# Patient Record
Sex: Female | Born: 1970 | Race: Black or African American | Hispanic: No | Marital: Married | Smoking: Never smoker
Health system: Southern US, Community
[De-identification: ages and names within clinical notes are randomized; demographics above are authoritative.]

## PROBLEM LIST (undated history)

## (undated) DIAGNOSIS — Z86718 Personal history of other venous thrombosis and embolism: Secondary | ICD-10-CM

## (undated) DIAGNOSIS — F32A Depression, unspecified: Secondary | ICD-10-CM

## (undated) DIAGNOSIS — D573 Sickle-cell trait: Secondary | ICD-10-CM

## (undated) DIAGNOSIS — G629 Polyneuropathy, unspecified: Secondary | ICD-10-CM

## (undated) DIAGNOSIS — C50411 Malignant neoplasm of upper-outer quadrant of right female breast: Secondary | ICD-10-CM

## (undated) DIAGNOSIS — D649 Anemia, unspecified: Secondary | ICD-10-CM

## (undated) DIAGNOSIS — Z85528 Personal history of other malignant neoplasm of kidney: Secondary | ICD-10-CM

## (undated) DIAGNOSIS — F329 Major depressive disorder, single episode, unspecified: Secondary | ICD-10-CM

## (undated) DIAGNOSIS — I890 Lymphedema, not elsewhere classified: Secondary | ICD-10-CM

## (undated) HISTORY — PX: MASTECTOMY: SHX3

## (undated) HISTORY — PX: TONSILLECTOMY: SUR1361

## (undated) HISTORY — PX: OTHER SURGICAL HISTORY: SHX169

## (undated) HISTORY — DX: Malignant neoplasm of upper-outer quadrant of right female breast: C50.411

## (undated) HISTORY — PX: TUBAL LIGATION: SHX77

---

## 2000-05-19 ENCOUNTER — Other Ambulatory Visit: Admission: RE | Admit: 2000-05-19 | Discharge: 2000-05-19 | Payer: Self-pay | Admitting: *Deleted

## 2001-01-01 ENCOUNTER — Inpatient Hospital Stay (HOSPITAL_COMMUNITY): Admission: AD | Admit: 2001-01-01 | Discharge: 2001-01-03 | Payer: Self-pay | Admitting: Gynecology

## 2001-02-20 ENCOUNTER — Other Ambulatory Visit: Admission: RE | Admit: 2001-02-20 | Discharge: 2001-02-20 | Payer: Self-pay | Admitting: *Deleted

## 2005-04-13 ENCOUNTER — Emergency Department (HOSPITAL_COMMUNITY): Admission: EM | Admit: 2005-04-13 | Discharge: 2005-04-13 | Payer: Self-pay | Admitting: Emergency Medicine

## 2005-04-15 ENCOUNTER — Ambulatory Visit (HOSPITAL_COMMUNITY): Admission: RE | Admit: 2005-04-15 | Discharge: 2005-04-15 | Payer: Self-pay | Admitting: Obstetrics

## 2005-08-21 ENCOUNTER — Ambulatory Visit (HOSPITAL_COMMUNITY): Admission: RE | Admit: 2005-08-21 | Discharge: 2005-08-21 | Payer: Self-pay | Admitting: Obstetrics & Gynecology

## 2005-09-17 ENCOUNTER — Inpatient Hospital Stay (HOSPITAL_COMMUNITY): Admission: AD | Admit: 2005-09-17 | Discharge: 2005-09-20 | Payer: Self-pay | Admitting: Obstetrics & Gynecology

## 2007-10-06 ENCOUNTER — Emergency Department (HOSPITAL_COMMUNITY): Admission: EM | Admit: 2007-10-06 | Discharge: 2007-10-06 | Payer: Self-pay | Admitting: Family Medicine

## 2010-12-14 ENCOUNTER — Encounter: Payer: Self-pay | Admitting: Obstetrics & Gynecology

## 2010-12-26 ENCOUNTER — Other Ambulatory Visit: Payer: Self-pay | Admitting: Obstetrics & Gynecology

## 2010-12-26 ENCOUNTER — Encounter (INDEPENDENT_AMBULATORY_CARE_PROVIDER_SITE_OTHER): Payer: Medicaid Other | Admitting: Obstetrics and Gynecology

## 2010-12-26 DIAGNOSIS — Z124 Encounter for screening for malignant neoplasm of cervix: Secondary | ICD-10-CM

## 2010-12-26 DIAGNOSIS — Z3009 Encounter for other general counseling and advice on contraception: Secondary | ICD-10-CM

## 2011-01-04 NOTE — H&P (Signed)
Phyllis Pruitt, DUGGAR NO.:  [id number]  MEDICAL RECORD NO.:  [id number]           PATIENT TYPE:  A  LOCATION:  [facility]                   FACILITY:  [facility]  PHYSICIAN:  Scheryl Darter, MD       DATE OF BIRTH:  12/19/1970  DATE OF SERVICE:                          PRE-OP HISTORY & PHYSICAL  The patient wishes to schedule permanent sterilization.  The patient is a 40 year old black female, gravida 4, para 4-0-0-4, last menstrual period December 22, 2010, with regular menses, uses condoms for birth control.  She wants to schedule a permanent sterilization via laparoscopy.  She has used Mirena in the past and does not wish to have that again.  She does not plan future childbearing.  Her children are between ages of 78 and 30.  PAST MEDICAL HISTORY:  None.  PAST SURGICAL HISTORY:  None.  FAMILY HISTORY:  Unremarkable.  SOCIAL HISTORY:  The patient exercises regularly.  No smoking, drinking, or alcohol use.  She works as a Technical brewer.  ALLERGIES:  No known drug allergies.  No latex allergy.  MEDICATIONS: 1. Iron 18 mg a day. 2. Multivitamins daily, 3. Flaxseed oil one capsule daily.  REVIEW OF SYSTEMS:  No bleeding, abnormal discharge, or pain.  She had some voluntary weight loss over the last year of about 15 pounds.  PHYSICAL EXAMINATION:  GENERAL:  In no acute distress.  Normal affect. VITAL SIGNS:  Weight 170 pounds, height 65 inches, blood pressure 140/86, pulse 99, temperature 98.2. CHEST:  Clear. HEART:  Regular rhythm. BREASTS:  Symmetric.  No masses, tenderness, or axillary lymphadenopathy. ABDOMEN:  Flat, soft, nontender.  No mass. GENITOURINARY:  External genitalia, vagina, and cervix appeared normal. Pap was done.  Uterus normal size, nontender.  No mass. EXTREMITIES:  No swelling or deformity.  IMPRESSION:  Undesired fertility.  PLAN:  We discussed laparoscopic tubal sterilization.  The risk of failure 1:200 including risk of ectopic  pregnancy was discussed.  The risks of anesthesia, bleeding, infection, pain, or bowel or urinary tract damage were discussed.  Her questions were answered.  The procedure will be scheduled as an outpatient.  She will sign consent for Medicaid today.     Scheryl Darter, MD    JA/MEDQ  D:  12/26/2010  T:  12/26/2010  Job:  [id number]

## 2011-02-13 ENCOUNTER — Ambulatory Visit (HOSPITAL_COMMUNITY)
Admission: RE | Admit: 2011-02-13 | Discharge: 2011-02-13 | Disposition: A | Discharge status: Home / Self Care | Payer: Medicaid Other | Source: Ambulatory Visit | Attending: Obstetrics & Gynecology | Admitting: Obstetrics & Gynecology

## 2011-02-13 DIAGNOSIS — Z302 Encounter for sterilization: Secondary | ICD-10-CM

## 2011-02-13 LAB — CBC
HCT: 36.4 % (ref 36.0–46.0)
Hemoglobin: 12.2 g/dL (ref 12.0–15.0)
MCH: 27.5 pg (ref 26.0–34.0)
MCV: 82 fL (ref 78.0–100.0)
Platelets: 252 10*3/uL (ref 150–400)
RBC: 4.44 MIL/uL (ref 3.87–5.11)
WBC: 6.4 10*3/uL (ref 4.0–10.5)

## 2011-02-13 LAB — SURGICAL PCR SCREEN
MRSA, PCR: NEGATIVE
Staphylococcus aureus: NEGATIVE

## 2011-02-22 NOTE — Op Note (Signed)
  Phyllis Pruitt, Phyllis Pruitt NO.:  [id number]  MEDICAL RECORD NO.:  [id number]           PATIENT TYPE:  O  LOCATION:  [facility]                          [facility]  PHYSICIAN:  Scheryl Darter, MD       DATE OF BIRTH:  24-Jun-1971  DATE OF PROCEDURE:  02/13/2011 DATE OF DISCHARGE:                              OPERATIVE REPORT   PROCEDURE:  Laparoscopic bilateral tubal ligation.  PREOPERATIVE DIAGNOSIS:  Undesired fertility.  POSTOPERATIVE DIAGNOSIS:  Tubal sterilization.  SURGEON:  Scheryl Darter, MD  ANESTHESIA:  General.  ESTIMATED BLOOD LOSS:  Minimal.  SPECIMENS:  None.  COMPLICATIONS:  None.  DRAINS:  None.  COUNTS:  Correct.  OPERATIVE COURSE:  The patient gave written consent for laparoscopic bilateral tubal ligation.  The patient identification was confirmed and she was brought to the OR, and general anesthesia was induced.  She was placed in dorsal lithotomy position.  Abdomen and perineum and vaginawere sterilely prepped and draped, and the bladder was drained with a red rubber catheter.  Hulka tenaculum was placed on the cervix.  A #11 blade was used to make a transverse infraumbilical skin incision about 1.5 cm across.  The abdominal wall was elevated and Veress needle was placed, and CO2 was insufflated.  Needle was then removed, and the 11-mm trocar was placed through the incision.  Laparoscope was inserted with video camera in use.  Panoramic view of the pelvis was obtained.  The uterus was manipulated with a Hulka tenaculum.  Tubes and ovaries appeared normal.  Filshie clip was placed on the right tube with good positioning seen after identifying the distal fimbriated end.  Same was done on the left tube. The instruments were removed and CO2 was released.  A 0 Vicryl suture was placed in the fascia at the umbilicus, and skin was closed with interrupted subcuticular sutures of 4-0 Vicryl.  Sterile dressing was applied.  The patient tolerated the  procedure well without complications.  She was brought in stable condition to the recovery room.     Scheryl Darter, MD     JA/MEDQ  D:  02/13/2011  T:  02/13/2011  Job:  [id number]  Electronically Signed by Scheryl Darter MD on 02/22/2011 10:29:35 AM

## 2011-03-01 NOTE — H&P (Signed)
[facility]  Patient:    Phyllis Pruitt, HERST                       MRN: [id number] Adm. Date:  12/29/00 Attending:  Katy Fitch, M.D.                         History and Physical  HISTORY:                      The patient is a 40 year old, G3, P2, at 41-5/7 weeks, who is admitted for induction.  The patient was checked in the office on December 29, 2000, and noted to be 2 cm, 50% effaced, and -2.  The patient has had no complaints of decreased fetal movement, vaginal bleeding, rupture of membranes, headache, nausea, vomiting, or change in bowel or bladder habits.  PAST MEDICAL HISTORY:         Sickle cell trait.  PAST SURGICAL HISTORY:        None.  PAST OBSTETRICAL HISTORY:     Two term deliveries at 42 weeks, one 6 pounds 13 ounces, one 8 pounds 1 ounce, by vaginal.  PAST GYNECOLOGICAL HISTORY:   No history of abnormal Pap smear or STDs.  MEDICATIONS:                  Prenatal vitamins.  ALLERGIES:                    None.  SOCIAL HISTORY:               Without any tobacco, alcohol, or drugs.  FAMILY HISTORY:               Without any epithelial cancers.  PRENATAL LABORATORY:          O positive, rubella immune, GBS negative.  PHYSICAL EXAMINATION:  VITAL SIGNS:                  Blood pressure 110/70.  HEENT:                        Throat clear.  LUNGS:                        Clear to auscultation bilaterally.  HEART:                        Regular rate and rhythm without any murmurs, rubs, or gallops.  ABDOMEN:                      Gravid, nontender.  Estimated fetal weight 7 pounds 12 ounces.  PELVIC:                       Cervix 2, 50, -2.  ASSESSMENT/PLAN:              A 40 year old G3, P2 at 42-5/7 weeks, induction for postdates.  Will have Pitocin and artificial rupture of membranes. DD:  12/29/00 TD:  12/29/00 Job: [id number]

## 2011-03-01 NOTE — H&P (Signed)
Phyllis Pruitt, Phyllis Pruitt             ACCOUNT NO.:  [id number]   MEDICAL RECORD NO.:  [id number]          PATIENT TYPE:  INP   LOCATION:  9167                          [facility]   PHYSICIAN:  Roseanna Rainbow, M.D.DATE OF BIRTH:  [birth year]   DATE OF ADMISSION:  09/17/2005  DATE OF DISCHARGE:                                HISTORY & PHYSICAL   CHIEF COMPLAINT:  The patient is a 40 year old para 3 with an estimated date  of confinement of September 21, 2005, with an intrauterine pregnancy at 39+  weeks, for induction of labor.   HISTORY OF PRESENT ILLNESS:  The patient has gestational diabetes, well  controlled on a diet. On the day of admission she presented to the office  and was found to have elevated blood pressures of 140/90. She did not have  any other symptoms consistent with severe PIH.   RISK FACTORS:  Please see the above as well as advanced maternal age,  anemia, sickle cell trait. There is a questionable bladder mass with  secondary hematuria and she is followed by a urologist for this problem. GBS  positive.   LABORATORY DATA:  Blood type is A positive, antibody screen negative.  Hemoglobin 8.9, platelet count 283,000. Quad screen within normal limits.  RPR nonreactive. Rubella immune. Sickle cell positive. Urine culture and  sensitivity - no growth.   ALLERGIES:  No known drug allergies.   MEDICATIONS:  Prenatal vitamins.   PAST GYN HISTORY:  No history of obstetrical or gynecological surgery.   PAST MEDICAL HISTORY:  No significant history of medical diseases.   PAST SURGICAL HISTORY:  No previous surgery.   SOCIAL HISTORY:  Does not give any significant history of alcohol usage. Has  no significant smoking history.   FAMILY HISTORY:  No illnesses known.   PAST OBSTETRIC HISTORY:  She is status post 3 previous normal spontaneous  vaginal deliveries.   PHYSICAL EXAMINATION:  VITAL SIGNS:  Stable. Afebrile. Blood pressure  140/90.  GENERAL:   Well-developed, well-nourished, in no apparent distress.  ABDOMEN:  Gravid.  STERILE VAGINAL EXAM:  In the office the cervix is 1-2 cm long, the  presenting part is high, the membranes were stretched. Doppler fetal heart  tones in the office: fetal heart rate 130's.   ASSESSMENT:  Multipara at term with gestational diabetes, diet controlled,  with rule out pregnancy induced hypertension, borderline Bishop's score, GBS  positive.   PLAN:  Admission. Induction of labor.  Will start with cervical ripening.      Roseanna Rainbow, M.D.  Electronically Signed     LAJ/MEDQ  D:  09/17/2005  T:  09/17/2005  Job:  [id number]

## 2011-03-01 NOTE — Discharge Summary (Signed)
[facility]  Patient:    Phyllis Pruitt, Phyllis Pruitt                     MRN: [id number] Adm. Date:  [id number] Disc. Date: [id number] Attending:  Wetzel Bjornstad Dictator:   Antony Contras, [facility]                           Discharge Summary  DISCHARGE DIAGNOSES:          1. Intrauterine pregnancy at 41-5/7 weeks.                               2. Positive sickle cell trait.  PROCEDURES:                   1. Induction of labor.                               2. Normal spontaneous vaginal delivery with                                  repair of second-degree midline episiotomy.  HISTORY OF PRESENT ILLNESS:   The patient is a 40 year old gravida 3, para 2-0-0-2, with LMP March 18, 2000, [facility] December 23, 2000.  Prenatal risk factors include a history of sickle cell trait.  PRENATAL LABORATORY DATA:     Blood type A positive, antibody screen negative. Sickle cell positive.  RPR, HBsAg, HIV nonreactive.  Rubella immune.  MSAFP normal.  GBS negative.  HOSPITAL COURSE:              The patient was admitted for induction of labor at 41-5/7 weeks.  On admission she was 2 cm, 50%, and -2.  Induction was initiated with artificial rupture of membranes and Pitocin.  She progressed to complete dilatation and delivered an Apgars 3, 4, and 8 female infant over a midline episiotomy.  Weight was 8 pounds 8 ounces.  Infant did have poor respiratory efforts, and the pediatric team was available for resuscitation. Also, there was a nuchal cord x 1 at delivery which was reduced.  Postpartum the patient did have a temperature elevation to 101, but then had normal temperature readings after that time.  CBC:  Hematocrit 27.6, hemoglobin 9.9, WBC 18.6, platelets 182.  She was able to be discharged with the baby in satisfactory condition on the second postpartum day.  FOLLOW-UP:                    In six weeks.  MEDICATIONS:                  Continue with prenatal vitamins and iron. Motrin  for pain. DD:  01/19/01 TD:  01/20/01 Job: [id number]

## 2011-03-13 ENCOUNTER — Ambulatory Visit: Payer: Medicaid Other | Admitting: Obstetrics & Gynecology

## 2011-03-13 DIAGNOSIS — Z09 Encounter for follow-up examination after completed treatment for conditions other than malignant neoplasm: Secondary | ICD-10-CM

## 2011-03-14 NOTE — Group Therapy Note (Signed)
NAMECARLITA, WHITCOMB NO.:  [id number]  MEDICAL RECORD NO.:  [id number]           PATIENT TYPE:  A  LOCATION:  [facility]                   FACILITY:  [facility]  PHYSICIAN:  Allie Bossier, MD        DATE OF BIRTH:  25-Sep-1971  DATE OF SERVICE:                                 CLINIC NOTE  [name] is a 40 year old lady who is now 4 weeks postop status post tubal ligation via laparoscopy.  She has had intercourse since her surgery and has had no pain.  Her bowel and bladder function is normal. Please note that she had her tubal done at that time she was on her menses.  Her only complaint today is that of a left "ear infection" x4 days.  On exam, her laparoscopic umbilical incision is well healed.  Her both canals of her ear are slightly erythematous but the TM on her left ear is very cloudy consistent with otitis media.  I am treating her with Augmentin 875 mg b.i.d. for 7 days.  She will come back for her Pap smear which will be due in a year or sooner as necessary.     Allie Bossier, MD    MCD/MEDQ  D:  03/13/2011  T:  03/14/2011  Job:  [id number]

## 2011-07-26 ENCOUNTER — Inpatient Hospital Stay (INDEPENDENT_AMBULATORY_CARE_PROVIDER_SITE_OTHER)
Admission: RE | Admit: 2011-07-26 | Discharge: 2011-07-26 | Disposition: A | Discharge status: Home / Self Care | Payer: Self-pay | Source: Ambulatory Visit | Attending: Emergency Medicine | Admitting: Emergency Medicine

## 2011-07-26 DIAGNOSIS — M799 Soft tissue disorder, unspecified: Secondary | ICD-10-CM

## 2011-07-26 DIAGNOSIS — S060X0A Concussion without loss of consciousness, initial encounter: Secondary | ICD-10-CM

## 2015-01-09 ENCOUNTER — Other Ambulatory Visit: Payer: Self-pay

## 2015-01-09 DIAGNOSIS — R5381 Other malaise: Secondary | ICD-10-CM

## 2015-01-24 ENCOUNTER — Other Ambulatory Visit: Payer: Self-pay | Admitting: Radiology

## 2015-01-24 DIAGNOSIS — C50911 Malignant neoplasm of unspecified site of right female breast: Secondary | ICD-10-CM

## 2015-01-25 ENCOUNTER — Telehealth: Payer: Self-pay | Admitting: *Deleted

## 2015-01-25 NOTE — Telephone Encounter (Signed)
Left vm for pt to return call to schedule [facility] on 4/20

## 2015-01-27 ENCOUNTER — Telehealth: Payer: Self-pay | Admitting: *Deleted

## 2015-01-27 ENCOUNTER — Encounter: Payer: Self-pay | Admitting: *Deleted

## 2015-01-27 DIAGNOSIS — C50411 Malignant neoplasm of upper-outer quadrant of right female breast: Secondary | ICD-10-CM

## 2015-01-27 HISTORY — DX: Malignant neoplasm of upper-outer quadrant of right female breast: C50.411

## 2015-01-27 NOTE — Telephone Encounter (Signed)
Confirmed [facility] for 02/01/15 at 0800 .  Instructions and contact information given.

## 2015-01-30 ENCOUNTER — Ambulatory Visit
Admission: RE | Admit: 2015-01-30 | Discharge: 2015-01-30 | Disposition: A | Discharge status: Home / Self Care | Payer: 59 | Source: Ambulatory Visit | Attending: Radiology | Admitting: Radiology

## 2015-01-30 DIAGNOSIS — C50911 Malignant neoplasm of unspecified site of right female breast: Secondary | ICD-10-CM

## 2015-01-30 MED ORDER — GADOBENATE DIMEGLUMINE 529 MG/ML IV SOLN
16.0000 mL | Freq: Once | INTRAVENOUS | Status: AC | PRN
  Administered 2015-01-30: 16 mL via INTRAVENOUS

## 2015-01-31 ENCOUNTER — Telehealth: Payer: Self-pay | Admitting: *Deleted

## 2015-01-31 NOTE — Telephone Encounter (Signed)
Received notification that pt will not be able to come to [facility] d/t insurance. Pt must meet with PCP who then sends a referral over prior to her being seen. R/s pt for [facility] on 02/08/15 at 8:00AM.

## 2015-02-01 ENCOUNTER — Ambulatory Visit: Payer: Medicaid Other

## 2015-02-01 ENCOUNTER — Ambulatory Visit: Payer: Medicaid Other | Admitting: Oncology

## 2015-02-01 ENCOUNTER — Ambulatory Visit: Payer: 59 | Admitting: Radiation Oncology

## 2015-02-01 ENCOUNTER — Ambulatory Visit: Payer: Medicaid Other | Admitting: Physical Therapy

## 2015-02-01 ENCOUNTER — Other Ambulatory Visit: Payer: Medicaid Other

## 2015-02-08 ENCOUNTER — Ambulatory Visit: Payer: 59

## 2015-02-08 ENCOUNTER — Telehealth: Payer: Self-pay | Admitting: Hematology and Oncology

## 2015-02-08 ENCOUNTER — Ambulatory Visit: Payer: Self-pay | Admitting: Surgery

## 2015-02-08 ENCOUNTER — Ambulatory Visit (HOSPITAL_BASED_OUTPATIENT_CLINIC_OR_DEPARTMENT_OTHER): Payer: 59 | Admitting: Hematology and Oncology

## 2015-02-08 ENCOUNTER — Other Ambulatory Visit: Payer: Self-pay | Admitting: Surgery

## 2015-02-08 ENCOUNTER — Encounter (INDEPENDENT_AMBULATORY_CARE_PROVIDER_SITE_OTHER): Payer: Self-pay

## 2015-02-08 ENCOUNTER — Encounter: Payer: Self-pay | Admitting: Medical Oncology

## 2015-02-08 ENCOUNTER — Encounter: Payer: Self-pay | Admitting: Skilled Nursing Facility1

## 2015-02-08 ENCOUNTER — Ambulatory Visit: Payer: 59 | Attending: Surgery | Admitting: Physical Therapy

## 2015-02-08 ENCOUNTER — Encounter: Payer: Self-pay | Admitting: Physical Therapy

## 2015-02-08 ENCOUNTER — Other Ambulatory Visit (HOSPITAL_BASED_OUTPATIENT_CLINIC_OR_DEPARTMENT_OTHER): Payer: 59

## 2015-02-08 ENCOUNTER — Encounter: Payer: Self-pay | Admitting: Hematology and Oncology

## 2015-02-08 ENCOUNTER — Ambulatory Visit
Admission: RE | Admit: 2015-02-08 | Discharge: 2015-02-08 | Disposition: A | Discharge status: Home / Self Care | Payer: 59 | Source: Ambulatory Visit | Attending: Radiation Oncology | Admitting: Radiation Oncology

## 2015-02-08 ENCOUNTER — Encounter: Payer: Self-pay | Admitting: *Deleted

## 2015-02-08 VITALS — BP 151/80 | HR 82 | Temp 98.1°F | Resp 18 | Ht 65.5 in | Wt 178.9 lb

## 2015-02-08 DIAGNOSIS — C50811 Malignant neoplasm of overlapping sites of right female breast: Secondary | ICD-10-CM

## 2015-02-08 DIAGNOSIS — C50411 Malignant neoplasm of upper-outer quadrant of right female breast: Secondary | ICD-10-CM | POA: Insufficient documentation

## 2015-02-08 DIAGNOSIS — R293 Abnormal posture: Secondary | ICD-10-CM | POA: Insufficient documentation

## 2015-02-08 DIAGNOSIS — C773 Secondary and unspecified malignant neoplasm of axilla and upper limb lymph nodes: Secondary | ICD-10-CM

## 2015-02-08 DIAGNOSIS — Z9189 Other specified personal risk factors, not elsewhere classified: Secondary | ICD-10-CM | POA: Diagnosis not present

## 2015-02-08 DIAGNOSIS — Z171 Estrogen receptor negative status [ER-]: Secondary | ICD-10-CM

## 2015-02-08 LAB — COMPREHENSIVE METABOLIC PANEL (CC13)
ALT: 11 U/L (ref 0–55)
AST: 14 U/L (ref 5–34)
Albumin: 3.6 g/dL (ref 3.5–5.0)
Alkaline Phosphatase: 61 U/L (ref 40–150)
Anion Gap: 10 mEq/L (ref 3–11)
BUN: 4.6 mg/dL — ABNORMAL LOW (ref 7.0–26.0)
CALCIUM: 8.9 mg/dL (ref 8.4–10.4)
CHLORIDE: 105 meq/L (ref 98–109)
CO2: 21 mEq/L — ABNORMAL LOW (ref 22–29)
Creatinine: 0.8 mg/dL (ref 0.6–1.1)
GLUCOSE: 100 mg/dL (ref 70–140)
Potassium: 3.9 mEq/L (ref 3.5–5.1)
Sodium: 136 mEq/L (ref 136–145)
Total Bilirubin: 0.31 mg/dL (ref 0.20–1.20)
Total Protein: 7.3 g/dL (ref 6.4–8.3)

## 2015-02-08 LAB — CBC WITH DIFFERENTIAL/PLATELET
BASO%: 0.5 % (ref 0.0–2.0)
Basophils Absolute: 0 10*3/uL (ref 0.0–0.1)
EOS%: 0.8 % (ref 0.0–7.0)
Eosinophils Absolute: 0.1 10*3/uL (ref 0.0–0.5)
HCT: 36.1 % (ref 34.8–46.6)
HEMOGLOBIN: 11.7 g/dL (ref 11.6–15.9)
LYMPH%: 20.3 % (ref 14.0–49.7)
MCH: 26.2 pg (ref 25.1–34.0)
MCHC: 32.5 g/dL (ref 31.5–36.0)
MCV: 80.7 fL (ref 79.5–101.0)
MONO#: 0.5 10*3/uL (ref 0.1–0.9)
MONO%: 7.8 % (ref 0.0–14.0)
NEUT#: 4.8 10*3/uL (ref 1.5–6.5)
NEUT%: 70.6 % (ref 38.4–76.8)
Platelets: 271 10*3/uL (ref 145–400)
RBC: 4.48 10*6/uL (ref 3.70–5.45)
RDW: 14.9 % — ABNORMAL HIGH (ref 11.2–14.5)
WBC: 6.8 10*3/uL (ref 3.9–10.3)
lymph#: 1.4 10*3/uL (ref 0.9–3.3)

## 2015-02-08 MED ORDER — PROCHLORPERAZINE MALEATE 10 MG PO TABS: 10.0000 mg | ORAL_TABLET | Freq: Four times a day (QID) | ORAL | Status: DC | PRN

## 2015-02-08 MED ORDER — LIDOCAINE-PRILOCAINE 2.5-2.5 % EX CREA: TOPICAL_CREAM | CUTANEOUS | Status: DC

## 2015-02-08 MED ORDER — ONDANSETRON HCL 8 MG PO TABS: 8.0000 mg | ORAL_TABLET | Freq: Two times a day (BID) | ORAL | Status: DC | PRN

## 2015-02-08 MED ORDER — DEXAMETHASONE 4 MG PO TABS: ORAL_TABLET | ORAL | Status: DC

## 2015-02-08 MED ORDER — LORAZEPAM 0.5 MG PO TABS: 0.5000 mg | ORAL_TABLET | Freq: Every day | ORAL | Status: DC

## 2015-02-08 NOTE — Therapy (Signed)
[place], [place], [address] Phone: [phone]   Fax:  [phone]  Physical Therapy Evaluation  Patient Details  Name: Phyllis Pruitt MRN: [id number] Date of Birth: 12-16-1970 Referring Provider:  Alphonsa Overall, MD  Encounter Date: 02/08/2015      PT End of Session - 02/08/15 1314    Visit Number 1   Number of Visits 1   PT Start Time 1030   PT Stop Time 1052   PT Time Calculation (min) 22 min   Activity Tolerance Patient tolerated treatment well   Behavior During Therapy [facility] for tasks assessed/performed      Past Medical History  Diagnosis Date  . Breast cancer of upper-outer quadrant of right female breast 01/27/2015    Past Surgical History  Procedure Laterality Date  . Tubal ligation    . Tonsillectomy    . [place] teeth extraction      There were no vitals filed for this visit.  Visit Diagnosis:  Carcinoma of upper-outer quadrant of right female breast - Plan: PT plan of care cert/re-cert  At risk for lymphedema - Plan: PT plan of care cert/re-cert  Abnormal posture - Plan: PT plan of care cert/re-cert      Subjective Assessment - 02/08/15 1245    Subjective Patient was seen today for a baseline assessment of her newly diagnosed right breast cancer.   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  Mass measures 1.8 cm in the right upper outer quadrant.  She also has a positive right axillary lymph node.     Patient Stated Goals Reduce lymphedema risk and learn post op shoulder ROM HEP   Currently in Pain? No/denies            [facility] PT Assessment - 02/08/15 0001    Assessment   Medical Diagnosis Right breast cancer   Onset Date 01/19/15   Precautions   Precautions Other (comment)  Active breast cancer   Restrictions   Weight Bearing Restrictions No    Balance Screen   Has the patient fallen in the past 6 months No   Has the patient had a decrease in activity level because of a fear of falling?  No   Is the patient reluctant to leave their home because of a fear of falling?  No   Home Environment   Living Enviornment Private residence   Living Arrangements Spouse/significant other;Children  51 and 33 y.o sons   Available Help at Discharge Family   Prior Function   Level of Independence Independent with basic ADLs   Vocation Full time employment   Administrator   Leisure She walks or does zumba 20-30 min 5x/wk   Cognition   Overall Cognitive Status Within Functional Limits for tasks assessed   Posture/Postural Control   Posture/Postural Control Postural limitations   Postural Limitations Rounded Shoulders;Forward head   ROM / Strength   AROM / PROM / Strength AROM;Strength   AROM   AROM Assessment Site Shoulder   Right/Left Shoulder Right;Left   Right Shoulder Extension 61 Degrees   Right Shoulder Flexion 157 Degrees   Right Shoulder ABduction 173 Degrees   Right Shoulder Internal Rotation 67 Degrees   Right Shoulder External Rotation 83 Degrees   Left Shoulder Extension 51 Degrees   Left Shoulder Flexion 152 Degrees   Left Shoulder ABduction  167 Degrees   Left Shoulder Internal Rotation 68 Degrees   Left Shoulder External Rotation 83 Degrees   Strength   Overall Strength Within functional limits for tasks performed           LYMPHEDEMA/ONCOLOGY QUESTIONNAIRE - 02/08/15 1256    Type   Cancer Type Right breast   Lymphedema Assessments   Lymphedema Assessments Upper extremities   Right Upper Extremity Lymphedema   10 cm Proximal to Olecranon Process 28.8 cm   Olecranon Process 25.3 cm   10 cm Proximal to Ulnar Styloid Process 21.2 cm   Just Proximal to Ulnar Styloid Process 15.6 cm   Across Hand at [employer] 19.4 cm   At [place] of 2nd Digit 6 cm   Left Upper Extremity Lymphedema   10 cm  Proximal to Olecranon Process 29.2 cm   Olecranon Process 25.4 cm   10 cm Proximal to Ulnar Styloid Process 21.5 cm   Just Proximal to Ulnar Styloid Process 16.3 cm   Across Hand at [employer] 19.3 cm   At [place] of 2nd Digit 6.1 cm      Patient was instructed today in a home exercise program today for post op shoulder range of motion. These included active assist shoulder flexion in sitting, scapular retraction, wall walking with shoulder abduction, and hands behind head external rotation.  She was encouraged to do these twice a day, holding 3 seconds and repeating 5 times when permitted by her physician.         PT Education - 02/08/15 1301    Education provided Yes   Education Details Lymphedema risk reduction and post op shoulder ROM HEP   Person(s) Educated Patient;Spouse   Methods Explanation;Demonstration;Handout   Comprehension Verbalized understanding;Returned demonstration              Breast Clinic Goals - 02/08/15 1321    Patient will be able to verbalize understanding of pertinent lymphedema risk reduction practices relevant to her diagnosis specifically related to skin care.   Time 1   Period Days   Status Achieved   Patient will be able to return demonstrate and/or verbalize understanding of the post-op home exercise program related to regaining shoulder range of motion.   Time 1   Period Days   Status Achieved   Patient will be able to verbalize understanding of the importance of attending the postoperative After Breast Cancer Class for further lymphedema risk reduction education and therapeutic exercise.   Time 1   Period Days   Status Achieved              Plan - 02/08/15 1314    Clinical Impression Statement Patient was diagnosed 01/19/15 with Triple negative right breast cancer.  Her mass measures 1.8 cm in the upper outer quadrant with a Ki67 of 70%.  She also had a biopsied positive right axillary lymph node.  She is planning to have  neoadjuvant chemotherapy followed by a right mastectomy and reconstruction with a sentinel node biopsy or axillary lymph node dissection.  She may undergo radiation treatment and anti-estrogen therapy.  She will benefit from post op PT to regain shoulder ROM and strength and reduce lymphedema risk.   Pt will benefit from skilled therapeutic intervention in order to improve on the following deficits Decreased range of motion;Increased edema;Decreased knowledge of precautions;Pain;Impaired UE functional use;Decreased strength   Rehab Potential Good   Clinical Impairments Affecting Rehab Potential none   PT Frequency One time visit  PT Treatment/Interventions Patient/family education;Therapeutic exercise   Consulted and Agree with Plan of Care Patient;Family member/caregiver   Family Member Consulted fiancee     Patient will follow up at outpatient cancer rehab if needed following surgery.  If the patient requires physical therapy at that time, a specific plan will be dictated and sent to the referring physician for approval. The patient was educated today on appropriate basic range of motion exercises to begin post operatively and the importance of attending the After Breast Cancer class following surgery.  Patient was educated today on lymphedema risk reduction practices as it pertains to recommendations that will benefit the patient immediately following surgery.  She verbalized good understanding.  No additional physical therapy is indicated at this time.       Problem List Patient Active Problem List   Diagnosis Date Noted  . Breast cancer of upper-outer quadrant of right female breast 01/27/2015    Annia Friendly, PT 02/08/2015, 1:23 PM  [place], [place], [address] Phone: [phone]   Fax:  [phone]

## 2015-02-08 NOTE — Progress Notes (Signed)
Radiation Oncology         [phone]) [phone] ________________________________  Initial outpatient Consultation - Date: 02/08/2015   Name: Phyllis Pruitt MRN: [id number]   DOB: 09-04-1971  REFERRING PHYSICIAN: Excell Seltzer, MD  DIAGNOSIS AND STAGE: Stage III right breast cancer.  HISTORY OF PRESENT ILLNESS::Phyllis Pruitt is a 44 y.o. female  She presented with a pallable right breast mass with multiple confluent masses. She has had two biopsies at the 9 and 10 o'clock postions which showed a triple negative invasive ductal carcinoma. On clip films, these biopsies were found to be 7.73 cm apart. On the MRI, she was found to have multiple additional masses. Measuring a total of 13.7 by 4.6 by 4.6 cm. In addition, an abnormal axillary lymph node was found and biopsied-this was positive and was ER positive at 22%. Her left breast was negative and the left axilla was negative.    She has healed well since her biopsy, she is accompanied by her fiance they getting married on Saturday.  There is no family history of malignancy.   The patient's menarche occurred at age 81. She is still experiencing regular periods which last occurred on 01/18/15.  She has not used hormone replacement. The patient is GXP4, she had her first live birth at age 24. She is currently not pregnant and is not trying to get pregnant. She has not used birth control pills or hormone shots for contraception.   PREVIOUS RADIATION THERAPY: No  Past medical, social and family history were reviewed in the electronic chart. Review of symptoms was reviewed in the electronic chart. Medications were reviewed in the electronic chart.   PHYSICAL EXAM:   02/08/15  BP 151/80 mmHg  Pulse Rate 82  Resp 18  Temp 98.1 F (36.7 C)  Temp Source Oral  SpO2 100 %  Weight 178 lb 14.4 oz (81.149 kg)  Height 5' 5.5" (1.664 m)      She is pleasant and in no distress. She really has no palpable masses in the right breast but it is  abnormally bumpy as compared to the left. She has no palpable adenopathy bilaterally. She is alert and oriented x 3.    IMPRESSION: T3N1 Right breast cancer   PLAN:I spoke to the patient today regarding her diagnosis and options for treatment. We discussed the equivalence in terms of survival and local failure between mastectomy and breast conservation. We discussed the role of radiation in decreasing local failures in patients who undergo mastectomy and have risk factors for recurrence including positive lymph nodes and/or tumors over 5 cm and/or positive margins. We discussed the process of simulation and the placement tattoos. We discussed 6 weeks of treatment as an outpatient. We discussed the possibility of asymptomatic lung damage. We discussed the low likelihood of secondary malignancies. We discussed the possible side effects including but not limited to skin redness, fatigue, permanent skin darkening, and chest wall swelling. We discussed increased complications that can occur with reconstruction after radiation.   She will undergo staging studies and neoadjuvant chemotherapy. We will discuss enrollment in the Alliance trial following her chemotherapy.  She will see genetics and plastic surgery.  She met with physical therapy, dietician, survivorship navigator, and patient and family support team.   I spent 60 minutes  face to face with the patient and more than 50% of that time was spent in counseling and/or coordination of care.   ------------------------------------------------  Thea Silversmith, MD  This document serves as a record  of services personally performed by Lance Morin, MD. It was created on her behalf by Derek Mound, a trained medical scribe. The creation of this record is based on the scribe's personal observations and the provider's statements to them. This document has been checked and approved by the attending provider.

## 2015-02-08 NOTE — Progress Notes (Signed)
[facility] NOTE  Patient Care Team: [name], MD as PCP - General (Family Medicine) Excell Seltzer, MD as Consulting Physician (General Surgery) Chauncey Cruel, MD as Consulting Physician (Oncology) Thea Silversmith, MD as Consulting Physician (Radiation Oncology) Mauro Kaufmann, RN as Registered Nurse Rockwell Germany, RN as Registered Nurse  CHIEF COMPLAINTS/PURPOSE OF CONSULTATION:  Newly diagnosed breast cancer  HISTORY OF PRESENTING ILLNESS:  Phyllis Pruitt 44 y.o. female is here because of recent diagnosis of right-sided breast cancer. She palpated a lump in the right breast and went to see her family physician who obtained a mammogram that revealed multiple masses in the right breast. She then underwent a ultrasound which revealed right axillary lymph node. 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 The Ki-67 was 77% and 73% for the breast biopsies. It was grade 3. She was presented this morning at the multidisciplinary tumor board and she is here today to discuss the results and treatment plan. Her major complaint is pain in the right breast. She is also planned to get married next Saturday.  I reviewed her records extensively and collaborated the history with the patient.  SUMMARY OF ONCOLOGIC HISTORY:   Breast cancer of upper-outer quadrant of right female breast   01/27/2015 Initial Diagnosis Right breast 9:00 and 10:00 biopsy: Grade 3 IDC ER/PR negative, HER-2 negative, Ki-67 77% and 73%; the right axillary lymph node biopsy: EF 22% positive   01/30/2015 Breast MRI Right breast masses 9 and 10:00 position, additional irregular masses with non-mass enhancement upper outer quadrant with one mass extending into upper inner  quadrant, multifocal, multicentric 13.7 x 4.6 x 4.6 cm, malignant right axillary lymph node    In terms of breast cancer risk profile:  She menarched at early age of 50  She had 4 pregnancy, her first child was born at age 44 years  She has not received birth control pills.  She was never exposed to fertility medications or hormone replacement therapy.  She has no family history of Breast/GYN/GI cancer  MEDICAL HISTORY:  Past Medical History  Diagnosis Date  . Breast cancer of upper-outer quadrant of right female breast 01/27/2015    SURGICAL HISTORY: Past Surgical History  Procedure Laterality Date  . Tubal ligation    . Tonsillectomy    . Wisdome teeth extraction      SOCIAL HISTORY: History   Social History  . Marital Status: Divorced    Spouse Name: N/A  . Number of Children: N/A  . Years of Education: N/A   Occupational History  . Not on file.   Social History Main Topics  . Smoking status: Never Smoker   . Smokeless tobacco: Not on file  . Alcohol Use: No  . Drug Use: No  . Sexual Activity: Not on file   Other Topics Concern  . Not on file   Social History Narrative    FAMILY HISTORY: History reviewed. No pertinent family history.  ALLERGIES:  has No Known Allergies.  MEDICATIONS:  Current Outpatient Prescriptions  Medication Sig Dispense Refill  . beta carotene 30 MG capsule Take 30 mg by mouth daily.    Marland Kitchen ECHINACEA PO Take by mouth.    . Ginger, Zingiber officinalis, (GINGER ROOT PO)  Take by mouth.    . TURMERIC CURCUMIN PO Take by mouth.    Marland Kitchen UNABLE TO FIND jarro-dophilus(probiotics)    . UNABLE TO FIND Flintstone vitamins 4-5 times a week    . dexamethasone (DECADRON) 4 MG tablet Take 1 tablets by mouth once a day on the day after chemotherapy and then take 1 tablets two times a day for 2 days. Take with food. 30 tablet 1  . lidocaine-prilocaine (EMLA) cream Apply to affected area once 30 g 3  . LORazepam (ATIVAN) 0.5 MG tablet Take 1 tablet  (0.5 mg total) by mouth at bedtime. 30 tablet 0  . ondansetron (ZOFRAN) 8 MG tablet Take 1 tablet (8 mg total) by mouth 2 (two) times daily as needed. Start on the third day after chemotherapy. 30 tablet 1  . prochlorperazine (COMPAZINE) 10 MG tablet Take 1 tablet (10 mg total) by mouth every 6 (six) hours as needed (Nausea or vomiting). 30 tablet 1   No current facility-administered medications for this visit.    REVIEW OF SYSTEMS:   Constitutional: Denies fevers, chills or abnormal night sweats Eyes: Denies blurriness of vision, double vision or watery eyes Ears, nose, mouth, throat, and face: Denies mucositis or sore throat Respiratory: Denies cough, dyspnea or wheezes Cardiovascular: Denies palpitation, chest discomfort or lower extremity swelling Gastrointestinal:  Denies nausea, heartburn or change in bowel habits Skin: Denies abnormal skin rashes Lymphatics: Denies new lymphadenopathy or easy bruising Neurological:Denies numbness, tingling or new weaknesses Behavioral/Psych: Mood is stable, no new changes  Breast: Pain in the right breast with a palpable lump All other systems were reviewed with the patient and are negative.  PHYSICAL EXAMINATION: ECOG PERFORMANCE STATUS: 1 - Symptomatic but completely ambulatory  Filed Vitals:   02/08/15 0842  BP: 151/80  Pulse: 82  Temp: 98.1 F (36.7 C)  Resp: 18   Filed Weights   02/08/15 0842  Weight: 178 lb 14.4 oz (81.149 kg)    GENERAL:alert, no distress and comfortable SKIN: skin color, texture, turgor are normal, no rashes or significant lesions EYES: normal, conjunctiva are pink and non-injected, sclera clear OROPHARYNX:no exudate, no erythema and lips, buccal mucosa, and tongue normal  NECK: supple, thyroid normal size, non-tender, without nodularity LYMPH:  no palpable lymphadenopathy in the cervical, axillary or inguinal LUNGS: clear to auscultation and percussion with normal breathing effort HEART: regular rate &  rhythm and no murmurs and no lower extremity edema ABDOMEN:abdomen soft, non-tender and normal bowel sounds Musculoskeletal:no cyanosis of digits and no clubbing  PSYCH: alert & oriented x 3 with fluent speech NEURO: no focal motor/sensory deficits BREAST: Large mass in the right breast that is palpable in the upper outer quadrant and is tender. No palpable axillary or supraclavicular lymphadenopathy (exam performed in the presence of a chaperone)   LABORATORY DATA:  I have reviewed the data as listed Lab Results  Component Value Date   WBC 6.8 02/08/2015   HGB 11.7 02/08/2015   HCT 36.1 02/08/2015   MCV 80.7 02/08/2015   PLT 271 02/08/2015   Lab Results  Component Value Date   NA 136 02/08/2015   K 3.9 02/08/2015   CO2 21* 02/08/2015    RADIOGRAPHIC STUDIES: I have personally reviewed the radiological reports and agreed with the findings in the report.  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 After reviewing the pathology in detail, we proceeded to discuss the different treatment options between surgery, radiation, chemotherapy.  Recommendation: 1. Neoadjuvant chemotherapy with dose dense Adriamycin and Cytoxan every 2 weeks 4 followed by weekly Abraxane 12 2. Followed by mastectomy with or without lymph node dissection, ALLIANCE trail if  eligible 3. Followed by radiation 4. Followed by tamoxifen once daily 10 years because the lymph node was ER positive  Chemotherapy counseling:I have discussed the risks and benefits of chemotherapy including the risks of nausea/ vomiting, risk of infection from low WBC count, fatigue due to chemo or anemia, bruising or bleeding due to low platelets, mouth sores, loss/ change in taste and decreased appetite. Liver and kidney function will be monitored through out chemotherapy as abnormalities in liver and kidney function may be a side effect of treatment.  Cardiac dysfunction due to Adriamycin was discussed in detail. Risk of permanent bone marrow dysfunction and leukemia due to chemo were also discussed.  PREVENT trial: [id number]  Newly diagnosed breast cancer Stages 1-3 scheduled to receive anthracycline randomized to atorvastatin 40 mg or placebo once daily for 24 months along with 3 cardiac MRIs or 2 years paid for by the trial. I discussed the risks and benefits of atorvastatin including the risk of myopathy and elevation of liver function tests. I explained the randomization process and patient is interested in the trial. I provided her with literature to read and provided her with the contact with the clinical trials nurse to ask any questions regarding the trial.  Plan: 1. Genetic counseling 2. CT chest abdomen pelvis and bone scan for staging 3. Port placement 4. Echocardiogram 5. Chemotherapy class 6. Clinical trial discussion regarding PREVENT.  Because the lymph node was ER positive, she was ineligible for NRG BR 003 clinical trial for adding carboplatin. Patient is going to get married on 02/18/2015. We will hopefully start her treatment 02/20/2015.    All questions were answered. The patient knows to call the clinic with any problems, questions or concerns.    Rulon Eisenmenger, MD 11:56 AM

## 2015-02-08 NOTE — H&P (Signed)
Re:   Phyllis Pruitt DOB:   [birth year] MRN:   [id number]   [facility]  ASSESSMENT AND PLAN: 1.  Right breast cancer, advanced, UOQ  Plan neoadjuvant chemotherapy  2.  Power port placement  Will schedule this when available.  3.  Getting married 02/18/2015  No chief complaint on file.  REFERRING PHYSICIAN: [name], MD  HISTORY OF PRESENT ILLNESS: Phyllis Pruitt is a 44 y.o. (DOB: 1970-12-19)  AA  female whose primary care physician is [name], MD and comes to [facility] today for right breast cancer. Her fiancee, Phyllis Pruitt, is with her.  They are getting married 02/18/2015. Oncology - Pamelia Hoit and Michell Heinrich.  She got sick in March.  She noticed a lump in her right breast around 12/22/2014.  She had never had mammograms before.  She had no breast problems before she got sick.  She attributed the changes in her breast to her illness first.  I think that she had some dental issues.  She had mammograms through [facility] on 01/19/2015.  She had at least 6 lesions identified.  And her right axillary node was abnormal.  Her breast MRI - 01/30/2015 - Masses compatible with biopsy-proven invasive ductal carcinoma with clip artifact at the 9 and 10 o'clock positions of the right breast. Approximately 6 additional enhancing irregular masses with associated non mass enhancement in the upper-outer quadrant of the right breast with 1 mass extending into the upper inner quadrant.  The most anterior mass lies in the upper-outer right retroareolar region. The non mass enhancement extends into the outer lower quadrant. Findings likely represent multifocal, multicentric disease as these abnormalities measure approximately 13.7 x 4.6 x 4.6 cm together. Abnormal biopsy-proven malignant right axillary lymph node.  Pathology - IDC, Grade III, ER/PR - neg, Her2Neu neg, Ki67 - 77%.  Right axillary node positive.  This was her first mammogram.  She sees Dr. Jason Fila for GYN.  He  has also put her on Phentermine for weight loss.  This worked well, but she is off the Phentermine now.  Her LMP was 01/19/2015.  She is not on hormone meds.   I discussed the options for breast cancer treatment with the patient.   I discussed the surgical options of lumpectomy vs. mastectomy.  If mastectomy, there is the possibility of reconstruction.   I discussed the options of lymph node biopsy.  The treatment plan depends on the pathologic staging of the tumor and the patient's personal wishes.  The risks of surgery include, but are not limited to, bleeding, infection, the need for further surgery, and nerve injury.  The patient has been given literature on the treatment of breast cancer.   Because of the size of the cancer and mets to the axilla, I agree that she is best served with neoadjuvant chemo tx.  Power port placement - I discussed the indications and potential complications of the power port placement.  The primary complications of the power port, include, but are not limited to, bleeding, infection, nerve injury, thrombosis, and pneumothorax.   Past Medical History  Diagnosis Date  . Breast cancer of upper-outer quadrant of right female breast 01/27/2015      Past Surgical History  Procedure Laterality Date  . Tubal ligation    . Tonsillectomy    . Wisdome teeth extraction        Current Outpatient Prescriptions  Medication Sig Dispense Refill  . beta carotene 30 MG capsule Take 30 mg by mouth  daily.    . dexamethasone (DECADRON) 4 MG tablet Take 1 tablets by mouth once a day on the day after chemotherapy and then take 1 tablets two times a day for 2 days. Take with food. 30 tablet 1  . ECHINACEA PO Take by mouth.    . Ginger, Zingiber officinalis, (GINGER ROOT PO) Take by mouth.    . lidocaine-prilocaine (EMLA) cream Apply to affected area once 30 g 3  . LORazepam (ATIVAN) 0.5 MG tablet Take 1 tablet (0.5 mg total) by mouth at bedtime. 30 tablet 0  . ondansetron (ZOFRAN)  8 MG tablet Take 1 tablet (8 mg total) by mouth 2 (two) times daily as needed. Start on the third day after chemotherapy. 30 tablet 1  . prochlorperazine (COMPAZINE) 10 MG tablet Take 1 tablet (10 mg total) by mouth every 6 (six) hours as needed (Nausea or vomiting). 30 tablet 1  . TURMERIC CURCUMIN PO Take by mouth.    Marland Kitchen UNABLE TO FIND jarro-dophilus(probiotics)    . UNABLE TO FIND Flintstone vitamins 4-5 times a week     No current facility-administered medications for this visit.     No Known Allergies  REVIEW OF SYSTEMS: Skin:  No history of rash.  No history of abnormal moles. Infection:  No history of hepatitis or HIV.  No history of MRSA. Neurologic:  No history of stroke.  No history of seizure.  No history of headaches. Cardiac:  No history of hypertension. No history of heart disease.  No history of prior cardiac catheterization.  No history of seeing a cardiologist. Pulmonary:  Does not smoke cigarettes.  No asthma or bronchitis.  No OSA/CPAP.  Endocrine:  No diabetes. No thyroid disease. Gastrointestinal:  No history of stomach disease.  No history of liver disease.  No history of gall bladder disease.  No history of pancreas disease.  No history of colon disease. Urologic:  No history of kidney stones.  No history of bladder infections. Musculoskeletal:  No history of joint or back disease. Hematologic:  No bleeding disorder.  No history of anemia.  Not anticoagulated. Psycho-social:  The patient is oriented.   The patient has no obvious psychologic or social impairment to understanding our conversation and plan.  SOCIAL and FAMILY HISTORY: Chaun[place], [place], in room with patient.  He works for [employer] for [employer] She has 4 children:  45 yo daughter, 81 yo daughter (at [facility]), 35 yo son, and 29 yo son. 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  PHYSICAL EXAM: LMP 01/18/2015  General: [employer] AA F who is  alert and generally healthy appearing.  HEENT: Normal. Pupils equal. Neck: Supple. No mass.  No thyroid mass. Lymph Nodes:  No supraclavicular or cervical nodes.  Her right axilla she has a 2x3 cm mass, consistent with her met disease to axilla Lungs: Clear to auscultation and symmetric breath sounds. Breasts:  Right - Skin color changes, bruise from biopsy.  She has a 3 x 4 cm mass in the UOQ of the breast.  She has a palpable right axillary lymph node.  Left - No mass  Heart:  RRR. No murmur or rub. Abdomen: Soft. No mass. No tenderness. No hernia. Normal bowel sounds.  No abdominal scars. Rectal: Not done. Extremities:  Good strength and ROM  in upper and lower extremities. Neurologic:  Grossly intact to motor and sensory function. Psychiatric: Has normal mood and affect. Behavior is normal.  DATA REVIEWED: Epic notes.  Alphonsa Overall, MD,  [facility], [address]  [place], [place]    [place] Phone:  [phone] FAX:  [phone]

## 2015-02-08 NOTE — Progress Notes (Signed)
Subjective:     Patient ID: Phyllis Pruitt, female   DOB: [birth year], 44 y.o.   MRN: [id number]  HPI   Review of Systems     Objective:   Physical Exam For the patient to understand and be given the tools to implement a healthy plant based diet during their cancer diagnosis.     Assessment:     Patient was seen today and found to be in good spirits and accompanied by her soon to be husband (this Saturday is the wedding). Pts labs: CO2 21, BUN 4.6, GFR 65. Pts supplements: beta carotene, probiotic, echinecea, ginger root, tumeric, curcumin, flinstone vitamin and has taken phentermine on and off. Pt seemed more and more anxious as the appointment continued with her hopping up and down from the bed and bouncing a lot. Pt believed she needed an alkaline diet, needed several different supplements, and needed over a gallon of water a day. Pt also believed she needed to not consume carbohydrates due to health.      Plan:     Dietitian educated the patient on implementing a plant based diet by incorporating more plant proteins, fruits, and vegetables. As a part of a healthy routine physical activity was discussed. Dietitian explained supplements, that water can be drunken in excess and cause an electrolyte imbalance. Dietitian also advised she consume probiotics via fermented products rather than a capsule. A folder of evidence based information with a focus on a plant based diet and general nutrition during cancer was given to the patient.  The importance of legitimate, evidence based information was discussed and examples were given. As a part of the continuum of care the cancer dietitian's contact information was given to the patient in the event they would like to have a follow up appointment.

## 2015-02-08 NOTE — Progress Notes (Signed)
[facility] Clinical Social Work  Clinical Social Work met with patient/family at multidisciplinary appointment to offer support and assess for psychosocial needs.  Patient was accompanied by Phyllis Pruitt.  They are getting married next weekend.  She shared they met at church nine years ago and have recently decided to move up wedding due to cancer diagnosis.  Phyllis Pruitt shared she has no distress at this time and is in agreement with the physician's plans.  She shared her faith in God is her main source of support/comfort.  She has a support system that consists of her spouse, 4 children, and 1 grandchild.  The patient was interested in support programs, specifically Tai Chi/Yoga classes.  She provided verbal consent for referral to [facility], CSW will send referral.  ONCBCN DISTRESS SCREENING 02/08/2015  Screening Type Initial Screening  Distress experienced in past week (1-10) 0    Clinical Social Work briefly discussed [place] Work role and [employer] support programs/services.  Clinical Social Work encouraged patient to call with any additional questions or concerns.   Polo Riley, MSW, LCSW, OSW-C Clinical Social Worker [facility] [phone]

## 2015-02-08 NOTE — Assessment & Plan Note (Signed)
Right breast invasive ductal carcinoma multifocal, multicentric disease with at least 8 tumors, 2.3 cm, 1.6 cm of the biggest tumors in addition 6 more tumors 1 cm or less spanning 13.7 cm, right axillary lymph node biopsy positive (ER 22%); primary tumor is ER 0%, PR 0%, HER-2 negative, Ki-67 77% and 73% respectively, grade 3 T3 N1 M0 equals stage IIIa, Grade 3  Pathology and radiology counseling:Discussed with the patient, the details of pathology including the type of breast cancer,the clinical staging, the significance of ER, PR and HER-2/neu receptors and the implications for treatment. After reviewing the pathology in detail, we proceeded to discuss the different treatment options between surgery, radiation, chemotherapy.  Recommendation: 1. Neoadjuvant chemotherapy with dose dense Adriamycin and Cytoxan every 2 weeks 4 followed by weekly Abraxane 12 2. Followed by mastectomy with or without lymph node dissection, ALLIANCE trail if eligible 3. Followed by radiation 4. Followed by tamoxifen once daily 10 years because the lymph node was ER positive  Chemotherapy counseling:I have discussed the risks and benefits of chemotherapy including the risks of nausea/ vomiting, risk of infection from low WBC count, fatigue due to chemo or anemia, bruising or bleeding due to low platelets, mouth sores, loss/ change in taste and decreased appetite. Liver and kidney function will be monitored through out chemotherapy as abnormalities in liver and kidney function may be a side effect of treatment.  Cardiac dysfunction due to Adriamycin was discussed in detail. Risk of permanent bone marrow dysfunction and leukemia due to chemo were also discussed.  PREVENT trial: [id number]  Newly diagnosed breast cancer Stages 1-3 scheduled to receive anthracycline randomized to atorvastatin 40 mg or placebo once daily for 24 months along with 3 cardiac MRIs or 2 years paid for by the trial. I discussed the risks and  benefits of atorvastatin including the risk of myopathy and elevation of liver function tests. I explained the randomization process and patient is interested in the trial. I provided her with literature to read and provided her with the contact with the clinical trials nurse to ask any questions regarding the trial.  Plan: 1. Genetic counseling 2. CT chest abdomen pelvis and bone scan for staging 3. Port placement 4. Echocardiogram 5. Chemotherapy class 6. Clinical trial discussion regarding PREVENT.  Because the lymph node was ER positive, she was ineligible for NRG BR 003 clinical trial for adding carboplatin. Patient is going to get married on 02/18/2015. We will hopefully start her treatment 02/20/2015.

## 2015-02-08 NOTE — Progress Notes (Signed)
Note created by Dr. Gudena during office visit. Copy to patient, original to scan. 

## 2015-02-08 NOTE — Patient Instructions (Signed)

## 2015-02-08 NOTE — Progress Notes (Signed)
Checked in new pt with no financial concerns prior to seeing the dr.  Informed pt if chemo is part of her treatment we will obtain auth from her ins company as well as contact foundations that offer copay assistance for chemo if needed.  She has my card for any billing questions or concerns.

## 2015-02-08 NOTE — Telephone Encounter (Signed)
Appointments for chemo made and avs printed for patient

## 2015-02-08 NOTE — Telephone Encounter (Signed)
Chemo ed appointment made,echo to linda to precert,patinet will get a call with scans and an avs will be printed upon patient ck out     anne

## 2015-02-09 ENCOUNTER — Other Ambulatory Visit (HOSPITAL_BASED_OUTPATIENT_CLINIC_OR_DEPARTMENT_OTHER): Payer: 59

## 2015-02-09 ENCOUNTER — Ambulatory Visit: Payer: Self-pay | Admitting: Surgery

## 2015-02-09 ENCOUNTER — Encounter (HOSPITAL_COMMUNITY): Payer: Self-pay | Admitting: *Deleted

## 2015-02-09 ENCOUNTER — Ambulatory Visit (HOSPITAL_BASED_OUTPATIENT_CLINIC_OR_DEPARTMENT_OTHER): Payer: 59 | Admitting: Genetic Counselor

## 2015-02-09 DIAGNOSIS — C50411 Malignant neoplasm of upper-outer quadrant of right female breast: Secondary | ICD-10-CM | POA: Diagnosis not present

## 2015-02-09 DIAGNOSIS — Z315 Encounter for genetic counseling: Secondary | ICD-10-CM | POA: Diagnosis not present

## 2015-02-09 LAB — CBC WITH DIFFERENTIAL/PLATELET
BASO%: 0.4 % (ref 0.0–2.0)
Basophils Absolute: 0 10*3/uL (ref 0.0–0.1)
EOS ABS: 0.1 10*3/uL (ref 0.0–0.5)
EOS%: 0.8 % (ref 0.0–7.0)
HCT: 35 % (ref 34.8–46.6)
HEMOGLOBIN: 11.7 g/dL (ref 11.6–15.9)
LYMPH#: 1.8 10*3/uL (ref 0.9–3.3)
LYMPH%: 24.8 % (ref 14.0–49.7)
MCH: 26.8 pg (ref 25.1–34.0)
MCHC: 33.4 g/dL (ref 31.5–36.0)
MCV: 80.3 fL (ref 79.5–101.0)
MONO#: 0.6 10*3/uL (ref 0.1–0.9)
MONO%: 8 % (ref 0.0–14.0)
NEUT%: 66 % (ref 38.4–76.8)
NEUTROS ABS: 4.9 10*3/uL (ref 1.5–6.5)
Platelets: 284 10*3/uL (ref 145–400)
RBC: 4.36 10*6/uL (ref 3.70–5.45)
RDW: 14.4 % (ref 11.2–14.5)
WBC: 7.4 10*3/uL (ref 3.9–10.3)

## 2015-02-09 LAB — COMPREHENSIVE METABOLIC PANEL (CC13)
ALK PHOS: 65 U/L (ref 40–150)
ALT: 12 U/L (ref 0–55)
AST: 11 U/L (ref 5–34)
Albumin: 3.8 g/dL (ref 3.5–5.0)
Anion Gap: 11 mEq/L (ref 3–11)
BILIRUBIN TOTAL: 0.34 mg/dL (ref 0.20–1.20)
BUN: 4.3 mg/dL — AB (ref 7.0–26.0)
CO2: 22 mEq/L (ref 22–29)
Calcium: 9.6 mg/dL (ref 8.4–10.4)
Chloride: 106 mEq/L (ref 98–109)
Creatinine: 0.8 mg/dL (ref 0.6–1.1)
Glucose: 118 mg/dl (ref 70–140)
Potassium: 4.2 mEq/L (ref 3.5–5.1)
Sodium: 139 mEq/L (ref 136–145)
Total Protein: 7.5 g/dL (ref 6.4–8.3)

## 2015-02-09 NOTE — Progress Notes (Signed)
Patient Name: Phyllis Pruitt Patient Age: 44 y.o. Encounter Date: 02/09/2015  Referring Physician: Nicholas Lose, MD  Primary Care Provider: Osborne Casco, MD   [name], a 44 y.o. female, is being seen at the [facility] due to a personal history of breast cancer. She presents to clinic today to discuss the possibility of a hereditary predisposition to cancer and discuss whether genetic testing is warranted.  HISTORY OF PRESENT ILLNESS: [name] was recently diagnosed with right breast cancer at the age of 17. Her treatment plan will start with neoadjuvant chemotherapy.  The breast tumor was ER negative, PR negative, and HER2 negative.  She has no other history of cancer.    Breast cancer of upper-outer quadrant of right female breast   01/27/2015 Initial Diagnosis Right breast 9:00 and 10:00 biopsy: Grade 3 IDC ER/PR negative, HER-2 negative, Ki-67 77% and 73%; the right axillary lymph node biopsy: EF 22% positive   01/30/2015 Breast MRI Right breast masses 9 and 10:00 position, additional irregular masses with non-mass enhancement upper outer quadrant with one mass extending into upper inner quadrant, multifocal, multicentric 13.7 x 4.6 x 4.6 cm, malignant right axillary lymph node    Past Medical History  Diagnosis Date  . Breast cancer of upper-outer quadrant of right female breast 01/27/2015    Past Surgical History  Procedure Laterality Date  . Tubal ligation    . Tonsillectomy    . Wisdome teeth extraction      History   Social History  . Marital Status: Divorced    Spouse Name: N/A  . Number of Children: N/A  . Years of Education: N/A   Social History Main Topics  . Smoking status: Never Smoker   . Smokeless tobacco: Not on file  . Alcohol Use: No  . Drug Use: No  . Sexual Activity: Not on file   Other Topics Concern  . Not on file   Social History Narrative     FAMILY HISTORY:   During the visit, a 4-generation  pedigree was obtained. Family tree will be sent for scanning and will be in EPIC under the Media tab.  [name] has no family history of any cancer. She has two daughters and two sons. She has two full brothers (age 23 and 93). She has a maternal half-sister and 2 paternal half-sisters. Her mother is 71 and had a TAH/BSO at 17. Her only maternal aunt is 37 and had a TAH/BSO (age?). Her father is 4 and cancer-free as are his 4 sisters and 3 brothers (deceased).   [name] ancestry is African American. There is no known Jewish ancestry and no consanguinity.  ASSESSMENT AND PLAN: [name] is a 44 y.o. female with a personal history of triple negative breast cancer. This history is not highly suggestive of a hereditary predisposition to cancer, but both her mother and her only maternal aunt had TAH/BSO. Given her age and triple negative breast cancer, genetic testing is warranted. We reviewed the characteristics, features and inheritance patterns of hereditary cancer syndromes. We also discussed genetic testing, including the appropriate family members to test, the process of testing, insurance coverage and implications of results. A negative test will be reassuring.  [name] wished to pursue genetic testing and a blood sample will be sent to [facility] for analysis of the 17 genes on the BreastNext gene panel (ATM, BARD1, BRCA1, BRCA2, BRIP1, CDH1, CHEK2, MRE11A, MUTYH, NBN, NF1, PALB2, PTEN, RAD50, RAD51C, RAD51D, and TP53).  We discussed the implications of a positive, negative and/ or Variant of Uncertain Significance (VUS) result. Results should be available in approximately 3-4 weeks, at which point we will contact her and address implications for her as well as address genetic testing for at-risk family members, if needed.    We encouraged [name] to remain in contact with Cancer Genetics annually so that we can update the family history and inform her of any changes in  cancer genetics and testing that may be of benefit for this family. Ms.  Phyllis Pruitt questions were answered to her satisfaction today.   Thank you for the referral and allowing [place] to share in the care of your patient.   The patient was seen for a total of 30 minutes, greater than 50% of which was spent face-to-face counseling. This patient was discussed with the overseeing provider who agrees with the above.   Phyllis Berg, MS, [place] Certified Genetic Counselor phone: [phone] [email or web address]

## 2015-02-10 ENCOUNTER — Ambulatory Visit (HOSPITAL_COMMUNITY): Payer: 59

## 2015-02-10 ENCOUNTER — Encounter (HOSPITAL_COMMUNITY)
Admission: RE | Disposition: A | Discharge status: Home / Self Care | Payer: Self-pay | Source: Ambulatory Visit | Attending: Surgery

## 2015-02-10 ENCOUNTER — Encounter (HOSPITAL_COMMUNITY): Payer: Self-pay | Admitting: Anesthesiology

## 2015-02-10 ENCOUNTER — Ambulatory Visit (HOSPITAL_COMMUNITY): Payer: 59 | Admitting: Anesthesiology

## 2015-02-10 ENCOUNTER — Encounter: Payer: Self-pay | Admitting: Certified Registered Nurse Anesthetist

## 2015-02-10 ENCOUNTER — Ambulatory Visit (HOSPITAL_COMMUNITY)
Admission: RE | Admit: 2015-02-10 | Discharge: 2015-02-10 | Disposition: A | Discharge status: Home / Self Care | Payer: 59 | Source: Ambulatory Visit | Attending: Surgery | Admitting: Surgery

## 2015-02-10 DIAGNOSIS — C50911 Malignant neoplasm of unspecified site of right female breast: Secondary | ICD-10-CM | POA: Diagnosis present

## 2015-02-10 DIAGNOSIS — C50411 Malignant neoplasm of upper-outer quadrant of right female breast: Secondary | ICD-10-CM

## 2015-02-10 DIAGNOSIS — Z9851 Tubal ligation status: Secondary | ICD-10-CM | POA: Insufficient documentation

## 2015-02-10 HISTORY — PX: PORTACATH PLACEMENT: SHX2246

## 2015-02-10 LAB — HCG, SERUM, QUALITATIVE: PREG SERUM: NEGATIVE

## 2015-02-10 SURGERY — INSERTION, TUNNELED CENTRAL VENOUS DEVICE, WITH PORT
Anesthesia: General | Site: Chest

## 2015-02-10 MED ORDER — FENTANYL CITRATE (PF) 100 MCG/2ML IJ SOLN
INTRAMUSCULAR | Status: AC
  Filled 2015-02-10: qty 2

## 2015-02-10 MED ORDER — LIDOCAINE HCL 1 % IJ SOLN
INTRAMUSCULAR | Status: AC
Start: 2015-02-10 — End: 2015-02-10
  Filled 2015-02-10: qty 20

## 2015-02-10 MED ORDER — MIDAZOLAM HCL 5 MG/5ML IJ SOLN
INTRAMUSCULAR | Status: DC | PRN
  Administered 2015-02-10: 2 mg via INTRAVENOUS

## 2015-02-10 MED ORDER — CHLORHEXIDINE GLUCONATE 4 % EX LIQD: 1.0000 "application " | Freq: Once | CUTANEOUS | Status: DC

## 2015-02-10 MED ORDER — FENTANYL CITRATE (PF) 100 MCG/2ML IJ SOLN: 25.0000 ug | INTRAMUSCULAR | Status: DC | PRN

## 2015-02-10 MED ORDER — CEFAZOLIN SODIUM-DEXTROSE 2-3 GM-% IV SOLR
INTRAVENOUS | Status: AC
  Filled 2015-02-10: qty 50

## 2015-02-10 MED ORDER — HEPARIN SOD (PORK) LOCK FLUSH 100 UNIT/ML IV SOLN
INTRAVENOUS | Status: AC
  Filled 2015-02-10: qty 5

## 2015-02-10 MED ORDER — CEFAZOLIN SODIUM-DEXTROSE 2-3 GM-% IV SOLR
2.0000 g | INTRAVENOUS | Status: AC
  Administered 2015-02-10: 2 g via INTRAVENOUS

## 2015-02-10 MED ORDER — PHENYLEPHRINE HCL 10 MG/ML IJ SOLN
INTRAMUSCULAR | Status: DC | PRN
  Administered 2015-02-10: 40 ug via INTRAVENOUS
  Administered 2015-02-10: 80 ug via INTRAVENOUS

## 2015-02-10 MED ORDER — FENTANYL CITRATE (PF) 100 MCG/2ML IJ SOLN
INTRAMUSCULAR | Status: DC | PRN
  Administered 2015-02-10: 50 ug via INTRAVENOUS
  Administered 2015-02-10 (×2): 25 ug via INTRAVENOUS

## 2015-02-10 MED ORDER — ONDANSETRON HCL 4 MG/2ML IJ SOLN
INTRAMUSCULAR | Status: DC | PRN
  Administered 2015-02-10: 4 mg via INTRAVENOUS

## 2015-02-10 MED ORDER — LIDOCAINE HCL 1 % IJ SOLN
INTRAMUSCULAR | Status: DC | PRN
  Administered 2015-02-10: 60 mg via INTRADERMAL

## 2015-02-10 MED ORDER — LIDOCAINE HCL (CARDIAC) 20 MG/ML IV SOLN
INTRAVENOUS | Status: AC
  Filled 2015-02-10: qty 5

## 2015-02-10 MED ORDER — PROPOFOL 10 MG/ML IV BOLUS
INTRAVENOUS | Status: DC | PRN
  Administered 2015-02-10: 170 mg via INTRAVENOUS

## 2015-02-10 MED ORDER — LACTATED RINGERS IV SOLN
INTRAVENOUS | Status: DC | PRN
  Administered 2015-02-10: 11:00:00 via INTRAVENOUS

## 2015-02-10 MED ORDER — MEPERIDINE HCL 50 MG/ML IJ SOLN: 6.2500 mg | INTRAMUSCULAR | Status: DC | PRN

## 2015-02-10 MED ORDER — HEPARIN SOD (PORK) LOCK FLUSH 100 UNIT/ML IV SOLN
INTRAVENOUS | Status: DC | PRN
  Administered 2015-02-10: 400 [IU] via INTRAVENOUS

## 2015-02-10 MED ORDER — MIDAZOLAM HCL 2 MG/2ML IJ SOLN
INTRAMUSCULAR | Status: AC
  Filled 2015-02-10: qty 2

## 2015-02-10 MED ORDER — LACTATED RINGERS IV SOLN
INTRAVENOUS | Status: DC
  Administered 2015-02-10: 1000 mL via INTRAVENOUS

## 2015-02-10 MED ORDER — SODIUM CHLORIDE 0.9 % IR SOLN
Freq: Once | Status: AC
  Administered 2015-02-10: 4 mL
  Filled 2015-02-10: qty 1.2

## 2015-02-10 MED ORDER — LIDOCAINE HCL (PF) 1 % IJ SOLN
INTRAMUSCULAR | Status: DC | PRN
  Administered 2015-02-10: 5 mL

## 2015-02-10 MED ORDER — PROPOFOL 10 MG/ML IV BOLUS
INTRAVENOUS | Status: AC
  Filled 2015-02-10: qty 20

## 2015-02-10 MED ORDER — LACTATED RINGERS IV SOLN: INTRAVENOUS | Status: DC

## 2015-02-10 MED ORDER — HYDROCODONE-ACETAMINOPHEN 5-325 MG PO TABS: 1.0000 | ORAL_TABLET | Freq: Four times a day (QID) | ORAL | Status: DC | PRN

## 2015-02-10 MED ORDER — DEXAMETHASONE SODIUM PHOSPHATE 10 MG/ML IJ SOLN
INTRAMUSCULAR | Status: AC
  Filled 2015-02-10: qty 1

## 2015-02-10 MED ORDER — PROMETHAZINE HCL 25 MG/ML IJ SOLN: 6.2500 mg | INTRAMUSCULAR | Status: DC | PRN

## 2015-02-10 SURGICAL SUPPLY — 32 items
APL SKNCLS STERI-STRIP NONHPOA (GAUZE/BANDAGES/DRESSINGS) ×1
BAG DECANTER FOR FLEXI CONT (MISCELLANEOUS) ×3 IMPLANT
BENZOIN TINCTURE PRP APPL 2/3 (GAUZE/BANDAGES/DRESSINGS) ×3 IMPLANT
BLADE HEX COATED 2.75 (ELECTRODE) ×3 IMPLANT
BLADE SURG 15 STRL LF DISP TIS (BLADE) ×1 IMPLANT
BLADE SURG 15 STRL SS (BLADE) ×3
CHLORAPREP W/TINT 26ML (MISCELLANEOUS) ×3 IMPLANT
CLOSURE WOUND 1/4X4 (GAUZE/BANDAGES/DRESSINGS) ×1
DECANTER SPIKE VIAL GLASS SM (MISCELLANEOUS) ×3 IMPLANT
DRAPE C-ARM 42X120 X-RAY (DRAPES) ×3 IMPLANT
DRAPE LAPAROTOMY TRNSV 102X78 (DRAPE) ×3 IMPLANT
ELECT REM PT RETURN 9FT ADLT (ELECTROSURGICAL) ×3
ELECTRODE REM PT RTRN 9FT ADLT (ELECTROSURGICAL) ×1 IMPLANT
GAUZE SPONGE 2X2 8PLY STRL LF (GAUZE/BANDAGES/DRESSINGS) ×1 IMPLANT
GAUZE SPONGE 4X4 12PLY STRL (GAUZE/BANDAGES/DRESSINGS) ×6 IMPLANT
GAUZE SPONGE 4X4 16PLY XRAY LF (GAUZE/BANDAGES/DRESSINGS) ×3 IMPLANT
GLOVE SURG SIGNA 7.5 PF LTX (GLOVE) ×3 IMPLANT
GOWN STRL REUS W/TWL XL LVL3 (GOWN DISPOSABLE) ×6 IMPLANT
KIT BASIN OR (CUSTOM PROCEDURE TRAY) ×3 IMPLANT
KIT PORT POWER 8FR ISP CVUE (Catheter) ×2 IMPLANT
NDL HYPO 25X1 1.5 SAFETY (NEEDLE) ×1 IMPLANT
NEEDLE HYPO 25X1 1.5 SAFETY (NEEDLE) ×3 IMPLANT
PACK BASIC VI WITH GOWN DISP (CUSTOM PROCEDURE TRAY) ×3 IMPLANT
PENCIL BUTTON HOLSTER BLD 10FT (ELECTRODE) ×3 IMPLANT
SPONGE GAUZE 2X2 STER 10/PKG (GAUZE/BANDAGES/DRESSINGS) ×2
STRIP CLOSURE SKIN 1/4X4 (GAUZE/BANDAGES/DRESSINGS) ×2 IMPLANT
SUT MNCRL AB 4-0 PS2 18 (SUTURE) ×3 IMPLANT
SUT MON AB 5-0 PS2 18 (SUTURE) ×2 IMPLANT
SUT VIC AB 3-0 SH 18 (SUTURE) ×3 IMPLANT
SYR 20CC LL (SYRINGE) ×3 IMPLANT
SYRINGE 10CC LL (SYRINGE) ×3 IMPLANT
TOWEL OR 17X26 10 PK STRL BLUE (TOWEL DISPOSABLE) ×3 IMPLANT

## 2015-02-10 NOTE — Addendum Note (Signed)
Addendum  created 02/10/15 1331 by Montel Clock, CRNA   Modules edited: Anesthesia Attestations

## 2015-02-10 NOTE — Op Note (Signed)
02/10/2015  12:48 PM  PATIENT:  Phyllis Pruitt, 44 y.o., female MRN: [id number] DOB: 03/31/1971  PREOP DIAGNOSIS:  Advanced right breast cancer, anticipate chemotherapy  POSTOP DIAGNOSIS:   Advanced right breast cancer, anticipate chemotherapy  PROCEDURE:   Procedure(s): INSERTION PORT-A-CATH, left subclavian  SURGEON:   Alphonsa Overall, M.D.  ANESTHESIA:   general  Anesthesiologist: Montez Hageman, MD CRNA: Montel Clock, CRNA; Garrel Ridgel, CRNA  General  EBL:  minimal  ml  COUNTS CORRECT:  YES  INDICATIONS FOR PROCEDURE:  Phyllis Pruitt is a 44 y.o. (DOB: Nov 27, 1970) AA female whose primary care physician is Osborne Casco, MD and comes for power port placement for the treatment of advanced right breast cancer with neoadjuvant chemotherapy.  Dr. [name] is her treating oncologist.   The indications and risks of the surgery were explained to the patient.  The risks include, but are not limited to, infection, bleeding, pneumothorax, nerve injury, and thrombosis of the vein.  OPERATIVE NOTE:  The patient was taken to Room #3 at [facility].  Anesthesia was provided by Anesthesiologist: Montez Hageman, MD CRNA: Montel Clock, CRNA; Garrel Ridgel, CRNA.  At the beginning of the operation, the patient was given 2 gm Ancef, had a roll placed under her back, and had the upper chest/neck prepped with Chloroprep and draped.   A time out was held and the surgery checklist reviewed.   The patient was placed in Trendelenburg position.  The left subclavian vein was accessed with a 16 gauge needle and a guide wire threaded through the needle into the vein.  The position of the wire was checked with fluoroscopy.   I then developed a pocket in the upper inner aspect of the left chest for the port reservoir.  I used the [employer] for venous access.  The reservoir was sewn in place with a 3-0 Vicryl suture.  The reservoir had been flushed with dilute (10  units/cc) heparin.   I then passed the silastic tubing from the reservoir incision to the subclavian stick site and used the 8 French introducer to pass it into the vein.  The tip of the silastic catheter was position at the junction of the SVC and the right atrium under fluoroscopy.  The silastic catheter was then attached to the port with the bayonet device.     The entire port and tubing were checked with fluoroscopy and then the port was flushed with 4 cc of concentrated heparin (100 units/cc).   The wounds were then closed with 3-0 vicryl subcutaneous sutures and the skin closed with a 5-0 Monocryl suture.  The skin was painted with tincture of benzoin and steri-stripped.   The patient was transferred to the recovery room in good condition.  The sponge and needle count were correct at the end of the case.  A CXR is ordered for port placement and pending at the time of this note.  Alphonsa Overall, MD, [facility] Surgery Pager: [phone] Office phone:  [phone]

## 2015-02-10 NOTE — Transfer of Care (Signed)
Immediate Anesthesia Transfer of Care Note  Patient: Phyllis Pruitt  Procedure(s) Performed: Procedure(s): INSERTION PORT-A-CATH WITH ULTRA SOUND, left subclavian, (N/A)  Patient Location: PACU  Anesthesia Type:General  Level of Consciousness:  sedated, patient cooperative and responds to stimulation  Airway & Oxygen Therapy:Patient Spontanous Breathing and Patient connected to face mask oxgen  Post-op Assessment:  Report given to PACU RN and Post -op Vital signs reviewed and stable  Post vital signs:  Reviewed and stable  Last Vitals:  Filed Vitals:   02/10/15 0904  BP: 116/79  Pulse: 78  Temp: 36.9 C  Resp: 16    Complications: No apparent anesthesia complications

## 2015-02-10 NOTE — Anesthesia Procedure Notes (Signed)
Procedure Name: LMA Insertion Date/Time: 02/10/2015 11:45 AM Performed by: Montel Clock Pre-anesthesia Checklist: Patient identified, Emergency Drugs available, Suction available, Patient being monitored and Timeout performed Patient Re-evaluated:Patient Re-evaluated prior to inductionOxygen Delivery Method: Circle system utilized Preoxygenation: Pre-oxygenation with 100% oxygen Intubation Type: IV induction Ventilation: Mask ventilation without difficulty LMA: LMA inserted LMA Size: 4.0 Number of attempts: 1 Dental Injury: Teeth and Oropharynx as per pre-operative assessment

## 2015-02-10 NOTE — Discharge Instructions (Signed)
[facility] - DISCHARGE INSTRUCTIONS TO PATIENT  Activity:  Driving - May drive in 1 to 3 days, if doing well.   Lifting - Take it easy for one week.  No lifting more than 15 pounds, then no limit  Wound Care:   Leave bandage for 2 days, then remove and shower.  Diet:  As tolerated  Follow up appointment:  Call Dr. Pollie Friar office [facility]) at [phone] for an appointment in 2 months.  Medications and dosages:  Resume your home medications.  You have a prescription for:  Vicodin  Call Dr. Lucia Gaskins or his office  [phone]) if you have:  Temperature greater than 100.4,  Persistent nausea and vomiting,  Severe uncontrolled pain,  Redness, tenderness, or signs of infection (pain, swelling, redness, odor or green/yellow discharge around the site),  Difficulty breathing, headache or visual disturbances,  Any other questions or concerns you may have after discharge.  In an emergency, call 911 or go to an Emergency Department at a nearby hospital.

## 2015-02-10 NOTE — Interval H&P Note (Signed)
History and Physical Interval Note:  02/10/2015 11:32 AM  Phyllis Pruitt  has presented today for surgery, with the diagnosis of right breast cancer  The various methods of treatment have been discussed with the patient and family.  Her fiancee, Richardson Landry, is here today.  After consideration of risks, benefits and other options for treatment, the patient has consented to  Procedure(s): INSERTION PORT-A-CATH WITH ULTRA SOUND (N/A) as a surgical intervention .  The patient's history has been reviewed, patient examined, no change in status, stable for surgery.  I have reviewed the patient's chart and labs.  Questions were answered to the patient's satisfaction.     Carson Bogden H

## 2015-02-10 NOTE — H&P (View-Only) (Signed)
Re:   Phyllis Pruitt DOB:   02-14-1971 MRN:   [id number]   [facility]  ASSESSMENT AND PLAN: 1.  Right breast cancer, advanced, UOQ  Plan neoadjuvant chemotherapy  2.  Power port placement  Will schedule this when available.  3.  Getting married 02/18/2015  No chief complaint on file.  REFERRING PHYSICIAN: [name], MD  HISTORY OF PRESENT ILLNESS: Phyllis Pruitt is a 44 y.o. (DOB: [birth year])  AA  female whose primary care physician is Phyllis Divine, MD and comes to [facility] today for right breast cancer. Her fiancee, Phyllis Pruitt, is with her.  They are getting married 02/18/2015. Oncology - Phyllis Pruitt and Phyllis Pruitt.  She got sick in March.  She noticed a lump in her right breast around 12/22/2014.  She had never had mammograms before.  She had no breast problems before she got sick.  She attributed the changes in her breast to her illness first.  I think that she had some dental issues.  She had mammograms through [facility] on 01/19/2015.  She had at least 6 lesions identified.  And her right axillary node was abnormal.  Her breast MRI - 01/30/2015 - Masses compatible with biopsy-proven invasive ductal carcinoma with clip artifact at the 9 and 10 o'clock positions of the right breast. Approximately 6 additional enhancing irregular masses with associated non mass enhancement in the upper-outer quadrant of the right breast with 1 mass extending into the upper inner quadrant.  The most anterior mass lies in the upper-outer right retroareolar region. The non mass enhancement extends into the outer lower quadrant. Findings likely represent multifocal, multicentric disease as these abnormalities measure approximately 13.7 x 4.6 x 4.6 cm together. Abnormal biopsy-proven malignant right axillary lymph node.  Pathology - IDC, Grade III, ER/PR - neg, Her2Neu neg, Ki67 - 77%.  Right axillary node positive.  This was her first mammogram.  She sees Dr. Jason Pruitt for GYN.  He  has also put her on Phentermine for weight loss.  This worked well, but she is off the Phentermine now.  Her LMP was 01/19/2015.  She is not on hormone meds.   I discussed the options for breast cancer treatment with the patient.   I discussed the surgical options of lumpectomy vs. mastectomy.  If mastectomy, there is the possibility of reconstruction.   I discussed the options of lymph node biopsy.  The treatment plan depends on the pathologic staging of the tumor and the patient's personal wishes.  The risks of surgery include, but are not limited to, bleeding, infection, the need for further surgery, and nerve injury.  The patient has been given literature on the treatment of breast cancer.   Because of the size of the cancer and mets to the axilla, I agree that she is best served with neoadjuvant chemo tx.  Power port placement - I discussed the indications and potential complications of the power port placement.  The primary complications of the power port, include, but are not limited to, bleeding, infection, nerve injury, thrombosis, and pneumothorax.   Past Medical History  Diagnosis Date  . Breast cancer of upper-outer quadrant of right female breast 01/27/2015      Past Surgical History  Procedure Laterality Date  . Tubal ligation    . Tonsillectomy    . Wisdome teeth extraction        Current Outpatient Prescriptions  Medication Sig Dispense Refill  . beta carotene 30 MG capsule Take 30 mg by mouth  daily.    . dexamethasone (DECADRON) 4 MG tablet Take 1 tablets by mouth once a day on the day after chemotherapy and then take 1 tablets two times a day for 2 days. Take with food. 30 tablet 1  . ECHINACEA PO Take by mouth.    . Ginger, Zingiber officinalis, (GINGER ROOT PO) Take by mouth.    . lidocaine-prilocaine (EMLA) cream Apply to affected area once 30 g 3  . LORazepam (ATIVAN) 0.5 MG tablet Take 1 tablet (0.5 mg total) by mouth at bedtime. 30 tablet 0  . ondansetron (ZOFRAN)  8 MG tablet Take 1 tablet (8 mg total) by mouth 2 (two) times daily as needed. Start on the third day after chemotherapy. 30 tablet 1  . prochlorperazine (COMPAZINE) 10 MG tablet Take 1 tablet (10 mg total) by mouth every 6 (six) hours as needed (Nausea or vomiting). 30 tablet 1  . TURMERIC CURCUMIN PO Take by mouth.    Marland Kitchen UNABLE TO FIND jarro-dophilus(probiotics)    . UNABLE TO FIND [employer] vitamins 4-5 times a week     No current facility-administered medications for this visit.     No Known Allergies  REVIEW OF SYSTEMS: Skin:  No history of rash.  No history of abnormal moles. Infection:  No history of hepatitis or HIV.  No history of MRSA. Neurologic:  No history of stroke.  No history of seizure.  No history of headaches. Cardiac:  No history of hypertension. No history of heart disease.  No history of prior cardiac catheterization.  No history of seeing a cardiologist. Pulmonary:  Does not smoke cigarettes.  No asthma or bronchitis.  No OSA/CPAP.  Endocrine:  No diabetes. No thyroid disease. Gastrointestinal:  No history of stomach disease.  No history of liver disease.  No history of gall bladder disease.  No history of pancreas disease.  No history of colon disease. Urologic:  No history of kidney stones.  No history of bladder infections. Musculoskeletal:  No history of joint or back disease. Hematologic:  No bleeding disorder.  No history of anemia.  Not anticoagulated. Psycho-social:  The patient is oriented.   The patient has no obvious psychologic or social impairment to understanding our conversation and plan.  SOCIAL and FAMILY HISTORY: Phyllis, Pruitt, in room with patient.  He works for [employer] for [employer] She has 4 children:  23 yo daughter, 62 yo daughter (at [employer]), 44 yo son, and 34 yo son. 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  PHYSICAL EXAM: LMP 01/18/2015  General: Phyllis Pruitt who is  alert and generally healthy appearing.  HEENT: Normal. Pupils equal. Neck: Supple. No mass.  No thyroid mass. Lymph Nodes:  No supraclavicular or cervical nodes.  Her right axilla she has a 2x3 cm mass, consistent with her met disease to axilla Lungs: Clear to auscultation and symmetric breath sounds. Breasts:  Right - Skin color changes, bruise from biopsy.  She has a 3 x 4 cm mass in the UOQ of the breast.  She has a palpable right axillary lymph node.  Left - No mass  Heart:  RRR. No murmur or rub. Abdomen: Soft. No mass. No tenderness. No hernia. Normal bowel sounds.  No abdominal scars. Rectal: Not done. Extremities:  Good strength and ROM  in upper and lower extremities. Neurologic:  Grossly intact to motor and sensory function. Psychiatric: Has normal mood and affect. Behavior is normal.  DATA REVIEWED: Epic notes.  Alphonsa Overall, MD,  [facility], [address]  [place], [place]    [place] Phone:  [phone] FAX:  [phone]

## 2015-02-10 NOTE — Anesthesia Preprocedure Evaluation (Addendum)
Anesthesia Evaluation  Patient identified by MRN, date of birth, ID band Patient awake    Reviewed: Allergy & Precautions, NPO status , Patient's Chart, lab work & pertinent test results  Airway Mallampati: II  TM Distance: >3 FB Neck ROM: Full    Dental no notable dental hx.    Pulmonary neg pulmonary ROS,  breath sounds clear to auscultation  Pulmonary exam normal       Cardiovascular negative cardio ROS  Rhythm:Regular Rate:Normal     Neuro/Psych negative neurological ROS  negative psych ROS   GI/Hepatic negative GI ROS, Neg liver ROS,   Endo/Other  negative endocrine ROS  Renal/GU negative Renal ROS  negative genitourinary   Musculoskeletal negative musculoskeletal ROS (+)   Abdominal   Peds negative pediatric ROS (+)  Hematology negative hematology ROS (+)   Anesthesia Other Findings   Reproductive/Obstetrics negative OB ROS                             Anesthesia Physical Anesthesia Plan  ASA: I  Anesthesia Plan: General   Post-op Pain Management:    Induction: Intravenous  Airway Management Planned: LMA  Additional Equipment:   Intra-op Plan:   Post-operative Plan: Extubation in OR  Informed Consent: I have reviewed the patients History and Physical, chart, labs and discussed the procedure including the risks, benefits and alternatives for the proposed anesthesia with the patient or authorized representative who has indicated his/her understanding and acceptance.   Dental advisory given  Plan Discussed with: CRNA  Anesthesia Plan Comments:         Anesthesia Quick Evaluation  

## 2015-02-10 NOTE — Anesthesia Postprocedure Evaluation (Signed)
  Anesthesia Post-op Note  Patient: Phyllis Pruitt  Procedure(s) Performed: Procedure(s) (LRB): INSERTION PORT-A-CATH WITH ULTRA SOUND, left subclavian, (N/A)  Patient Location: PACU  Anesthesia Type: General  Level of Consciousness: awake and alert   Airway and Oxygen Therapy: Patient Spontanous Breathing  Post-op Pain: mild  Post-op Assessment: Post-op Vital signs reviewed, Patient's Cardiovascular Status Stable, Respiratory Function Stable, Patent Airway and No signs of Nausea or vomiting  Last Vitals:  Filed Vitals:   02/10/15 1315  BP: 131/84  Pulse: 73  Temp:   Resp: 13    Post-op Vital Signs: stable   Complications: No apparent anesthesia complications

## 2015-02-13 ENCOUNTER — Other Ambulatory Visit: Payer: Self-pay | Admitting: Medical Oncology

## 2015-02-13 ENCOUNTER — Encounter (HOSPITAL_COMMUNITY): Payer: Self-pay | Admitting: Surgery

## 2015-02-13 ENCOUNTER — Other Ambulatory Visit: Payer: Self-pay | Admitting: Hematology and Oncology

## 2015-02-13 ENCOUNTER — Telehealth: Payer: Self-pay | Admitting: Hematology and Oncology

## 2015-02-13 DIAGNOSIS — C50911 Malignant neoplasm of unspecified site of right female breast: Secondary | ICD-10-CM

## 2015-02-13 NOTE — Telephone Encounter (Signed)
Lft msg for pt confirming chemo edu class r/s from 05/04 to 05/03 at 5 pm per 05/02 POF..... Advised pt to c/b to confirm D/T change.... KJ

## 2015-02-14 ENCOUNTER — Encounter (HOSPITAL_COMMUNITY): Payer: Self-pay | Admitting: Emergency Medicine

## 2015-02-14 ENCOUNTER — Emergency Department (HOSPITAL_COMMUNITY): Payer: 59

## 2015-02-14 ENCOUNTER — Encounter (HOSPITAL_COMMUNITY): Payer: 59

## 2015-02-14 ENCOUNTER — Other Ambulatory Visit: Payer: 59

## 2015-02-14 ENCOUNTER — Emergency Department (HOSPITAL_COMMUNITY)
Admission: EM | Admit: 2015-02-14 | Discharge: 2015-02-14 | Disposition: A | Discharge status: Home / Self Care | Payer: 59 | Attending: Emergency Medicine | Admitting: Emergency Medicine

## 2015-02-14 ENCOUNTER — Ambulatory Visit (HOSPITAL_COMMUNITY): Payer: 59

## 2015-02-14 DIAGNOSIS — K029 Dental caries, unspecified: Secondary | ICD-10-CM | POA: Diagnosis not present

## 2015-02-14 DIAGNOSIS — R0602 Shortness of breath: Secondary | ICD-10-CM | POA: Diagnosis not present

## 2015-02-14 DIAGNOSIS — R079 Chest pain, unspecified: Secondary | ICD-10-CM | POA: Insufficient documentation

## 2015-02-14 DIAGNOSIS — C50411 Malignant neoplasm of upper-outer quadrant of right female breast: Secondary | ICD-10-CM | POA: Diagnosis not present

## 2015-02-14 DIAGNOSIS — Z79899 Other long term (current) drug therapy: Secondary | ICD-10-CM | POA: Insufficient documentation

## 2015-02-14 LAB — CBC
HEMATOCRIT: 33.7 % — AB (ref 36.0–46.0)
Hemoglobin: 11.1 g/dL — ABNORMAL LOW (ref 12.0–15.0)
MCH: 26.7 pg (ref 26.0–34.0)
MCHC: 32.9 g/dL (ref 30.0–36.0)
MCV: 81 fL (ref 78.0–100.0)
Platelets: 275 10*3/uL (ref 150–400)
RBC: 4.16 MIL/uL (ref 3.87–5.11)
RDW: 14.4 % (ref 11.5–15.5)
WBC: 8.7 10*3/uL (ref 4.0–10.5)

## 2015-02-14 LAB — COMPREHENSIVE METABOLIC PANEL
ALT: 12 U/L — AB (ref 14–54)
AST: 15 U/L (ref 15–41)
Albumin: 4 g/dL (ref 3.5–5.0)
Alkaline Phosphatase: 61 U/L (ref 38–126)
Anion gap: 11 (ref 5–15)
BUN: 5 mg/dL — ABNORMAL LOW (ref 6–20)
CO2: 24 mmol/L (ref 22–32)
Calcium: 9.3 mg/dL (ref 8.9–10.3)
Chloride: 104 mmol/L (ref 101–111)
Creatinine, Ser: 0.65 mg/dL (ref 0.44–1.00)
GFR calc Af Amer: >60 mL/min (ref >60)
GFR calc non Af Amer: >60 mL/min (ref >60)
Glucose, Bld: 93 mg/dL (ref 70–99)
POTASSIUM: 3.5 mmol/L (ref 3.5–5.1)
SODIUM: 139 mmol/L (ref 135–145)
TOTAL PROTEIN: 7.5 g/dL (ref 6.5–8.1)
Total Bilirubin: 0.7 mg/dL (ref 0.3–1.2)

## 2015-02-14 LAB — APTT: aPTT: 33 seconds (ref 24–37)

## 2015-02-14 LAB — I-STAT TROPONIN, ED: Troponin i, poc: 0 ng/mL (ref 0.00–0.08)

## 2015-02-14 LAB — PROTIME-INR
INR: 1.06 (ref 0.00–1.49)
Prothrombin Time: 13.9 seconds (ref 11.6–15.2)

## 2015-02-14 LAB — D-DIMER, QUANTITATIVE (NOT AT ARMC): D DIMER QUANT: 1.78 ug{FEU}/mL — AB (ref 0.00–0.48)

## 2015-02-14 MED ORDER — IOHEXOL 350 MG/ML SOLN
100.0000 mL | Freq: Once | INTRAVENOUS | Status: AC | PRN
  Administered 2015-02-14: 100 mL via INTRAVENOUS

## 2015-02-14 MED ORDER — PENICILLIN V POTASSIUM 500 MG PO TABS: 500.0000 mg | ORAL_TABLET | Freq: Four times a day (QID) | ORAL | Status: AC

## 2015-02-14 MED ORDER — SODIUM CHLORIDE 0.9 % IV SOLN
1000.0000 mL | INTRAVENOUS | Status: DC
  Administered 2015-02-14: 1000 mL via INTRAVENOUS

## 2015-02-14 NOTE — Discharge Instructions (Signed)
Chest Pain (Nonspecific) °It is often hard to give a specific diagnosis for the cause of chest pain. There is always a chance that your pain could be related to something serious, such as a heart attack or a blood clot in the lungs. You need to follow up with your health care provider for further evaluation. °CAUSES  °· Heartburn. °· Pneumonia or bronchitis. °· Anxiety or stress. °· Inflammation around your heart (pericarditis) or lung (pleuritis or pleurisy). °· A blood clot in the lung. °· A collapsed lung (pneumothorax). It can develop suddenly on its own (spontaneous pneumothorax) or from trauma to the chest. °· Shingles infection (herpes zoster virus). °The chest wall is composed of bones, muscles, and cartilage. Any of these can be the source of the pain. °· The bones can be bruised by injury. °· The muscles or cartilage can be strained by coughing or overwork. °· The cartilage can be affected by inflammation and become sore (costochondritis). °DIAGNOSIS  °Lab tests or other studies may be needed to find the cause of your pain. Your health care provider may have you take a test called an ambulatory electrocardiogram (ECG). An ECG records your heartbeat patterns over a 24-hour period. You may also have other tests, such as: °· Transthoracic echocardiogram (TTE). During echocardiography, sound waves are used to evaluate how blood flows through your heart. °· Transesophageal echocardiogram (TEE). °· Cardiac monitoring. This allows your health care provider to monitor your heart rate and rhythm in real time. °· Holter monitor. This is a portable device that records your heartbeat and can help diagnose heart arrhythmias. It allows your health care provider to track your heart activity for several days, if needed. °· Stress tests by exercise or by giving medicine that makes the heart beat faster. °TREATMENT  °· Treatment depends on what may be causing your chest pain. Treatment may include: °· Acid blockers for  heartburn. °· Anti-inflammatory medicine. °· Pain medicine for inflammatory conditions. °· Antibiotics if an infection is present. °· You may be advised to change lifestyle habits. This includes stopping smoking and avoiding alcohol, caffeine, and chocolate. °· You may be advised to keep your head raised (elevated) when sleeping. This reduces the chance of acid going backward from your stomach into your esophagus. °Most of the time, nonspecific chest pain will improve within 2-3 days with rest and mild pain medicine.  °HOME CARE INSTRUCTIONS  °· If antibiotics were prescribed, take them as directed. Finish them even if you start to feel better. °· For the next few days, avoid physical activities that bring on chest pain. Continue physical activities as directed. °· Do not use any tobacco products, including cigarettes, chewing tobacco, or electronic cigarettes. °· Avoid drinking alcohol. °· Only take medicine as directed by your health care provider. °· Follow your health care provider's suggestions for further testing if your chest pain does not go away. °· Keep any follow-up appointments you made. If you do not go to an appointment, you could develop lasting (chronic) problems with pain. If there is any problem keeping an appointment, call to reschedule. °SEEK MEDICAL CARE IF:  °· Your chest pain does not go away, even after treatment. °· You have a rash with blisters on your chest. °· You have a fever. °SEEK IMMEDIATE MEDICAL CARE IF:  °· You have increased chest pain or pain that spreads to your arm, neck, jaw, back, or abdomen. °· You have shortness of breath. °· You have an increasing cough, or you cough   up blood.  You have severe back or abdominal pain.  You feel nauseous or vomit.  You have severe weakness.  You faint.  You have chills. This is an emergency. Do not wait to see if the pain will go away. Get medical help at once. Call your local emergency services (911 in [place] Do not drive  yourself to the hospital. MAKE SURE YOU:   Understand these instructions.  Will watch your condition.  Will get help right away if you are not doing well or get worse. Document Released: 07/10/2005 Document Revised: 10/05/2013 Document Reviewed: 05/05/2008 [facility] Patient Information 2015 [place], [place]. This information is not intended to replace advice given to you by your health care provider. Make sure you discuss any questions you have with your health care provider. Dental Caries Dental caries (also called tooth decay) is the most common oral disease. It can occur at any age but is more common in children and young adults.  HOW DENTAL CARIES DEVELOPS  The process of decay begins when bacteria and foods (particularly sugars and starches) combine in your mouth to produce plaque. Plaque is a substance that sticks to the hard, outer surface of a tooth (enamel). The bacteria in plaque produce acids that attack enamel. These acids may also attack the root surface of a tooth (cementum) if it is exposed. Repeated attacks dissolve these surfaces and create holes in the tooth (cavities). If left untreated, the acids destroy the other layers of the tooth.  RISK FACTORS  Frequent sipping of sugary beverages.   Frequent snacking on sugary and starchy foods, especially those that easily get stuck in the teeth.   Poor oral hygiene.   Dry mouth.   Substance abuse such as methamphetamine abuse.   Broken or poor-fitting dental restorations.   Eating disorders.   Gastroesophageal reflux disease (GERD).   Certain radiation treatments to the head and neck. SYMPTOMS In the early stages of dental caries, symptoms are seldom present. Sometimes white, chalky areas may be seen on the enamel or other tooth layers. In later stages, symptoms may include:  Pits and holes on the enamel.  Toothache after sweet, hot, or cold foods or drinks are consumed.  Pain around the tooth.  Swelling  around the tooth. DIAGNOSIS  Most of the time, dental caries is detected during a regular dental checkup. A diagnosis is made after a thorough medical and dental history is taken and the surfaces of your teeth are checked for signs of dental caries. Sometimes special instruments, such as lasers, are used to check for dental caries. Dental X-ray exams may be taken so that areas not visible to the eye (such as between the contact areas of the teeth) can be checked for cavities.  TREATMENT  If dental caries is in its early stages, it may be reversed with a fluoride treatment or an application of a remineralizing agent at the dental office. Thorough brushing and flossing at home is needed to aid these treatments. If it is in its later stages, treatment depends on the location and extent of tooth destruction:   If a small area of the tooth has been destroyed, the destroyed area will be removed and cavities will be filled with a material such as gold, silver amalgam, or composite resin.   If a large area of the tooth has been destroyed, the destroyed area will be removed and a cap (crown) will be fitted over the remaining tooth structure.   If the center part of  the tooth (pulp) is affected, a procedure called a root canal will be needed before a filling or crown can be placed.   If most of the tooth has been destroyed, the tooth may need to be pulled (extracted). HOME CARE INSTRUCTIONS You can prevent, stop, or reverse dental caries at home by practicing good oral hygiene. Good oral hygiene includes:  Thoroughly cleaning your teeth at least twice a day with a toothbrush and dental floss.   Using a fluoride toothpaste. A fluoride mouth rinse may also be used if recommended by your dentist or health care provider.   Restricting the amount of sugary and starchy foods and sugary liquids you consume.   Avoiding frequent snacking on these foods and sipping of these liquids.   Keeping regular  visits with a dentist for checkups and cleanings. PREVENTION   Practice good oral hygiene.  Consider a dental sealant. A dental sealant is a coating material that is applied by your dentist to the pits and grooves of teeth. The sealant prevents food from being trapped in them. It may protect the teeth for several years.  Ask about fluoride supplements if you live in a community without fluorinated water or with water that has a low fluoride content. Use fluoride supplements as directed by your dentist or health care provider.  Allow fluoride varnish applications to teeth if directed by your dentist or health care provider. Document Released: 06/22/2002 Document Revised: 02/14/2014 Document Reviewed: 10/02/2012 [facility] Patient Information 2015 [place], [place]. This information is not intended to replace advice given to you by your health care provider. Make sure you discuss any questions you have with your health care provider.

## 2015-02-14 NOTE — ED Provider Notes (Signed)
CSN: [id number]     Arrival date & time 02/14/15  1450 History   First MD Initiated Contact with Patient 02/14/15 1517     Chief Complaint  Patient presents with  . Port-a-Cath Complication    Patient is a 44 y.o. female presenting with chest pain.  Chest Pain Pain location:  Substernal area Pain quality: pressure   Pain radiates to:  R shoulder, L shoulder and L jaw Pain severity:  Moderate Onset quality:  Gradual (The symptoms all started after having a portacath placed on April 29th.) Duration:  5 days Timing:  Constant Chronicity:  New Worsened by:  Deep breathing Ineffective treatments:  None tried Associated symptoms: shortness of breath   Associated symptoms: no abdominal pain, no cough, no fever, no nausea and not vomiting   Risk factors comment:  Breast Cancer, has not started treatment yet, had the portacath placed in preparation   Past Medical History  Diagnosis Date  . Breast cancer of upper-outer quadrant of right female breast 01/27/2015   Past Surgical History  Procedure Laterality Date  . Tubal ligation    . Tonsillectomy    . [place] teeth extraction    . Portacath placement N/A 02/10/2015    Procedure: INSERTION PORT-A-CATH WITH ULTRA SOUND, left subclavian,;  Surgeon: Alphonsa Overall, MD;  Location: [facility];  Service: General;  Laterality: N/A;   History reviewed. No pertinent family history. History  Substance Use Topics  . Smoking status: Never Smoker   . Smokeless tobacco: Not on file  . Alcohol Use: No   OB History    No data available     Review of Systems  Constitutional: Negative for fever.  Respiratory: Positive for shortness of breath. Negative for cough.   Cardiovascular: Positive for chest pain.  Gastrointestinal: Negative for nausea, vomiting and abdominal pain.  All other systems reviewed and are negative.     Allergies  Review of patient's allergies indicates no known allergies.  Home Medications   Prior to Admission medications     Medication Sig Start Date End Date Taking? Authorizing Provider  diphenhydramine-acetaminophen (TYLENOL PM) 25-500 MG TABS Take 1 tablet by mouth at bedtime as needed (sleep).    Yes Historical Provider, MD  ECHINACEA PO Take 1 tablet by mouth daily.    Yes Historical Provider, MD  Ginger, Zingiber officinalis, (GINGER ROOT PO) Take 1 tablet by mouth daily.    Yes Historical Provider, MD  Multiple Vitamin (MULTIVITAMIN WITH MINERALS) TABS tablet Take 1 tablet by mouth daily.   Yes Historical Provider, MD  TURMERIC CURCUMIN PO Take 1 capsule by mouth daily.    Yes Historical Provider, MD  dexamethasone (DECADRON) 4 MG tablet Take 1 tablets by mouth once a day on the day after chemotherapy and then take 1 tablets two times a day for 2 days. Take with food. Patient not taking: Reported on 02/14/2015 02/08/15   Nicholas Lose, MD  HYDROcodone-acetaminophen (NORCO/VICODIN) 5-325 MG per tablet Take 1-2 tablets by mouth every 6 (six) hours as needed for moderate pain. Patient not taking: Reported on 02/14/2015 02/10/15   Alphonsa Overall, MD  lidocaine-prilocaine (EMLA) cream Apply to affected area once Patient not taking: Reported on 02/14/2015 02/08/15   Nicholas Lose, MD  LORazepam (ATIVAN) 0.5 MG tablet Take 1 tablet (0.5 mg total) by mouth at bedtime. Patient not taking: Reported on 02/14/2015 02/08/15   Nicholas Lose, MD  ondansetron (ZOFRAN) 8 MG tablet Take 1 tablet (8 mg total) by mouth 2 (two) times  daily as needed. Start on the third day after chemotherapy. Patient not taking: Reported on 02/14/2015 02/08/15   Nicholas Lose, MD  penicillin v potassium (VEETID) 500 MG tablet Take 1 tablet (500 mg total) by mouth 4 (four) times daily. 02/14/15 02/21/15  Dorie Rank, MD  prochlorperazine (COMPAZINE) 10 MG tablet Take 1 tablet (10 mg total) by mouth every 6 (six) hours as needed (Nausea or vomiting). Patient not taking: Reported on 02/14/2015 02/08/15   Nicholas Lose, MD   BP 135/84 mmHg  Pulse 87  Temp(Src) 99 F (37.2 C)  (Oral)  Resp 16  SpO2 100%  LMP 02/13/2015 Physical Exam  Constitutional: She appears well-developed and well-nourished. No distress.  HENT:  Head: Normocephalic and atraumatic.  Right Ear: External ear normal.  Left Ear: External ear normal.  Mouth/Throat:    Eyes: Conjunctivae are normal. Right eye exhibits no discharge. Left eye exhibits no discharge. No scleral icterus.  Neck: Neck supple. No tracheal deviation present.  Cardiovascular: Normal rate, regular rhythm and intact distal pulses.   Pulmonary/Chest: Effort normal and breath sounds normal. No stridor. No respiratory distress. She has no wheezes. She has no rales. She exhibits tenderness.  Mild tenderness at the site of her left Port-A-Cath, few small areas of blisters on the skin that appear to be some type of reaction to either the surgical tape or wound adhesive, no increased warmth, no purulent drainage, no surrounding erythema, please see the attached image  Abdominal: Soft. Bowel sounds are normal. She exhibits no distension. There is no tenderness. There is no rebound and no guarding.  Musculoskeletal: She exhibits no edema or tenderness.  Neurological: She is alert. She has normal strength. No cranial nerve deficit (no facial droop, extraocular movements intact, no slurred speech) or sensory deficit. She exhibits normal muscle tone. She displays no seizure activity. Coordination normal.  Skin: Skin is warm and dry. No rash noted.  Psychiatric: She has a normal mood and affect.  Nursing note and vitals reviewed.     ED Course  Procedures (including critical care time) Labs Review Labs Reviewed  CBC - Abnormal; Notable for the following:    Hemoglobin 11.1 (*)    HCT 33.7 (*)    All other components within normal limits  COMPREHENSIVE METABOLIC PANEL - Abnormal; Notable for the following:    BUN 5 (*)    ALT 12 (*)    All other components within normal limits  D-DIMER, QUANTITATIVE - Abnormal; Notable for the  following:    D-Dimer, Quant 1.78 (*)    All other components within normal limits  APTT  PROTIME-INR  Randolm Idol, ED    Imaging Review Dg Chest 2 View  02/14/2015   CLINICAL DATA:  Chest pain, history of breast cancer  EXAM: CHEST  2 VIEW  COMPARISON:  02/10/2015  FINDINGS: Cardiomediastinal silhouette is stable. Left subclavian Port-A-Cath is unchanged in position. No acute infiltrate or pleural effusion. No pulmonary edema. Bony thorax is unremarkable.  IMPRESSION: No active cardiopulmonary disease.   Electronically Signed   By: Lahoma Crocker M.D.   On: 02/14/2015 16:34   Ct Angio Chest Pe W/cm &/or Wo Cm  02/14/2015   CLINICAL DATA:  Chest pain, recent diagnosis of breast cancer, elevated D-dimer  EXAM: CT ANGIOGRAPHY CHEST WITH CONTRAST  TECHNIQUE: Multidetector CT imaging of the chest was performed using the standard protocol during bolus administration of intravenous contrast. Multiplanar CT image reconstructions and MIPs were obtained to evaluate the vascular anatomy.  CONTRAST:  190mL OMNIPAQUE IOHEXOL 350 MG/ML SOLN  COMPARISON:  None.  FINDINGS: There is adequate opacification of the pulmonary arteries. There is no pulmonary embolus. The main pulmonary artery, right main pulmonary artery and left main pulmonary arteries are normal in size. The heart size is normal. There is no pericardial effusion. Left-sided Port-A-Cath with small foci of air adjacent to the port which may be secondary to recent access.  There is mild left basilar atelectasis. There is no focal consolidation, pleural effusion or pneumothorax.  There is no hilar or mediastinal adenopathy. There is a prominent 10 mm right axillary lymph node. There are prominent non pathologically enlarged left axillary lymph nodes.  There is a right upper outer breast mass.  There is no lytic or blastic osseous lesion.  The visualized portions of the upper abdomen are unremarkable.  Review of the MIP images confirms the above findings.   IMPRESSION: 1. No evidence of pulmonary embolus. 2. Right upper outer breast mass consistent with history of known cancer. Prominent 10 mm right axillary lymph node.   Electronically Signed   By: Kathreen Devoid   On: 02/14/2015 19:35     EKG Interpretation   Date/Time:  Tuesday Feb 14 2015 15:16:04 EDT Ventricular Rate:  84 PR Interval:  178 QRS Duration: 78 QT Interval:  347 QTC Calculation: 410 R Axis:   107 Text Interpretation:  Right and left arm electrode reversal,  interpretation assumes no reversal Sinus rhythm Right axis deviation Low  voltage, precordial leads Abnormal T, consider ischemia, lateral leads No  old tracing to compare Confirmed by Merril Isakson  MD-J, Hagan Maltz ([id number]) on 02/14/2015  3:24:48 PM      MDM   Final diagnoses:  Chest pain, unspecified chest pain type  Dental caries    The patient's chest x-ray and chest CT do not show any evidence of pneumothorax or pulmonary embolism. Chest discomfort may be related to her recent procedure but fortunately she does not appear to have any significant acute complications.  The patient incidentally was mentioning pain in her mouth. I do not believe this is referred pain from her chest. On exam she does have dental caries with decay down to the gumline. I recommend she follow up with a dentist as soon as possible. I will give her prescription for penicillin    Dorie Rank, MD 02/14/15 2009

## 2015-02-14 NOTE — ED Notes (Signed)
Pt c/o complications with port-a-cath placed April 29, states that blister formed over site and burst, c/o chest pressure worsening with movement. Visible open skin over port-a-cath.

## 2015-02-15 ENCOUNTER — Ambulatory Visit (HOSPITAL_COMMUNITY)
Admission: RE | Admit: 2015-02-15 | Discharge: 2015-02-15 | Disposition: A | Discharge status: Home / Self Care | Payer: 59 | Source: Ambulatory Visit | Attending: Hematology and Oncology | Admitting: Hematology and Oncology

## 2015-02-15 ENCOUNTER — Other Ambulatory Visit: Payer: Self-pay | Admitting: Medical Oncology

## 2015-02-15 ENCOUNTER — Encounter: Payer: 59 | Admitting: Medical Oncology

## 2015-02-15 ENCOUNTER — Other Ambulatory Visit: Payer: 59

## 2015-02-15 ENCOUNTER — Telehealth: Payer: Self-pay | Admitting: *Deleted

## 2015-02-15 ENCOUNTER — Encounter (HOSPITAL_COMMUNITY): Payer: Self-pay

## 2015-02-15 DIAGNOSIS — C50911 Malignant neoplasm of unspecified site of right female breast: Secondary | ICD-10-CM | POA: Diagnosis not present

## 2015-02-15 DIAGNOSIS — Z01818 Encounter for other preprocedural examination: Secondary | ICD-10-CM | POA: Diagnosis not present

## 2015-02-15 DIAGNOSIS — C50411 Malignant neoplasm of upper-outer quadrant of right female breast: Secondary | ICD-10-CM

## 2015-02-15 DIAGNOSIS — K8020 Calculus of gallbladder without cholecystitis without obstruction: Secondary | ICD-10-CM | POA: Insufficient documentation

## 2015-02-15 MED ORDER — IOHEXOL 300 MG/ML  SOLN
100.0000 mL | Freq: Once | INTRAMUSCULAR | Status: AC | PRN
  Administered 2015-02-15: 100 mL via INTRAVENOUS

## 2015-02-15 MED ORDER — IOHEXOL 300 MG/ML  SOLN
50.0000 mL | Freq: Once | INTRAMUSCULAR | Status: AC | PRN
  Administered 2015-02-15: 50 mL via ORAL

## 2015-02-15 NOTE — Telephone Encounter (Signed)
Left message for a return phone call to follow up with patient.  Awaiting patient response.

## 2015-02-16 ENCOUNTER — Other Ambulatory Visit: Payer: 59

## 2015-02-16 ENCOUNTER — Encounter (HOSPITAL_COMMUNITY): Payer: 59

## 2015-02-16 ENCOUNTER — Telehealth: Payer: Self-pay | Admitting: *Deleted

## 2015-02-16 ENCOUNTER — Ambulatory Visit (HOSPITAL_COMMUNITY): Payer: 59

## 2015-02-16 ENCOUNTER — Telehealth: Payer: Self-pay

## 2015-02-16 ENCOUNTER — Ambulatory Visit (HOSPITAL_COMMUNITY)
Admission: RE | Admit: 2015-02-16 | Discharge: 2015-02-16 | Disposition: A | Discharge status: Home / Self Care | Payer: 59 | Source: Ambulatory Visit | Attending: Hematology and Oncology | Admitting: Hematology and Oncology

## 2015-02-16 DIAGNOSIS — C50919 Malignant neoplasm of unspecified site of unspecified female breast: Secondary | ICD-10-CM | POA: Diagnosis present

## 2015-02-16 DIAGNOSIS — C50411 Malignant neoplasm of upper-outer quadrant of right female breast: Secondary | ICD-10-CM

## 2015-02-16 MED ORDER — TECHNETIUM TC 99M MEDRONATE IV KIT
26.6000 | PACK | Freq: Once | INTRAVENOUS | Status: AC | PRN
  Administered 2015-02-16: 26.6 via INTRAVENOUS

## 2015-02-16 NOTE — Telephone Encounter (Signed)
Spoke with patient today concerning her schedule.  Patient needs to have chemo education class and see Dr. [name].  I understand she has a lot going on with her upcoming wedding this weekend. Offered her to push back her chemo to mid week, but she did not want to this.  Scheduled her to see Dr. [name] at 845am tomorrow as well as chemo education class at 930am.  Informed her she may have to meet with the research as well tomorrow to finalize information for the study. Patient verbalized understanding.

## 2015-02-16 NOTE — Telephone Encounter (Signed)
Received after hours call report from [place] 02/14/15.  Sent to scan.

## 2015-02-16 NOTE — Assessment & Plan Note (Signed)
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  I reviewed the CT scan with the patient and provided with a copy of this report. We will refer her to urology for further evaluation. I do not believe it has anything to do with the breast cancer and that we should continue with her neoadjuvant chemotherapy plan as we had previously scheduled.   PREVENT: She has been enrolled in the PREVENT study. This study will assess whether Lipitor will help protect cardiac function in patients receiving Adriamycin chemotherapy. Study randomizes between placebo versus Lipitor.  Chemotherapy monitoring: 1. Chemotherapy education to be done today. 2. Echocardiogram 02/15/2015 EF 60-65% normal 3. Antiemetic regimen was discussed in detail  Patient will be getting married on Saturday. She will plan to start chemotherapy on Monday.

## 2015-02-17 ENCOUNTER — Telehealth: Payer: Self-pay | Admitting: Hematology and Oncology

## 2015-02-17 ENCOUNTER — Other Ambulatory Visit: Payer: Self-pay | Admitting: Hematology and Oncology

## 2015-02-17 ENCOUNTER — Other Ambulatory Visit: Payer: 59

## 2015-02-17 ENCOUNTER — Ambulatory Visit (HOSPITAL_BASED_OUTPATIENT_CLINIC_OR_DEPARTMENT_OTHER): Payer: 59 | Admitting: Hematology and Oncology

## 2015-02-17 ENCOUNTER — Encounter: Payer: 59 | Admitting: Medical Oncology

## 2015-02-17 ENCOUNTER — Other Ambulatory Visit: Payer: Self-pay | Admitting: Medical Oncology

## 2015-02-17 VITALS — BP 141/86 | HR 84 | Temp 98.3°F | Resp 18 | Ht 65.5 in | Wt 172.5 lb

## 2015-02-17 DIAGNOSIS — Z171 Estrogen receptor negative status [ER-]: Secondary | ICD-10-CM | POA: Diagnosis not present

## 2015-02-17 DIAGNOSIS — N2889 Other specified disorders of kidney and ureter: Secondary | ICD-10-CM

## 2015-02-17 DIAGNOSIS — C50411 Malignant neoplasm of upper-outer quadrant of right female breast: Secondary | ICD-10-CM | POA: Diagnosis not present

## 2015-02-17 DIAGNOSIS — Z006 Encounter for examination for normal comparison and control in clinical research program: Secondary | ICD-10-CM | POA: Diagnosis not present

## 2015-02-17 NOTE — Telephone Encounter (Signed)
Patient will get a new schedule/avs on 5/9 at chemo

## 2015-02-17 NOTE — Progress Notes (Signed)
Patient Care Team: [name], MD as PCP - General (Family Medicine) Chauncey Cruel, MD as Consulting Physician (Oncology) Thea Silversmith, MD as Consulting Physician (Radiation Oncology) Mauro Kaufmann, RN as Registered Nurse Rockwell Germany, RN as Registered Nurse Alphonsa Overall, MD as Consulting Physician (General Surgery)  DIAGNOSIS: Breast cancer of upper-outer quadrant of right female breast   Staging form: Breast, AJCC 7th Edition     Clinical stage from 02/08/2015: Stage IIIA (T3, N1, M0) - Unsigned   SUMMARY OF ONCOLOGIC HISTORY:   Breast cancer of upper-outer quadrant of right female breast   01/27/2015 Initial Diagnosis Right breast 9:00 and 10:00 biopsy: Grade 3 IDC ER/PR negative, HER-2 negative, Ki-67 77% and 73%; the right axillary lymph node biopsy: EF 22% positive   01/30/2015 Breast MRI Right breast masses 9 and 10:00 position, additional irregular masses with non-mass enhancement upper outer quadrant with one mass extending into upper inner quadrant, multifocal, multicentric 13.7 x 4.6 x 4.6 cm, malignant right axillary lymph node   02/15/2015 Imaging No evidence of metastatic disease, 2.2 cm enhancing lesion anterior left lower kidney suspicious for solid renal neoplasm    CHIEF COMPLIANT: Here to discuss CT scan results and chemotherapy plan  INTERVAL HISTORY: Phyllis Pruitt is a 44 year old American lady with above-mentioned history of right-sided breast cancer being treated with neoadjuvant chemotherapy starting Monday. She had a CT scan of the abdomen and pelvis and it showed a renal mass in the left kidney and is here today to discuss the result. So severe to get chemotherapy education. She is very busy with her wedding preparation. Denies any nausea or vomiting. She appears to be anxious. Her major concern is whether she can participate in sauna after doing chemotherapy.  REVIEW OF SYSTEMS:   Constitutional: Denies fevers, chills or abnormal weight loss Eyes:  Denies blurriness of vision Ears, nose, mouth, throat, and face: Denies mucositis or sore throat Respiratory: Denies cough, dyspnea or wheezes Cardiovascular: Denies palpitation, chest discomfort or lower extremity swelling Gastrointestinal:  Denies nausea, heartburn or change in bowel habits Skin: Denies abnormal skin rashes Lymphatics: Denies new lymphadenopathy or easy bruising Neurological:Denies numbness, tingling or new weaknesses Behavioral/Psych: Mood is stable, no new changes  Breast:  denies any pain or lumps or nodules in either breasts All other systems were reviewed with the patient and are negative.  I have reviewed the past medical history, past surgical history, social history and family history with the patient and they are unchanged from previous note.  ALLERGIES:  has No Known Allergies.  MEDICATIONS:  Current Outpatient Prescriptions  Medication Sig Dispense Refill  . dexamethasone (DECADRON) 4 MG tablet Take 1 tablets by mouth once a day on the day after chemotherapy and then take 1 tablets two times a day for 2 days. Take with food. 30 tablet 1  . diphenhydramine-acetaminophen (TYLENOL PM) 25-500 MG TABS Take 1 tablet by mouth at bedtime as needed (sleep).     Marland Kitchen ECHINACEA PO Take 1 tablet by mouth daily.     . Ginger, Zingiber officinalis, (GINGER ROOT PO) Take 1 tablet by mouth daily.     Marland Kitchen HYDROcodone-acetaminophen (NORCO/VICODIN) 5-325 MG per tablet Take 1-2 tablets by mouth every 6 (six) hours as needed for moderate pain. 30 tablet 0  . HYDROcodone-acetaminophen (NORCO/VICODIN) 5-325 MG per tablet     . lidocaine-prilocaine (EMLA) cream Apply to affected area once 30 g 3  . LORazepam (ATIVAN) 0.5 MG tablet Take 1 tablet (0.5 mg total)  by mouth at bedtime. 30 tablet 0  . Multiple Vitamin (MULTIVITAMIN WITH MINERALS) TABS tablet Take 1 tablet by mouth daily.    . ondansetron (ZOFRAN) 8 MG tablet Take 1 tablet (8 mg total) by mouth 2 (two) times daily as needed.  Start on the third day after chemotherapy. 30 tablet 1  . penicillin v potassium (VEETID) 500 MG tablet     . prochlorperazine (COMPAZINE) 10 MG tablet Take 1 tablet (10 mg total) by mouth every 6 (six) hours as needed (Nausea or vomiting). 30 tablet 1  . TURMERIC CURCUMIN PO Take 1 capsule by mouth daily.     . penicillin v potassium (VEETID) 500 MG tablet Take 1 tablet (500 mg total) by mouth 4 (four) times daily. 28 tablet 0   No current facility-administered medications for this visit.    PHYSICAL EXAMINATION: ECOG PERFORMANCE STATUS: 1 - Symptomatic but completely ambulatory  Filed Vitals:   02/17/15 0857  BP: 141/86  Pulse: 84  Temp: 98.3 F (36.8 C)  Resp: 18   Filed Weights   02/17/15 0857  Weight: 172 lb 8 oz (78.245 kg)    GENERAL:alert, no distress and comfortable SKIN: skin color, texture, turgor are normal, no rashes or significant lesions EYES: normal, Conjunctiva are pink and non-injected, sclera clear OROPHARYNX:no exudate, no erythema and lips, buccal mucosa, and tongue normal  NECK: supple, thyroid normal size, non-tender, without nodularity LYMPH:  no palpable lymphadenopathy in the cervical, axillary or inguinal LUNGS: clear to auscultation and percussion with normal breathing effort HEART: regular rate & rhythm and no murmurs and no lower extremity edema ABDOMEN:abdomen soft, non-tender and normal bowel sounds Musculoskeletal:no cyanosis of digits and no clubbing  NEURO: alert & oriented x 3 with fluent speech, no focal motor/sensory deficits  LABORATORY DATA:  I have reviewed the data as listed   Chemistry      Component Value Date/Time   NA 139 02/14/2015 1547   NA 139 02/09/2015 1259   K 3.5 02/14/2015 1547   K 4.2 02/09/2015 1259   CL 104 02/14/2015 1547   CO2 24 02/14/2015 1547   CO2 22 02/09/2015 1259   BUN 5* 02/14/2015 1547   BUN 4.3* 02/09/2015 1259   CREATININE 0.65 02/14/2015 1547   CREATININE 0.8 02/09/2015 1259      Component  Value Date/Time   CALCIUM 9.3 02/14/2015 1547   CALCIUM 9.6 02/09/2015 1259   ALKPHOS 61 02/14/2015 1547   ALKPHOS 65 02/09/2015 1259   AST 15 02/14/2015 1547   AST 11 02/09/2015 1259   ALT 12* 02/14/2015 1547   ALT 12 02/09/2015 1259   BILITOT 0.7 02/14/2015 1547   BILITOT 0.34 02/09/2015 1259       Lab Results  Component Value Date   WBC 8.7 02/14/2015   HGB 11.1* 02/14/2015   HCT 33.7* 02/14/2015   MCV 81.0 02/14/2015   PLT 275 02/14/2015   NEUTROABS 4.9 02/09/2015     RADIOGRAPHIC STUDIES: I have personally reviewed the radiology reports and agreed with their findings. Nm Bone Scan Whole Body  02/16/2015   CLINICAL DATA:  New diagnosis breast cancer.  EXAM: NUCLEAR MEDICINE WHOLE BODY BONE SCAN  TECHNIQUE: Whole body anterior and posterior images were obtained approximately 3 hours after intravenous injection of radiopharmaceutical.  RADIOPHARMACEUTICALS:  26.6 Technetium-55m MDP IV  COMPARISON:  None  PCWG2 Measurements for Bone Metastasis:  Baseline scan  No evidence of skeletal metastasis.  FINDINGS: There is a mottled appearance to the bones  which is felt to relate to attenuation artifact rather than diffuse skeletal metastasis. This uptake at the sternoclavicular joints and sternum in a radial joint which are felt to be degenerative. Uptake in the mandible is felt to be odontogenic.  IMPRESSION: No convincing evidence skeletal metastasis.   Electronically Signed   By: Suzy Bouchard M.D.   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  Left renal mass: I reviewed the CT scan with the patient and provided with a copy of this report. We will refer her to urology for further evaluation. I do not believe it has anything to do with the breast cancer and that we should continue with her neoadjuvant chemotherapy plan as we had previously scheduled.   PREVENT: She has been enrolled in the PREVENT study. This study will assess whether Lipitor will help protect cardiac function in patients receiving Adriamycin chemotherapy. Study randomizes between placebo versus Lipitor.  Chemotherapy monitoring: 1. Chemotherapy education to be done today. 2. Echocardiogram 02/15/2015 EF 60-65% normal 3. Antiemetic regimen was discussed in detail  Patient will be getting married on Saturday. She will plan to start chemotherapy on Monday.     return to clinic 1 week after starting chemotherapy for toxicity check  No orders of the defined types were placed in this encounter.   The patient has a good understanding of the overall plan. she agrees with it. she will call with any problems that may develop before the next visit here.   Rulon Eisenmenger, MD

## 2015-02-20 ENCOUNTER — Encounter: Payer: Self-pay | Admitting: Medical Oncology

## 2015-02-20 ENCOUNTER — Encounter: Payer: Self-pay | Admitting: Hematology and Oncology

## 2015-02-20 ENCOUNTER — Encounter: Payer: Self-pay | Admitting: *Deleted

## 2015-02-20 ENCOUNTER — Ambulatory Visit (HOSPITAL_BASED_OUTPATIENT_CLINIC_OR_DEPARTMENT_OTHER): Payer: 59

## 2015-02-20 VITALS — BP 139/93 | HR 87 | Temp 98.3°F | Resp 19

## 2015-02-20 DIAGNOSIS — C50411 Malignant neoplasm of upper-outer quadrant of right female breast: Secondary | ICD-10-CM

## 2015-02-20 DIAGNOSIS — Z5189 Encounter for other specified aftercare: Secondary | ICD-10-CM | POA: Diagnosis not present

## 2015-02-20 DIAGNOSIS — Z5111 Encounter for antineoplastic chemotherapy: Secondary | ICD-10-CM | POA: Diagnosis not present

## 2015-02-20 MED ORDER — INV-ATORVASTATIN/PLACEBO 40 MG TABS WAKE FOREST WF 98213: 1.0000 | ORAL_TABLET | Freq: Every day | ORAL | Status: DC

## 2015-02-20 MED ORDER — HEPARIN SOD (PORK) LOCK FLUSH 100 UNIT/ML IV SOLN
500.0000 [IU] | Freq: Once | INTRAVENOUS | Status: AC | PRN
  Administered 2015-02-20: 500 [IU]
  Filled 2015-02-20: qty 5

## 2015-02-20 MED ORDER — PALONOSETRON HCL INJECTION 0.25 MG/5ML
INTRAVENOUS | Status: AC
  Filled 2015-02-20: qty 5

## 2015-02-20 MED ORDER — PALONOSETRON HCL INJECTION 0.25 MG/5ML
0.2500 mg | Freq: Once | INTRAVENOUS | Status: AC
  Administered 2015-02-20: 0.25 mg via INTRAVENOUS

## 2015-02-20 MED ORDER — DOXORUBICIN HCL CHEMO IV INJECTION 2 MG/ML
60.0000 mg/m2 | Freq: Once | INTRAVENOUS | Status: AC
  Administered 2015-02-20: 116 mg via INTRAVENOUS
  Filled 2015-02-20: qty 58

## 2015-02-20 MED ORDER — PEGFILGRASTIM 6 MG/0.6ML ~~LOC~~ PSKT
6.0000 mg | PREFILLED_SYRINGE | Freq: Once | SUBCUTANEOUS | Status: AC
  Administered 2015-02-20: 6 mg via SUBCUTANEOUS
  Filled 2015-02-20: qty 0.6

## 2015-02-20 MED ORDER — SODIUM CHLORIDE 0.9 % IV SOLN
Freq: Once | INTRAVENOUS | Status: AC
  Administered 2015-02-20: 09:00:00 via INTRAVENOUS
  Filled 2015-02-20: qty 5

## 2015-02-20 MED ORDER — SODIUM CHLORIDE 0.9 % IJ SOLN
10.0000 mL | INTRAMUSCULAR | Status: DC | PRN
  Administered 2015-02-20: 10 mL
  Filled 2015-02-20: qty 10

## 2015-02-20 MED ORDER — SODIUM CHLORIDE 0.9 % IV SOLN
Freq: Once | INTRAVENOUS | Status: AC
Start: 2015-02-20 — End: 2015-02-20
  Administered 2015-02-20: 08:00:00 via INTRAVENOUS

## 2015-02-20 MED ORDER — SODIUM CHLORIDE 0.9 % IV SOLN
600.0000 mg/m2 | Freq: Once | INTRAVENOUS | Status: AC
  Administered 2015-02-20: 1160 mg via INTRAVENOUS
  Filled 2015-02-20: qty 58

## 2015-02-20 NOTE — Patient Instructions (Signed)
[facility] Discharge Instructions for Patients Receiving Chemotherapy  Today you received the following chemotherapy agents: Adriamycin, Cytoxan  To help prevent nausea and vomiting after your treatment, we encourage you to take your nausea medication as prescribed by your physician: Compazine 10 mg every 6 hrs as needed.   If you develop nausea and vomiting that is not controlled by your nausea medication, call the clinic.   BELOW ARE SYMPTOMS THAT SHOULD BE REPORTED IMMEDIATELY:  *FEVER GREATER THAN 100.5 F  *CHILLS WITH OR WITHOUT FEVER  NAUSEA AND VOMITING THAT IS NOT CONTROLLED WITH YOUR NAUSEA MEDICATION  *UNUSUAL SHORTNESS OF BREATH  *UNUSUAL BRUISING OR BLEEDING  TENDERNESS IN MOUTH AND THROAT WITH OR WITHOUT PRESENCE OF ULCERS  *URINARY PROBLEMS  *BOWEL PROBLEMS  UNUSUAL RASH Items with * indicate a potential emergency and should be followed up as soon as possible.  Feel free to call the clinic you have any questions or concerns. The clinic phone number is [phone].  Please show the [facility] at check-in to the Emergency Department and triage nurse.

## 2015-02-20 NOTE — Progress Notes (Signed)
[facility] PREVENT Patient here this morning for first chemotherapy treatment. Met with patient and spouse in treatment room. Confirmed with patient that she has taken one study drug/placebo tablet on Saturday (9:30 PM) and one on Sunday (8:30 PM), giving her two doses prior to the start of chemotherapy. Patient declines having any problems with taking drug and states she wrote on her calendar that she did take it. Patient confirms that she will write on her study provided calendars any s/e she notes. I reviewed with patient that she can take study drug/placebo with/without food and that should she vomit after taking a pill she is not to take another and to report any muscle aches/pain to MD. Patient gave verbal confirmation of understanding. Patient denies questions at this time and knows that I will be collecting her calendars in two months. I thanked patient for her time and willingness to participate in study and encouraged her to call me or Dr. [name] with any questions or concerns that she may have.  Return appointments 05/16 lab/Dr. [name]. Adele Dan, RN, Clinical Research 02/20/2015 11:11 AM

## 2015-02-20 NOTE — Progress Notes (Signed)
Enrolled pt in the [employer] First [employer] program.  Faxed signed form and activated card today.  °

## 2015-02-20 NOTE — Progress Notes (Signed)
[facility] blood return noted before, during and after Adriamycin. 

## 2015-02-20 NOTE — Progress Notes (Signed)
Spoke with patient during her 1st treatment today.  She is doing well. She got married this weekend.  Encouraged patient to call with any needs or concerns.

## 2015-02-21 ENCOUNTER — Telehealth: Payer: Self-pay | Admitting: *Deleted

## 2015-02-21 NOTE — Telephone Encounter (Signed)
Patient received 1st AC yesterday. Patient denies any nausea, vomiting or diarrhea. States that she is able to eat and drink without any problems. Patient advised to call with any questions or concerns. Patient verbalized understanding.

## 2015-02-24 ENCOUNTER — Telehealth: Payer: Self-pay | Admitting: *Deleted

## 2015-02-24 NOTE — Telephone Encounter (Signed)
Advised patient that she can take any OTC stool softener or Miralax. Also advised her that taking the decadron can cause the heartburn and she can take pepcid twice a day for a week. If her symptoms do not improve, to call us back. Patient verbalized understanding.

## 2015-02-24 NOTE — Telephone Encounter (Signed)
PT. RECEIVED ADRIAMYCIN ON 02/20/15. HER LAST GOOD BOWEL MOVEMENT WAS 02/20/15. SHE HAS NOT TAKEN ANYTHING FOR THE CONSTIPATION. PT. HAS DISIMPACTED HARD BALLS WHICH SHE STATES "HELPS THE HEART BURN". ANY SUGGESTIONS?

## 2015-02-26 NOTE — Assessment & Plan Note (Addendum)
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 Patient enrolled in PREVENT clinical trial: Lipitor vs Placebo  Chemo toxicities:  RTC in 1 week for cycle 2.

## 2015-02-27 ENCOUNTER — Other Ambulatory Visit (HOSPITAL_BASED_OUTPATIENT_CLINIC_OR_DEPARTMENT_OTHER): Payer: 59

## 2015-02-27 ENCOUNTER — Telehealth: Payer: Self-pay | Admitting: Hematology and Oncology

## 2015-02-27 ENCOUNTER — Ambulatory Visit (HOSPITAL_BASED_OUTPATIENT_CLINIC_OR_DEPARTMENT_OTHER): Payer: 59 | Admitting: Hematology and Oncology

## 2015-02-27 VITALS — BP 136/78 | HR 75 | Temp 97.8°F | Resp 18 | Ht 65.5 in | Wt 175.5 lb

## 2015-02-27 DIAGNOSIS — N289 Disorder of kidney and ureter, unspecified: Secondary | ICD-10-CM

## 2015-02-27 DIAGNOSIS — C50411 Malignant neoplasm of upper-outer quadrant of right female breast: Secondary | ICD-10-CM

## 2015-02-27 DIAGNOSIS — Z171 Estrogen receptor negative status [ER-]: Secondary | ICD-10-CM | POA: Diagnosis not present

## 2015-02-27 LAB — CBC WITH DIFFERENTIAL/PLATELET
BASO%: 0.7 % (ref 0.0–2.0)
Basophils Absolute: 0 10*3/uL (ref 0.0–0.1)
EOS ABS: 0.1 10*3/uL (ref 0.0–0.5)
EOS%: 1.5 % (ref 0.0–7.0)
HCT: 31.1 % — ABNORMAL LOW (ref 34.8–46.6)
HGB: 10.2 g/dL — ABNORMAL LOW (ref 11.6–15.9)
LYMPH%: 23.1 % (ref 14.0–49.7)
MCH: 26.7 pg (ref 25.1–34.0)
MCHC: 32.7 g/dL (ref 31.5–36.0)
MCV: 81.7 fL (ref 79.5–101.0)
MONO#: 0.4 10*3/uL (ref 0.1–0.9)
MONO%: 8.2 % (ref 0.0–14.0)
NEUT%: 66.5 % (ref 38.4–76.8)
NEUTROS ABS: 3 10*3/uL (ref 1.5–6.5)
Platelets: 253 10*3/uL (ref 145–400)
RBC: 3.81 10*6/uL (ref 3.70–5.45)
RDW: 15 % — ABNORMAL HIGH (ref 11.2–14.5)
WBC: 4.6 10*3/uL (ref 3.9–10.3)
lymph#: 1.1 10*3/uL (ref 0.9–3.3)

## 2015-02-27 LAB — COMPREHENSIVE METABOLIC PANEL (CC13)
ALBUMIN: 3.3 g/dL — AB (ref 3.5–5.0)
ALK PHOS: 87 U/L (ref 40–150)
ALT: 11 U/L (ref 0–55)
AST: 11 U/L (ref 5–34)
Anion Gap: 9 mEq/L (ref 3–11)
BUN: 7.2 mg/dL (ref 7.0–26.0)
CALCIUM: 9.5 mg/dL (ref 8.4–10.4)
CO2: 28 mEq/L (ref 22–29)
Chloride: 104 mEq/L (ref 98–109)
Creatinine: 0.7 mg/dL (ref 0.6–1.1)
EGFR: >90 mL/min/{1.73_m2} (ref >90)
GLUCOSE: 99 mg/dL (ref 70–140)
Potassium: 3.6 mEq/L (ref 3.5–5.1)
SODIUM: 141 meq/L (ref 136–145)
TOTAL PROTEIN: 6.8 g/dL (ref 6.4–8.3)

## 2015-02-27 NOTE — Progress Notes (Signed)
Patient Care Team: [name], MD as PCP - General (Family Medicine) Chauncey Cruel, MD as Consulting Physician (Oncology) Thea Silversmith, MD as Consulting Physician (Radiation Oncology) Mauro Kaufmann, RN as Registered Nurse Rockwell Germany, RN as Registered Nurse Alphonsa Overall, MD as Consulting Physician (General Surgery)  DIAGNOSIS: Breast cancer of upper-outer quadrant of right female breast   Staging form: Breast, AJCC 7th Edition     Clinical stage from 02/08/2015: Stage IIIA (T3, N1, M0) - Unsigned   SUMMARY OF ONCOLOGIC HISTORY:   Breast cancer of upper-outer quadrant of right female breast   01/27/2015 Initial Diagnosis Right breast 9:00 and 10:00 biopsy: Grade 3 IDC ER/PR negative, HER-2 negative, Ki-67 77% and 73%; the right axillary lymph node biopsy: EF 22% positive   01/30/2015 Breast MRI Right breast masses 9 and 10:00 position, additional irregular masses with non-mass enhancement upper outer quadrant with one mass extending into upper inner quadrant, multifocal, multicentric 13.7 x 4.6 x 4.6 cm, malignant right axillary lymph node   02/15/2015 Imaging No evidence of metastatic disease, 2.2 cm enhancing lesion anterior left lower kidney suspicious for solid renal neoplasm   02/20/2015 -  Neo-Adjuvant Chemotherapy Adriamycin and cytoxan dose dense Q 2 weeks X 4 foll by Abraxane weekly x 12    CHIEF COMPLIANT: cycle 1 day 8 Adriamycin and Cytoxan toxicity check  INTERVAL HISTORY: Phyllis Pruitt is a40 year old lady with above-mentioned history of right-sided breast cancer currently on neoadjuvant chemotherapy with Adriamycin and Cytoxan. Today cycle 1 day 8. She has tolerated cycle 1 fairly well except for constipation for which she had used magnesium citrate. Once she use that she had good bowel movement. Because of the constipation she was having mild nausea especially to water. She was also having heartburn related to steroid treatments. She recently got  married.  REVIEW OF SYSTEMS:   Constitutional: Denies fevers, chills or abnormal weight loss Eyes: Denies blurriness of vision Ears, nose, mouth, throat, and face: Denies mucositis or sore throat Respiratory: Denies cough, dyspnea or wheezes Cardiovascular: Denies palpitation, chest discomfort or lower extremity swelling Gastrointestinal:  Denies nausea, heartburn or change in bowel habits Skin: Denies abnormal skin rashes Lymphatics: Denies new lymphadenopathy or easy bruising Neurological:Denies numbness, tingling or new weaknesses Behavioral/Psych: Mood is stable, no new changes  Breast:  denies any pain or lumps or nodules in either breasts All other systems were reviewed with the patient and are negative.  I have reviewed the past medical history, past surgical history, social history and family history with the patient and they are unchanged from previous note.  ALLERGIES:  has No Known Allergies.  MEDICATIONS:  Current Outpatient Prescriptions  Medication Sig Dispense Refill  . Atorvastatin Calcium (INVESTIGATIONAL ATORVASTATIN/PLACEBO) 40 MG tablet [facility] [id number] Take 1 tablet by mouth daily. Take 2 doses (these doses must be 12 hours apart) prior to first chemotherapy treatment. Then take 1 tablet daily by mouth with or without food. 180 tablet 0  . dexamethasone (DECADRON) 4 MG tablet Take 1 tablets by mouth once a day on the day after chemotherapy and then take 1 tablets two times a day for 2 days. Take with food. 30 tablet 1  . diphenhydramine-acetaminophen (TYLENOL PM) 25-500 MG TABS Take 1 tablet by mouth at bedtime as needed (sleep).     Marland Kitchen ECHINACEA PO Take 1 tablet by mouth daily.     . Ginger, Zingiber officinalis, (GINGER ROOT PO) Take 1 tablet by mouth daily.     Marland Kitchen  HYDROcodone-acetaminophen (NORCO/VICODIN) 5-325 MG per tablet Take 1-2 tablets by mouth every 6 (six) hours as needed for moderate pain. 30 tablet 0  . HYDROcodone-acetaminophen (NORCO/VICODIN) 5-325  MG per tablet     . lidocaine-prilocaine (EMLA) cream Apply to affected area once 30 g 3  . LORazepam (ATIVAN) 0.5 MG tablet Take 1 tablet (0.5 mg total) by mouth at bedtime. 30 tablet 0  . Multiple Vitamin (MULTIVITAMIN WITH MINERALS) TABS tablet Take 1 tablet by mouth daily.    . ondansetron (ZOFRAN) 8 MG tablet Take 1 tablet (8 mg total) by mouth 2 (two) times daily as needed. Start on the third day after chemotherapy. 30 tablet 1  . penicillin v potassium (VEETID) 500 MG tablet     . prochlorperazine (COMPAZINE) 10 MG tablet Take 1 tablet (10 mg total) by mouth every 6 (six) hours as needed (Nausea or vomiting). 30 tablet 1  . TURMERIC CURCUMIN PO Take 1 capsule by mouth daily.      No current facility-administered medications for this visit.    PHYSICAL EXAMINATION: ECOG PERFORMANCE STATUS: 1 - Symptomatic but completely ambulatory  Filed Vitals:   02/27/15 1500  BP: 136/78  Pulse: 75  Temp: 97.8 F (36.6 C)  Resp: 18   Filed Weights   02/27/15 1500  Weight: 175 lb 8 oz (79.606 kg)    GENERAL:alert, no distress and comfortable SKIN: skin color, texture, turgor are normal, no rashes or significant lesions EYES: normal, Conjunctiva are pink and non-injected, sclera clear OROPHARYNX:no exudate, no erythema and lips, buccal mucosa, and tongue normal  NECK: supple, thyroid normal size, non-tender, without nodularity LYMPH:  no palpable lymphadenopathy in the cervical, axillary or inguinal LUNGS: clear to auscultation and percussion with normal breathing effort HEART: regular rate & rhythm and no murmurs and no lower extremity edema ABDOMEN:abdomen soft, non-tender and normal bowel sounds Musculoskeletal:no cyanosis of digits and no clubbing  NEURO: alert & oriented x 3 with fluent speech, no focal motor/sensory deficits  LABORATORY DATA:  I have reviewed the data as listed   Chemistry      Component Value Date/Time   NA 139 02/14/2015 1547   NA 139 02/09/2015 1259   K  3.5 02/14/2015 1547   K 4.2 02/09/2015 1259   CL 104 02/14/2015 1547   CO2 24 02/14/2015 1547   CO2 22 02/09/2015 1259   BUN 5* 02/14/2015 1547   BUN 4.3* 02/09/2015 1259   CREATININE 0.65 02/14/2015 1547   CREATININE 0.8 02/09/2015 1259      Component Value Date/Time   CALCIUM 9.3 02/14/2015 1547   CALCIUM 9.6 02/09/2015 1259   ALKPHOS 61 02/14/2015 1547   ALKPHOS 65 02/09/2015 1259   AST 15 02/14/2015 1547   AST 11 02/09/2015 1259   ALT 12* 02/14/2015 1547   ALT 12 02/09/2015 1259   BILITOT 0.7 02/14/2015 1547   BILITOT 0.34 02/09/2015 1259       Lab Results  Component Value Date   WBC 4.6 02/27/2015   HGB 10.2* 02/27/2015   HCT 31.1* 02/27/2015   MCV 81.7 02/27/2015   PLT 253 02/27/2015   NEUTROABS 3.0 02/27/2015    ASSESSMENT & PLAN:  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 Patient enrolled in PREVENT clinical trial: Lipitor vs Placebo  Chemo toxicities: 1. Constipation 2. Heartburn 3. Grade 1 nausea related to constipation 4. Grade 1 anemia: With a history of sickle cell trait. We will monitor this  Blood counts reviewed and there is no evidence of neutropenia.  RTC in 1 week for cycle 2.    No orders of the defined types were placed in this encounter.   The patient has a good understanding of the overall plan. she agrees with it. she will call with any problems that may develop before the next visit here.   Rulon Eisenmenger, MD

## 2015-02-27 NOTE — Telephone Encounter (Signed)
Gave avs & calendar for May. Sent message to adjust treatment. °

## 2015-02-28 ENCOUNTER — Telehealth: Payer: Self-pay | Admitting: *Deleted

## 2015-02-28 NOTE — Telephone Encounter (Signed)
I have adjusted 5/23 appt

## 2015-03-01 ENCOUNTER — Other Ambulatory Visit: Payer: Self-pay | Admitting: Hematology and Oncology

## 2015-03-05 NOTE — Assessment & Plan Note (Signed)
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 Patient enrolled in PREVENT clinical trial: Lipitor vs Placebo  Chemo toxicities: 1. Constipation 2. Heartburn 3. Grade 1 nausea related to constipation 4. Grade 1 anemia: With a history of sickle cell trait. We will monitor this  Blood counts reviewed and there is no evidence of neutropenia.  RTC in 1 week for cycle 3

## 2015-03-06 ENCOUNTER — Telehealth: Payer: Self-pay | Admitting: Hematology and Oncology

## 2015-03-06 ENCOUNTER — Ambulatory Visit (HOSPITAL_BASED_OUTPATIENT_CLINIC_OR_DEPARTMENT_OTHER): Payer: 59 | Admitting: Hematology and Oncology

## 2015-03-06 ENCOUNTER — Other Ambulatory Visit (HOSPITAL_BASED_OUTPATIENT_CLINIC_OR_DEPARTMENT_OTHER): Payer: 59

## 2015-03-06 ENCOUNTER — Ambulatory Visit (HOSPITAL_BASED_OUTPATIENT_CLINIC_OR_DEPARTMENT_OTHER): Payer: 59

## 2015-03-06 ENCOUNTER — Encounter: Payer: Self-pay | Admitting: Medical Oncology

## 2015-03-06 VITALS — BP 135/82 | HR 84 | Temp 98.4°F | Resp 18 | Ht 65.5 in | Wt 177.4 lb

## 2015-03-06 DIAGNOSIS — C50411 Malignant neoplasm of upper-outer quadrant of right female breast: Secondary | ICD-10-CM

## 2015-03-06 DIAGNOSIS — Z5111 Encounter for antineoplastic chemotherapy: Secondary | ICD-10-CM

## 2015-03-06 DIAGNOSIS — Z006 Encounter for examination for normal comparison and control in clinical research program: Secondary | ICD-10-CM

## 2015-03-06 DIAGNOSIS — N289 Disorder of kidney and ureter, unspecified: Secondary | ICD-10-CM

## 2015-03-06 DIAGNOSIS — Z171 Estrogen receptor negative status [ER-]: Secondary | ICD-10-CM

## 2015-03-06 DIAGNOSIS — Z5189 Encounter for other specified aftercare: Secondary | ICD-10-CM | POA: Diagnosis not present

## 2015-03-06 LAB — COMPREHENSIVE METABOLIC PANEL (CC13)
ALBUMIN: 3.4 g/dL — AB (ref 3.5–5.0)
ALK PHOS: 77 U/L (ref 40–150)
ALT: 22 U/L (ref 0–55)
ANION GAP: 12 meq/L — AB (ref 3–11)
AST: 15 U/L (ref 5–34)
BUN: 7.6 mg/dL (ref 7.0–26.0)
CO2: 22 meq/L (ref 22–29)
Calcium: 8.5 mg/dL (ref 8.4–10.4)
Chloride: 107 mEq/L (ref 98–109)
Creatinine: 0.8 mg/dL (ref 0.6–1.1)
EGFR: >90 mL/min/{1.73_m2} (ref >90)
GLUCOSE: 92 mg/dL (ref 70–140)
Potassium: 3.7 mEq/L (ref 3.5–5.1)
Sodium: 140 mEq/L (ref 136–145)
Total Bilirubin: 0.2 mg/dL (ref 0.20–1.20)
Total Protein: 6.9 g/dL (ref 6.4–8.3)

## 2015-03-06 LAB — CBC WITH DIFFERENTIAL/PLATELET
BASO%: 0.6 % (ref 0.0–2.0)
BASOS ABS: 0.1 10*3/uL (ref 0.0–0.1)
EOS ABS: 0 10*3/uL (ref 0.0–0.5)
EOS%: 0 % (ref 0.0–7.0)
HCT: 31.7 % — ABNORMAL LOW (ref 34.8–46.6)
HEMOGLOBIN: 10.7 g/dL — AB (ref 11.6–15.9)
LYMPH%: 15.6 % (ref 14.0–49.7)
MCH: 27.4 pg (ref 25.1–34.0)
MCHC: 33.8 g/dL (ref 31.5–36.0)
MCV: 81.1 fL (ref 79.5–101.0)
MONO#: 0.7 10*3/uL (ref 0.1–0.9)
MONO%: 8.3 % (ref 0.0–14.0)
NEUT%: 75.5 % (ref 38.4–76.8)
NEUTROS ABS: 6.3 10*3/uL (ref 1.5–6.5)
PLATELETS: 163 10*3/uL (ref 145–400)
RBC: 3.91 10*6/uL (ref 3.70–5.45)
RDW: 15.4 % — AB (ref 11.2–14.5)
WBC: 8.4 10*3/uL (ref 3.9–10.3)
lymph#: 1.3 10*3/uL (ref 0.9–3.3)

## 2015-03-06 MED ORDER — LORAZEPAM 0.5 MG PO TABS: 0.5000 mg | ORAL_TABLET | Freq: Every day | ORAL | Status: DC

## 2015-03-06 MED ORDER — SODIUM CHLORIDE 0.9 % IV SOLN
600.0000 mg/m2 | Freq: Once | INTRAVENOUS | Status: AC
  Administered 2015-03-06: 1160 mg via INTRAVENOUS
  Filled 2015-03-06: qty 58

## 2015-03-06 MED ORDER — HEPARIN SOD (PORK) LOCK FLUSH 100 UNIT/ML IV SOLN
500.0000 [IU] | Freq: Once | INTRAVENOUS | Status: AC | PRN
  Administered 2015-03-06: 500 [IU]
  Filled 2015-03-06: qty 5

## 2015-03-06 MED ORDER — DOXORUBICIN HCL CHEMO IV INJECTION 2 MG/ML
60.0000 mg/m2 | Freq: Once | INTRAVENOUS | Status: AC
  Administered 2015-03-06: 116 mg via INTRAVENOUS
  Filled 2015-03-06: qty 58

## 2015-03-06 MED ORDER — PEGFILGRASTIM 6 MG/0.6ML ~~LOC~~ PSKT
6.0000 mg | PREFILLED_SYRINGE | Freq: Once | SUBCUTANEOUS | Status: AC
  Administered 2015-03-06: 6 mg via SUBCUTANEOUS
  Filled 2015-03-06: qty 0.6

## 2015-03-06 MED ORDER — PALONOSETRON HCL INJECTION 0.25 MG/5ML
0.2500 mg | Freq: Once | INTRAVENOUS | Status: AC
  Administered 2015-03-06: 0.25 mg via INTRAVENOUS

## 2015-03-06 MED ORDER — SODIUM CHLORIDE 0.9 % IJ SOLN
10.0000 mL | INTRAMUSCULAR | Status: DC | PRN
  Administered 2015-03-06: 10 mL
  Filled 2015-03-06: qty 10

## 2015-03-06 MED ORDER — SODIUM CHLORIDE 0.9 % IV SOLN
Freq: Once | INTRAVENOUS | Status: AC
  Administered 2015-03-06: 09:00:00 via INTRAVENOUS

## 2015-03-06 MED ORDER — PALONOSETRON HCL INJECTION 0.25 MG/5ML
INTRAVENOUS | Status: AC
  Filled 2015-03-06: qty 5

## 2015-03-06 MED ORDER — SODIUM CHLORIDE 0.9 % IV SOLN
Freq: Once | INTRAVENOUS | Status: AC
  Administered 2015-03-06: 09:00:00 via INTRAVENOUS
  Filled 2015-03-06: qty 5

## 2015-03-06 NOTE — Patient Instructions (Signed)
[facility] Discharge Instructions for Patients Receiving Chemotherapy  Today you received the following chemotherapy agents:  Adriamycin and Cytoxan  To help prevent nausea and vomiting after your treatment, we encourage you to take your nausea medication as ordered per MD.   If you develop nausea and vomiting that is not controlled by your nausea medication, call the clinic.   BELOW ARE SYMPTOMS THAT SHOULD BE REPORTED IMMEDIATELY:  *FEVER GREATER THAN 100.5 F  *CHILLS WITH OR WITHOUT FEVER  NAUSEA AND VOMITING THAT IS NOT CONTROLLED WITH YOUR NAUSEA MEDICATION  *UNUSUAL SHORTNESS OF BREATH  *UNUSUAL BRUISING OR BLEEDING  TENDERNESS IN MOUTH AND THROAT WITH OR WITHOUT PRESENCE OF ULCERS  *URINARY PROBLEMS  *BOWEL PROBLEMS  UNUSUAL RASH Items with * indicate a potential emergency and should be followed up as soon as possible.  Feel free to call the clinic you have any questions or concerns. The clinic phone number is [phone].  Please show the [facility] at check-in to the Emergency Department and triage nurse.

## 2015-03-06 NOTE — Telephone Encounter (Signed)
lvm for pt regarding to added appt...advised pt the she will get print out in tx.

## 2015-03-06 NOTE — Addendum Note (Signed)
Addended by: Prentiss Bells on: 03/06/2015 09:40 AM   Modules accepted: Orders

## 2015-03-06 NOTE — Progress Notes (Signed)
[facility] PREVENT Patient here for treatment this morning. Met with patient and spouse in treatment room to inquire as to how she is doing and if she is having any difficulty with study drug/placebo. Patient with no complaints and denies any s/e. States she is taking study drug/placebo daily as instructed. Denies questions at this time. I encouraged patient to call me or Dr. [name] with any questions or concerns and thanked her for her time. Adele Dan, RN, Clinical Research 03/06/2015 9:50 AM

## 2015-03-06 NOTE — Progress Notes (Signed)
Patient Care Team: [name], MD as PCP - General (Family Medicine) Chauncey Cruel, MD as Consulting Physician (Oncology) Thea Silversmith, MD as Consulting Physician (Radiation Oncology) Mauro Kaufmann, RN as Registered Nurse Rockwell Germany, RN as Registered Nurse Alphonsa Overall, MD as Consulting Physician (General Surgery)  DIAGNOSIS: Breast cancer of upper-outer quadrant of right female breast   Staging form: Breast, AJCC 7th Edition     Clinical stage from 02/08/2015: Stage IIIA (T3, N1, M0) - Unsigned   SUMMARY OF ONCOLOGIC HISTORY:   Breast cancer of upper-outer quadrant of right female breast   01/27/2015 Initial Diagnosis Right breast 9:00 and 10:00 biopsy: Grade 3 IDC ER/PR negative, HER-2 negative, Ki-67 77% and 73%; the right axillary lymph node biopsy: EF 22% positive   01/30/2015 Breast MRI Right breast masses 9 and 10:00 position, additional irregular masses with non-mass enhancement upper outer quadrant with one mass extending into upper inner quadrant, multifocal, multicentric 13.7 x 4.6 x 4.6 cm, malignant right axillary lymph node   02/15/2015 Imaging No evidence of metastatic disease, 2.2 cm enhancing lesion anterior left lower kidney suspicious for solid renal neoplasm   02/20/2015 -  Neo-Adjuvant Chemotherapy Adriamycin and cytoxan dose dense Q 2 weeks X 4 foll by Abraxane weekly x 12    CHIEF COMPLIANT: Cycle 2 dose dense Adriamycin and Cytoxan  INTERVAL HISTORY: Phyllis Pruitt is a 44 year old with above-mentioned history of right breast cancer currently on neoadjuvant chemotherapy. Today is cycle 2 of treatment. Last week she had done extremely well without any further problems. She is losing her hair. Denies any further problems with constipation or nausea.  REVIEW OF SYSTEMS:   Constitutional: Denies fevers, chills or abnormal weight loss Eyes: Denies blurriness of vision Ears, nose, mouth, throat, and face: Denies mucositis or sore throat Respiratory: Denies  cough, dyspnea or wheezes Cardiovascular: Denies palpitation, chest discomfort or lower extremity swelling Gastrointestinal:  Denies nausea, heartburn or change in bowel habits Skin: Denies abnormal skin rashes Lymphatics: Denies new lymphadenopathy or easy bruising Neurological:Denies numbness, tingling or new weaknesses Behavioral/Psych: Mood is stable, no new changes  All other systems were reviewed with the patient and are negative.  I have reviewed the past medical history, past surgical history, social history and family history with the patient and they are unchanged from previous note.  ALLERGIES:  has No Known Allergies.  MEDICATIONS:  Current Outpatient Prescriptions  Medication Sig Dispense Refill  . Atorvastatin Calcium (INVESTIGATIONAL ATORVASTATIN/PLACEBO) 40 MG tablet [facility] [address] Take 1 tablet by mouth daily. Take 2 doses (these doses must be 12 hours apart) prior to first chemotherapy treatment. Then take 1 tablet daily by mouth with or without food. 180 tablet 0  . dexamethasone (DECADRON) 4 MG tablet Take 1 tablets by mouth once a day on the day after chemotherapy and then take 1 tablets two times a day for 2 days. Take with food. 30 tablet 1  . diphenhydramine-acetaminophen (TYLENOL PM) 25-500 MG TABS Take 1 tablet by mouth at bedtime as needed (sleep).     Marland Kitchen ECHINACEA PO Take 1 tablet by mouth daily.     . Ginger, Zingiber officinalis, (GINGER ROOT PO) Take 1 tablet by mouth daily.     Marland Kitchen HYDROcodone-acetaminophen (NORCO/VICODIN) 5-325 MG per tablet Take 1-2 tablets by mouth every 6 (six) hours as needed for moderate pain. 30 tablet 0  . HYDROcodone-acetaminophen (NORCO/VICODIN) 5-325 MG per tablet     . lidocaine-prilocaine (EMLA) cream Apply to affected area  once 30 g 3  . LORazepam (ATIVAN) 0.5 MG tablet Take 1 tablet (0.5 mg total) by mouth at bedtime. 30 tablet 0  . Multiple Vitamin (MULTIVITAMIN WITH MINERALS) TABS tablet Take 1 tablet by mouth daily.    .  ondansetron (ZOFRAN) 8 MG tablet Take 1 tablet (8 mg total) by mouth 2 (two) times daily as needed. Start on the third day after chemotherapy. 30 tablet 1  . prochlorperazine (COMPAZINE) 10 MG tablet Take 1 tablet (10 mg total) by mouth every 6 (six) hours as needed (Nausea or vomiting). 30 tablet 1  . TURMERIC CURCUMIN PO Take 1 capsule by mouth daily.     . penicillin v potassium (VEETID) 500 MG tablet      No current facility-administered medications for this visit.    PHYSICAL EXAMINATION: ECOG PERFORMANCE STATUS: 1 - Symptomatic but completely ambulatory  Filed Vitals:   03/06/15 0815  BP: 135/82  Pulse: 84  Temp: 98.4 F (36.9 C)  Resp: 18   Filed Weights   03/06/15 0815  Weight: 177 lb 6 oz (80.457 kg)    GENERAL:alert, no distress and comfortable SKIN: skin color, texture, turgor are normal, no rashes or significant lesions EYES: normal, Conjunctiva are pink and non-injected, sclera clear OROPHARYNX:no exudate, no erythema and lips, buccal mucosa, and tongue normal  NECK: supple, thyroid normal size, non-tender, without nodularity LYMPH:  no palpable lymphadenopathy in the cervical, axillary or inguinal LUNGS: clear to auscultation and percussion with normal breathing effort HEART: regular rate & rhythm and no murmurs and no lower extremity edema ABDOMEN:abdomen soft, non-tender and normal bowel sounds Musculoskeletal:no cyanosis of digits and no clubbing  NEURO: alert & oriented x 3 with fluent speech, no focal motor/sensory deficits  LABORATORY DATA:  I have reviewed the data as listed   Chemistry      Component Value Date/Time   NA 141 02/27/2015 1458   NA 139 02/14/2015 1547   K 3.6 02/27/2015 1458   K 3.5 02/14/2015 1547   CL 104 02/14/2015 1547   CO2 28 02/27/2015 1458   CO2 24 02/14/2015 1547   BUN 7.2 02/27/2015 1458   BUN 5* 02/14/2015 1547   CREATININE 0.7 02/27/2015 1458   CREATININE 0.65 02/14/2015 1547      Component Value Date/Time   CALCIUM  9.5 02/27/2015 1458   CALCIUM 9.3 02/14/2015 1547   ALKPHOS 87 02/27/2015 1458   ALKPHOS 61 02/14/2015 1547   AST 11 02/27/2015 1458   AST 15 02/14/2015 1547   ALT 11 02/27/2015 1458   ALT 12* 02/14/2015 1547   BILITOT <0.20 02/27/2015 1458   BILITOT 0.7 02/14/2015 1547       Lab Results  Component Value Date   WBC 8.4 03/06/2015   HGB 10.7* 03/06/2015   HCT 31.7* 03/06/2015   MCV 81.1 03/06/2015   PLT 163 03/06/2015   NEUTROABS 6.3 03/06/2015    ASSESSMENT & PLAN:  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 Patient enrolled in PREVENT clinical trial: Lipitor vs Placebo  Current treatment: Dose dense Adriamycin and Cytoxan cycle 2 arrival Chemo toxicities: 1. Constipation 2. Heartburn 3. Grade 1 nausea related to constipation 4. Grade 1 anemia: With a history of sickle cell trait. We will monitor this  Blood counts reviewed and there is no evidence of neutropenia.  RTC in 2 week for cycle 3  No orders of the defined types were placed in this encounter.   The patient has a good understanding of the overall plan. she agrees with it. she will call with any problems that may develop before the next visit here.   Rulon Eisenmenger, MD $RemoveBeforeD'@DATE'[id number]$ @

## 2015-03-07 ENCOUNTER — Encounter: Payer: Self-pay | Admitting: Genetic Counselor

## 2015-03-07 DIAGNOSIS — Z1379 Encounter for other screening for genetic and chromosomal anomalies: Secondary | ICD-10-CM

## 2015-03-07 NOTE — Progress Notes (Signed)
GENETIC TEST RESULTS  Patient Name: Phyllis Pruitt Patient Age: 44 y.o. Encounter Date: 03/07/2015  Referring Physician: Nicholas Lose, MD   Phyllis Pruitt was called today to discuss genetic test results. Please see the Genetics note from her visit on 02/09/15 for a detailed discussion of her personal and family history.  GENETIC TESTING: At the time of Phyllis Pruitt visit, we recommended she pursue genetic testing of multiple genes on the BreastNext gene panel. This test, which included sequencing and deletion/duplication analysis of 17 genes, was performed at [facility]. Testing did not reveal any clearly pathogenic mutation in these genes. The genes tested were ATM, BARD1, BRCA1, BRCA2, BRIP1, CDH1, CHEK2, MRE11A, MUTYH, NBN, NF1, PALB2, PTEN, RAD50, RAD51C, RAD51D, and TP53.  We discussed with Phyllis Pruitt that since the current test is not perfect, it is possible there may be a gene mutation that current testing cannot detect, but that chance is small. We also discussed that it is possible that a different genetic factor, which was not part of this testing or has not yet been discovered, is responsible for the cancer diagnoses in the family. Should Phyllis Pruitt wish to discuss or pursue this additional testing, we are happy to coordinate this at any time, but do not feel that she is at significant risk of harboring a mutation in a different gene.     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 At this time, it is unknown if this variant is associated with increased cancer risk or if this is a normal finding, but most variants such as this get reclassified to being inconsequential. It should not be used to make medical management decisions. With time, we suspect the lab will determine the significance of this variant, if any. If we do learn more about it, we will try to contact Phyllis Pruitt to discuss it further. However,  it is important to stay in touch with [place] periodically and keep the address and phone number up to date.  CANCER SCREENING: This result suggests that Phyllis Pruitt cancer was most likely not due to an inherited predisposition. Most cancers happen by chance and this test, along with details of her family history, suggests that her cancer falls into this category. We, therefore, recommended she continue to follow the cancer screening guidelines provided by her physician.   FAMILY MEMBERS: Women in the family are at some increased risk of developing breast cancer, over the general population risk, simply due to the family history. We recommended they have a yearly mammogram beginning at age 71, which is 49 years earlier than the youngest breast cancer, a yearly clinical breast exam, and perform monthly breast self-exams. A gynecologic exam is recommended yearly. Colon cancer screening is recommended for men and women to begin by age 25.  Family members should not pursue testing for the above VUS outside of a research setting as it has no implications for their medical management.  Lastly, we discussed with Phyllis Pruitt that cancer genetics is a rapidly advancing field and it is possible that new genetic tests will be appropriate for her in the future. We encouraged her to remain in contact with [place] on an annual basis so we can update her personal and family histories, and let her know of advances in cancer genetics that may benefit the family. Our contact number was provided. Phyllis Pruitt questions were answered to her satisfaction today, and she knows she is welcome to call  anytime with additional questions.    Steele Berg, MS, [employer] Certified Genetic Counselor phone: [phone] [email or web address]

## 2015-03-14 ENCOUNTER — Other Ambulatory Visit (HOSPITAL_COMMUNITY): Payer: Self-pay | Admitting: Urology

## 2015-03-14 DIAGNOSIS — N2889 Other specified disorders of kidney and ureter: Secondary | ICD-10-CM

## 2015-03-20 ENCOUNTER — Telehealth: Payer: Self-pay | Admitting: Hematology and Oncology

## 2015-03-20 ENCOUNTER — Other Ambulatory Visit (HOSPITAL_BASED_OUTPATIENT_CLINIC_OR_DEPARTMENT_OTHER): Payer: 59

## 2015-03-20 ENCOUNTER — Ambulatory Visit (HOSPITAL_BASED_OUTPATIENT_CLINIC_OR_DEPARTMENT_OTHER): Payer: 59

## 2015-03-20 ENCOUNTER — Telehealth: Payer: Self-pay | Admitting: Oncology

## 2015-03-20 ENCOUNTER — Ambulatory Visit (HOSPITAL_BASED_OUTPATIENT_CLINIC_OR_DEPARTMENT_OTHER): Payer: 59 | Admitting: Hematology and Oncology

## 2015-03-20 VITALS — BP 129/79 | HR 72 | Temp 98.2°F | Resp 18 | Ht 65.5 in | Wt 177.4 lb

## 2015-03-20 DIAGNOSIS — Z5111 Encounter for antineoplastic chemotherapy: Secondary | ICD-10-CM

## 2015-03-20 DIAGNOSIS — Z5189 Encounter for other specified aftercare: Secondary | ICD-10-CM

## 2015-03-20 DIAGNOSIS — C50411 Malignant neoplasm of upper-outer quadrant of right female breast: Secondary | ICD-10-CM

## 2015-03-20 DIAGNOSIS — L089 Local infection of the skin and subcutaneous tissue, unspecified: Secondary | ICD-10-CM

## 2015-03-20 DIAGNOSIS — Z006 Encounter for examination for normal comparison and control in clinical research program: Secondary | ICD-10-CM

## 2015-03-20 DIAGNOSIS — Z171 Estrogen receptor negative status [ER-]: Secondary | ICD-10-CM | POA: Diagnosis not present

## 2015-03-20 LAB — COMPREHENSIVE METABOLIC PANEL (CC13)
ALBUMIN: 3.3 g/dL — AB (ref 3.5–5.0)
ALT: 13 U/L (ref 0–55)
ANION GAP: 10 meq/L (ref 3–11)
AST: 14 U/L (ref 5–34)
Alkaline Phosphatase: 79 U/L (ref 40–150)
BILIRUBIN TOTAL: 0.23 mg/dL (ref 0.20–1.20)
BUN: 8.9 mg/dL (ref 7.0–26.0)
CO2: 24 mEq/L (ref 22–29)
CREATININE: 0.7 mg/dL (ref 0.6–1.1)
Calcium: 8.8 mg/dL (ref 8.4–10.4)
Chloride: 107 mEq/L (ref 98–109)
EGFR: >90 mL/min/{1.73_m2} (ref >90)
GLUCOSE: 98 mg/dL (ref 70–140)
POTASSIUM: 3.7 meq/L (ref 3.5–5.1)
SODIUM: 140 meq/L (ref 136–145)
TOTAL PROTEIN: 6.7 g/dL (ref 6.4–8.3)

## 2015-03-20 LAB — CBC WITH DIFFERENTIAL/PLATELET
BASO%: 0.7 % (ref 0.0–2.0)
BASOS ABS: 0.1 10*3/uL (ref 0.0–0.1)
EOS ABS: 0 10*3/uL (ref 0.0–0.5)
EOS%: 0 % (ref 0.0–7.0)
HEMATOCRIT: 31.1 % — AB (ref 34.8–46.6)
HEMOGLOBIN: 10.4 g/dL — AB (ref 11.6–15.9)
LYMPH%: 13.4 % — AB (ref 14.0–49.7)
MCH: 27.7 pg (ref 25.1–34.0)
MCHC: 33.4 g/dL (ref 31.5–36.0)
MCV: 82.7 fL (ref 79.5–101.0)
MONO#: 0.8 10*3/uL (ref 0.1–0.9)
MONO%: 11.1 % (ref 0.0–14.0)
NEUT#: 5.6 10*3/uL (ref 1.5–6.5)
NEUT%: 74.8 % (ref 38.4–76.8)
PLATELETS: 201 10*3/uL (ref 145–400)
RBC: 3.76 10*6/uL (ref 3.70–5.45)
RDW: 17.5 % — ABNORMAL HIGH (ref 11.2–14.5)
WBC: 7.5 10*3/uL (ref 3.9–10.3)
lymph#: 1 10*3/uL (ref 0.9–3.3)

## 2015-03-20 MED ORDER — DOXORUBICIN HCL CHEMO IV INJECTION 2 MG/ML
60.0000 mg/m2 | Freq: Once | INTRAVENOUS | Status: AC
  Administered 2015-03-20: 116 mg via INTRAVENOUS
  Filled 2015-03-20: qty 58

## 2015-03-20 MED ORDER — CEPHALEXIN 500 MG PO CAPS: 500.0000 mg | ORAL_CAPSULE | Freq: Two times a day (BID) | ORAL | Status: DC

## 2015-03-20 MED ORDER — HEPARIN SOD (PORK) LOCK FLUSH 100 UNIT/ML IV SOLN
500.0000 [IU] | Freq: Once | INTRAVENOUS | Status: AC | PRN
  Administered 2015-03-20: 500 [IU]
  Filled 2015-03-20: qty 5

## 2015-03-20 MED ORDER — SODIUM CHLORIDE 0.9 % IV SOLN
Freq: Once | INTRAVENOUS | Status: AC
  Administered 2015-03-20: 10:00:00 via INTRAVENOUS

## 2015-03-20 MED ORDER — SODIUM CHLORIDE 0.9 % IV SOLN
Freq: Once | INTRAVENOUS | Status: AC
  Administered 2015-03-20: 10:00:00 via INTRAVENOUS
  Filled 2015-03-20: qty 5

## 2015-03-20 MED ORDER — PEGFILGRASTIM 6 MG/0.6ML ~~LOC~~ PSKT
6.0000 mg | PREFILLED_SYRINGE | Freq: Once | SUBCUTANEOUS | Status: AC
  Administered 2015-03-20: 6 mg via SUBCUTANEOUS
  Filled 2015-03-20: qty 0.6

## 2015-03-20 MED ORDER — SODIUM CHLORIDE 0.9 % IJ SOLN
10.0000 mL | INTRAMUSCULAR | Status: DC | PRN
  Administered 2015-03-20: 10 mL
  Filled 2015-03-20: qty 10

## 2015-03-20 MED ORDER — FLUCONAZOLE 100 MG PO TABS: 100.0000 mg | ORAL_TABLET | Freq: Every day | ORAL | Status: DC

## 2015-03-20 MED ORDER — PALONOSETRON HCL INJECTION 0.25 MG/5ML
INTRAVENOUS | Status: AC
  Filled 2015-03-20: qty 5

## 2015-03-20 MED ORDER — PALONOSETRON HCL INJECTION 0.25 MG/5ML
0.2500 mg | Freq: Once | INTRAVENOUS | Status: AC
  Administered 2015-03-20: 0.25 mg via INTRAVENOUS

## 2015-03-20 MED ORDER — SODIUM CHLORIDE 0.9 % IV SOLN
600.0000 mg/m2 | Freq: Once | INTRAVENOUS | Status: AC
  Administered 2015-03-20: 1160 mg via INTRAVENOUS
  Filled 2015-03-20: qty 58

## 2015-03-20 NOTE — Assessment & Plan Note (Signed)
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 Patient enrolled in PREVENT clinical trial: Lipitor vs Placebo  Current treatment: Dose dense Adriamycin and Cytoxan cycle 3 Chemo toxicities: 1. Constipation 2. Heartburn 3. Grade 1 nausea related to constipation 4. Grade 1 anemia: With a history of sickle cell trait. We will monitor this  Blood counts reviewed and there is no evidence of neutropenia.  RTC in 2 week for cycle 4 

## 2015-03-20 NOTE — Progress Notes (Signed)
Patient Care Team: [name], MD as PCP - General (Family Medicine) Chauncey Cruel, MD as Consulting Physician (Oncology) Thea Silversmith, MD as Consulting Physician (Radiation Oncology) Mauro Kaufmann, RN as Registered Nurse Rockwell Germany, RN as Registered Nurse Alphonsa Overall, MD as Consulting Physician (General Surgery)  DIAGNOSIS: Breast cancer of upper-outer quadrant of right female breast   Staging form: Breast, AJCC 7th Edition     Clinical stage from 02/08/2015: Stage IIIA (T3, N1, M0) - Unsigned   SUMMARY OF ONCOLOGIC HISTORY:   Breast cancer of upper-outer quadrant of right female breast   01/27/2015 Initial Diagnosis Right breast 9:00 and 10:00 biopsy: Grade 3 IDC ER/PR negative, HER-2 negative, Ki-67 77% and 73%; the right axillary lymph node biopsy: EF 22% positive   01/30/2015 Breast MRI Right breast masses 9 and 10:00 position, additional irregular masses with non-mass enhancement upper outer quadrant with one mass extending into upper inner quadrant, multifocal, multicentric 13.7 x 4.6 x 4.6 cm, malignant right axillary lymph node   02/15/2015 Imaging No evidence of metastatic disease, 2.2 cm enhancing lesion anterior left lower kidney suspicious for solid renal neoplasm   02/20/2015 -  Neo-Adjuvant Chemotherapy Adriamycin and cytoxan dose dense Q 2 weeks X 4 foll by Abraxane weekly x 12    CHIEF COMPLIANT: Right breast lump is getting smaller, today is cycle 3 day 1 of Adriamycin and Cytoxan  INTERVAL HISTORY: Phyllis Pruitt is a 44 year old with above-mentioned history of right-sided breast cancer currently neo-adjuvant chemotherapy and today is cycle 3 day 1 of Adriamycin and Cytoxan. After last cycle she felt the stomach was strange sensation. She also had a groin infection which led to a pustule that erupted in the pubic area. She took over-the-counter anti-fungal medicine and it seems to help somewhat. She continues to have mild discomfort there.  REVIEW OF  SYSTEMS:   Constitutional: Denies fevers, chills or abnormal weight loss Eyes: Denies blurriness of vision Ears, nose, mouth, throat, and face: Denies mucositis or sore throat Respiratory: Denies cough, dyspnea or wheezes Cardiovascular: Denies palpitation, chest discomfort or lower extremity swelling Gastrointestinal:  Denies nausea, heartburn or change in bowel habits Skin: Denies abnormal skin rashes Lymphatics: Denies new lymphadenopathy or easy bruising Neurological:Denies numbness, tingling or new weaknesses Behavioral/Psych: Mood is stable, no new changes  Breast: Feels that the breast lump is smaller All other systems were reviewed with the patient and are negative.  I have reviewed the past medical history, past surgical history, social history and family history with the patient and they are unchanged from previous note.  ALLERGIES:  has No Known Allergies.  MEDICATIONS:  Current Outpatient Prescriptions  Medication Sig Dispense Refill  . Atorvastatin Calcium (INVESTIGATIONAL ATORVASTATIN/PLACEBO) 40 MG tablet [facility] [address] Take 1 tablet by mouth daily. Take 2 doses (these doses must be 12 hours apart) prior to first chemotherapy treatment. Then take 1 tablet daily by mouth with or without food. 180 tablet 0  . dexamethasone (DECADRON) 4 MG tablet Take 1 tablets by mouth once a day on the day after chemotherapy and then take 1 tablets two times a day for 2 days. Take with food. 30 tablet 1  . diphenhydramine-acetaminophen (TYLENOL PM) 25-500 MG TABS Take 1 tablet by mouth at bedtime as needed (sleep).     Marland Kitchen ECHINACEA PO Take 1 tablet by mouth daily.     . Ginger, Zingiber officinalis, (GINGER ROOT PO) Take 1 tablet by mouth daily.     Marland Kitchen HYDROcodone-acetaminophen (  NORCO/VICODIN) 5-325 MG per tablet     . lidocaine-prilocaine (EMLA) cream Apply to affected area once 30 g 3  . LORazepam (ATIVAN) 0.5 MG tablet Take 1 tablet (0.5 mg total) by mouth at bedtime. 30 tablet 0  .  Multiple Vitamin (MULTIVITAMIN WITH MINERALS) TABS tablet Take 1 tablet by mouth daily.    . ondansetron (ZOFRAN) 8 MG tablet Take 1 tablet (8 mg total) by mouth 2 (two) times daily as needed. Start on the third day after chemotherapy. 30 tablet 1  . penicillin v potassium (VEETID) 500 MG tablet     . prochlorperazine (COMPAZINE) 10 MG tablet Take 1 tablet (10 mg total) by mouth every 6 (six) hours as needed (Nausea or vomiting). 30 tablet 1  . TURMERIC CURCUMIN PO Take 1 capsule by mouth daily.      No current facility-administered medications for this visit.    PHYSICAL EXAMINATION: ECOG PERFORMANCE STATUS: 1 - Symptomatic but completely ambulatory  Filed Vitals:   03/20/15 0859  BP: 129/79  Pulse: 72  Temp: 98.2 F (36.8 C)  Resp: 18   Filed Weights   03/20/15 0859  Weight: 177 lb 6.4 oz (80.468 kg)    GENERAL:alert, no distress and comfortable SKIN: skin color, texture, turgor are normal, no rashes or significant lesions EYES: normal, Conjunctiva are pink and non-injected, sclera clear OROPHARYNX:no exudate, no erythema and lips, buccal mucosa, and tongue normal  NECK: supple, thyroid normal size, non-tender, without nodularity LYMPH:  no palpable lymphadenopathy in the cervical, axillary or inguinal LUNGS: clear to auscultation and percussion with normal breathing effort HEART: regular rate & rhythm and no murmurs and no lower extremity edema ABDOMEN:abdomen soft, non-tender and normal bowel sounds Musculoskeletal:no cyanosis of digits and no clubbing  NEURO: alert & oriented x 3 with fluent speech, no focal motor/sensory deficits  LABORATORY DATA:  I have reviewed the data as listed   Chemistry      Component Value Date/Time   NA 140 03/20/2015 0845   NA 139 02/14/2015 1547   K 3.7 03/20/2015 0845   K 3.5 02/14/2015 1547   CL 104 02/14/2015 1547   CO2 24 03/20/2015 0845   CO2 24 02/14/2015 1547   BUN 8.9 03/20/2015 0845   BUN 5* 02/14/2015 1547   CREATININE  0.7 03/20/2015 0845   CREATININE 0.65 02/14/2015 1547      Component Value Date/Time   CALCIUM 8.8 03/20/2015 0845   CALCIUM 9.3 02/14/2015 1547   ALKPHOS 79 03/20/2015 0845   ALKPHOS 61 02/14/2015 1547   AST 14 03/20/2015 0845   AST 15 02/14/2015 1547   ALT 13 03/20/2015 0845   ALT 12* 02/14/2015 1547   BILITOT 0.23 03/20/2015 0845   BILITOT 0.7 02/14/2015 1547       Lab Results  Component Value Date   WBC 7.5 03/20/2015   HGB 10.4* 03/20/2015   HCT 31.1* 03/20/2015   MCV 82.7 03/20/2015   PLT 201 03/20/2015   NEUTROABS 5.6 03/20/2015     RADIOGRAPHIC STUDIES: I have personally reviewed the radiology reports and agreed with their findings. No results found.   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 Patient enrolled in PREVENT clinical trial: Lipitor vs Placebo  Current treatment: Neo-adjuvant chemotherapy Dose dense Adriamycin and Cytoxan cycle 3 Chemo toxicities: 1. Constipation 2. Heartburn 3. Grade 1 nausea related to constipation 4. Grade 1 anemia: With a history of sickle cell trait. We will monitor this 5. Left groin infection with pus to that burst open after cycle 2 of AC: I will prescribe her Diflucan for 7 days with Keflex for 7 days. 6. Alopecia  Blood counts reviewed and they are adequate for treatment  RTC in 2 week for cycle 4 with NP    No orders of the defined types were placed in this encounter.   The patient  has a good understanding of the overall plan. she agrees with it. she will call with any problems that may develop before the next visit here.   Rulon Eisenmenger, MD

## 2015-03-20 NOTE — Telephone Encounter (Signed)
Patient called in to reschedule her appointment as her stomach is not well  anne

## 2015-03-20 NOTE — Patient Instructions (Signed)
[facility] Discharge Instructions for Patients Receiving Chemotherapy  Today you received the following chemotherapy agents:Adriamycin and Cytoxan   To help prevent nausea and vomiting after your treatment, we encourage you to take your nausea medication as directed.    If you develop nausea and vomiting that is not controlled by your nausea medication, call the clinic.   BELOW ARE SYMPTOMS THAT SHOULD BE REPORTED IMMEDIATELY:  *FEVER GREATER THAN 100.5 F  *CHILLS WITH OR WITHOUT FEVER  NAUSEA AND VOMITING THAT IS NOT CONTROLLED WITH YOUR NAUSEA MEDICATION  *UNUSUAL SHORTNESS OF BREATH  *UNUSUAL BRUISING OR BLEEDING  TENDERNESS IN MOUTH AND THROAT WITH OR WITHOUT PRESENCE OF ULCERS  *URINARY PROBLEMS  *BOWEL PROBLEMS  UNUSUAL RASH Items with * indicate a potential emergency and should be followed up as soon as possible.  Feel free to call the clinic you have any questions or concerns. The clinic phone number is [phone].  Please show the CHEMO ALERT CARD at check-in to the Emergency Department and triage nurse.   

## 2015-03-20 NOTE — Telephone Encounter (Signed)
6/20 appointments adjusted and patient will get an avs in chemo

## 2015-03-21 ENCOUNTER — Ambulatory Visit (HOSPITAL_COMMUNITY): Payer: 59

## 2015-03-23 ENCOUNTER — Ambulatory Visit (HOSPITAL_COMMUNITY): Payer: 59

## 2015-03-28 ENCOUNTER — Ambulatory Visit: Payer: Self-pay

## 2015-04-03 ENCOUNTER — Ambulatory Visit: Payer: 59

## 2015-04-03 ENCOUNTER — Encounter: Payer: Self-pay | Admitting: Oncology

## 2015-04-03 ENCOUNTER — Ambulatory Visit (HOSPITAL_BASED_OUTPATIENT_CLINIC_OR_DEPARTMENT_OTHER): Payer: 59

## 2015-04-03 ENCOUNTER — Other Ambulatory Visit: Payer: 59

## 2015-04-03 ENCOUNTER — Other Ambulatory Visit (HOSPITAL_BASED_OUTPATIENT_CLINIC_OR_DEPARTMENT_OTHER): Payer: 59

## 2015-04-03 ENCOUNTER — Encounter: Payer: Self-pay | Admitting: *Deleted

## 2015-04-03 ENCOUNTER — Ambulatory Visit (HOSPITAL_BASED_OUTPATIENT_CLINIC_OR_DEPARTMENT_OTHER): Payer: 59 | Admitting: Oncology

## 2015-04-03 ENCOUNTER — Encounter: Payer: Self-pay | Admitting: Medical Oncology

## 2015-04-03 VITALS — BP 131/88 | HR 74 | Temp 98.8°F | Resp 18 | Ht 65.5 in | Wt 182.0 lb

## 2015-04-03 DIAGNOSIS — C50811 Malignant neoplasm of overlapping sites of right female breast: Secondary | ICD-10-CM

## 2015-04-03 DIAGNOSIS — C773 Secondary and unspecified malignant neoplasm of axilla and upper limb lymph nodes: Secondary | ICD-10-CM

## 2015-04-03 DIAGNOSIS — N289 Disorder of kidney and ureter, unspecified: Secondary | ICD-10-CM

## 2015-04-03 DIAGNOSIS — Z5189 Encounter for other specified aftercare: Secondary | ICD-10-CM

## 2015-04-03 DIAGNOSIS — Z006 Encounter for examination for normal comparison and control in clinical research program: Secondary | ICD-10-CM | POA: Diagnosis not present

## 2015-04-03 DIAGNOSIS — Z5111 Encounter for antineoplastic chemotherapy: Secondary | ICD-10-CM | POA: Diagnosis not present

## 2015-04-03 DIAGNOSIS — C50411 Malignant neoplasm of upper-outer quadrant of right female breast: Secondary | ICD-10-CM

## 2015-04-03 DIAGNOSIS — Z171 Estrogen receptor negative status [ER-]: Secondary | ICD-10-CM | POA: Diagnosis not present

## 2015-04-03 LAB — CBC WITH DIFFERENTIAL/PLATELET
BASO%: 0.5 % (ref 0.0–2.0)
Basophils Absolute: 0.1 10*3/uL (ref 0.0–0.1)
EOS%: 0.1 % (ref 0.0–7.0)
Eosinophils Absolute: 0 10*3/uL (ref 0.0–0.5)
HCT: 32 % — ABNORMAL LOW (ref 34.8–46.6)
HGB: 10.6 g/dL — ABNORMAL LOW (ref 11.6–15.9)
LYMPH%: 8.7 % — AB (ref 14.0–49.7)
MCH: 27.9 pg (ref 25.1–34.0)
MCHC: 33.2 g/dL (ref 31.5–36.0)
MCV: 84.1 fL (ref 79.5–101.0)
MONO#: 1.1 10*3/uL — ABNORMAL HIGH (ref 0.1–0.9)
MONO%: 10.2 % (ref 0.0–14.0)
NEUT%: 80.5 % — ABNORMAL HIGH (ref 38.4–76.8)
NEUTROS ABS: 8.9 10*3/uL — AB (ref 1.5–6.5)
Platelets: 197 10*3/uL (ref 145–400)
RBC: 3.8 10*6/uL (ref 3.70–5.45)
RDW: 20 % — ABNORMAL HIGH (ref 11.2–14.5)
WBC: 11.1 10*3/uL — AB (ref 3.9–10.3)
lymph#: 1 10*3/uL (ref 0.9–3.3)

## 2015-04-03 LAB — COMPREHENSIVE METABOLIC PANEL (CC13)
ALK PHOS: 86 U/L (ref 40–150)
ALT: 17 U/L (ref 0–55)
ANION GAP: 6 meq/L (ref 3–11)
AST: 14 U/L (ref 5–34)
Albumin: 3.4 g/dL — ABNORMAL LOW (ref 3.5–5.0)
BILIRUBIN TOTAL: 0.21 mg/dL (ref 0.20–1.20)
BUN: 7.7 mg/dL (ref 7.0–26.0)
CO2: 29 meq/L (ref 22–29)
CREATININE: 0.7 mg/dL (ref 0.6–1.1)
Calcium: 8.7 mg/dL (ref 8.4–10.4)
Chloride: 106 mEq/L (ref 98–109)
EGFR: >90 mL/min/{1.73_m2} (ref >90)
Glucose: 106 mg/dl (ref 70–140)
Potassium: 3.6 mEq/L (ref 3.5–5.1)
SODIUM: 142 meq/L (ref 136–145)
Total Protein: 6.7 g/dL (ref 6.4–8.3)

## 2015-04-03 MED ORDER — PEGFILGRASTIM 6 MG/0.6ML ~~LOC~~ PSKT
6.0000 mg | PREFILLED_SYRINGE | Freq: Once | SUBCUTANEOUS | Status: AC
  Administered 2015-04-03: 6 mg via SUBCUTANEOUS
  Filled 2015-04-03: qty 0.6

## 2015-04-03 MED ORDER — PALONOSETRON HCL INJECTION 0.25 MG/5ML
0.2500 mg | Freq: Once | INTRAVENOUS | Status: AC
  Administered 2015-04-03: 0.25 mg via INTRAVENOUS

## 2015-04-03 MED ORDER — SODIUM CHLORIDE 0.9 % IV SOLN
Freq: Once | INTRAVENOUS | Status: AC
  Administered 2015-04-03: 10:00:00 via INTRAVENOUS

## 2015-04-03 MED ORDER — PALONOSETRON HCL INJECTION 0.25 MG/5ML
INTRAVENOUS | Status: AC
  Filled 2015-04-03: qty 5

## 2015-04-03 MED ORDER — SODIUM CHLORIDE 0.9 % IJ SOLN
10.0000 mL | INTRAMUSCULAR | Status: DC | PRN
  Administered 2015-04-03: 10 mL
  Filled 2015-04-03: qty 10

## 2015-04-03 MED ORDER — SODIUM CHLORIDE 0.9 % IV SOLN
600.0000 mg/m2 | Freq: Once | INTRAVENOUS | Status: AC
  Administered 2015-04-03: 1160 mg via INTRAVENOUS
  Filled 2015-04-03: qty 58

## 2015-04-03 MED ORDER — HEPARIN SOD (PORK) LOCK FLUSH 100 UNIT/ML IV SOLN
500.0000 [IU] | Freq: Once | INTRAVENOUS | Status: AC | PRN
  Administered 2015-04-03: 500 [IU]
  Filled 2015-04-03: qty 5

## 2015-04-03 MED ORDER — DOXORUBICIN HCL CHEMO IV INJECTION 2 MG/ML
60.0000 mg/m2 | Freq: Once | INTRAVENOUS | Status: AC
  Administered 2015-04-03: 116 mg via INTRAVENOUS
  Filled 2015-04-03: qty 58

## 2015-04-03 MED ORDER — SODIUM CHLORIDE 0.9 % IV SOLN
Freq: Once | INTRAVENOUS | Status: AC
  Administered 2015-04-03: 10:00:00 via INTRAVENOUS
  Filled 2015-04-03: qty 5

## 2015-04-03 NOTE — Assessment & Plan Note (Signed)
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 Patient enrolled in PREVENT clinical trial: Lipitor vs Placebo  Current treatment: Dose dense Adriamycin and Cytoxan cycle 4  Chemo toxicities: 1. Constipation 2. Heartburn 3. Grade 1 nausea related to constipation 4. Grade 1 anemia: With a history of sickle cell trait. We will monitor this  Blood counts reviewed and there is no evidence of neutropenia. Patient is refusing Dexamethasone today. Aware that she might have more nausea and is willing to take this risk. Will still get Aloxi and Emend with treatment today.  RTC in 2 weeks to begin weekly Abraxane X 12 weeks.

## 2015-04-03 NOTE — Progress Notes (Signed)
PREVENT Patient here today for labs, office visit and cycle 4 of anthracycline chemo treatment. Met patient in treatment room to discuss how she is feeling and any issues or concerns she may have regarding study drug. Patient states she is doing well and without complaints. Confirms that she is not having any difficulty taking her study drug/placebo nightly. Denies any questions or concerns. Patient's two months of being on study approaching July 9th. I requested for patient, at her next scheduled treatment appointment, to bring her 2 months on study calendars for me. Patient confirmed that she would. I encouraged patient to call me or Dr. [name] with any questions/concerns. Thanked her for her time and participation in study. Adele Dan, RN, Clinical Research 04/03/2015 10:16 AM

## 2015-04-03 NOTE — Progress Notes (Signed)
Patient Care Team: [name], MD as PCP - General (Family Medicine) Chauncey Cruel, MD as Consulting Physician (Oncology) Thea Silversmith, MD as Consulting Physician (Radiation Oncology) Mauro Kaufmann, RN as Registered Nurse Rockwell Germany, RN as Registered Nurse Alphonsa Overall, MD as Consulting Physician (General Surgery)  DIAGNOSIS: Breast cancer of upper-outer quadrant of right female breast   Staging form: Breast, AJCC 7th Edition     Clinical stage from 02/08/2015: Stage IIIA (T3, N1, M0) - Unsigned   SUMMARY OF ONCOLOGIC HISTORY:   Breast cancer of upper-outer quadrant of right female breast   01/27/2015 Initial Diagnosis Right breast 9:00 and 10:00 biopsy: Grade 3 IDC ER/PR negative, HER-2 negative, Ki-67 77% and 73%; the right axillary lymph node biopsy: EF 22% positive   01/30/2015 Breast MRI Right breast masses 9 and 10:00 position, additional irregular masses with non-mass enhancement upper outer quadrant with one mass extending into upper inner quadrant, multifocal, multicentric 13.7 x 4.6 x 4.6 cm, malignant right axillary lymph node   02/15/2015 Imaging No evidence of metastatic disease, 2.2 cm enhancing lesion anterior left lower kidney suspicious for solid renal neoplasm   02/20/2015 -  Neo-Adjuvant Chemotherapy Adriamycin and cytoxan dose dense Q 2 weeks X 4 foll by Abraxane weekly x 12    CHIEF COMPLIANT: Right breast lump is getting smaller, today is cycle 4 day 1 of Adriamycin and Cytoxan  INTERVAL HISTORY: Phyllis Pruitt is a 44 year old with above-mentioned history of right-sided breast cancer currently neo-adjuvant chemotherapy and today is cycle 4 day 1 of Adriamycin and Cytoxan. After last cycle she felt the stomach was strange sensation. Thinks this is due to the Dexamethasone. Does not want to take this today with chemo. States that she would rather vomit that have this sensation in her stomach.  REVIEW OF SYSTEMS:   Constitutional: Denies fevers, chills or  abnormal weight loss Eyes: Denies blurriness of vision Ears, nose, mouth, throat, and face: Denies mucositis or sore throat Respiratory: Denies cough, dyspnea or wheezes Cardiovascular: Denies palpitation, chest discomfort or lower extremity swelling Gastrointestinal:  Denies nausea, heartburn or change in bowel habits Skin: Denies abnormal skin rashes Lymphatics: Denies new lymphadenopathy or easy bruising Neurological:Denies numbness, tingling or new weaknesses Behavioral/Psych: Mood is stable, no new changes  Breast: Feels that the breast lump is smaller All other systems were reviewed with the patient and are negative.  I have reviewed the past medical history, past surgical history, social history and family history with the patient and they are unchanged from previous note.  ALLERGIES:  has No Known Allergies.  MEDICATIONS:  Current Outpatient Prescriptions  Medication Sig Dispense Refill  . Atorvastatin Calcium (INVESTIGATIONAL ATORVASTATIN/PLACEBO) 40 MG tablet [facility] [id number] Take 1 tablet by mouth daily. Take 2 doses (these doses must be 12 hours apart) prior to first chemotherapy treatment. Then take 1 tablet daily by mouth with or without food. 180 tablet 0  . cephALEXin (KEFLEX) 500 MG capsule Take 1 capsule (500 mg total) by mouth 2 (two) times daily. 14 capsule 0  . dexamethasone (DECADRON) 4 MG tablet Take 1 tablets by mouth once a day on the day after chemotherapy and then take 1 tablets two times a day for 2 days. Take with food. 30 tablet 1  . diphenhydramine-acetaminophen (TYLENOL PM) 25-500 MG TABS Take 1 tablet by mouth at bedtime as needed (sleep).     Marland Kitchen ECHINACEA PO Take 1 tablet by mouth daily.     . fluconazole (DIFLUCAN)  100 MG tablet Take 1 tablet (100 mg total) by mouth daily. 7 tablet 0  . Ginger, Zingiber officinalis, (GINGER ROOT PO) Take 1 tablet by mouth daily.     Marland Kitchen HYDROcodone-acetaminophen (NORCO/VICODIN) 5-325 MG per tablet     .  lidocaine-prilocaine (EMLA) cream Apply to affected area once 30 g 3  . LORazepam (ATIVAN) 0.5 MG tablet Take 1 tablet (0.5 mg total) by mouth at bedtime. 30 tablet 0  . Multiple Vitamin (MULTIVITAMIN WITH MINERALS) TABS tablet Take 1 tablet by mouth daily.    . ondansetron (ZOFRAN) 8 MG tablet Take 1 tablet (8 mg total) by mouth 2 (two) times daily as needed. Start on the third day after chemotherapy. 30 tablet 1  . penicillin v potassium (VEETID) 500 MG tablet     . prochlorperazine (COMPAZINE) 10 MG tablet Take 1 tablet (10 mg total) by mouth every 6 (six) hours as needed (Nausea or vomiting). 30 tablet 1  . TURMERIC CURCUMIN PO Take 1 capsule by mouth daily.      No current facility-administered medications for this visit.    PHYSICAL EXAMINATION: ECOG PERFORMANCE STATUS: 1 - Symptomatic but completely ambulatory  Filed Vitals:   04/03/15 0831  BP: 131/88  Pulse: 74  Temp: 98.8 F (37.1 C)  Resp: 18   Filed Weights   04/03/15 0831  Weight: 182 lb (82.555 kg)    GENERAL:alert, no distress and comfortable SKIN: skin color, texture, turgor are normal, no rashes or significant lesions EYES: normal, Conjunctiva are pink and non-injected, sclera clear OROPHARYNX:no exudate, no erythema and lips, buccal mucosa, and tongue normal  NECK: supple, thyroid normal size, non-tender, without nodularity LYMPH:  no palpable lymphadenopathy in the cervical, axillary or inguinal LUNGS: clear to auscultation and percussion with normal breathing effort HEART: regular rate & rhythm and no murmurs and no lower extremity edema ABDOMEN:abdomen soft, non-tender and normal bowel sounds Musculoskeletal:no cyanosis of digits and no clubbing  NEURO: alert & oriented x 3 with fluent speech, no focal motor/sensory deficits  LABORATORY DATA:  I have reviewed the data as listed   Chemistry      Component Value Date/Time   NA 142 04/03/2015 0817   NA 139 02/14/2015 1547   K 3.6 04/03/2015 0817   K  3.5 02/14/2015 1547   CL 104 02/14/2015 1547   CO2 29 04/03/2015 0817   CO2 24 02/14/2015 1547   BUN 7.7 04/03/2015 0817   BUN 5* 02/14/2015 1547   CREATININE 0.7 04/03/2015 0817   CREATININE 0.65 02/14/2015 1547      Component Value Date/Time   CALCIUM 8.7 04/03/2015 0817   CALCIUM 9.3 02/14/2015 1547   ALKPHOS 86 04/03/2015 0817   ALKPHOS 61 02/14/2015 1547   AST 14 04/03/2015 0817   AST 15 02/14/2015 1547   ALT 17 04/03/2015 0817   ALT 12* 02/14/2015 1547   BILITOT 0.21 04/03/2015 0817   BILITOT 0.7 02/14/2015 1547       Lab Results  Component Value Date   WBC 11.1* 04/03/2015   HGB 10.6* 04/03/2015   HCT 32.0* 04/03/2015   MCV 84.1 04/03/2015   PLT 197 04/03/2015   NEUTROABS 8.9* 04/03/2015     RADIOGRAPHIC STUDIES: I have personally reviewed the radiology reports and agreed with their findings. No results found.   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 Patient enrolled in PREVENT clinical trial: Lipitor vs Placebo  Current treatment: Neo-adjuvant chemotherapy Dose dense Adriamycin and Cytoxan cycle 4 Chemo toxicities: 1. Constipation 2. Heartburn 3. Grade 1 nausea related to constipation 4. Grade 1 anemia: With a history of sickle cell trait. We will monitor this 5. Left groin infection with pus to that burst open after cycle 2 of AC: Received Diflucan for 7 days with Keflex for 7 days. Resolved. 6. Alopecia  Blood counts  reviewed and there is no evidence of neutropenia. Patient is refusing Dexamethasone today. Aware that she might have more nausea and is willing to take this risk. Will still get Aloxi and Emend with treatment today.  RTC in 2 weeks to begin weekly Abraxane X 12 weeks.   No orders of the defined types were placed in this encounter.   The patient has a good understanding of the overall plan. she agrees with it. she will call with any problems that may develop before the next visit here.   Mikey Bussing, NP

## 2015-04-03 NOTE — Patient Instructions (Signed)
[facility] Discharge Instructions for Patients Receiving Chemotherapy  Today you received the following chemotherapy agents:Adriamycin and Cytoxan   To help prevent nausea and vomiting after your treatment, we encourage you to take your nausea medication as directed.    If you develop nausea and vomiting that is not controlled by your nausea medication, call the clinic.   BELOW ARE SYMPTOMS THAT SHOULD BE REPORTED IMMEDIATELY:  *FEVER GREATER THAN 100.5 F  *CHILLS WITH OR WITHOUT FEVER  NAUSEA AND VOMITING THAT IS NOT CONTROLLED WITH YOUR NAUSEA MEDICATION  *UNUSUAL SHORTNESS OF BREATH  *UNUSUAL BRUISING OR BLEEDING  TENDERNESS IN MOUTH AND THROAT WITH OR WITHOUT PRESENCE OF ULCERS  *URINARY PROBLEMS  *BOWEL PROBLEMS  UNUSUAL RASH Items with * indicate a potential emergency and should be followed up as soon as possible.  Feel free to call the clinic you have any questions or concerns. The clinic phone number is [phone].  Please show the CHEMO ALERT CARD at check-in to the Emergency Department and triage nurse.   

## 2015-04-04 ENCOUNTER — Telehealth: Payer: Self-pay | Admitting: Hematology and Oncology

## 2015-04-04 NOTE — Telephone Encounter (Signed)
s.w. pt and advised on July appt....pt ok and aware she will pick up new sched at visit

## 2015-04-14 ENCOUNTER — Telehealth: Payer: Self-pay | Admitting: Medical Oncology

## 2015-04-14 NOTE — Telephone Encounter (Signed)
PREVENT Study. LVMOM with patient reminding her to bring her 2 month study calendars during her appointment at clinic next week and that research assistant, Deloria Lair will be by to collect them. I also informed patient that I will be out of the office next week, but should she have any questions, she can call Remer Macho, Naval architect, and provided her with Anna's office number.  Adele Dan, RN, Clinical Research 04/14/2015 12:39 PM

## 2015-04-18 ENCOUNTER — Encounter: Payer: Self-pay | Admitting: *Deleted

## 2015-04-18 ENCOUNTER — Ambulatory Visit (HOSPITAL_BASED_OUTPATIENT_CLINIC_OR_DEPARTMENT_OTHER): Payer: 59 | Admitting: Hematology and Oncology

## 2015-04-18 ENCOUNTER — Telehealth: Payer: Self-pay | Admitting: Hematology and Oncology

## 2015-04-18 ENCOUNTER — Encounter: Payer: Self-pay | Admitting: Hematology and Oncology

## 2015-04-18 ENCOUNTER — Ambulatory Visit (HOSPITAL_BASED_OUTPATIENT_CLINIC_OR_DEPARTMENT_OTHER): Payer: 59

## 2015-04-18 ENCOUNTER — Other Ambulatory Visit (HOSPITAL_BASED_OUTPATIENT_CLINIC_OR_DEPARTMENT_OTHER): Payer: 59

## 2015-04-18 VITALS — BP 137/78 | HR 82 | Temp 98.5°F | Resp 18 | Ht 65.5 in | Wt 182.0 lb

## 2015-04-18 DIAGNOSIS — C50411 Malignant neoplasm of upper-outer quadrant of right female breast: Secondary | ICD-10-CM

## 2015-04-18 DIAGNOSIS — Z5111 Encounter for antineoplastic chemotherapy: Secondary | ICD-10-CM | POA: Diagnosis not present

## 2015-04-18 LAB — CBC WITH DIFFERENTIAL/PLATELET
BASO%: 1.7 % (ref 0.0–2.0)
Basophils Absolute: 0 10*3/uL (ref 0.0–0.1)
EOS%: 0.2 % (ref 0.0–7.0)
Eosinophils Absolute: 0 10*3/uL (ref 0.0–0.5)
HCT: 30.2 % — ABNORMAL LOW (ref 34.8–46.6)
HEMOGLOBIN: 10.1 g/dL — AB (ref 11.6–15.9)
LYMPH%: 19.6 % (ref 14.0–49.7)
MCH: 28.8 pg (ref 25.1–34.0)
MCHC: 33.5 g/dL (ref 31.5–36.0)
MCV: 86 fL (ref 79.5–101.0)
MONO#: 0.5 10*3/uL (ref 0.1–0.9)
MONO%: 19.7 % — ABNORMAL HIGH (ref 0.0–14.0)
NEUT#: 1.5 10*3/uL (ref 1.5–6.5)
NEUT%: 58.8 % (ref 38.4–76.8)
PLATELETS: 221 10*3/uL (ref 145–400)
RBC: 3.51 10*6/uL — ABNORMAL LOW (ref 3.70–5.45)
RDW: 20.8 % — ABNORMAL HIGH (ref 11.2–14.5)
WBC: 2.5 10*3/uL — ABNORMAL LOW (ref 3.9–10.3)
lymph#: 0.5 10*3/uL — ABNORMAL LOW (ref 0.9–3.3)

## 2015-04-18 LAB — COMPREHENSIVE METABOLIC PANEL (CC13)
ALK PHOS: 60 U/L (ref 40–150)
ALT: 13 U/L (ref 0–55)
AST: 12 U/L (ref 5–34)
Albumin: 3.4 g/dL — ABNORMAL LOW (ref 3.5–5.0)
Anion Gap: 8 mEq/L (ref 3–11)
BUN: 9.3 mg/dL (ref 7.0–26.0)
CO2: 26 mEq/L (ref 22–29)
Calcium: 8.7 mg/dL (ref 8.4–10.4)
Chloride: 106 mEq/L (ref 98–109)
Creatinine: 0.7 mg/dL (ref 0.6–1.1)
EGFR: >90 mL/min/{1.73_m2} (ref >90)
GLUCOSE: 154 mg/dL — AB (ref 70–140)
Potassium: 3.5 mEq/L (ref 3.5–5.1)
Sodium: 141 mEq/L (ref 136–145)
TOTAL PROTEIN: 6.6 g/dL (ref 6.4–8.3)
Total Bilirubin: 0.3 mg/dL (ref 0.20–1.20)

## 2015-04-18 MED ORDER — HEPARIN SOD (PORK) LOCK FLUSH 100 UNIT/ML IV SOLN
500.0000 [IU] | Freq: Once | INTRAVENOUS | Status: AC | PRN
  Administered 2015-04-18: 500 [IU]
  Filled 2015-04-18: qty 5

## 2015-04-18 MED ORDER — PACLITAXEL PROTEIN-BOUND CHEMO INJECTION 100 MG
80.0000 mg/m2 | Freq: Once | INTRAVENOUS | Status: AC
  Administered 2015-04-18: 150 mg via INTRAVENOUS
  Filled 2015-04-18: qty 30

## 2015-04-18 MED ORDER — SODIUM CHLORIDE 0.9 % IV SOLN
Freq: Once | INTRAVENOUS | Status: AC
  Administered 2015-04-18: 10:00:00 via INTRAVENOUS
  Filled 2015-04-18: qty 4

## 2015-04-18 MED ORDER — SODIUM CHLORIDE 0.9 % IV SOLN
Freq: Once | INTRAVENOUS | Status: AC
  Administered 2015-04-18: 10:00:00 via INTRAVENOUS

## 2015-04-18 MED ORDER — SODIUM CHLORIDE 0.9 % IJ SOLN
10.0000 mL | INTRAMUSCULAR | Status: DC | PRN
  Administered 2015-04-18: 10 mL
  Filled 2015-04-18: qty 10

## 2015-04-18 NOTE — Telephone Encounter (Signed)
Appointments made and avs printed for patient,per dr [name] she can skip a visit on 7/11 and be seen on 7/18

## 2015-04-18 NOTE — Progress Notes (Signed)
Patient Care Team: [name], MD as PCP - General (Family Medicine) Chauncey Cruel, MD as Consulting Physician (Oncology) Thea Silversmith, MD as Consulting Physician (Radiation Oncology) Mauro Kaufmann, RN as Registered Nurse Rockwell Germany, RN as Registered Nurse Alphonsa Overall, MD as Consulting Physician (General Surgery)  DIAGNOSIS: Breast cancer of upper-outer quadrant of right female breast   Staging form: Breast, AJCC 7th Edition     Clinical stage from 02/08/2015: Stage IIIA (T3, N1, M0) - Unsigned   SUMMARY OF ONCOLOGIC HISTORY:   Breast cancer of upper-outer quadrant of right female breast   01/27/2015 Initial Diagnosis Right breast 9:00 and 10:00 biopsy: Grade 3 IDC ER/PR negative, HER-2 negative, Ki-67 77% and 73%; the right axillary lymph node biopsy: EF 22% positive   01/30/2015 Breast MRI Right breast masses 9 and 10:00 position, additional irregular masses with non-mass enhancement upper outer quadrant with one mass extending into upper inner quadrant, multifocal, multicentric 13.7 x 4.6 x 4.6 cm, malignant right axillary lymph node   02/15/2015 Imaging No evidence of metastatic disease, 2.2 cm enhancing lesion anterior left lower kidney suspicious for solid renal neoplasm   02/20/2015 -  Neo-Adjuvant Chemotherapy Adriamycin and cytoxan dose dense Q 2 weeks X 4 foll by Abraxane weekly x 12    CHIEF COMPLIANT: Abraxane week 1  INTERVAL HISTORY: Phyllis Pruitt is a 44 year old with above-mentioned history of right-sided breast cancer currently on neo-adjuvant chemotherapy. She completed 4 cycles of Adriamycin and Cytoxan and today she stops week 1 of Abraxane. She reports that she did much better with the last cycle as she did not receive dexamethasone. Her stomach feels a lot better. She has not had a menstrual period since starting of chemotherapy and has profound sweats especially on her scalp. She tells me today that she will not receive 12 weeks of Abraxane. She plans to  stop this treatment prior to finishing her entire treatment. It appears that she is very worried about all the bills that are coming up and the cost of care. Today her blood counts show any ANC of 1500. It appears that she removed the Neulasta injection on dose prior to completion of the injection.   REVIEW OF SYSTEMS:   Constitutional: Denies fevers, chills or abnormal weight loss Eyes: Denies blurriness of vision Ears, nose, mouth, throat, and face: Denies mucositis or sore throat Respiratory: Denies cough, dyspnea or wheezes Cardiovascular: Denies palpitation, chest discomfort or lower extremity swelling Gastrointestinal:  Denies nausea, heartburn or change in bowel habits Skin: Denies abnormal skin rashes Lymphatics: Denies new lymphadenopathy or easy bruising Neurological:Denies numbness, tingling or new weaknesses Behavioral/Psych: Mood is stable, no new changes  Breast: The breast mass feels smaller All other systems were reviewed with the patient and are negative.  I have reviewed the past medical history, past surgical history, social history and family history with the patient and they are unchanged from previous note.  ALLERGIES:  has No Known Allergies.  MEDICATIONS:  Current Outpatient Prescriptions  Medication Sig Dispense Refill  . Atorvastatin Calcium (INVESTIGATIONAL ATORVASTATIN/PLACEBO) 40 MG tablet [facility] [id number] Take 1 tablet by mouth daily. Take 2 doses (these doses must be 12 hours apart) prior to first chemotherapy treatment. Then take 1 tablet daily by mouth with or without food. 180 tablet 0  . diphenhydramine-acetaminophen (TYLENOL PM) 25-500 MG TABS Take 1 tablet by mouth at bedtime as needed (sleep).     Marland Kitchen ECHINACEA PO Take 1 tablet by mouth daily.     Marland Kitchen  Ginger, Zingiber officinalis, (GINGER ROOT PO) Take 1 tablet by mouth daily.     Marland Kitchen HYDROcodone-acetaminophen (NORCO/VICODIN) 5-325 MG per tablet     . lidocaine-prilocaine (EMLA) cream Apply to  affected area once 30 g 3  . LORazepam (ATIVAN) 0.5 MG tablet Take 1 tablet (0.5 mg total) by mouth at bedtime. 30 tablet 0  . Multiple Vitamin (MULTIVITAMIN WITH MINERALS) TABS tablet Take 1 tablet by mouth daily.    . ondansetron (ZOFRAN) 8 MG tablet Take 1 tablet (8 mg total) by mouth 2 (two) times daily as needed. Start on the third day after chemotherapy. 30 tablet 1  . prochlorperazine (COMPAZINE) 10 MG tablet Take 1 tablet (10 mg total) by mouth every 6 (six) hours as needed (Nausea or vomiting). 30 tablet 1  . TURMERIC CURCUMIN PO Take 1 capsule by mouth daily.      No current facility-administered medications for this visit.    PHYSICAL EXAMINATION: ECOG PERFORMANCE STATUS: 1 - Symptomatic but completely ambulatory  Filed Vitals:   04/18/15 0846  BP: 137/78  Pulse: 82  Temp: 98.5 F (36.9 C)  Resp: 18   Filed Weights   04/18/15 0846  Weight: 182 lb (82.555 kg)    GENERAL:alert, no distress and comfortable SKIN: skin color, texture, turgor are normal, no rashes or significant lesions EYES: normal, Conjunctiva are pink and non-injected, sclera clear OROPHARYNX:no exudate, no erythema and lips, buccal mucosa, and tongue normal  NECK: supple, thyroid normal size, non-tender, without nodularity LYMPH:  no palpable lymphadenopathy in the cervical, axillary or inguinal LUNGS: clear to auscultation and percussion with normal breathing effort HEART: regular rate & rhythm and no murmurs and no lower extremity edema ABDOMEN:abdomen soft, non-tender and normal bowel sounds Musculoskeletal:no cyanosis of digits and no clubbing  NEURO: alert & oriented x 3 with fluent speech, no focal motor/sensory deficits  LABORATORY DATA:  I have reviewed the data as listed   Chemistry      Component Value Date/Time   NA 141 04/18/2015 0836   NA 139 02/14/2015 1547   K 3.5 04/18/2015 0836   K 3.5 02/14/2015 1547   CL 104 02/14/2015 1547   CO2 26 04/18/2015 0836   CO2 24 02/14/2015 1547    BUN 9.3 04/18/2015 0836   BUN 5* 02/14/2015 1547   CREATININE 0.7 04/18/2015 0836   CREATININE 0.65 02/14/2015 1547      Component Value Date/Time   CALCIUM 8.7 04/18/2015 0836   CALCIUM 9.3 02/14/2015 1547   ALKPHOS 60 04/18/2015 0836   ALKPHOS 61 02/14/2015 1547   AST 12 04/18/2015 0836   AST 15 02/14/2015 1547   ALT 13 04/18/2015 0836   ALT 12* 02/14/2015 1547   BILITOT 0.30 04/18/2015 0836   BILITOT 0.7 02/14/2015 1547       Lab Results  Component Value Date   WBC 2.5* 04/18/2015   HGB 10.1* 04/18/2015   HCT 30.2* 04/18/2015   MCV 86.0 04/18/2015   PLT 221 04/18/2015   NEUTROABS 1.5 04/18/2015   ASSESSMENT & PLAN:  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 Patient enrolled in PREVENT clinical trial: Lipitor vs Placebo (no toxicities from this study)  Current treatment: Neo-adjuvant chemotherapy completed Dose dense Adriamycin and Cytoxan 4; today is cycle 1/12 of weekly Abraxane  Chemo toxicities: 1. Constipation 2. Heartburn 3. Grade 1 nausea related to constipation 4. Grade 1 anemia: With a history of sickle cell trait. We will monitor this 5. Left groin infection with pus to that burst open after cycle 2 of AC: Received Diflucan for 7 days with Keflex for 7 days. Resolved. 6. Alopecia 7. Sweats: Patient is very unhappy about the sweats but does not want to take any prescription medication. 8. Financial toxicity: Patient is very stressed out about all  the bills. I instructed her to bring them to our financial counselors to assist her.   Blood counts reviewed . Her ANC is 1500. Apparently she removed the Neulasta before the shot was given. This is the cause of her low blood counts and not as a side effect of chemotherapy.  RTC in 2 weeks for follow-ups and weekly for Abraxane..   No orders of the defined types were placed in this encounter.   The patient has a good understanding of the overall plan. she agrees with it. she will call with any problems that may develop before the next visit here.   Rulon Eisenmenger, MD

## 2015-04-18 NOTE — Patient Instructions (Signed)
[facility] Discharge Instructions for Patients Receiving Chemotherapy  Today you received the following chemotherapy agents: Abraxane  To help prevent nausea and vomiting after your treatment, we encourage you to take your nausea medication as prescribed.  If you develop nausea and vomiting that is not controlled by your nausea medication, call the clinic.   BELOW ARE SYMPTOMS THAT SHOULD BE REPORTED IMMEDIATELY:  *FEVER GREATER THAN 100.5 F  *CHILLS WITH OR WITHOUT FEVER  NAUSEA AND VOMITING THAT IS NOT CONTROLLED WITH YOUR NAUSEA MEDICATION  *UNUSUAL SHORTNESS OF BREATH  *UNUSUAL BRUISING OR BLEEDING  TENDERNESS IN MOUTH AND THROAT WITH OR WITHOUT PRESENCE OF ULCERS  *URINARY PROBLEMS  *BOWEL PROBLEMS  UNUSUAL RASH Items with * indicate a potential emergency and should be followed up as soon as possible.  Feel free to call the clinic you have any questions or concerns. The clinic phone number is [phone].  Please show the [place] [facility] at check-in to the Emergency Department and triage nurse.  Nanoparticle Albumin-Bound Paclitaxel injection What is this medicine? NANOPARTICLE ALBUMIN-BOUND PACLITAXEL (Na no PAHR ti kuhl al BYOO muhn-bound PAK li TAX el) is a chemotherapy drug. It targets fast dividing cells, like cancer cells, and causes these cells to die. This medicine is used to treat advanced breast cancer and advanced lung cancer. This medicine may be used for other purposes; ask your health care provider or pharmacist if you have questions. COMMON BRAND NAME(S): Abraxane What should I tell my health care provider before I take this medicine? They need to know if you have any of these conditions: -kidney disease -liver disease -low blood counts, like low platelets, red blood cells, or white blood cells -recent or ongoing radiation therapy -an unusual or allergic reaction to paclitaxel, albumin, other chemotherapy, other medicines, foods,  dyes, or preservatives -pregnant or trying to get pregnant -breast-feeding How should I use this medicine? This drug is given as an infusion into a vein. It is administered in a hospital or clinic by a specially trained health care professional. Talk to your pediatrician regarding the use of this medicine in children. Special care may be needed. Overdosage: If you think you have taken too much of this medicine contact a poison control center or emergency room at once. NOTE: This medicine is only for you. Do not share this medicine with others. What if I miss a dose? It is important not to miss your dose. Call your doctor or health care professional if you are unable to keep an appointment. What may interact with this medicine? -cyclosporine -diazepam -ketoconazole -medicines to increase blood counts like filgrastim, pegfilgrastim, sargramostim -other chemotherapy drugs like cisplatin, doxorubicin, epirubicin, etoposide, teniposide, vincristine -quinidine -testosterone -vaccines -verapamil Talk to your doctor or health care professional before taking any of these medicines: -acetaminophen -aspirin -ibuprofen -ketoprofen -naproxen This list may not describe all possible interactions. Give your health care provider a list of all the medicines, herbs, non-prescription drugs, or dietary supplements you use. Also tell them if you smoke, drink alcohol, or use illegal drugs. Some items may interact with your medicine. What should I watch for while using this medicine? Your condition will be monitored carefully while you are receiving this medicine. You will need important blood work done while you are taking this medicine. This drug may make you feel generally unwell. This is not uncommon, as chemotherapy can affect healthy cells as well as cancer cells. Report any side effects. Continue your course of treatment even though  you feel ill unless your doctor tells you to stop. In some cases, you  may be given additional medicines to help with side effects. Follow all directions for their use. Call your doctor or health care professional for advice if you get a fever, chills or sore throat, or other symptoms of a cold or flu. Do not treat yourself. This drug decreases your body's ability to fight infections. Try to avoid being around people who are sick. This medicine may increase your risk to bruise or bleed. Call your doctor or health care professional if you notice any unusual bleeding. Be careful brushing and flossing your teeth or using a toothpick because you may get an infection or bleed more easily. If you have any dental work done, tell your dentist you are receiving this medicine. Avoid taking products that contain aspirin, acetaminophen, ibuprofen, naproxen, or ketoprofen unless instructed by your doctor. These medicines may hide a fever. Do not become pregnant while taking this medicine. Women should inform their doctor if they wish to become pregnant or think they might be pregnant. There is a potential for serious side effects to an unborn child. Talk to your health care professional or pharmacist for more information. Do not breast-feed an infant while taking this medicine. Men are advised not to father a child while receiving this medicine. What side effects may I notice from receiving this medicine? Side effects that you should report to your doctor or health care professional as soon as possible: -allergic reactions like skin rash, itching or hives, swelling of the face, lips, or tongue -low blood counts - This drug may decrease the number of white blood cells, red blood cells and platelets. You may be at increased risk for infections and bleeding. -signs of infection - fever or chills, cough, sore throat, pain or difficulty passing urine -signs of decreased platelets or bleeding - bruising, pinpoint red spots on the skin, black, tarry stools, nosebleeds -signs of decreased red  blood cells - unusually weak or tired, fainting spells, lightheadedness -breathing problems -changes in vision -chest pain -high or low blood pressure -mouth sores -nausea and vomiting -pain, swelling, redness or irritation at the injection site -pain, tingling, numbness in the hands or feet -slow or irregular heartbeat -swelling of the ankle, feet, hands Side effects that usually do not require medical attention (report to your doctor or health care professional if they continue or are bothersome): -aches, pains -changes in the color of fingernails -diarrhea -hair loss -loss of appetite This list may not describe all possible side effects. Call your doctor for medical advice about side effects. You may report side effects to FDA at 1-800-FDA-1088. Where should I keep my medicine? This drug is given in a hospital or clinic and will not be stored at home. NOTE: This sheet is a summary. It may not cover all possible information. If you have questions about this medicine, talk to your doctor, pharmacist, or health care provider.  2015, [employer]vier/Gold Standard. (2012-11-23 16:48:50)

## 2015-04-18 NOTE — Assessment & Plan Note (Signed)
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 Patient enrolled in PREVENT clinical trial: Lipitor vs Placebo (no toxicities from this study)  Current treatment: Neo-adjuvant chemotherapy completed Dose dense Adriamycin and Cytoxan 4; today is cycle 1/12 of weekly Abraxane  Chemo toxicities: 1. Constipation 2. Heartburn 3. Grade 1 nausea related to constipation 4. Grade 1 anemia: With a history of sickle cell trait. We will monitor this 5. Left groin infection with pus to that burst open after cycle 2 of AC: Received Diflucan for 7 days with Keflex for 7 days. Resolved. 6. Alopecia  Blood counts reviewed and they're adequate for treatment RTC in 2 weeks for follow-ups and weekly for Abraxane.Marland Kitchen

## 2015-04-19 ENCOUNTER — Telehealth: Payer: Self-pay | Admitting: *Deleted

## 2015-04-19 NOTE — Telephone Encounter (Signed)
Patient received 1st Abraxane yesterday. Patient states it was wonderful. She feels well and is eating and drinking without problems. Patient knows to call with any questions or concerns.

## 2015-04-21 ENCOUNTER — Other Ambulatory Visit: Payer: Self-pay | Admitting: *Deleted

## 2015-04-21 DIAGNOSIS — C50411 Malignant neoplasm of upper-outer quadrant of right female breast: Secondary | ICD-10-CM

## 2015-04-21 NOTE — Assessment & Plan Note (Signed)
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 Patient enrolled in PREVENT clinical trial: Lipitor vs Placebo (no toxicities from this study)  Current treatment: Neo-adjuvant chemotherapy completed Dose dense Adriamycin and Cytoxan 4; today is cycle 3/12 of weekly Abraxane  Chemo toxicities: 1. Constipation 2. Heartburn 3. Grade 1 nausea related to constipation 4. Grade 1 anemia: With a history of sickle cell trait. We will monitor this 5. Left groin infection with pus to that burst open after cycle 2 of AC: Received Diflucan for 7 days with Keflex for 7 days. Resolved. 6. Alopecia  Blood counts reviewed and they're adequate for treatment RTC in 2 weeks for follow-ups and weekly for Abraxane.Marland Kitchen

## 2015-04-24 ENCOUNTER — Other Ambulatory Visit (HOSPITAL_BASED_OUTPATIENT_CLINIC_OR_DEPARTMENT_OTHER): Payer: 59

## 2015-04-24 ENCOUNTER — Encounter: Payer: Self-pay | Admitting: *Deleted

## 2015-04-24 ENCOUNTER — Ambulatory Visit (HOSPITAL_BASED_OUTPATIENT_CLINIC_OR_DEPARTMENT_OTHER): Payer: 59

## 2015-04-24 VITALS — BP 120/81 | HR 78 | Temp 99.1°F | Resp 20

## 2015-04-24 DIAGNOSIS — Z5111 Encounter for antineoplastic chemotherapy: Secondary | ICD-10-CM | POA: Diagnosis not present

## 2015-04-24 DIAGNOSIS — Z006 Encounter for examination for normal comparison and control in clinical research program: Secondary | ICD-10-CM

## 2015-04-24 DIAGNOSIS — C50411 Malignant neoplasm of upper-outer quadrant of right female breast: Secondary | ICD-10-CM | POA: Diagnosis not present

## 2015-04-24 LAB — CBC WITH DIFFERENTIAL/PLATELET
BASO%: 1.8 % (ref 0.0–2.0)
BASOS ABS: 0.1 10*3/uL (ref 0.0–0.1)
EOS%: 0.2 % (ref 0.0–7.0)
Eosinophils Absolute: 0 10*3/uL (ref 0.0–0.5)
HCT: 30.2 % — ABNORMAL LOW (ref 34.8–46.6)
HGB: 10.1 g/dL — ABNORMAL LOW (ref 11.6–15.9)
LYMPH%: 21.2 % (ref 14.0–49.7)
MCH: 29 pg (ref 25.1–34.0)
MCHC: 33.6 g/dL (ref 31.5–36.0)
MCV: 86.6 fL (ref 79.5–101.0)
MONO#: 0.5 10*3/uL (ref 0.1–0.9)
MONO%: 17.3 % — ABNORMAL HIGH (ref 0.0–14.0)
NEUT%: 59.5 % (ref 38.4–76.8)
NEUTROS ABS: 1.8 10*3/uL (ref 1.5–6.5)
Platelets: 314 10*3/uL (ref 145–400)
RBC: 3.49 10*6/uL — ABNORMAL LOW (ref 3.70–5.45)
RDW: 20.2 % — ABNORMAL HIGH (ref 11.2–14.5)
WBC: 3.1 10*3/uL — ABNORMAL LOW (ref 3.9–10.3)
lymph#: 0.7 10*3/uL — ABNORMAL LOW (ref 0.9–3.3)

## 2015-04-24 LAB — COMPREHENSIVE METABOLIC PANEL (CC13)
ALBUMIN: 3.4 g/dL — AB (ref 3.5–5.0)
ALT: 32 U/L (ref 0–55)
ANION GAP: 6 meq/L (ref 3–11)
AST: 23 U/L (ref 5–34)
Alkaline Phosphatase: 59 U/L (ref 40–150)
BUN: 8.3 mg/dL (ref 7.0–26.0)
CO2: 29 meq/L (ref 22–29)
Calcium: 9.1 mg/dL (ref 8.4–10.4)
Chloride: 108 mEq/L (ref 98–109)
Creatinine: 0.7 mg/dL (ref 0.6–1.1)
GLUCOSE: 115 mg/dL (ref 70–140)
Potassium: 3.9 mEq/L (ref 3.5–5.1)
SODIUM: 143 meq/L (ref 136–145)
Total Bilirubin: 0.28 mg/dL (ref 0.20–1.20)
Total Protein: 6.6 g/dL (ref 6.4–8.3)

## 2015-04-24 MED ORDER — SODIUM CHLORIDE 0.9 % IJ SOLN
10.0000 mL | INTRAMUSCULAR | Status: DC | PRN
  Administered 2015-04-24: 10 mL
  Filled 2015-04-24: qty 10

## 2015-04-24 MED ORDER — PACLITAXEL PROTEIN-BOUND CHEMO INJECTION 100 MG
80.0000 mg/m2 | Freq: Once | INTRAVENOUS | Status: AC
  Administered 2015-04-24: 150 mg via INTRAVENOUS
  Filled 2015-04-24: qty 30

## 2015-04-24 MED ORDER — SODIUM CHLORIDE 0.9 % IV SOLN
Freq: Once | INTRAVENOUS | Status: AC
  Administered 2015-04-24: 10:00:00 via INTRAVENOUS

## 2015-04-24 MED ORDER — SODIUM CHLORIDE 0.9 % IV SOLN
Freq: Once | INTRAVENOUS | Status: AC
  Administered 2015-04-24: 10:00:00 via INTRAVENOUS
  Filled 2015-04-24: qty 4

## 2015-04-24 MED ORDER — HEPARIN SOD (PORK) LOCK FLUSH 100 UNIT/ML IV SOLN
500.0000 [IU] | Freq: Once | INTRAVENOUS | Status: AC | PRN
  Administered 2015-04-24: 500 [IU]
  Filled 2015-04-24: qty 5

## 2015-04-24 NOTE — Patient Instructions (Signed)
[facility] Discharge Instructions for Patients Receiving Chemotherapy  Today you received the following chemotherapy agents:  Abraxane  To help prevent nausea and vomiting after your treatment, we encourage you to take your nausea medication as ordered per MD.   If you develop nausea and vomiting that is not controlled by your nausea medication, call the clinic.   BELOW ARE SYMPTOMS THAT SHOULD BE REPORTED IMMEDIATELY:  *FEVER GREATER THAN 100.5 F  *CHILLS WITH OR WITHOUT FEVER  NAUSEA AND VOMITING THAT IS NOT CONTROLLED WITH YOUR NAUSEA MEDICATION  *UNUSUAL SHORTNESS OF BREATH  *UNUSUAL BRUISING OR BLEEDING  TENDERNESS IN MOUTH AND THROAT WITH OR WITHOUT PRESENCE OF ULCERS  *URINARY PROBLEMS  *BOWEL PROBLEMS  UNUSUAL RASH Items with * indicate a potential emergency and should be followed up as soon as possible.  Feel free to call the clinic you have any questions or concerns. The clinic phone number is [phone].  Please show the CHEMO ALERT CARD at check-in to the Emergency Department and triage nurse.   

## 2015-05-01 ENCOUNTER — Other Ambulatory Visit (HOSPITAL_BASED_OUTPATIENT_CLINIC_OR_DEPARTMENT_OTHER): Payer: 59

## 2015-05-01 ENCOUNTER — Other Ambulatory Visit: Payer: 59

## 2015-05-01 ENCOUNTER — Other Ambulatory Visit: Payer: Self-pay

## 2015-05-01 ENCOUNTER — Ambulatory Visit (HOSPITAL_BASED_OUTPATIENT_CLINIC_OR_DEPARTMENT_OTHER): Payer: 59 | Admitting: Hematology and Oncology

## 2015-05-01 ENCOUNTER — Encounter: Payer: Self-pay | Admitting: Hematology and Oncology

## 2015-05-01 ENCOUNTER — Ambulatory Visit (HOSPITAL_BASED_OUTPATIENT_CLINIC_OR_DEPARTMENT_OTHER): Payer: 59

## 2015-05-01 VITALS — BP 143/91 | HR 73 | Temp 98.8°F | Resp 18 | Ht 65.5 in | Wt 184.5 lb

## 2015-05-01 DIAGNOSIS — C50411 Malignant neoplasm of upper-outer quadrant of right female breast: Secondary | ICD-10-CM

## 2015-05-01 DIAGNOSIS — D6481 Anemia due to antineoplastic chemotherapy: Secondary | ICD-10-CM | POA: Diagnosis not present

## 2015-05-01 DIAGNOSIS — Z5111 Encounter for antineoplastic chemotherapy: Secondary | ICD-10-CM | POA: Diagnosis not present

## 2015-05-01 DIAGNOSIS — Z171 Estrogen receptor negative status [ER-]: Secondary | ICD-10-CM

## 2015-05-01 DIAGNOSIS — C773 Secondary and unspecified malignant neoplasm of axilla and upper limb lymph nodes: Secondary | ICD-10-CM

## 2015-05-01 LAB — COMPREHENSIVE METABOLIC PANEL (CC13)
ALT: 43 U/L (ref 0–55)
ANION GAP: 5 meq/L (ref 3–11)
AST: 29 U/L (ref 5–34)
Albumin: 3.6 g/dL (ref 3.5–5.0)
Alkaline Phosphatase: 65 U/L (ref 40–150)
BUN: 8.8 mg/dL (ref 7.0–26.0)
CO2: 29 mEq/L (ref 22–29)
Calcium: 9.1 mg/dL (ref 8.4–10.4)
Chloride: 106 mEq/L (ref 98–109)
Creatinine: 0.7 mg/dL (ref 0.6–1.1)
EGFR: >90 mL/min/{1.73_m2} (ref >90)
Glucose: 97 mg/dl (ref 70–140)
Potassium: 3.7 mEq/L (ref 3.5–5.1)
SODIUM: 140 meq/L (ref 136–145)
Total Bilirubin: 0.34 mg/dL (ref 0.20–1.20)
Total Protein: 7 g/dL (ref 6.4–8.3)

## 2015-05-01 LAB — CBC WITH DIFFERENTIAL/PLATELET
BASO%: 0.9 % (ref 0.0–2.0)
BASOS ABS: 0.1 10*3/uL (ref 0.0–0.1)
EOS ABS: 0 10*3/uL (ref 0.0–0.5)
EOS%: 0.3 % (ref 0.0–7.0)
HEMATOCRIT: 32 % — AB (ref 34.8–46.6)
HEMOGLOBIN: 10.7 g/dL — AB (ref 11.6–15.9)
LYMPH%: 14.3 % (ref 14.0–49.7)
MCH: 28.9 pg (ref 25.1–34.0)
MCHC: 33.4 g/dL (ref 31.5–36.0)
MCV: 86.7 fL (ref 79.5–101.0)
MONO#: 0.7 10*3/uL (ref 0.1–0.9)
MONO%: 12.4 % (ref 0.0–14.0)
NEUT#: 3.9 10*3/uL (ref 1.5–6.5)
NEUT%: 72.1 % (ref 38.4–76.8)
PLATELETS: 322 10*3/uL (ref 145–400)
RBC: 3.69 10*6/uL — ABNORMAL LOW (ref 3.70–5.45)
RDW: 20.3 % — AB (ref 11.2–14.5)
WBC: 5.4 10*3/uL (ref 3.9–10.3)
lymph#: 0.8 10*3/uL — ABNORMAL LOW (ref 0.9–3.3)

## 2015-05-01 MED ORDER — SODIUM CHLORIDE 0.9 % IV SOLN
Freq: Once | INTRAVENOUS | Status: AC
  Administered 2015-05-01: 10:00:00 via INTRAVENOUS

## 2015-05-01 MED ORDER — PACLITAXEL PROTEIN-BOUND CHEMO INJECTION 100 MG
80.0000 mg/m2 | Freq: Once | INTRAVENOUS | Status: AC
  Administered 2015-05-01: 150 mg via INTRAVENOUS
  Filled 2015-05-01: qty 30

## 2015-05-01 MED ORDER — SODIUM CHLORIDE 0.9 % IJ SOLN
10.0000 mL | INTRAMUSCULAR | Status: DC | PRN
  Administered 2015-05-01: 10 mL
  Filled 2015-05-01: qty 10

## 2015-05-01 MED ORDER — ONDANSETRON HCL 40 MG/20ML IJ SOLN
Freq: Once | INTRAMUSCULAR | Status: AC
  Administered 2015-05-01: 10:00:00 via INTRAVENOUS
  Filled 2015-05-01: qty 4

## 2015-05-01 MED ORDER — HEPARIN SOD (PORK) LOCK FLUSH 100 UNIT/ML IV SOLN
500.0000 [IU] | Freq: Once | INTRAVENOUS | Status: AC | PRN
  Administered 2015-05-01: 500 [IU]
  Filled 2015-05-01: qty 5

## 2015-05-01 NOTE — Patient Instructions (Signed)
[facility] Discharge Instructions for Patients Receiving Chemotherapy  Today you received the following chemotherapy agents; Abraxene.   To help prevent nausea and vomiting after your treatment, we encourage you to take your nausea medication as directed.    If you develop nausea and vomiting that is not controlled by your nausea medication, call the clinic.   BELOW ARE SYMPTOMS THAT SHOULD BE REPORTED IMMEDIATELY:  *FEVER GREATER THAN 100.5 F  *CHILLS WITH OR WITHOUT FEVER  NAUSEA AND VOMITING THAT IS NOT CONTROLLED WITH YOUR NAUSEA MEDICATION  *UNUSUAL SHORTNESS OF BREATH  *UNUSUAL BRUISING OR BLEEDING  TENDERNESS IN MOUTH AND THROAT WITH OR WITHOUT PRESENCE OF ULCERS  *URINARY PROBLEMS  *BOWEL PROBLEMS  UNUSUAL RASH Items with * indicate a potential emergency and should be followed up as soon as possible.  Feel free to call the clinic you have any questions or concerns. The clinic phone number is [phone].  Please show the CHEMO ALERT CARD at check-in to the Emergency Department and triage nurse.   

## 2015-05-01 NOTE — Progress Notes (Signed)
Patient Care Team: [name], MD as PCP - General (Family Medicine) Chauncey Cruel, MD as Consulting Physician (Oncology) Thea Silversmith, MD as Consulting Physician (Radiation Oncology) Mauro Kaufmann, RN as Registered Nurse Rockwell Germany, RN as Registered Nurse Alphonsa Overall, MD as Consulting Physician (General Surgery)  DIAGNOSIS: Breast cancer of upper-outer quadrant of right female breast   Staging form: Breast, AJCC 7th Edition     Clinical stage from 02/08/2015: Stage IIIA (T3, N1, M0) - Unsigned   SUMMARY OF ONCOLOGIC HISTORY:   Breast cancer of upper-outer quadrant of right female breast   01/27/2015 Initial Diagnosis Right breast 9:00 and 10:00 biopsy: Grade 3 IDC ER/PR negative, HER-2 negative, Ki-67 77% and 73%; the right axillary lymph node biopsy: EF 22% positive   01/30/2015 Breast MRI Right breast masses 9 and 10:00 position, additional irregular masses with non-mass enhancement upper outer quadrant with one mass extending into upper inner quadrant, multifocal, multicentric 13.7 x 4.6 x 4.6 cm, malignant right axillary lymph node   02/15/2015 Imaging No evidence of metastatic disease, 2.2 cm enhancing lesion anterior left lower kidney suspicious for solid renal neoplasm   02/20/2015 -  Neo-Adjuvant Chemotherapy Adriamycin and cytoxan dose dense Q 2 weeks X 4 foll by Abraxane weekly x 12    CHIEF COMPLIANT: Week 3 Abraxane  INTERVAL HISTORY: Phyllis Pruitt is a 44 year old with above-mentioned history right breast cancer currently on neo-adjuvant chemotherapy with Abraxane. She is tolerating Abraxane extremely well. She has noticed some cracking of the skin on the hands. Denies any tingling or numbness. She also has a rash on the face for which she is using alcohol. She complains of tingling in the lower back area as well as in the upper arm area. Does not have any peripheral neuropathy. Denies any nausea or vomiting. She does not have adequate taste.  REVIEW OF SYSTEMS:    Constitutional: Denies fevers, chills or abnormal weight loss, alopecia, fatigue Eyes: Denies blurriness of vision Ears, nose, mouth, throat, and face: Denies mucositis or sore throat Respiratory: Denies cough, dyspnea or wheezes Cardiovascular: Denies palpitation, chest discomfort or lower extremity swelling Gastrointestinal:  Denies nausea, heartburn or change in bowel habits Skin: Rash on the face, cracks in the skin of the hands Lymphatics: Denies new lymphadenopathy or easy bruising Neurological: Numbness on the back and arm Behavioral/Psych: Mood is stable, no new changes  All other systems were reviewed with the patient and are negative.  I have reviewed the past medical history, past surgical history, social history and family history with the patient and they are unchanged from previous note.  ALLERGIES:  has No Known Allergies.  MEDICATIONS:  Current Outpatient Prescriptions  Medication Sig Dispense Refill  . Atorvastatin Calcium (INVESTIGATIONAL ATORVASTATIN/PLACEBO) 40 MG tablet [facility] [address] Take 1 tablet by mouth daily. Take 2 doses (these doses must be 12 hours apart) prior to first chemotherapy treatment. Then take 1 tablet daily by mouth with or without food. 180 tablet 0  . dexamethasone (DECADRON) 4 MG tablet TAKE 1 TABLET 1X A DAY ON THE DAY AFTER CHEMOTHERAPY& THEN 1 TABLET 2X A DAY X 2 DAYS W/ FOOD  1  . diphenhydramine-acetaminophen (TYLENOL PM) 25-500 MG TABS Take 1 tablet by mouth at bedtime as needed (sleep).     Marland Kitchen ECHINACEA PO Take 1 tablet by mouth daily.     . fluconazole (DIFLUCAN) 100 MG tablet Take 100 mg by mouth daily.  0  . Ginger, Zingiber officinalis, (GINGER ROOT  PO) Take 1 tablet by mouth daily.     Marland Kitchen HYDROcodone-acetaminophen (NORCO/VICODIN) 5-325 MG per tablet     . LORazepam (ATIVAN) 0.5 MG tablet Take 1 tablet (0.5 mg total) by mouth at bedtime. 30 tablet 0  . Multiple Vitamin (MULTIVITAMIN WITH MINERALS) TABS tablet Take 1 tablet by  mouth daily.    . ondansetron (ZOFRAN) 8 MG tablet TAKE 1 TABLET TWICE A DAY AS NEEDED , START ON THE THIRD DAY AFTER CHEMOTHERAPY  1  . prochlorperazine (COMPAZINE) 10 MG tablet TAKE 1 TABLET (10 MG TOTAL) BY MOUTH EVERY 6 (SIX) HOURS AS NEEDED (NAUSEA OR VOMITING).  1  . TURMERIC CURCUMIN PO Take 1 capsule by mouth daily.      No current facility-administered medications for this visit.    PHYSICAL EXAMINATION: ECOG PERFORMANCE STATUS: 1 - Symptomatic but completely ambulatory  Filed Vitals:   05/01/15 0851  BP: 143/91  Pulse: 73  Temp: 98.8 F (37.1 C)  Resp: 18   Filed Weights   05/01/15 0851  Weight: 184 lb 8 oz (83.689 kg)    GENERAL:alert, no distress and comfortable SKIN: skin color, texture, turgor are normal, no rashes or significant lesions EYES: normal, Conjunctiva are pink and non-injected, sclera clear OROPHARYNX:no exudate, no erythema and lips, buccal mucosa, and tongue normal  NECK: supple, thyroid normal size, non-tender, without nodularity LYMPH:  no palpable lymphadenopathy in the cervical, axillary or inguinal LUNGS: clear to auscultation and percussion with normal breathing effort HEART: regular rate & rhythm and no murmurs and no lower extremity edema ABDOMEN:abdomen soft, non-tender and normal bowel sounds Musculoskeletal:no cyanosis of digits and no clubbing  NEURO: alert & oriented x 3 with fluent speech, no focal motor/sensory deficits  LABORATORY DATA:  I have reviewed the data as listed   Chemistry      Component Value Date/Time   NA 143 04/24/2015 0839   NA 139 02/14/2015 1547   K 3.9 04/24/2015 0839   K 3.5 02/14/2015 1547   CL 104 02/14/2015 1547   CO2 29 04/24/2015 0839   CO2 24 02/14/2015 1547   BUN 8.3 04/24/2015 0839   BUN 5* 02/14/2015 1547   CREATININE 0.7 04/24/2015 0839   CREATININE 0.65 02/14/2015 1547      Component Value Date/Time   CALCIUM 9.1 04/24/2015 0839   CALCIUM 9.3 02/14/2015 1547   ALKPHOS 59 04/24/2015 0839    ALKPHOS 61 02/14/2015 1547   AST 23 04/24/2015 0839   AST 15 02/14/2015 1547   ALT 32 04/24/2015 0839   ALT 12* 02/14/2015 1547   BILITOT 0.28 04/24/2015 0839   BILITOT 0.7 02/14/2015 1547       Lab Results  Component Value Date   WBC 5.4 05/01/2015   HGB 10.7* 05/01/2015   HCT 32.0* 05/01/2015   MCV 86.7 05/01/2015   PLT 322 05/01/2015   NEUTROABS 3.9 05/01/2015   ASSESSMENT & PLAN:  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 Patient enrolled  in PREVENT clinical trial: Lipitor vs Placebo (no toxicities from this study)  Current treatment: Neo-adjuvant chemotherapy completed Dose dense Adriamycin and Cytoxan 4; today is cycle 3/12 of weekly Abraxane  Chemo toxicities: 1. Constipation 2. Heartburn 3. Grade 1 nausea related to constipation 4. Grade 1 anemia: With a history of sickle cell trait. We will monitor this 5. Left groin infection with pus to that burst open after cycle 2 of AC: Received Diflucan for 7 days with Keflex for 7 days. Resolved. 6. Alopecia 7. Rash on the face which is itching: Recommended cortisone cream 8. Depression: Because she is still struggling with why she got this cancer.  Blood counts reviewed and they're adequate for treatment Patient will be taking a vacation next week and we will be holding chemotherapy. She will resume the week after. RTC in 3 weeks for follow-ups and in 2 weeks for Abraxane..  No orders of the defined types were  placed in this encounter.   The patient has a good understanding of the overall plan. she agrees with it. she will call with any problems that may develop before the next visit here.   Rulon Eisenmenger, MD

## 2015-05-02 ENCOUNTER — Telehealth: Payer: Self-pay | Admitting: Hematology and Oncology

## 2015-05-02 NOTE — Telephone Encounter (Signed)
Left message to confirm Chemo has been canceled per pof also added in MD on 8/8 patient can pick up new schedule at lab only appointment 07/25

## 2015-05-08 ENCOUNTER — Ambulatory Visit: Payer: 59

## 2015-05-08 ENCOUNTER — Other Ambulatory Visit: Payer: 59

## 2015-05-15 ENCOUNTER — Ambulatory Visit (HOSPITAL_BASED_OUTPATIENT_CLINIC_OR_DEPARTMENT_OTHER): Payer: 59

## 2015-05-15 ENCOUNTER — Encounter: Payer: Self-pay | Admitting: *Deleted

## 2015-05-15 ENCOUNTER — Other Ambulatory Visit (HOSPITAL_BASED_OUTPATIENT_CLINIC_OR_DEPARTMENT_OTHER): Payer: 59

## 2015-05-15 VITALS — BP 128/84 | HR 76 | Temp 98.2°F | Resp 18

## 2015-05-15 DIAGNOSIS — C50411 Malignant neoplasm of upper-outer quadrant of right female breast: Secondary | ICD-10-CM

## 2015-05-15 DIAGNOSIS — Z5111 Encounter for antineoplastic chemotherapy: Secondary | ICD-10-CM | POA: Diagnosis not present

## 2015-05-15 LAB — COMPREHENSIVE METABOLIC PANEL (CC13)
ALBUMIN: 3.4 g/dL — AB (ref 3.5–5.0)
ALT: 28 U/L (ref 0–55)
ANION GAP: 6 meq/L (ref 3–11)
AST: 19 U/L (ref 5–34)
Alkaline Phosphatase: 68 U/L (ref 40–150)
BILIRUBIN TOTAL: 0.25 mg/dL (ref 0.20–1.20)
BUN: 6.5 mg/dL — AB (ref 7.0–26.0)
CALCIUM: 9.2 mg/dL (ref 8.4–10.4)
CO2: 27 meq/L (ref 22–29)
Chloride: 108 mEq/L (ref 98–109)
Creatinine: 0.7 mg/dL (ref 0.6–1.1)
Glucose: 97 mg/dl (ref 70–140)
Potassium: 4 mEq/L (ref 3.5–5.1)
SODIUM: 141 meq/L (ref 136–145)
TOTAL PROTEIN: 6.9 g/dL (ref 6.4–8.3)

## 2015-05-15 LAB — CBC WITH DIFFERENTIAL/PLATELET
BASO%: 1.2 % (ref 0.0–2.0)
Basophils Absolute: 0.1 10*3/uL (ref 0.0–0.1)
EOS%: 1.9 % (ref 0.0–7.0)
Eosinophils Absolute: 0.1 10*3/uL (ref 0.0–0.5)
HEMATOCRIT: 32.4 % — AB (ref 34.8–46.6)
HEMOGLOBIN: 10.9 g/dL — AB (ref 11.6–15.9)
LYMPH%: 12.9 % — AB (ref 14.0–49.7)
MCH: 29.3 pg (ref 25.1–34.0)
MCHC: 33.6 g/dL (ref 31.5–36.0)
MCV: 87.2 fL (ref 79.5–101.0)
MONO#: 0.8 10*3/uL (ref 0.1–0.9)
MONO%: 12.6 % (ref 0.0–14.0)
NEUT#: 4.5 10*3/uL (ref 1.5–6.5)
NEUT%: 71.4 % (ref 38.4–76.8)
Platelets: 260 10*3/uL (ref 145–400)
RBC: 3.72 10*6/uL (ref 3.70–5.45)
RDW: 18.1 % — ABNORMAL HIGH (ref 11.2–14.5)
WBC: 6.3 10*3/uL (ref 3.9–10.3)
lymph#: 0.8 10*3/uL — ABNORMAL LOW (ref 0.9–3.3)

## 2015-05-15 MED ORDER — SODIUM CHLORIDE 0.9 % IJ SOLN
10.0000 mL | INTRAMUSCULAR | Status: DC | PRN
  Administered 2015-05-15: 10 mL
  Filled 2015-05-15: qty 10

## 2015-05-15 MED ORDER — HEPARIN SOD (PORK) LOCK FLUSH 100 UNIT/ML IV SOLN
500.0000 [IU] | Freq: Once | INTRAVENOUS | Status: AC | PRN
  Administered 2015-05-15: 500 [IU]
  Filled 2015-05-15: qty 5

## 2015-05-15 MED ORDER — SODIUM CHLORIDE 0.9 % IV SOLN
Freq: Once | INTRAVENOUS | Status: AC
  Administered 2015-05-15: 10:00:00 via INTRAVENOUS
  Filled 2015-05-15: qty 4

## 2015-05-15 MED ORDER — SODIUM CHLORIDE 0.9 % IV SOLN
Freq: Once | INTRAVENOUS | Status: AC
  Administered 2015-05-15: 09:00:00 via INTRAVENOUS

## 2015-05-15 MED ORDER — PACLITAXEL PROTEIN-BOUND CHEMO INJECTION 100 MG
80.0000 mg/m2 | Freq: Once | INTRAVENOUS | Status: AC
  Administered 2015-05-15: 150 mg via INTRAVENOUS
  Filled 2015-05-15: qty 30

## 2015-05-15 NOTE — Patient Instructions (Signed)
[facility] Discharge Instructions for Patients Receiving Chemotherapy  Today you received the following chemotherapy agents; Abraxene.   To help prevent nausea and vomiting after your treatment, we encourage you to take your nausea medication as directed.    If you develop nausea and vomiting that is not controlled by your nausea medication, call the clinic.   BELOW ARE SYMPTOMS THAT SHOULD BE REPORTED IMMEDIATELY:  *FEVER GREATER THAN 100.5 F  *CHILLS WITH OR WITHOUT FEVER  NAUSEA AND VOMITING THAT IS NOT CONTROLLED WITH YOUR NAUSEA MEDICATION  *UNUSUAL SHORTNESS OF BREATH  *UNUSUAL BRUISING OR BLEEDING  TENDERNESS IN MOUTH AND THROAT WITH OR WITHOUT PRESENCE OF ULCERS  *URINARY PROBLEMS  *BOWEL PROBLEMS  UNUSUAL RASH Items with * indicate a potential emergency and should be followed up as soon as possible.  Feel free to call the clinic you have any questions or concerns. The clinic phone number is [phone].  Please show the CHEMO ALERT CARD at check-in to the Emergency Department and triage nurse.   

## 2015-05-22 ENCOUNTER — Ambulatory Visit (HOSPITAL_BASED_OUTPATIENT_CLINIC_OR_DEPARTMENT_OTHER): Payer: 59

## 2015-05-22 ENCOUNTER — Other Ambulatory Visit: Payer: 59

## 2015-05-22 ENCOUNTER — Other Ambulatory Visit: Payer: Self-pay

## 2015-05-22 ENCOUNTER — Ambulatory Visit: Payer: 59 | Admitting: Hematology and Oncology

## 2015-05-22 ENCOUNTER — Ambulatory Visit (HOSPITAL_BASED_OUTPATIENT_CLINIC_OR_DEPARTMENT_OTHER): Payer: 59 | Admitting: Hematology and Oncology

## 2015-05-22 ENCOUNTER — Telehealth: Payer: Self-pay | Admitting: Hematology and Oncology

## 2015-05-22 ENCOUNTER — Ambulatory Visit: Payer: 59

## 2015-05-22 ENCOUNTER — Encounter: Payer: Self-pay | Admitting: Hematology and Oncology

## 2015-05-22 VITALS — BP 137/73 | HR 78 | Temp 98.5°F | Resp 18 | Wt 186.5 lb

## 2015-05-22 DIAGNOSIS — C50411 Malignant neoplasm of upper-outer quadrant of right female breast: Secondary | ICD-10-CM | POA: Diagnosis not present

## 2015-05-22 DIAGNOSIS — Z171 Estrogen receptor negative status [ER-]: Secondary | ICD-10-CM | POA: Diagnosis not present

## 2015-05-22 DIAGNOSIS — Z5111 Encounter for antineoplastic chemotherapy: Secondary | ICD-10-CM | POA: Diagnosis not present

## 2015-05-22 DIAGNOSIS — C773 Secondary and unspecified malignant neoplasm of axilla and upper limb lymph nodes: Secondary | ICD-10-CM

## 2015-05-22 DIAGNOSIS — R935 Abnormal findings on diagnostic imaging of other abdominal regions, including retroperitoneum: Secondary | ICD-10-CM

## 2015-05-22 LAB — CBC WITH DIFFERENTIAL/PLATELET
BASO%: 0.7 % (ref 0.0–2.0)
Basophils Absolute: 0 10*3/uL (ref 0.0–0.1)
EOS%: 1.5 % (ref 0.0–7.0)
Eosinophils Absolute: 0.1 10*3/uL (ref 0.0–0.5)
HCT: 31.5 % — ABNORMAL LOW (ref 34.8–46.6)
HGB: 10.7 g/dL — ABNORMAL LOW (ref 11.6–15.9)
LYMPH#: 1.2 10*3/uL (ref 0.9–3.3)
LYMPH%: 19.3 % (ref 14.0–49.7)
MCH: 29.1 pg (ref 25.1–34.0)
MCHC: 34 g/dL (ref 31.5–36.0)
MCV: 85.6 fL (ref 79.5–101.0)
MONO#: 0.4 10*3/uL (ref 0.1–0.9)
MONO%: 6.4 % (ref 0.0–14.0)
NEUT%: 72.1 % (ref 38.4–76.8)
NEUTROS ABS: 4.3 10*3/uL (ref 1.5–6.5)
Platelets: 279 10*3/uL (ref 145–400)
RBC: 3.68 10*6/uL — ABNORMAL LOW (ref 3.70–5.45)
RDW: 15.6 % — ABNORMAL HIGH (ref 11.2–14.5)
WBC: 6 10*3/uL (ref 3.9–10.3)

## 2015-05-22 LAB — COMPREHENSIVE METABOLIC PANEL (CC13)
ALK PHOS: 74 U/L (ref 40–150)
ALT: 25 U/L (ref 0–55)
AST: 19 U/L (ref 5–34)
Albumin: 3.5 g/dL (ref 3.5–5.0)
Anion Gap: 8 mEq/L (ref 3–11)
BILIRUBIN TOTAL: 0.3 mg/dL (ref 0.20–1.20)
BUN: 7.2 mg/dL (ref 7.0–26.0)
CO2: 28 meq/L (ref 22–29)
Calcium: 8.9 mg/dL (ref 8.4–10.4)
Chloride: 109 mEq/L (ref 98–109)
Creatinine: 0.7 mg/dL (ref 0.6–1.1)
EGFR: >90 mL/min/{1.73_m2} (ref >90)
Glucose: 101 mg/dl (ref 70–140)
Potassium: 3.5 mEq/L (ref 3.5–5.1)
SODIUM: 145 meq/L (ref 136–145)
Total Protein: 7 g/dL (ref 6.4–8.3)

## 2015-05-22 MED ORDER — SODIUM CHLORIDE 0.9 % IV SOLN
Freq: Once | INTRAVENOUS | Status: AC
  Administered 2015-05-22: 15:00:00 via INTRAVENOUS

## 2015-05-22 MED ORDER — PACLITAXEL PROTEIN-BOUND CHEMO INJECTION 100 MG
80.0000 mg/m2 | Freq: Once | INTRAVENOUS | Status: AC
  Administered 2015-05-22: 150 mg via INTRAVENOUS
  Filled 2015-05-22: qty 30

## 2015-05-22 MED ORDER — SODIUM CHLORIDE 0.9 % IV SOLN
Freq: Once | INTRAVENOUS | Status: AC
  Administered 2015-05-22: 15:00:00 via INTRAVENOUS
  Filled 2015-05-22: qty 4

## 2015-05-22 MED ORDER — HEPARIN SOD (PORK) LOCK FLUSH 100 UNIT/ML IV SOLN
500.0000 [IU] | Freq: Once | INTRAVENOUS | Status: AC | PRN
  Administered 2015-05-22: 500 [IU]
  Filled 2015-05-22: qty 5

## 2015-05-22 MED ORDER — SODIUM CHLORIDE 0.9 % IJ SOLN
10.0000 mL | INTRAMUSCULAR | Status: DC | PRN
  Administered 2015-05-22: 10 mL
  Filled 2015-05-22: qty 10

## 2015-05-22 NOTE — Progress Notes (Signed)
Patient Care Team: [name], MD as PCP - General (Family Medicine) Chauncey Cruel, MD as Consulting Physician (Oncology) Thea Silversmith, MD as Consulting Physician (Radiation Oncology) Mauro Kaufmann, RN as Registered Nurse Rockwell Germany, RN as Registered Nurse Alphonsa Overall, MD as Consulting Physician (General Surgery)  DIAGNOSIS: Breast cancer of upper-outer quadrant of right female breast   Staging form: Breast, AJCC 7th Edition     Clinical stage from 02/08/2015: Stage IIIA (T3, N1, M0) - Unsigned   SUMMARY OF ONCOLOGIC HISTORY:   Breast cancer of upper-outer quadrant of right female breast   01/27/2015 Initial Diagnosis Right breast 9:00 and 10:00 biopsy: Grade 3 IDC ER/PR negative, HER-2 negative, Ki-67 77% and 73%; the right axillary lymph node biopsy: EF 22% positive   01/30/2015 Breast MRI Right breast masses 9 and 10:00 position, additional irregular masses with non-mass enhancement upper outer quadrant with one mass extending into upper inner quadrant, multifocal, multicentric 13.7 x 4.6 x 4.6 cm, malignant right axillary lymph node   02/15/2015 Imaging No evidence of metastatic disease, 2.2 cm enhancing lesion anterior left lower kidney suspicious for solid renal neoplasm   02/20/2015 -  Neo-Adjuvant Chemotherapy Adriamycin and cytoxan dose dense Q 2 weeks X 4 foll by Abraxane weekly x 12    CHIEF COMPLIANT: Cycle 5 of Abraxane  INTERVAL HISTORY: Phyllis Pruitt is a 44 year old with above-mentioned history of right breast cancer currently on new adjuvant chemotherapy. She had a week break from treatment and she is back here for continuation of Abraxane. She feels recharged and is ready to continue with the treatment. Denies any fevers or chills.   REVIEW OF SYSTEMS:   Constitutional: Denies fevers, chills or abnormal weight loss Eyes: Denies blurriness of vision Ears, nose, mouth, throat, and face: Denies mucositis or sore throat Respiratory: Denies cough, dyspnea or  wheezes Cardiovascular: Denies palpitation, chest discomfort or lower extremity swelling Gastrointestinal:  Denies nausea, heartburn or change in bowel habits Skin: Denies abnormal skin rashes Lymphatics: Denies new lymphadenopathy or easy bruising Neurological:Denies numbness, tingling or new weaknesses Behavioral/Psych: Mood is stable, no new changes  Breast:  denies any pain or lumps or nodules in either breasts All other systems were reviewed with the patient and are negative.  I have reviewed the past medical history, past surgical history, social history and family history with the patient and they are unchanged from previous note.  ALLERGIES:  has No Known Allergies.  MEDICATIONS:  Current Outpatient Prescriptions  Medication Sig Dispense Refill  . Atorvastatin Calcium (INVESTIGATIONAL ATORVASTATIN/PLACEBO) 40 MG tablet [facility] [id number] Take 1 tablet by mouth daily. Take 2 doses (these doses must be 12 hours apart) prior to first chemotherapy treatment. Then take 1 tablet daily by mouth with or without food. 180 tablet 0  . dexamethasone (DECADRON) 4 MG tablet TAKE 1 TABLET 1X A DAY ON THE DAY AFTER CHEMOTHERAPY& THEN 1 TABLET 2X A DAY X 2 DAYS W/ FOOD  1  . diphenhydramine-acetaminophen (TYLENOL PM) 25-500 MG TABS Take 1 tablet by mouth at bedtime as needed (sleep).     Marland Kitchen ECHINACEA PO Take 1 tablet by mouth daily.     . fluconazole (DIFLUCAN) 100 MG tablet Take 100 mg by mouth daily.  0  . Ginger, Zingiber officinalis, (GINGER ROOT PO) Take 1 tablet by mouth daily.     Marland Kitchen HYDROcodone-acetaminophen (NORCO/VICODIN) 5-325 MG per tablet     . LORazepam (ATIVAN) 0.5 MG tablet Take 1 tablet (0.5 mg total)  by mouth at bedtime. 30 tablet 0  . Multiple Vitamin (MULTIVITAMIN WITH MINERALS) TABS tablet Take 1 tablet by mouth daily.    . ondansetron (ZOFRAN) 8 MG tablet TAKE 1 TABLET TWICE A DAY AS NEEDED , START ON THE THIRD DAY AFTER CHEMOTHERAPY  1  . prochlorperazine (COMPAZINE) 10 MG  tablet TAKE 1 TABLET (10 MG TOTAL) BY MOUTH EVERY 6 (SIX) HOURS AS NEEDED (NAUSEA OR VOMITING).  1  . TURMERIC CURCUMIN PO Take 1 capsule by mouth daily.      No current facility-administered medications for this visit.    PHYSICAL EXAMINATION: ECOG PERFORMANCE STATUS: 1 - Symptomatic but completely ambulatory  There were no vitals filed for this visit. There were no vitals filed for this visit.  GENERAL:alert, no distress and comfortable SKIN: skin color, texture, turgor are normal, no rashes or significant lesions EYES: normal, Conjunctiva are pink and non-injected, sclera clear OROPHARYNX:no exudate, no erythema and lips, buccal mucosa, and tongue normal  NECK: supple, thyroid normal size, non-tender, without nodularity LYMPH:  no palpable lymphadenopathy in the cervical, axillary or inguinal LUNGS: clear to auscultation and percussion with normal breathing effort HEART: regular rate & rhythm and no murmurs and no lower extremity edema ABDOMEN:abdomen soft, non-tender and normal bowel sounds Musculoskeletal:no cyanosis of digits and no clubbing  NEURO: alert & oriented x 3 with fluent speech, no focal motor/sensory deficits  LABORATORY DATA:  I have reviewed the data as listed   Chemistry      Component Value Date/Time   NA 141 05/15/2015 0827   NA 139 02/14/2015 1547   K 4.0 05/15/2015 0827   K 3.5 02/14/2015 1547   CL 104 02/14/2015 1547   CO2 27 05/15/2015 0827   CO2 24 02/14/2015 1547   BUN 6.5* 05/15/2015 0827   BUN 5* 02/14/2015 1547   CREATININE 0.7 05/15/2015 0827   CREATININE 0.65 02/14/2015 1547      Component Value Date/Time   CALCIUM 9.2 05/15/2015 0827   CALCIUM 9.3 02/14/2015 1547   ALKPHOS 68 05/15/2015 0827   ALKPHOS 61 02/14/2015 1547   AST 19 05/15/2015 0827   AST 15 02/14/2015 1547   ALT 28 05/15/2015 0827   ALT 12* 02/14/2015 1547   BILITOT 0.25 05/15/2015 0827   BILITOT 0.7 02/14/2015 1547       Lab Results  Component Value Date   WBC  6.0 05/22/2015   HGB 10.7* 05/22/2015   HCT 31.5* 05/22/2015   MCV 85.6 05/22/2015   PLT 279 05/22/2015   NEUTROABS 4.3 05/22/2015   ASSESSMENT & PLAN:  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 Patient enrolled in PREVENT clinical trial: Lipitor vs Placebo (no toxicities from this study)  Current treatment: Neo-adjuvant chemotherapy completed Dose dense Adriamycin and Cytoxan 4; today is cycle 5/12 of weekly Abraxane  Chemo toxicities: 1. Constipation 2. Heartburn 3. Grade 1 nausea related to constipation 4. Grade 1 anemia: With a history of sickle cell trait. We  will monitor this 5. Left groin infection with pus to that burst open after cycle 2 of AC: Received Diflucan for 7 days with Keflex for 7 days. Resolved. 6. Alopecia 7. Rash on the face which is itching: Resolved with cortisone cream 8. Depression: Because she is still struggling with why she got this cancer.  Blood counts reviewed and they're adequate for treatment Patient took a vacation next week and we held chemotherapy. She will resume today. RTC in 2 weeks for follow-ups and in weekly for Abraxane..  No orders of the defined types were placed in this encounter.   The patient has a good understanding of the overall plan. she agrees with it. she will call with any problems that may develop before the next visit here.   Rulon Eisenmenger, MD

## 2015-05-22 NOTE — Patient Instructions (Signed)
[facility] Discharge Instructions for Patients Receiving Chemotherapy  Today you received the following chemotherapy agents: Abraxane   To help prevent nausea and vomiting after your treatment, we encourage you to take your nausea medication as directed.    If you develop nausea and vomiting that is not controlled by your nausea medication, call the clinic.   BELOW ARE SYMPTOMS THAT SHOULD BE REPORTED IMMEDIATELY:  *FEVER GREATER THAN 100.5 F  *CHILLS WITH OR WITHOUT FEVER  NAUSEA AND VOMITING THAT IS NOT CONTROLLED WITH YOUR NAUSEA MEDICATION  *UNUSUAL SHORTNESS OF BREATH  *UNUSUAL BRUISING OR BLEEDING  TENDERNESS IN MOUTH AND THROAT WITH OR WITHOUT PRESENCE OF ULCERS  *URINARY PROBLEMS  *BOWEL PROBLEMS  UNUSUAL RASH Items with * indicate a potential emergency and should be followed up as soon as possible.  Feel free to call the clinic you have any questions or concerns. The clinic phone number is [phone].  Please show the CHEMO ALERT CARD at check-in to the Emergency Department and triage nurse.   

## 2015-05-22 NOTE — Assessment & Plan Note (Signed)
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 Patient enrolled in PREVENT clinical trial: Lipitor vs Placebo (no toxicities from this study)  Current treatment: Neo-adjuvant chemotherapy completed Dose dense Adriamycin and Cytoxan 4; today is cycle 5/12 of weekly Abraxane  Chemo toxicities: 1. Constipation 2. Heartburn 3. Grade 1 nausea related to constipation 4. Grade 1 anemia: With a history of sickle cell trait. We will monitor this 5. Left groin infection with pus to that burst open after cycle 2 of AC: Received Diflucan for 7 days with Keflex for 7 days. Resolved. 6. Alopecia 7. Rash on the face which is itching: Resolved with cortisone cream 8. Depression: Because she is still struggling with why she got this cancer.  Blood counts reviewed and they're adequate for treatment Patient took a vacation next week and we held chemotherapy. She will resume today. RTC in 2 weeks for follow-ups and in weekly for Abraxane.Marland Kitchen

## 2015-05-22 NOTE — Assessment & Plan Note (Signed)
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 Patient enrolled in PREVENT clinical trial: Lipitor vs Placebo (no toxicities from this study)  Current treatment: Neo-adjuvant chemotherapy completed Dose dense Adriamycin and Cytoxan 4; today is cycle 5/12 of weekly Abraxane  Chemo toxicities: 1. Constipation 2. Heartburn 3. Grade 1 nausea related to constipation 4. Grade 1 anemia: With a history of sickle cell trait. We will monitor this 5. Left groin infection with pus to that burst open after cycle 2 of AC: Received Diflucan for 7 days with Keflex for 7 days. Resolved. 6. Alopecia 7. Rash on the face which is itching: Resolved with cortisone cream 8. Depression: Because she is still struggling with why she got this cancer.  Blood counts reviewed and they're adequate for treatment Patient took a vacation next week and we held chemotherapy. She will resume today. RTC in 2 weeks for follow-ups and in weekly for Abraxane.Phyllis Pruitt

## 2015-05-22 NOTE — Telephone Encounter (Signed)
Appointments added per pof  anne °

## 2015-05-22 NOTE — Progress Notes (Signed)
Pt called and LM 8/5 at 415 pm about today's appt.  Returned pt call.  She did not come this morning due/to insurance concerns.  She has since learned she will have coverage.  Scheduled for 115 for labs, work in [place], chemo at 230 pm.  Pt voiced understanding.  pof sent

## 2015-05-22 NOTE — Telephone Encounter (Signed)
Appointments made and avs printed for patient °

## 2015-05-25 ENCOUNTER — Other Ambulatory Visit: Payer: Self-pay | Admitting: *Deleted

## 2015-05-25 ENCOUNTER — Telehealth: Payer: Self-pay | Admitting: *Deleted

## 2015-05-25 NOTE — Telephone Encounter (Signed)
Spoke with patient. She will come in 2 8:15 am. POF sent

## 2015-05-25 NOTE — Telephone Encounter (Signed)
TC from patient stating that she has developed a lump/cyst type lesion on her genital area that is now the size of a grape and developing a white 'head' on it and is very tender to the touch. She states it appears as if it may rupture at any time. She saw Dr. [name] on 05/22/15 and this area was not an issue then. Please advise.

## 2015-05-26 ENCOUNTER — Encounter: Payer: Self-pay | Admitting: Hematology and Oncology

## 2015-05-26 ENCOUNTER — Telehealth: Payer: Self-pay

## 2015-05-26 ENCOUNTER — Ambulatory Visit (HOSPITAL_BASED_OUTPATIENT_CLINIC_OR_DEPARTMENT_OTHER): Payer: 59 | Admitting: Hematology and Oncology

## 2015-05-26 VITALS — BP 142/81 | HR 84 | Temp 98.7°F | Resp 18 | Ht 65.5 in | Wt 187.9 lb

## 2015-05-26 DIAGNOSIS — C773 Secondary and unspecified malignant neoplasm of axilla and upper limb lymph nodes: Secondary | ICD-10-CM

## 2015-05-26 DIAGNOSIS — D6481 Anemia due to antineoplastic chemotherapy: Secondary | ICD-10-CM

## 2015-05-26 DIAGNOSIS — Z171 Estrogen receptor negative status [ER-]: Secondary | ICD-10-CM

## 2015-05-26 DIAGNOSIS — N764 Abscess of vulva: Secondary | ICD-10-CM

## 2015-05-26 DIAGNOSIS — C50411 Malignant neoplasm of upper-outer quadrant of right female breast: Secondary | ICD-10-CM

## 2015-05-26 MED ORDER — CEPHALEXIN 500 MG PO CAPS: 500.0000 mg | ORAL_CAPSULE | Freq: Two times a day (BID) | ORAL | Status: DC

## 2015-05-26 NOTE — Telephone Encounter (Signed)
Referral rcvd from [place] at [facility].  Original sent to scan.  Copy to HIM

## 2015-05-26 NOTE — Assessment & Plan Note (Signed)
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 Patient enrolled in PREVENT clinical trial: Lipitor vs Placebo (no toxicities from this study)  Current treatment: Neo-adjuvant chemotherapy completed Dose dense Adriamycin and Cytoxan 4; today is cycle 6/12 of weekly Abraxane  Chemo toxicities: 1. Constipation 2. Heartburn 3. Grade 1 nausea related to constipation 4. Grade 1 anemia: With a history of sickle cell trait. We will monitor this 5. Left groin infection with pus to that burst open after cycle 2 of AC: Received Diflucan for 7 days with Keflex for 7 days. Resolved. 6. Alopecia 7. Rash on the face which is itching: Resolved with cortisone cream 8. Depression: Because she is still struggling with why she got this cancer.  Blood counts reviewed and they're adequate for treatment Patient took a vacation next week and we held chemotherapy. She will resume today. RTC in 2 weeks for follow-ups and in weekly for Abraxane.

## 2015-05-26 NOTE — Progress Notes (Signed)
Patient Care Team: [name], MD as PCP - General (Family Medicine) Chauncey Cruel, MD as Consulting Physician (Oncology) Thea Silversmith, MD as Consulting Physician (Radiation Oncology) Mauro Kaufmann, RN as Registered Nurse Rockwell Germany, RN as Registered Nurse Alphonsa Overall, MD as Consulting Physician (General Surgery)  DIAGNOSIS: Breast cancer of upper-outer quadrant of right female breast   Staging form: Breast, AJCC 7th Edition     Clinical stage from 02/08/2015: Stage IIIA (T3, N1, M0) - Unsigned   SUMMARY OF ONCOLOGIC HISTORY:   Breast cancer of upper-outer quadrant of right female breast   01/27/2015 Initial Diagnosis Right breast 9:00 and 10:00 biopsy: Grade 3 IDC ER/PR negative, HER-2 negative, Ki-67 77% and 73%; the right axillary lymph node biopsy: EF 22% positive   01/30/2015 Breast MRI Right breast masses 9 and 10:00 position, additional irregular masses with non-mass enhancement upper outer quadrant with one mass extending into upper inner quadrant, multifocal, multicentric 13.7 x 4.6 x 4.6 cm, malignant right axillary lymph node   02/15/2015 Imaging No evidence of metastatic disease, 2.2 cm enhancing lesion anterior left lower kidney suspicious for solid renal neoplasm   02/20/2015 -  Neo-Adjuvant Chemotherapy Adriamycin and cytoxan dose dense Q 2 weeks X 4 foll by Abraxane weekly x 12    CHIEF COMPLIANT: Right vulvar abscess  INTERVAL HISTORY: Phyllis Pruitt is a 44 year old who came in for an urgent visit because of new onset of right vulvar abscess. This is started 2-3 days ago and is bothering her and is getting painful. Previously she had a right groin access abscess which was treated with Keflex and that resolved. She denies any fevers or chills.  REVIEW OF SYSTEMS:   Constitutional: Denies fevers, chills or abnormal weight loss Eyes: Denies blurriness of vision Ears, nose, mouth, throat, and face: Denies mucositis or sore throat Respiratory: Denies cough,  dyspnea or wheezes Cardiovascular: Denies palpitation, chest discomfort or lower extremity swelling Gastrointestinal:  Denies nausea, heartburn or change in bowel habits Skin: Right vulvar abscess Lymphatics: Denies new lymphadenopathy or easy bruising Neurological:Denies numbness, tingling or new weaknesses Behavioral/Psych: Mood is stable, no new changes   All other systems were reviewed with the patient and are negative.  I have reviewed the past medical history, past surgical history, social history and family history with the patient and they are unchanged from previous note.  ALLERGIES:  has No Known Allergies.  MEDICATIONS:  Current Outpatient Prescriptions  Medication Sig Dispense Refill  . Atorvastatin Calcium (INVESTIGATIONAL ATORVASTATIN/PLACEBO) 40 MG tablet [facility] [id number] Take 1 tablet by mouth daily. Take 2 doses (these doses must be 12 hours apart) prior to first chemotherapy treatment. Then take 1 tablet daily by mouth with or without food. 180 tablet 0  . dexamethasone (DECADRON) 4 MG tablet TAKE 1 TABLET 1X A DAY ON THE DAY AFTER CHEMOTHERAPY& THEN 1 TABLET 2X A DAY X 2 DAYS W/ FOOD  1  . diphenhydramine-acetaminophen (TYLENOL PM) 25-500 MG TABS Take 1 tablet by mouth at bedtime as needed (sleep).     Marland Kitchen ECHINACEA PO Take 1 tablet by mouth daily.     . fluconazole (DIFLUCAN) 100 MG tablet Take 100 mg by mouth daily.  0  . Ginger, Zingiber officinalis, (GINGER ROOT PO) Take 1 tablet by mouth daily.     Marland Kitchen HYDROcodone-acetaminophen (NORCO/VICODIN) 5-325 MG per tablet     . LORazepam (ATIVAN) 0.5 MG tablet Take 1 tablet (0.5 mg total) by mouth at bedtime. 30 tablet 0  .  Multiple Vitamin (MULTIVITAMIN WITH MINERALS) TABS tablet Take 1 tablet by mouth daily.    . ondansetron (ZOFRAN) 8 MG tablet TAKE 1 TABLET TWICE A DAY AS NEEDED , START ON THE THIRD DAY AFTER CHEMOTHERAPY  1  . prochlorperazine (COMPAZINE) 10 MG tablet TAKE 1 TABLET (10 MG TOTAL) BY MOUTH EVERY 6 (SIX)  HOURS AS NEEDED (NAUSEA OR VOMITING).  1  . TURMERIC CURCUMIN PO Take 1 capsule by mouth daily.      No current facility-administered medications for this visit.    PHYSICAL EXAMINATION: ECOG PERFORMANCE STATUS: 1 - Symptomatic but completely ambulatory  There were no vitals filed for this visit. There were no vitals filed for this visit.  GENERAL:alert, no distress and comfortable SKIN: skin color, texture, turgor are normal, no rashes or significant lesions EYES: normal, Conjunctiva are pink and non-injected, sclera clear OROPHARYNX:no exudate, no erythema and lips, buccal mucosa, and tongue normal  NECK: supple, thyroid normal size, non-tender, without nodularity LYMPH:  no palpable lymphadenopathy in the cervical, axillary or inguinal LUNGS: clear to auscultation and percussion with normal breathing effort HEART: regular rate & rhythm and no murmurs and no lower extremity edema ABDOMEN:abdomen soft, non-tender and normal bowel sounds GYN: External examination of the vulvar area revealed a 2 cm nodule that is tender to palpation most likely an abscess Musculoskeletal:no cyanosis of digits and no clubbing  NEURO: alert & oriented x 3 with fluent speech, no focal motor/sensory deficits   LABORATORY DATA:  I have reviewed the data as listed   Chemistry      Component Value Date/Time   NA 145 05/22/2015 1323   NA 139 02/14/2015 1547   K 3.5 05/22/2015 1323   K 3.5 02/14/2015 1547   CL 104 02/14/2015 1547   CO2 28 05/22/2015 1323   CO2 24 02/14/2015 1547   BUN 7.2 05/22/2015 1323   BUN 5* 02/14/2015 1547   CREATININE 0.7 05/22/2015 1323   CREATININE 0.65 02/14/2015 1547      Component Value Date/Time   CALCIUM 8.9 05/22/2015 1323   CALCIUM 9.3 02/14/2015 1547   ALKPHOS 74 05/22/2015 1323   ALKPHOS 61 02/14/2015 1547   AST 19 05/22/2015 1323   AST 15 02/14/2015 1547   ALT 25 05/22/2015 1323   ALT 12* 02/14/2015 1547   BILITOT 0.30 05/22/2015 1323   BILITOT 0.7  02/14/2015 1547       Lab Results  Component Value Date   WBC 6.0 05/22/2015   HGB 10.7* 05/22/2015   HCT 31.5* 05/22/2015   MCV 85.6 05/22/2015   PLT 279 05/22/2015   NEUTROABS 4.3 05/22/2015   ASSESSMENT & PLAN:  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 Patient enrolled in PREVENT clinical trial: Lipitor vs Placebo (no toxicities from this study)  Current treatment: Neo-adjuvant chemotherapy completed Dose dense Adriamycin and Cytoxan 4; next week will be cycle 6/12 of weekly Abraxane  Chemo toxicities: 1. Constipation 2. Heartburn 3. Grade 1 nausea  related to constipation 4. Grade 1 anemia: With a history of sickle cell trait. We will monitor this 5. Left groin infection with pus to that burst open after cycle 2 of AC: Received Diflucan for 7 days with Keflex for 7 days. Resolved. 6. Alopecia 7. Rash on the face which is itching: Resolved with cortisone cream 8. Depression: Because she is still struggling with why she got this cancer. 9. Right vulvar abscess: Prescribed Keflex 500 twice a day for 10 days 05/15/2015  Patient is due for cycle 6 next week and all of her appointments have been previously scheduled.  No orders of the defined types were placed in this encounter.   The patient has a good understanding of the overall plan. she agrees with it. she will call with any problems that may develop before the next visit here.   Rulon Eisenmenger, MD

## 2015-05-29 ENCOUNTER — Other Ambulatory Visit (HOSPITAL_BASED_OUTPATIENT_CLINIC_OR_DEPARTMENT_OTHER): Payer: 59

## 2015-05-29 ENCOUNTER — Ambulatory Visit (HOSPITAL_BASED_OUTPATIENT_CLINIC_OR_DEPARTMENT_OTHER): Payer: 59

## 2015-05-29 VITALS — BP 124/78 | HR 76 | Temp 98.6°F | Resp 18

## 2015-05-29 DIAGNOSIS — D6481 Anemia due to antineoplastic chemotherapy: Secondary | ICD-10-CM

## 2015-05-29 DIAGNOSIS — Z5111 Encounter for antineoplastic chemotherapy: Secondary | ICD-10-CM

## 2015-05-29 DIAGNOSIS — C50411 Malignant neoplasm of upper-outer quadrant of right female breast: Secondary | ICD-10-CM

## 2015-05-29 LAB — CBC WITH DIFFERENTIAL/PLATELET
BASO%: 0.7 % (ref 0.0–2.0)
Basophils Absolute: 0 10*3/uL (ref 0.0–0.1)
EOS%: 1.6 % (ref 0.0–7.0)
Eosinophils Absolute: 0.1 10*3/uL (ref 0.0–0.5)
HEMATOCRIT: 31.3 % — AB (ref 34.8–46.6)
HGB: 10.8 g/dL — ABNORMAL LOW (ref 11.6–15.9)
LYMPH%: 14.3 % (ref 14.0–49.7)
MCH: 30.1 pg (ref 25.1–34.0)
MCHC: 34.4 g/dL (ref 31.5–36.0)
MCV: 87.5 fL (ref 79.5–101.0)
MONO#: 0.4 10*3/uL (ref 0.1–0.9)
MONO%: 7.9 % (ref 0.0–14.0)
NEUT%: 75.5 % (ref 38.4–76.8)
NEUTROS ABS: 3.5 10*3/uL (ref 1.5–6.5)
PLATELETS: 291 10*3/uL (ref 145–400)
RBC: 3.57 10*6/uL — ABNORMAL LOW (ref 3.70–5.45)
RDW: 16.4 % — ABNORMAL HIGH (ref 11.2–14.5)
WBC: 4.6 10*3/uL (ref 3.9–10.3)
lymph#: 0.7 10*3/uL — ABNORMAL LOW (ref 0.9–3.3)

## 2015-05-29 LAB — COMPREHENSIVE METABOLIC PANEL (CC13)
ALT: 44 U/L (ref 0–55)
ANION GAP: 6 meq/L (ref 3–11)
AST: 27 U/L (ref 5–34)
Albumin: 3.3 g/dL — ABNORMAL LOW (ref 3.5–5.0)
Alkaline Phosphatase: 64 U/L (ref 40–150)
BILIRUBIN TOTAL: 0.35 mg/dL (ref 0.20–1.20)
BUN: 6.1 mg/dL — ABNORMAL LOW (ref 7.0–26.0)
CALCIUM: 8.7 mg/dL (ref 8.4–10.4)
CO2: 27 mEq/L (ref 22–29)
CREATININE: 0.7 mg/dL (ref 0.6–1.1)
Chloride: 108 mEq/L (ref 98–109)
EGFR: >90 mL/min/{1.73_m2} (ref >90)
Glucose: 107 mg/dl (ref 70–140)
Potassium: 3.8 mEq/L (ref 3.5–5.1)
Sodium: 141 mEq/L (ref 136–145)
TOTAL PROTEIN: 6.6 g/dL (ref 6.4–8.3)

## 2015-05-29 MED ORDER — SODIUM CHLORIDE 0.9 % IV SOLN
Freq: Once | INTRAVENOUS | Status: AC
  Administered 2015-05-29: 10:00:00 via INTRAVENOUS
  Filled 2015-05-29: qty 4

## 2015-05-29 MED ORDER — HEPARIN SOD (PORK) LOCK FLUSH 100 UNIT/ML IV SOLN
500.0000 [IU] | Freq: Once | INTRAVENOUS | Status: AC | PRN
  Administered 2015-05-29: 500 [IU]
  Filled 2015-05-29: qty 5

## 2015-05-29 MED ORDER — SODIUM CHLORIDE 0.9 % IJ SOLN
10.0000 mL | INTRAMUSCULAR | Status: DC | PRN
  Administered 2015-05-29: 10 mL
  Filled 2015-05-29: qty 10

## 2015-05-29 MED ORDER — PACLITAXEL PROTEIN-BOUND CHEMO INJECTION 100 MG
80.0000 mg/m2 | Freq: Once | INTRAVENOUS | Status: AC
  Administered 2015-05-29: 150 mg via INTRAVENOUS
  Filled 2015-05-29: qty 30

## 2015-05-29 MED ORDER — SODIUM CHLORIDE 0.9 % IV SOLN
Freq: Once | INTRAVENOUS | Status: AC
  Administered 2015-05-29: 09:00:00 via INTRAVENOUS

## 2015-05-29 NOTE — Patient Instructions (Signed)
[facility] Discharge Instructions for Patients Receiving Chemotherapy  Today you received the following chemotherapy agents: Abraxane   To help prevent nausea and vomiting after your treatment, we encourage you to take your nausea medication as directed.    If you develop nausea and vomiting that is not controlled by your nausea medication, call the clinic.   BELOW ARE SYMPTOMS THAT SHOULD BE REPORTED IMMEDIATELY:  *FEVER GREATER THAN 100.5 F  *CHILLS WITH OR WITHOUT FEVER  NAUSEA AND VOMITING THAT IS NOT CONTROLLED WITH YOUR NAUSEA MEDICATION  *UNUSUAL SHORTNESS OF BREATH  *UNUSUAL BRUISING OR BLEEDING  TENDERNESS IN MOUTH AND THROAT WITH OR WITHOUT PRESENCE OF ULCERS  *URINARY PROBLEMS  *BOWEL PROBLEMS  UNUSUAL RASH Items with * indicate a potential emergency and should be followed up as soon as possible.  Feel free to call the clinic you have any questions or concerns. The clinic phone number is [phone].  Please show the CHEMO ALERT CARD at check-in to the Emergency Department and triage nurse.   

## 2015-05-30 ENCOUNTER — Encounter: Payer: Self-pay | Admitting: *Deleted

## 2015-06-05 ENCOUNTER — Other Ambulatory Visit (HOSPITAL_BASED_OUTPATIENT_CLINIC_OR_DEPARTMENT_OTHER): Payer: 59

## 2015-06-05 ENCOUNTER — Other Ambulatory Visit: Payer: 59

## 2015-06-05 ENCOUNTER — Ambulatory Visit: Payer: 59

## 2015-06-05 ENCOUNTER — Encounter: Payer: Self-pay | Admitting: Nurse Practitioner

## 2015-06-05 ENCOUNTER — Ambulatory Visit (HOSPITAL_BASED_OUTPATIENT_CLINIC_OR_DEPARTMENT_OTHER): Payer: 59

## 2015-06-05 ENCOUNTER — Encounter: Payer: Self-pay | Admitting: Medical Oncology

## 2015-06-05 ENCOUNTER — Ambulatory Visit (HOSPITAL_BASED_OUTPATIENT_CLINIC_OR_DEPARTMENT_OTHER): Payer: 59 | Admitting: Nurse Practitioner

## 2015-06-05 VITALS — BP 139/80 | HR 79 | Temp 98.1°F | Resp 18 | Ht 65.5 in | Wt 189.5 lb

## 2015-06-05 DIAGNOSIS — Z5111 Encounter for antineoplastic chemotherapy: Secondary | ICD-10-CM | POA: Diagnosis not present

## 2015-06-05 DIAGNOSIS — H6122 Impacted cerumen, left ear: Secondary | ICD-10-CM

## 2015-06-05 DIAGNOSIS — D6481 Anemia due to antineoplastic chemotherapy: Secondary | ICD-10-CM

## 2015-06-05 DIAGNOSIS — C50411 Malignant neoplasm of upper-outer quadrant of right female breast: Secondary | ICD-10-CM

## 2015-06-05 DIAGNOSIS — Z170 Estrogen receptor positive status [ER+]: Secondary | ICD-10-CM

## 2015-06-05 DIAGNOSIS — C773 Secondary and unspecified malignant neoplasm of axilla and upper limb lymph nodes: Secondary | ICD-10-CM | POA: Diagnosis not present

## 2015-06-05 DIAGNOSIS — K5900 Constipation, unspecified: Secondary | ICD-10-CM

## 2015-06-05 LAB — CBC WITH DIFFERENTIAL/PLATELET
BASO%: 0.4 % (ref 0.0–2.0)
BASOS ABS: 0 10*3/uL (ref 0.0–0.1)
EOS%: 1.5 % (ref 0.0–7.0)
Eosinophils Absolute: 0.1 10*3/uL (ref 0.0–0.5)
HEMATOCRIT: 30 % — AB (ref 34.8–46.6)
HEMOGLOBIN: 10 g/dL — AB (ref 11.6–15.9)
LYMPH#: 0.7 10*3/uL — AB (ref 0.9–3.3)
LYMPH%: 15.2 % (ref 14.0–49.7)
MCH: 29.2 pg (ref 25.1–34.0)
MCHC: 33.3 g/dL (ref 31.5–36.0)
MCV: 87.5 fL (ref 79.5–101.0)
MONO#: 0.5 10*3/uL (ref 0.1–0.9)
MONO%: 10.9 % (ref 0.0–14.0)
NEUT#: 3.4 10*3/uL (ref 1.5–6.5)
NEUT%: 72 % (ref 38.4–76.8)
PLATELETS: 322 10*3/uL (ref 145–400)
RBC: 3.43 10*6/uL — ABNORMAL LOW (ref 3.70–5.45)
RDW: 15.3 % — AB (ref 11.2–14.5)
WBC: 4.7 10*3/uL (ref 3.9–10.3)

## 2015-06-05 LAB — COMPREHENSIVE METABOLIC PANEL (CC13)
ALBUMIN: 3.3 g/dL — AB (ref 3.5–5.0)
ALK PHOS: 67 U/L (ref 40–150)
ALT: 32 U/L (ref 0–55)
ANION GAP: 7 meq/L (ref 3–11)
AST: 19 U/L (ref 5–34)
BUN: 8 mg/dL (ref 7.0–26.0)
CALCIUM: 8.8 mg/dL (ref 8.4–10.4)
CO2: 26 mEq/L (ref 22–29)
CREATININE: 0.7 mg/dL (ref 0.6–1.1)
Chloride: 108 mEq/L (ref 98–109)
EGFR: >90 mL/min/{1.73_m2} (ref >90)
Glucose: 101 mg/dl (ref 70–140)
Potassium: 3.8 mEq/L (ref 3.5–5.1)
Sodium: 142 mEq/L (ref 136–145)
Total Bilirubin: <0.2 mg/dL (ref 0.20–1.20)
Total Protein: 6.6 g/dL (ref 6.4–8.3)

## 2015-06-05 MED ORDER — HEPARIN SOD (PORK) LOCK FLUSH 100 UNIT/ML IV SOLN
500.0000 [IU] | Freq: Once | INTRAVENOUS | Status: AC | PRN
  Administered 2015-06-05: 500 [IU]
  Filled 2015-06-05: qty 5

## 2015-06-05 MED ORDER — PACLITAXEL PROTEIN-BOUND CHEMO INJECTION 100 MG
80.0000 mg/m2 | Freq: Once | INTRAVENOUS | Status: AC
  Administered 2015-06-05: 150 mg via INTRAVENOUS
  Filled 2015-06-05: qty 30

## 2015-06-05 MED ORDER — SODIUM CHLORIDE 0.9 % IV SOLN
Freq: Once | INTRAVENOUS | Status: AC
  Administered 2015-06-05: 11:00:00 via INTRAVENOUS

## 2015-06-05 MED ORDER — SODIUM CHLORIDE 0.9 % IJ SOLN
10.0000 mL | INTRAMUSCULAR | Status: DC | PRN
  Administered 2015-06-05: 10 mL
  Filled 2015-06-05: qty 10

## 2015-06-05 MED ORDER — SODIUM CHLORIDE 0.9 % IV SOLN
Freq: Once | INTRAVENOUS | Status: AC
  Administered 2015-06-05: 11:00:00 via INTRAVENOUS
  Filled 2015-06-05: qty 4

## 2015-06-05 NOTE — Progress Notes (Signed)
PREVENT study: I met with patient this morning in treatment room during her scheduled Abraxane treatment. I thanked patient for her continued support of study and submission of study calendars. Inquired with patient if she has had any concerns, questions or symptoms to report. Patient states she is doing well and reports no concerns or symptoms. I encouraged patient to call Dr. [name] or myself should she have any questions or concerns and thanked her for her time.  Adele Dan, RN, BSN Clinical Research 06/05/2015 11:52 AM

## 2015-06-05 NOTE — Patient Instructions (Signed)
[facility] Discharge Instructions for Patients Receiving Chemotherapy  Today you received the following chemotherapy agents Abraxane To help prevent nausea and vomiting after your treatment, we encourage you to take your nausea medication as prescribed.   If you develop nausea and vomiting that is not controlled by your nausea medication, call the clinic.   BELOW ARE SYMPTOMS THAT SHOULD BE REPORTED IMMEDIATELY:  *FEVER GREATER THAN 100.5 F  *CHILLS WITH OR WITHOUT FEVER  NAUSEA AND VOMITING THAT IS NOT CONTROLLED WITH YOUR NAUSEA MEDICATION  *UNUSUAL SHORTNESS OF BREATH  *UNUSUAL BRUISING OR BLEEDING  TENDERNESS IN MOUTH AND THROAT WITH OR WITHOUT PRESENCE OF ULCERS  *URINARY PROBLEMS  *BOWEL PROBLEMS  UNUSUAL RASH Items with * indicate a potential emergency and should be followed up as soon as possible.  Feel free to call the clinic you have any questions or concerns. The clinic phone number is [phone].  Please show the CHEMO ALERT CARD at check-in to the Emergency Department and triage nurse.   

## 2015-06-05 NOTE — Progress Notes (Signed)
Patient Care Team: [name], MD as PCP - General (Family Medicine) Chauncey Cruel, MD as Consulting Physician (Oncology) Thea Silversmith, MD as Consulting Physician (Radiation Oncology) Mauro Kaufmann, RN as Registered Nurse Rockwell Germany, RN as Registered Nurse Alphonsa Overall, MD as Consulting Physician (General Surgery)  DIAGNOSIS: Breast cancer of upper-outer quadrant of right female breast   Staging form: Breast, AJCC 7th Edition     Clinical stage from 02/08/2015: Stage IIIA (T3, N1, M0) - Unsigned   SUMMARY OF ONCOLOGIC HISTORY:   Breast cancer of upper-outer quadrant of right female breast   01/27/2015 Initial Diagnosis Right breast 9:00 and 10:00 biopsy: Grade 3 IDC ER/PR negative, HER-2 negative, Ki-67 77% and 73%; the right axillary lymph node biopsy: EF 22% positive   01/30/2015 Breast MRI Right breast masses 9 and 10:00 position, additional irregular masses with non-mass enhancement upper outer quadrant with one mass extending into upper inner quadrant, multifocal, multicentric 13.7 x 4.6 x 4.6 cm, malignant right axillary lymph node   02/15/2015 Imaging No evidence of metastatic disease, 2.2 cm enhancing lesion anterior left lower kidney suspicious for solid renal neoplasm   02/20/2015 -  Neo-Adjuvant Chemotherapy Adriamycin and cytoxan dose dense Q 2 weeks X 4 foll by Abraxane weekly x 12    CHIEF COMPLIANT: cycle 7 abraxane  INTERVAL HISTORY: [name] is a 44 year old with above-mentioned history of right breast cancer currently on neoadjuvant chemotherapy. She is due for cycle 7 of abraxane today. She tolerates this well besides joint aches after treatment. Her vulvar abscess is resolving since keflex completed. New complaint voiced this week is ear fullness and hearing loss to the left ear. This is worse at night and improves during the day. It has been going on for about 4-6 weeks, she just never remembers to mention while she is here.   REVIEW OF SYSTEMS:    Constitutional: Denies fevers, chills or abnormal weight loss Eyes: Denies blurriness of vision Ears, nose, mouth, throat, and face: Denies mucositis or sore throat Respiratory: Denies cough, dyspnea or wheezes Cardiovascular: Denies palpitation, chest discomfort or lower extremity swelling Gastrointestinal:  Denies nausea, heartburn or change in bowel habits Skin: Right vulvar abscess Lymphatics: Denies new lymphadenopathy or easy bruising Neurological:Denies numbness, tingling or new weaknesses Behavioral/Psych: Mood is stable, no new changes   All other systems were reviewed with the patient and are negative.  I have reviewed the past medical history, past surgical history, social history and family history with the patient and they are unchanged from previous note.  ALLERGIES:  has No Known Allergies.  MEDICATIONS:  Current Outpatient Prescriptions  Medication Sig Dispense Refill  . Atorvastatin Calcium (INVESTIGATIONAL ATORVASTATIN/PLACEBO) 40 MG tablet [facility] [id number] Take 1 tablet by mouth daily. Take 2 doses (these doses must be 12 hours apart) prior to first chemotherapy treatment. Then take 1 tablet daily by mouth with or without food. 180 tablet 0  . dexamethasone (DECADRON) 4 MG tablet TAKE 1 TABLET 1X A DAY ON THE DAY AFTER CHEMOTHERAPY& THEN 1 TABLET 2X A DAY X 2 DAYS W/ FOOD  1  . diphenhydramine-acetaminophen (TYLENOL PM) 25-500 MG TABS Take 1 tablet by mouth at bedtime as needed (sleep).     Marland Kitchen ECHINACEA PO Take 1 tablet by mouth daily.     . fluconazole (DIFLUCAN) 100 MG tablet Take 100 mg by mouth daily.  0  . Ginger, Zingiber officinalis, (GINGER ROOT PO) Take 1 tablet by mouth daily.     Marland Kitchen  HYDROcodone-acetaminophen (NORCO/VICODIN) 5-325 MG per tablet     . LORazepam (ATIVAN) 0.5 MG tablet Take 1 tablet (0.5 mg total) by mouth at bedtime. (Patient not taking: Reported on 06/05/2015) 30 tablet 0  . Multiple Vitamin (MULTIVITAMIN WITH MINERALS) TABS tablet Take 1  tablet by mouth daily.    . ondansetron (ZOFRAN) 8 MG tablet TAKE 1 TABLET TWICE A DAY AS NEEDED , START ON THE THIRD DAY AFTER CHEMOTHERAPY  1  . prochlorperazine (COMPAZINE) 10 MG tablet TAKE 1 TABLET (10 MG TOTAL) BY MOUTH EVERY 6 (SIX) HOURS AS NEEDED (NAUSEA OR VOMITING).  1  . TURMERIC CURCUMIN PO Take 1 capsule by mouth daily.      No current facility-administered medications for this visit.    PHYSICAL EXAMINATION: ECOG PERFORMANCE STATUS: 1 - Symptomatic but completely ambulatory  Filed Vitals:   06/05/15 0924  BP: 139/80  Pulse: 79  Temp: 98.1 F (36.7 C)  Resp: 18   Filed Weights   06/05/15 0924 06/05/15 0938  Weight: 196 lb 4.8 oz (89.041 kg) 189 lb 8 oz (85.957 kg)    GENERAL:alert, no distress and comfortable SKIN: skin color, texture, turgor are normal, no rashes or significant lesions EYES: normal, Conjunctiva are pink and non-injected, sclera clear OROPHARYNX:no exudate, no erythema and lips, buccal mucosa, and tongue normal  NECK: supple, thyroid normal size, non-tender, without nodularity LYMPH:  no palpable lymphadenopathy in the cervical, axillary or inguinal LUNGS: clear to auscultation and percussion with normal breathing effort HEART: regular rate & rhythm and no murmurs and no lower extremity edema ABDOMEN:abdomen soft, non-tender and normal bowel sounds GYN: External examination of the vulvar area revealed a 2 cm nodule that is tender to palpation most likely an abscess Musculoskeletal:no cyanosis of digits and no clubbing  NEURO: alert & oriented x 3 with fluent speech, no focal motor/sensory deficits   LABORATORY DATA:  I have reviewed the data as listed   Chemistry      Component Value Date/Time   NA 142 06/05/2015 0909   NA 139 02/14/2015 1547   K 3.8 06/05/2015 0909   K 3.5 02/14/2015 1547   CL 104 02/14/2015 1547   CO2 26 06/05/2015 0909   CO2 24 02/14/2015 1547   BUN 8.0 06/05/2015 0909   BUN 5* 02/14/2015 1547   CREATININE 0.7  06/05/2015 0909   CREATININE 0.65 02/14/2015 1547      Component Value Date/Time   CALCIUM 8.8 06/05/2015 0909   CALCIUM 9.3 02/14/2015 1547   ALKPHOS 67 06/05/2015 0909   ALKPHOS 61 02/14/2015 1547   AST 19 06/05/2015 0909   AST 15 02/14/2015 1547   ALT 32 06/05/2015 0909   ALT 12* 02/14/2015 1547   BILITOT <0.20 06/05/2015 0909   BILITOT 0.7 02/14/2015 1547       Lab Results  Component Value Date   WBC 4.7 06/05/2015   HGB 10.0* 06/05/2015   HCT 30.0* 06/05/2015   MCV 87.5 06/05/2015   PLT 322 06/05/2015   NEUTROABS 3.4 06/05/2015   ASSESSMENT & PLAN:  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 Patient enrolled in PREVENT clinical trial: Lipitor vs Placebo (no toxicities from this study)  Current treatment: Neo-adjuvant chemotherapy completed Dose dense Adriamycin and Cytoxan 4; Today is cycle 7/12 weekly cycles of abraxane    Chemo toxicities: 1. Constipation 2. Heartburn 3. Grade 1 nausea related to constipation 4. Grade 1 anemia: With a history of sickle cell trait. We will monitor this 5. Left groin infection with pus to that burst open after cycle 2 of AC: Received Diflucan for 7 days with Keflex for 7 days. Resolved. 6. Alopecia 7. Rash on the face which is itching: Resolved with cortisone cream 8. Depression: Because she is still struggling with why she got this cancer. 9. Right vulvar abscess: Prescribed Keflex 500 twice a day for 10 days  05/15/2015 10. Left ear cerumen impaction: likely cause for unilateral hearing loss. Suggested she visit PCP for removal and avoid use of q-tips. Patient uninterested. Will try OTC interventions.   Will return weekly for abraxane and in 2 weeks for her next office visit  No orders of the defined types were placed in this encounter.   The patient has a good understanding of the overall plan. she agrees with it. she will call with any problems that may develop before the next visit here.   Laurie Panda, NP

## 2015-06-09 ENCOUNTER — Telehealth: Payer: Self-pay | Admitting: *Deleted

## 2015-06-09 NOTE — Telephone Encounter (Signed)
Patient called stating that she was having is having lower "period like pain." RN called patient back to get more information. Patient states that pain is subsided with pain medication. Instructed patient that if pain worsens, to go to the ED/Urgent Care. Patient verbalized understanding.

## 2015-06-12 ENCOUNTER — Other Ambulatory Visit (HOSPITAL_BASED_OUTPATIENT_CLINIC_OR_DEPARTMENT_OTHER): Payer: 59

## 2015-06-12 ENCOUNTER — Other Ambulatory Visit: Payer: 59

## 2015-06-12 ENCOUNTER — Telehealth: Payer: Self-pay | Admitting: Hematology and Oncology

## 2015-06-12 ENCOUNTER — Ambulatory Visit (HOSPITAL_BASED_OUTPATIENT_CLINIC_OR_DEPARTMENT_OTHER): Payer: 59

## 2015-06-12 ENCOUNTER — Ambulatory Visit (HOSPITAL_BASED_OUTPATIENT_CLINIC_OR_DEPARTMENT_OTHER): Payer: 59 | Admitting: Hematology and Oncology

## 2015-06-12 ENCOUNTER — Ambulatory Visit: Payer: 59 | Admitting: Hematology and Oncology

## 2015-06-12 ENCOUNTER — Other Ambulatory Visit: Payer: Self-pay | Admitting: *Deleted

## 2015-06-12 ENCOUNTER — Telehealth: Payer: Self-pay | Admitting: *Deleted

## 2015-06-12 ENCOUNTER — Encounter: Payer: Self-pay | Admitting: Hematology and Oncology

## 2015-06-12 VITALS — BP 136/81 | HR 88 | Temp 98.5°F | Resp 18 | Ht 65.5 in | Wt 192.0 lb

## 2015-06-12 DIAGNOSIS — C773 Secondary and unspecified malignant neoplasm of axilla and upper limb lymph nodes: Secondary | ICD-10-CM

## 2015-06-12 DIAGNOSIS — Z5111 Encounter for antineoplastic chemotherapy: Secondary | ICD-10-CM | POA: Diagnosis not present

## 2015-06-12 DIAGNOSIS — C50411 Malignant neoplasm of upper-outer quadrant of right female breast: Secondary | ICD-10-CM | POA: Diagnosis not present

## 2015-06-12 DIAGNOSIS — K5900 Constipation, unspecified: Secondary | ICD-10-CM

## 2015-06-12 DIAGNOSIS — Z170 Estrogen receptor positive status [ER+]: Secondary | ICD-10-CM

## 2015-06-12 DIAGNOSIS — D649 Anemia, unspecified: Secondary | ICD-10-CM

## 2015-06-12 DIAGNOSIS — F329 Major depressive disorder, single episode, unspecified: Secondary | ICD-10-CM

## 2015-06-12 LAB — CBC WITH DIFFERENTIAL/PLATELET
BASO%: 0.7 % (ref 0.0–2.0)
BASOS ABS: 0 10*3/uL (ref 0.0–0.1)
EOS ABS: 0 10*3/uL (ref 0.0–0.5)
EOS%: 0.9 % (ref 0.0–7.0)
HCT: 31 % — ABNORMAL LOW (ref 34.8–46.6)
HGB: 10.4 g/dL — ABNORMAL LOW (ref 11.6–15.9)
LYMPH%: 16.2 % (ref 14.0–49.7)
MCH: 29.5 pg (ref 25.1–34.0)
MCHC: 33.5 g/dL (ref 31.5–36.0)
MCV: 87.9 fL (ref 79.5–101.0)
MONO#: 0.5 10*3/uL (ref 0.1–0.9)
MONO%: 10.2 % (ref 0.0–14.0)
NEUT#: 3.4 10*3/uL (ref 1.5–6.5)
NEUT%: 72 % (ref 38.4–76.8)
Platelets: 305 10*3/uL (ref 145–400)
RBC: 3.53 10*6/uL — AB (ref 3.70–5.45)
RDW: 16.2 % — AB (ref 11.2–14.5)
WBC: 4.7 10*3/uL (ref 3.9–10.3)
lymph#: 0.8 10*3/uL — ABNORMAL LOW (ref 0.9–3.3)

## 2015-06-12 LAB — COMPREHENSIVE METABOLIC PANEL (CC13)
ALBUMIN: 3.3 g/dL — AB (ref 3.5–5.0)
ALK PHOS: 69 U/L (ref 40–150)
ALT: 27 U/L (ref 0–55)
AST: 20 U/L (ref 5–34)
Anion Gap: 8 mEq/L (ref 3–11)
BUN: 6.3 mg/dL — AB (ref 7.0–26.0)
CHLORIDE: 109 meq/L (ref 98–109)
CO2: 24 meq/L (ref 22–29)
Calcium: 8.8 mg/dL (ref 8.4–10.4)
Creatinine: 0.7 mg/dL (ref 0.6–1.1)
GLUCOSE: 129 mg/dL (ref 70–140)
POTASSIUM: 3.5 meq/L (ref 3.5–5.1)
SODIUM: 141 meq/L (ref 136–145)
Total Bilirubin: 0.29 mg/dL (ref 0.20–1.20)
Total Protein: 6.6 g/dL (ref 6.4–8.3)

## 2015-06-12 MED ORDER — SODIUM CHLORIDE 0.9 % IJ SOLN
10.0000 mL | INTRAMUSCULAR | Status: DC | PRN
  Administered 2015-06-12: 10 mL
  Filled 2015-06-12: qty 10

## 2015-06-12 MED ORDER — SODIUM CHLORIDE 0.9 % IV SOLN
Freq: Once | INTRAVENOUS | Status: AC
  Administered 2015-06-12: 10:00:00 via INTRAVENOUS

## 2015-06-12 MED ORDER — HEPARIN SOD (PORK) LOCK FLUSH 100 UNIT/ML IV SOLN
500.0000 [IU] | Freq: Once | INTRAVENOUS | Status: AC | PRN
  Administered 2015-06-12: 500 [IU]
  Filled 2015-06-12: qty 5

## 2015-06-12 MED ORDER — SODIUM CHLORIDE 0.9 % IV SOLN
Freq: Once | INTRAVENOUS | Status: AC
  Administered 2015-06-12: 10:00:00 via INTRAVENOUS
  Filled 2015-06-12: qty 4

## 2015-06-12 MED ORDER — PACLITAXEL PROTEIN-BOUND CHEMO INJECTION 100 MG
80.0000 mg/m2 | Freq: Once | INTRAVENOUS | Status: AC
  Administered 2015-06-12: 150 mg via INTRAVENOUS
  Filled 2015-06-12: qty 30

## 2015-06-12 NOTE — Progress Notes (Signed)
Patient Care Team: [name], MD as PCP - General (Family Medicine) Chauncey Cruel, MD as Consulting Physician (Oncology) Thea Silversmith, MD as Consulting Physician (Radiation Oncology) Mauro Kaufmann, RN as Registered Nurse Rockwell Germany, RN as Registered Nurse Alphonsa Overall, MD as Consulting Physician (General Surgery)  DIAGNOSIS: Breast cancer of upper-outer quadrant of right female breast   Staging form: Breast, AJCC 7th Edition     Clinical stage from 02/08/2015: Stage IIIA (T3, N1, M0) - Unsigned   SUMMARY OF ONCOLOGIC HISTORY:   Breast cancer of upper-outer quadrant of right female breast   01/27/2015 Initial Diagnosis Right breast 9:00 and 10:00 biopsy: Grade 3 IDC ER/PR negative, HER-2 negative, Ki-67 77% and 73%; the right axillary lymph node biopsy: EF 22% positive   01/30/2015 Breast MRI Right breast masses 9 and 10:00 position, additional irregular masses with non-mass enhancement upper outer quadrant with one mass extending into upper inner quadrant, multifocal, multicentric 13.7 x 4.6 x 4.6 cm, malignant right axillary lymph node   02/15/2015 Imaging No evidence of metastatic disease, 2.2 cm enhancing lesion anterior left lower kidney suspicious for solid renal neoplasm   02/20/2015 -  Neo-Adjuvant Chemotherapy Adriamycin and cytoxan dose dense Q 2 weeks X 4 foll by Abraxane weekly x 12    CHIEF COMPLIANT: Cycle 8 Abraxane  INTERVAL HISTORY: Jaquanna Ballentine is a 44 year old with above-mentioned history of right breast cancer currently in new adjuvant chemotherapy. Today is #8 of Abraxane. She reports no new problems or concerns. Vulvar abscess has resolved. She complained of one day of intense abdominal pain for which she took ibuprofen and laid back and she felt better. It could be related to ovarian cyst rupture. She complained of neuropathy and only one digit on her toe for 3-4 days which resolved. He does not have any tingling or numbness of her hands or feet.  Denies any nausea. She has decent taste. The only concern is she has poor appetite. She is concerned about her weight gain.  REVIEW OF SYSTEMS:   Constitutional: Denies fevers, chills or abnormal weight loss Eyes: Denies blurriness of vision Ears, nose, mouth, throat, and face: Denies mucositis or sore throat Respiratory: Denies cough, dyspnea or wheezes Cardiovascular: Denies palpitation, chest discomfort or lower extremity swelling Gastrointestinal:  Denies nausea, heartburn or change in bowel habits Skin: Denies abnormal skin rashes Lymphatics: Denies new lymphadenopathy or easy bruising Neurological: 1 toe involving the right leg is occasionally numb Behavioral/Psych: Mood is stable, no new changes  All other systems were reviewed with the patient and are negative.  I have reviewed the past medical history, past surgical history, social history and family history with the patient and they are unchanged from previous note.  ALLERGIES:  has No Known Allergies.  MEDICATIONS:  Current Outpatient Prescriptions  Medication Sig Dispense Refill  . Atorvastatin Calcium (INVESTIGATIONAL ATORVASTATIN/PLACEBO) 40 MG tablet [facility] [id number] Take 1 tablet by mouth daily. Take 2 doses (these doses must be 12 hours apart) prior to first chemotherapy treatment. Then take 1 tablet daily by mouth with or without food. 180 tablet 0  . dexamethasone (DECADRON) 4 MG tablet TAKE 1 TABLET 1X A DAY ON THE DAY AFTER CHEMOTHERAPY& THEN 1 TABLET 2X A DAY X 2 DAYS W/ FOOD  1  . diphenhydramine-acetaminophen (TYLENOL PM) 25-500 MG TABS Take 1 tablet by mouth at bedtime as needed (sleep).     Marland Kitchen ECHINACEA PO Take 1 tablet by mouth daily.     Marland Kitchen  fluconazole (DIFLUCAN) 100 MG tablet Take 100 mg by mouth daily.  0  . Ginger, Zingiber officinalis, (GINGER ROOT PO) Take 1 tablet by mouth daily.     Marland Kitchen HYDROcodone-acetaminophen (NORCO/VICODIN) 5-325 MG per tablet     . LORazepam (ATIVAN) 0.5 MG tablet Take 1 tablet  (0.5 mg total) by mouth at bedtime. 30 tablet 0  . Multiple Vitamin (MULTIVITAMIN WITH MINERALS) TABS tablet Take 1 tablet by mouth daily.    . ondansetron (ZOFRAN) 8 MG tablet TAKE 1 TABLET TWICE A DAY AS NEEDED , START ON THE THIRD DAY AFTER CHEMOTHERAPY  1  . prochlorperazine (COMPAZINE) 10 MG tablet TAKE 1 TABLET (10 MG TOTAL) BY MOUTH EVERY 6 (SIX) HOURS AS NEEDED (NAUSEA OR VOMITING).  1  . TURMERIC CURCUMIN PO Take 1 capsule by mouth daily.      No current facility-administered medications for this visit.    PHYSICAL EXAMINATION: ECOG PERFORMANCE STATUS: 1 - Symptomatic but completely ambulatory  Filed Vitals:   06/12/15 0830  BP: 136/81  Pulse: 88  Temp: 98.5 F (36.9 C)  Resp: 18   Filed Weights   06/12/15 0830  Weight: 192 lb (87.091 kg)    GENERAL:alert, no distress and comfortable SKIN: skin color, texture, turgor are normal, no rashes or significant lesions EYES: normal, Conjunctiva are pink and non-injected, sclera clear OROPHARYNX:no exudate, no erythema and lips, buccal mucosa, and tongue normal  NECK: supple, thyroid normal size, non-tender, without nodularity LYMPH:  no palpable lymphadenopathy in the cervical, axillary or inguinal LUNGS: clear to auscultation and percussion with normal breathing effort HEART: regular rate & rhythm and no murmurs and no lower extremity edema ABDOMEN:abdomen soft, non-tender and normal bowel sounds Musculoskeletal:no cyanosis of digits and no clubbing  NEURO: alert & oriented x 3 with fluent speech, no focal motor/sensory deficits  LABORATORY DATA:  I have reviewed the data as listed   Chemistry      Component Value Date/Time   NA 142 06/05/2015 0909   NA 139 02/14/2015 1547   K 3.8 06/05/2015 0909   K 3.5 02/14/2015 1547   CL 104 02/14/2015 1547   CO2 26 06/05/2015 0909   CO2 24 02/14/2015 1547   BUN 8.0 06/05/2015 0909   BUN 5* 02/14/2015 1547   CREATININE 0.7 06/05/2015 0909   CREATININE 0.65 02/14/2015 1547       Component Value Date/Time   CALCIUM 8.8 06/05/2015 0909   CALCIUM 9.3 02/14/2015 1547   ALKPHOS 67 06/05/2015 0909   ALKPHOS 61 02/14/2015 1547   AST 19 06/05/2015 0909   AST 15 02/14/2015 1547   ALT 32 06/05/2015 0909   ALT 12* 02/14/2015 1547   BILITOT <0.20 06/05/2015 0909   BILITOT 0.7 02/14/2015 1547       Lab Results  Component Value Date   WBC 4.7 06/12/2015   HGB 10.4* 06/12/2015   HCT 31.0* 06/12/2015   MCV 87.9 06/12/2015   PLT 305 06/12/2015   NEUTROABS 3.4 06/12/2015    ASSESSMENT & PLAN:  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 Patient enrolled in PREVENT clinical trial: Lipitor vs Placebo (no toxicities from this study)  Current treatment: Neo-adjuvant chemotherapy completed Dose dense Adriamycin and Cytoxan 4; Today is cycle 8/12 weekly cycles of abraxane   Chemo toxicities: 1. Constipation 2. Heartburn 3. Grade 1 nausea related to constipation 4. Grade 1 anemia: With a history of sickle cell trait. We will monitor this 5. Left groin infection with pus to that burst open after cycle 2 of AC: Received Diflucan for 7 days with Keflex for 7 days. Resolved. 6. Alopecia 7. Rash on the face which is itching: Resolved with cortisone cream 8. Depression: Because she is still struggling with why she got this cancer. 9. Right vulvar abscess: Prescribed Keflex 500 twice a day for 10 days 05/15/2015 10. Left ear cerumen impaction: likely cause for  unilateral hearing loss. Suggested she visit PCP for removal and avoid use of q-tips.  11. Abdominal cramps: With ibuprofen. (I suspect this may be related to an ovarian cyst rupture) it has resolved.  Will return weekly for abraxane and in 2 weeks for her next office visit with cycle 10. After that we will plan for breast MRIs and tumor board discussion and surgery planning. (Patient tells me that she would not like to have surgery during the week of October 17 because her husband would be away in [place].)   No orders of the defined types were placed in this encounter.   The patient has a good understanding of the overall plan. she agrees with it. she will call with any problems that may develop before the next visit here.   Rulon Eisenmenger, MD

## 2015-06-12 NOTE — Assessment & Plan Note (Signed)
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 Patient enrolled in PREVENT clinical trial: Lipitor vs Placebo (no toxicities from this study)  Current treatment: Neo-adjuvant chemotherapy completed Dose dense Adriamycin and Cytoxan 4; Today is cycle 8/12 weekly cycles of abraxane   Chemo toxicities: 1. Constipation 2. Heartburn 3. Grade 1 nausea related to constipation 4. Grade 1 anemia: With a history of sickle cell trait. We will monitor this 5. Left groin infection with pus to that burst open after cycle 2 of AC: Received Diflucan for 7 days with Keflex for 7 days. Resolved. 6. Alopecia 7. Rash on the face which is itching: Resolved with cortisone cream 8. Depression: Because she is still struggling with why she got this cancer. 9. Right vulvar abscess: Prescribed Keflex 500 twice a day for 10 days 05/15/2015 10. Left ear cerumen impaction: likely cause for unilateral hearing loss. Suggested she visit PCP for removal and avoid use of q-tips.   Will return weekly for abraxane and in 2 weeks for her next office visit

## 2015-06-12 NOTE — Telephone Encounter (Signed)
Confirmed follow up appointment post MRI for 9/28 at 11am.  She will see Dr. Lucia Gaskins as well on 9/28 at 830am.

## 2015-06-12 NOTE — Telephone Encounter (Signed)
Gave avs & calendar for September °

## 2015-06-12 NOTE — Patient Instructions (Signed)
[facility] Discharge Instructions for Patients Receiving Chemotherapy  Today you received the following chemotherapy agents: Abraxane   To help prevent nausea and vomiting after your treatment, we encourage you to take your nausea medication as directed.    If you develop nausea and vomiting that is not controlled by your nausea medication, call the clinic.   BELOW ARE SYMPTOMS THAT SHOULD BE REPORTED IMMEDIATELY:  *FEVER GREATER THAN 100.5 F  *CHILLS WITH OR WITHOUT FEVER  NAUSEA AND VOMITING THAT IS NOT CONTROLLED WITH YOUR NAUSEA MEDICATION  *UNUSUAL SHORTNESS OF BREATH  *UNUSUAL BRUISING OR BLEEDING  TENDERNESS IN MOUTH AND THROAT WITH OR WITHOUT PRESENCE OF ULCERS  *URINARY PROBLEMS  *BOWEL PROBLEMS  UNUSUAL RASH Items with * indicate a potential emergency and should be followed up as soon as possible.  Feel free to call the clinic you have any questions or concerns. The clinic phone number is [phone].  Please show the CHEMO ALERT CARD at check-in to the Emergency Department and triage nurse.   

## 2015-06-20 ENCOUNTER — Ambulatory Visit (HOSPITAL_BASED_OUTPATIENT_CLINIC_OR_DEPARTMENT_OTHER): Payer: 59

## 2015-06-20 ENCOUNTER — Other Ambulatory Visit (HOSPITAL_BASED_OUTPATIENT_CLINIC_OR_DEPARTMENT_OTHER): Payer: 59

## 2015-06-20 VITALS — BP 151/89 | HR 85 | Temp 98.2°F | Resp 20

## 2015-06-20 DIAGNOSIS — C50411 Malignant neoplasm of upper-outer quadrant of right female breast: Secondary | ICD-10-CM | POA: Diagnosis not present

## 2015-06-20 DIAGNOSIS — Z5111 Encounter for antineoplastic chemotherapy: Secondary | ICD-10-CM

## 2015-06-20 LAB — COMPREHENSIVE METABOLIC PANEL (CC13)
ALK PHOS: 61 U/L (ref 40–150)
ALT: 25 U/L (ref 0–55)
ANION GAP: 6 meq/L (ref 3–11)
AST: 17 U/L (ref 5–34)
Albumin: 3.4 g/dL — ABNORMAL LOW (ref 3.5–5.0)
BILIRUBIN TOTAL: 0.34 mg/dL (ref 0.20–1.20)
BUN: 9.5 mg/dL (ref 7.0–26.0)
CO2: 29 meq/L (ref 22–29)
Calcium: 9.4 mg/dL (ref 8.4–10.4)
Chloride: 107 mEq/L (ref 98–109)
Creatinine: 0.7 mg/dL (ref 0.6–1.1)
GLUCOSE: 96 mg/dL (ref 70–140)
POTASSIUM: 3.9 meq/L (ref 3.5–5.1)
SODIUM: 142 meq/L (ref 136–145)
TOTAL PROTEIN: 6.9 g/dL (ref 6.4–8.3)

## 2015-06-20 LAB — CBC WITH DIFFERENTIAL/PLATELET
BASO%: 0.8 % (ref 0.0–2.0)
BASOS ABS: 0 10*3/uL (ref 0.0–0.1)
EOS ABS: 0 10*3/uL (ref 0.0–0.5)
EOS%: 0.7 % (ref 0.0–7.0)
HCT: 32.1 % — ABNORMAL LOW (ref 34.8–46.6)
HGB: 10.7 g/dL — ABNORMAL LOW (ref 11.6–15.9)
LYMPH%: 14.2 % (ref 14.0–49.7)
MCH: 29.3 pg (ref 25.1–34.0)
MCHC: 33.5 g/dL (ref 31.5–36.0)
MCV: 87.4 fL (ref 79.5–101.0)
MONO#: 0.5 10*3/uL (ref 0.1–0.9)
MONO%: 10.5 % (ref 0.0–14.0)
NEUT%: 73.8 % (ref 38.4–76.8)
NEUTROS ABS: 3.8 10*3/uL (ref 1.5–6.5)
PLATELETS: 284 10*3/uL (ref 145–400)
RBC: 3.67 10*6/uL — AB (ref 3.70–5.45)
RDW: 15.7 % — ABNORMAL HIGH (ref 11.2–14.5)
WBC: 5.2 10*3/uL (ref 3.9–10.3)
lymph#: 0.7 10*3/uL — ABNORMAL LOW (ref 0.9–3.3)

## 2015-06-20 MED ORDER — SODIUM CHLORIDE 0.9 % IV SOLN
Freq: Once | INTRAVENOUS | Status: AC
  Administered 2015-06-20: 09:00:00 via INTRAVENOUS
  Filled 2015-06-20: qty 4

## 2015-06-20 MED ORDER — PACLITAXEL PROTEIN-BOUND CHEMO INJECTION 100 MG
80.0000 mg/m2 | Freq: Once | INTRAVENOUS | Status: AC
  Administered 2015-06-20: 150 mg via INTRAVENOUS
  Filled 2015-06-20: qty 30

## 2015-06-20 MED ORDER — SODIUM CHLORIDE 0.9 % IV SOLN
Freq: Once | INTRAVENOUS | Status: AC
  Administered 2015-06-20: 09:00:00 via INTRAVENOUS

## 2015-06-20 MED ORDER — SODIUM CHLORIDE 0.9 % IJ SOLN
10.0000 mL | INTRAMUSCULAR | Status: DC | PRN
  Administered 2015-06-20: 10 mL
  Filled 2015-06-20: qty 10

## 2015-06-20 MED ORDER — HEPARIN SOD (PORK) LOCK FLUSH 100 UNIT/ML IV SOLN
500.0000 [IU] | Freq: Once | INTRAVENOUS | Status: AC | PRN
  Administered 2015-06-20: 500 [IU]
  Filled 2015-06-20: qty 5

## 2015-06-20 NOTE — Patient Instructions (Signed)
[facility] Discharge Instructions for Patients Receiving Chemotherapy  Today you received the following chemotherapy agents: Abraxane   To help prevent nausea and vomiting after your treatment, we encourage you to take your nausea medication as directed.    If you develop nausea and vomiting that is not controlled by your nausea medication, call the clinic.   BELOW ARE SYMPTOMS THAT SHOULD BE REPORTED IMMEDIATELY:  *FEVER GREATER THAN 100.5 F  *CHILLS WITH OR WITHOUT FEVER  NAUSEA AND VOMITING THAT IS NOT CONTROLLED WITH YOUR NAUSEA MEDICATION  *UNUSUAL SHORTNESS OF BREATH  *UNUSUAL BRUISING OR BLEEDING  TENDERNESS IN MOUTH AND THROAT WITH OR WITHOUT PRESENCE OF ULCERS  *URINARY PROBLEMS  *BOWEL PROBLEMS  UNUSUAL RASH Items with * indicate a potential emergency and should be followed up as soon as possible.  Feel free to call the clinic you have any questions or concerns. The clinic phone number is [phone].  Please show the CHEMO ALERT CARD at check-in to the Emergency Department and triage nurse.   

## 2015-06-26 ENCOUNTER — Telehealth: Payer: Self-pay | Admitting: Hematology and Oncology

## 2015-06-26 ENCOUNTER — Other Ambulatory Visit (HOSPITAL_BASED_OUTPATIENT_CLINIC_OR_DEPARTMENT_OTHER): Payer: 59

## 2015-06-26 ENCOUNTER — Encounter: Payer: Self-pay | Admitting: *Deleted

## 2015-06-26 ENCOUNTER — Encounter: Payer: Self-pay | Admitting: Hematology and Oncology

## 2015-06-26 ENCOUNTER — Ambulatory Visit (HOSPITAL_BASED_OUTPATIENT_CLINIC_OR_DEPARTMENT_OTHER): Payer: 59

## 2015-06-26 ENCOUNTER — Ambulatory Visit (HOSPITAL_BASED_OUTPATIENT_CLINIC_OR_DEPARTMENT_OTHER): Payer: 59 | Admitting: Hematology and Oncology

## 2015-06-26 VITALS — BP 149/90 | HR 87 | Temp 98.5°F | Resp 18 | Ht 65.5 in | Wt 193.1 lb

## 2015-06-26 DIAGNOSIS — K5900 Constipation, unspecified: Secondary | ICD-10-CM

## 2015-06-26 DIAGNOSIS — C50411 Malignant neoplasm of upper-outer quadrant of right female breast: Secondary | ICD-10-CM | POA: Diagnosis not present

## 2015-06-26 DIAGNOSIS — D649 Anemia, unspecified: Secondary | ICD-10-CM

## 2015-06-26 DIAGNOSIS — G620 Drug-induced polyneuropathy: Secondary | ICD-10-CM | POA: Diagnosis not present

## 2015-06-26 DIAGNOSIS — Z171 Estrogen receptor negative status [ER-]: Secondary | ICD-10-CM | POA: Diagnosis not present

## 2015-06-26 DIAGNOSIS — Z5111 Encounter for antineoplastic chemotherapy: Secondary | ICD-10-CM | POA: Diagnosis not present

## 2015-06-26 DIAGNOSIS — F329 Major depressive disorder, single episode, unspecified: Secondary | ICD-10-CM

## 2015-06-26 LAB — CBC WITH DIFFERENTIAL/PLATELET
BASO%: 0.9 % (ref 0.0–2.0)
Basophils Absolute: 0 10*3/uL (ref 0.0–0.1)
EOS%: 0.9 % (ref 0.0–7.0)
Eosinophils Absolute: 0 10*3/uL (ref 0.0–0.5)
HEMATOCRIT: 32.3 % — AB (ref 34.8–46.6)
HEMOGLOBIN: 10.8 g/dL — AB (ref 11.6–15.9)
LYMPH#: 0.7 10*3/uL — AB (ref 0.9–3.3)
LYMPH%: 15.1 % (ref 14.0–49.7)
MCH: 29.5 pg (ref 25.1–34.0)
MCHC: 33.5 g/dL (ref 31.5–36.0)
MCV: 87.9 fL (ref 79.5–101.0)
MONO#: 0.3 10*3/uL (ref 0.1–0.9)
MONO%: 6.7 % (ref 0.0–14.0)
NEUT%: 76.4 % (ref 38.4–76.8)
NEUTROS ABS: 3.4 10*3/uL (ref 1.5–6.5)
Platelets: 291 10*3/uL (ref 145–400)
RBC: 3.67 10*6/uL — ABNORMAL LOW (ref 3.70–5.45)
RDW: 15.4 % — AB (ref 11.2–14.5)
WBC: 4.4 10*3/uL (ref 3.9–10.3)

## 2015-06-26 LAB — COMPREHENSIVE METABOLIC PANEL (CC13)
ALT: 27 U/L (ref 0–55)
AST: 21 U/L (ref 5–34)
Albumin: 3.3 g/dL — ABNORMAL LOW (ref 3.5–5.0)
Alkaline Phosphatase: 69 U/L (ref 40–150)
Anion Gap: 5 mEq/L (ref 3–11)
BILIRUBIN TOTAL: 0.42 mg/dL (ref 0.20–1.20)
BUN: 8.6 mg/dL (ref 7.0–26.0)
CO2: 28 meq/L (ref 22–29)
CREATININE: 0.8 mg/dL (ref 0.6–1.1)
Calcium: 9.2 mg/dL (ref 8.4–10.4)
Chloride: 106 mEq/L (ref 98–109)
EGFR: >90 mL/min/{1.73_m2} (ref >90)
GLUCOSE: 128 mg/dL (ref 70–140)
Potassium: 3.8 mEq/L (ref 3.5–5.1)
SODIUM: 140 meq/L (ref 136–145)
TOTAL PROTEIN: 6.8 g/dL (ref 6.4–8.3)

## 2015-06-26 MED ORDER — PACLITAXEL PROTEIN-BOUND CHEMO INJECTION 100 MG
65.0000 mg/m2 | Freq: Once | INTRAVENOUS | Status: AC
  Administered 2015-06-26: 125 mg via INTRAVENOUS
  Filled 2015-06-26: qty 25

## 2015-06-26 MED ORDER — SODIUM CHLORIDE 0.9 % IJ SOLN
10.0000 mL | INTRAMUSCULAR | Status: DC | PRN
  Administered 2015-06-26: 10 mL
  Filled 2015-06-26: qty 10

## 2015-06-26 MED ORDER — SODIUM CHLORIDE 0.9 % IV SOLN
Freq: Once | INTRAVENOUS | Status: AC
  Administered 2015-06-26: 10:00:00 via INTRAVENOUS
  Filled 2015-06-26: qty 4

## 2015-06-26 MED ORDER — SODIUM CHLORIDE 0.9 % IV SOLN
Freq: Once | INTRAVENOUS | Status: AC
  Administered 2015-06-26: 10:00:00 via INTRAVENOUS

## 2015-06-26 MED ORDER — HEPARIN SOD (PORK) LOCK FLUSH 100 UNIT/ML IV SOLN
500.0000 [IU] | Freq: Once | INTRAVENOUS | Status: AC | PRN
  Administered 2015-06-26: 500 [IU]
  Filled 2015-06-26: qty 5

## 2015-06-26 NOTE — Telephone Encounter (Signed)
inbox to dr [name] to see if a double book is okl on 9/19 due to on call and booked as well.

## 2015-06-26 NOTE — Progress Notes (Signed)
Patient Care Team: [name], MD as PCP - General (Family Medicine) Chauncey Cruel, MD as Consulting Physician (Oncology) Thea Silversmith, MD as Consulting Physician (Radiation Oncology) Mauro Kaufmann, RN as Registered Nurse Rockwell Germany, RN as Registered Nurse Alphonsa Overall, MD as Consulting Physician (General Surgery)  DIAGNOSIS: Breast cancer of upper-outer quadrant of right female breast   Staging form: Breast, AJCC 7th Edition     Clinical stage from 02/08/2015: Stage IIIA (T3, N1, M0) - Unsigned   SUMMARY OF ONCOLOGIC HISTORY:   Breast cancer of upper-outer quadrant of right female breast   01/27/2015 Initial Diagnosis Right breast 9:00 and 10:00 biopsy: Grade 3 IDC ER/PR negative, HER-2 negative, Ki-67 77% and 73%; the right axillary lymph node biopsy: EF 22% positive   01/30/2015 Breast MRI Right breast masses 9 and 10:00 position, additional irregular masses with non-mass enhancement upper outer quadrant with one mass extending into upper inner quadrant, multifocal, multicentric 13.7 x 4.6 x 4.6 cm, malignant right axillary lymph node   02/15/2015 Imaging No evidence of metastatic disease, 2.2 cm enhancing lesion anterior left lower kidney suspicious for solid renal neoplasm   02/20/2015 -  Neo-Adjuvant Chemotherapy Adriamycin and cytoxan dose dense Q 2 weeks X 4 foll by Abraxane weekly x 12    CHIEF COMPLIANT: Abraxane week 10  INTERVAL HISTORY: Phyllis Pruitt is a 44 year old with above-mentioned history of right-sided breast cancer currently on weekly Abraxane. She is tolerating it fairly well. She did notice neuropathy in her feet. The neuropathy in the hands went away. Denies any nausea or vomiting. Moderate fatigue.  REVIEW OF SYSTEMS:   Constitutional: Denies fevers, chills or abnormal weight loss Eyes: Denies blurriness of vision Ears, nose, mouth, throat, and face: Denies mucositis or sore throat Respiratory: Denies cough, dyspnea or wheezes Cardiovascular:  Denies palpitation, chest discomfort or lower extremity swelling Gastrointestinal:  Denies nausea, heartburn or change in bowel habits Skin: Denies abnormal skin rashes Lymphatics: Denies new lymphadenopathy or easy bruising Neurological: mild tingling and numbness of her feet Behavioral/Psych: Mood is stable, no new changes  All other systems were reviewed with the patient and are negative.  I have reviewed the past medical history, past surgical history, social history and family history with the patient and they are unchanged from previous note.  ALLERGIES:  has No Known Allergies.  MEDICATIONS:  Current Outpatient Prescriptions  Medication Sig Dispense Refill  . Atorvastatin Calcium (INVESTIGATIONAL ATORVASTATIN/PLACEBO) 40 MG tablet [facility] [address] Take 1 tablet by mouth daily. Take 2 doses (these doses must be 12 hours apart) prior to first chemotherapy treatment. Then take 1 tablet daily by mouth with or without food. 180 tablet 0  . dexamethasone (DECADRON) 4 MG tablet TAKE 1 TABLET 1X A DAY ON THE DAY AFTER CHEMOTHERAPY& THEN 1 TABLET 2X A DAY X 2 DAYS W/ FOOD  1  . diphenhydramine-acetaminophen (TYLENOL PM) 25-500 MG TABS Take 1 tablet by mouth at bedtime as needed (sleep).     Marland Kitchen ECHINACEA PO Take 1 tablet by mouth daily.     . fluconazole (DIFLUCAN) 100 MG tablet Take 100 mg by mouth daily.  0  . Ginger, Zingiber officinalis, (GINGER ROOT PO) Take 1 tablet by mouth daily.     Marland Kitchen HYDROcodone-acetaminophen (NORCO/VICODIN) 5-325 MG per tablet     . LORazepam (ATIVAN) 0.5 MG tablet Take 1 tablet (0.5 mg total) by mouth at bedtime. 30 tablet 0  . Multiple Vitamin (MULTIVITAMIN WITH MINERALS) TABS tablet Take 1  tablet by mouth daily.    . ondansetron (ZOFRAN) 8 MG tablet TAKE 1 TABLET TWICE A DAY AS NEEDED , START ON THE THIRD DAY AFTER CHEMOTHERAPY  1  . prochlorperazine (COMPAZINE) 10 MG tablet TAKE 1 TABLET (10 MG TOTAL) BY MOUTH EVERY 6 (SIX) HOURS AS NEEDED (NAUSEA OR  VOMITING).  1  . TURMERIC CURCUMIN PO Take 1 capsule by mouth daily.      No current facility-administered medications for this visit.    PHYSICAL EXAMINATION: ECOG PERFORMANCE STATUS: 1 - Symptomatic but completely ambulatory  Filed Vitals:   06/26/15 0844  BP: 149/90  Pulse: 87  Temp: 98.5 F (36.9 C)  Resp: 18   Filed Weights   06/26/15 0844  Weight: 193 lb 1.6 oz (87.59 kg)    GENERAL:alert, no distress and comfortable SKIN: skin color, texture, turgor are normal, no rashes or significant lesions EYES: normal, Conjunctiva are pink and non-injected, sclera clear OROPHARYNX:no exudate, no erythema and lips, buccal mucosa, and tongue normal  NECK: supple, thyroid normal size, non-tender, without nodularity LYMPH:  no palpable lymphadenopathy in the cervical, axillary or inguinal LUNGS: clear to auscultation and percussion with normal breathing effort HEART: regular rate & rhythm and no murmurs and no lower extremity edema ABDOMEN:abdomen soft, non-tender and normal bowel sounds Musculoskeletal:no cyanosis of digits and no clubbing  NEURO: alert & oriented x 3 with fluent speech, grade 1 neuropathy  LABORATORY DATA:  I have reviewed the data as listed   Chemistry      Component Value Date/Time   NA 140 06/26/2015 0831   NA 139 02/14/2015 1547   K 3.8 06/26/2015 0831   K 3.5 02/14/2015 1547   CL 104 02/14/2015 1547   CO2 28 06/26/2015 0831   CO2 24 02/14/2015 1547   BUN 8.6 06/26/2015 0831   BUN 5* 02/14/2015 1547   CREATININE 0.8 06/26/2015 0831   CREATININE 0.65 02/14/2015 1547      Component Value Date/Time   CALCIUM 9.2 06/26/2015 0831   CALCIUM 9.3 02/14/2015 1547   ALKPHOS 69 06/26/2015 0831   ALKPHOS 61 02/14/2015 1547   AST 21 06/26/2015 0831   AST 15 02/14/2015 1547   ALT 27 06/26/2015 0831   ALT 12* 02/14/2015 1547   BILITOT 0.42 06/26/2015 0831   BILITOT 0.7 02/14/2015 1547       Lab Results  Component Value Date   WBC 4.4 06/26/2015    HGB 10.8* 06/26/2015   HCT 32.3* 06/26/2015   MCV 87.9 06/26/2015   PLT 291 06/26/2015   NEUTROABS 3.4 06/26/2015   ASSESSMENT & PLAN:  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 Patient enrolled in PREVENT clinical trial: Lipitor vs Placebo (no toxicities from this study)  Current treatment: Neo-adjuvant chemotherapy completed Dose dense Adriamycin and Cytoxan 4; Today is cycle 10/12 weekly cycles of abraxane   Chemo toxicities: 1. Constipation 2. Heartburn 3. Grade 1 nausea related to constipation 4. Grade 1 anemia: With a  history of sickle cell trait. We will monitor this 5. Left groin infection with pus to that burst open after cycle 2 of AC: Received Diflucan for 7 days with Keflex for 7 days. Resolved. 6. Alopecia 7. Rash on the face which is itching: Resolved with cortisone cream 8. Depression: Because she is still struggling with why she got this cancer. 9. Right vulvar abscess: Prescribed Keflex 500 twice a day for 10 days 05/15/2015 10. Left ear cerumen impaction: likely cause for unilateral hearing loss. Suggested she visit PCP for removal and avoid use of q-tips.  11. Abdominal cramps: With ibuprofen. (I suspect this may be related to an ovarian cyst rupture) it has resolved. 12. Neuropathy grade 1: I discussed with her about reducing the dosage of Abraxane to 65 mg/m  Will return weekly for abraxane. After both cycles, we will plan for breast MRIs and tumor  board discussion and surgery planning. (Patient tells me that she would not like to have surgery during the week of October 17 because her husband would be away in [place].)  Return to clinic in one week to follow-up on the neuropathy No orders of the defined types were placed in this encounter.   The patient has a good understanding of the overall plan. she agrees with it. she will call with any problems that may develop before the next visit here.   Rulon Eisenmenger, MD

## 2015-06-26 NOTE — Assessment & Plan Note (Signed)
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 Patient enrolled in PREVENT clinical trial: Lipitor vs Placebo (no toxicities from this study)  Current treatment: Neo-adjuvant chemotherapy completed Dose dense Adriamycin and Cytoxan 4; Today is cycle 10/12 weekly cycles of abraxane   Chemo toxicities: 1. Constipation 2. Heartburn 3. Grade 1 nausea related to constipation 4. Grade 1 anemia: With a history of sickle cell trait. We will monitor this 5. Left groin infection with pus to that burst open after cycle 2 of AC: Received Diflucan for 7 days with Keflex for 7 days. Resolved. 6. Alopecia 7. Rash on the face which is itching: Resolved with cortisone cream 8. Depression: Because she is still struggling with why she got this cancer. 9. Right vulvar abscess: Prescribed Keflex 500 twice a day for 10 days 05/15/2015 10. Left ear cerumen impaction: likely cause for unilateral hearing loss. Suggested she visit PCP for removal and avoid use of q-tips.  11. Abdominal cramps: With ibuprofen. (I suspect this may be related to an ovarian cyst rupture) it has resolved.  Will return weekly for abraxane and in 2 weeks for her next office visit with cycle 12. After that we will plan for breast MRIs and tumor board discussion and surgery planning. (Patient tells me that she would not like to have surgery during the week of October 17 because her husband would be away in [place].)

## 2015-06-26 NOTE — Patient Instructions (Addendum)
[facility] Discharge Instructions for Patients Receiving Chemotherapy  Today you received the following chemotherapy agents: Abraxane.   To help prevent nausea and vomiting after your treatment, we encourage you to take your nausea medication: Compazine. Take one every 6 hours as needed.  If you develop nausea and vomiting that is not controlled by your nausea medication, call the clinic.   BELOW ARE SYMPTOMS THAT SHOULD BE REPORTED IMMEDIATELY:  *FEVER GREATER THAN 100.5 F  *CHILLS WITH OR WITHOUT FEVER  NAUSEA AND VOMITING THAT IS NOT CONTROLLED WITH YOUR NAUSEA MEDICATION  *UNUSUAL SHORTNESS OF BREATH  *UNUSUAL BRUISING OR BLEEDING  TENDERNESS IN MOUTH AND THROAT WITH OR WITHOUT PRESENCE OF ULCERS  *URINARY PROBLEMS  *BOWEL PROBLEMS  UNUSUAL RASH Items with * indicate a potential emergency and should be followed up as soon as possible.  Feel free to call the clinic should you have any questions or concerns. The clinic phone number is [phone].  Please show the CHEMO ALERT CARD at check-in to the Emergency Department and triage nurse.   

## 2015-06-27 ENCOUNTER — Telehealth: Payer: Self-pay

## 2015-06-27 ENCOUNTER — Telehealth: Payer: Self-pay | Admitting: Hematology and Oncology

## 2015-06-27 MED ORDER — FLUCONAZOLE 100 MG PO TABS: 100.0000 mg | ORAL_TABLET | Freq: Every day | ORAL | Status: DC

## 2015-06-27 NOTE — Telephone Encounter (Signed)
Let pt know diflucan order placed.  Pt voiced understanding.

## 2015-06-27 NOTE — Telephone Encounter (Signed)
Called and left a message with new appointment time for 9/19

## 2015-07-03 ENCOUNTER — Other Ambulatory Visit (HOSPITAL_BASED_OUTPATIENT_CLINIC_OR_DEPARTMENT_OTHER): Payer: 59

## 2015-07-03 ENCOUNTER — Ambulatory Visit (HOSPITAL_BASED_OUTPATIENT_CLINIC_OR_DEPARTMENT_OTHER): Payer: 59

## 2015-07-03 ENCOUNTER — Other Ambulatory Visit: Payer: 59

## 2015-07-03 ENCOUNTER — Ambulatory Visit (HOSPITAL_BASED_OUTPATIENT_CLINIC_OR_DEPARTMENT_OTHER): Payer: 59 | Admitting: Hematology and Oncology

## 2015-07-03 ENCOUNTER — Encounter: Payer: Self-pay | Admitting: Hematology and Oncology

## 2015-07-03 ENCOUNTER — Telehealth: Payer: Self-pay | Admitting: Hematology and Oncology

## 2015-07-03 VITALS — BP 137/86 | HR 82 | Temp 98.5°F | Resp 18 | Ht 65.5 in | Wt 190.8 lb

## 2015-07-03 DIAGNOSIS — C50411 Malignant neoplasm of upper-outer quadrant of right female breast: Secondary | ICD-10-CM

## 2015-07-03 DIAGNOSIS — Z5111 Encounter for antineoplastic chemotherapy: Secondary | ICD-10-CM

## 2015-07-03 LAB — COMPREHENSIVE METABOLIC PANEL (CC13)
ALBUMIN: 3.4 g/dL — AB (ref 3.5–5.0)
ALK PHOS: 68 U/L (ref 40–150)
ALT: 28 U/L (ref 0–55)
ANION GAP: 8 meq/L (ref 3–11)
AST: 20 U/L (ref 5–34)
BILIRUBIN TOTAL: 0.28 mg/dL (ref 0.20–1.20)
BUN: 12.9 mg/dL (ref 7.0–26.0)
CALCIUM: 9.2 mg/dL (ref 8.4–10.4)
CO2: 27 mEq/L (ref 22–29)
CREATININE: 0.8 mg/dL (ref 0.6–1.1)
Chloride: 108 mEq/L (ref 98–109)
EGFR: >90 mL/min/{1.73_m2} (ref >90)
Glucose: 111 mg/dl (ref 70–140)
Potassium: 3.8 mEq/L (ref 3.5–5.1)
Sodium: 143 mEq/L (ref 136–145)
TOTAL PROTEIN: 7 g/dL (ref 6.4–8.3)

## 2015-07-03 LAB — CBC WITH DIFFERENTIAL/PLATELET
BASO%: 0.8 % (ref 0.0–2.0)
BASOS ABS: 0 10*3/uL (ref 0.0–0.1)
EOS ABS: 0.1 10*3/uL (ref 0.0–0.5)
EOS%: 1.2 % (ref 0.0–7.0)
HCT: 32.7 % — ABNORMAL LOW (ref 34.8–46.6)
HGB: 11 g/dL — ABNORMAL LOW (ref 11.6–15.9)
LYMPH%: 15.9 % (ref 14.0–49.7)
MCH: 28.9 pg (ref 25.1–34.0)
MCHC: 33.6 g/dL (ref 31.5–36.0)
MCV: 86.1 fL (ref 79.5–101.0)
MONO#: 0.5 10*3/uL (ref 0.1–0.9)
MONO%: 9 % (ref 0.0–14.0)
NEUT#: 3.7 10*3/uL (ref 1.5–6.5)
NEUT%: 73.1 % (ref 38.4–76.8)
Platelets: 323 10*3/uL (ref 145–400)
RBC: 3.8 10*6/uL (ref 3.70–5.45)
RDW: 14.7 % — ABNORMAL HIGH (ref 11.2–14.5)
WBC: 5.1 10*3/uL (ref 3.9–10.3)
lymph#: 0.8 10*3/uL — ABNORMAL LOW (ref 0.9–3.3)

## 2015-07-03 MED ORDER — SODIUM CHLORIDE 0.9 % IV SOLN
Freq: Once | INTRAVENOUS | Status: AC
  Administered 2015-07-03: 10:00:00 via INTRAVENOUS
  Filled 2015-07-03: qty 4

## 2015-07-03 MED ORDER — HEPARIN SOD (PORK) LOCK FLUSH 100 UNIT/ML IV SOLN
500.0000 [IU] | Freq: Once | INTRAVENOUS | Status: AC | PRN
  Administered 2015-07-03: 500 [IU]
  Filled 2015-07-03: qty 5

## 2015-07-03 MED ORDER — SODIUM CHLORIDE 0.9 % IJ SOLN
10.0000 mL | INTRAMUSCULAR | Status: DC | PRN
  Administered 2015-07-03: 10 mL
  Filled 2015-07-03: qty 10

## 2015-07-03 MED ORDER — PACLITAXEL PROTEIN-BOUND CHEMO INJECTION 100 MG
55.0000 mg/m2 | Freq: Once | INTRAVENOUS | Status: AC
  Administered 2015-07-03: 100 mg via INTRAVENOUS
  Filled 2015-07-03: qty 20

## 2015-07-03 MED ORDER — SODIUM CHLORIDE 0.9 % IV SOLN
Freq: Once | INTRAVENOUS | Status: AC
  Administered 2015-07-03: 09:00:00 via INTRAVENOUS

## 2015-07-03 NOTE — Telephone Encounter (Signed)
Appointments by chemo order,no new pof sent,patient will get a new avs in chemo

## 2015-07-03 NOTE — Progress Notes (Signed)
Patient Care Team: [name], MD as PCP - General (Family Medicine) Chauncey Cruel, MD as Consulting Physician (Oncology) Thea Silversmith, MD as Consulting Physician (Radiation Oncology) Mauro Kaufmann, RN as Registered Nurse Rockwell Germany, RN as Registered Nurse Alphonsa Overall, MD as Consulting Physician (General Surgery)  DIAGNOSIS: Breast cancer of upper-outer quadrant of right female breast   Staging form: Breast, AJCC 7th Edition     Clinical stage from 02/08/2015: Stage IIIA (T3, N1, M0) - Unsigned   SUMMARY OF ONCOLOGIC HISTORY:   Breast cancer of upper-outer quadrant of right female breast   01/27/2015 Initial Diagnosis Right breast 9:00 and 10:00 biopsy: Grade 3 IDC ER/PR negative, HER-2 negative, Ki-67 77% and 73%; the right axillary lymph node biopsy: EF 22% positive   01/30/2015 Breast MRI Right breast masses 9 and 10:00 position, additional irregular masses with non-mass enhancement upper outer quadrant with one mass extending into upper inner quadrant, multifocal, multicentric 13.7 x 4.6 x 4.6 cm, malignant right axillary lymph node   02/15/2015 Imaging No evidence of metastatic disease, 2.2 cm enhancing lesion anterior left lower kidney suspicious for solid renal neoplasm   02/20/2015 -  Neo-Adjuvant Chemotherapy Adriamycin and cytoxan dose dense Q 2 weeks X 4 foll by Abraxane weekly x 12    CHIEF COMPLIANT: neuropathy getting worse, tooth removed by herself  INTERVAL HISTORY: Phyllis Pruitt is a 44 year old lady with above-mentioned history of right breast cancer currently on the adjuvant chemotherapy. She is here today for cycle 11 of Abraxane weekly. She reports a neuropathy which is at the tips of the fingers is slightly worse. It is not accompanied by any pain. It is slightly worse in the right hand than the left. She had a tooth that was slowly breaking away in the left lower [place]. She finally pulled it out. She does not have any pain or discomfort. There is no  bleeding. She denies any fevers or chills. Energy levels are about the same.  REVIEW OF SYSTEMS:   Constitutional: Denies fevers, chills or abnormal weight loss Eyes: Denies blurriness of vision Ears, nose, mouth, throat, and face: Denies mucositis or sore throat Respiratory: Denies cough, dyspnea or wheezes Cardiovascular: Denies palpitation, chest discomfort or lower extremity swelling Gastrointestinal:  Denies nausea, heartburn or change in bowel habits Skin: Denies abnormal skin rashes Lymphatics: Denies new lymphadenopathy or easy bruising Neurological:tingling numbness in the tips of the fingers and toes Behavioral/Psych: Mood is stable, no new changes  Breast:  denies any pain or lumps or nodules in either breasts All other systems were reviewed with the patient and are negative.  I have reviewed the past medical history, past surgical history, social history and family history with the patient and they are unchanged from previous note.  ALLERGIES:  has No Known Allergies.  MEDICATIONS:  Current Outpatient Prescriptions  Medication Sig Dispense Refill  . Atorvastatin Calcium (INVESTIGATIONAL ATORVASTATIN/PLACEBO) 40 MG tablet [facility] [address] Take 1 tablet by mouth daily. Take 2 doses (these doses must be 12 hours apart) prior to first chemotherapy treatment. Then take 1 tablet daily by mouth with or without food. 180 tablet 0  . diphenhydramine-acetaminophen (TYLENOL PM) 25-500 MG TABS Take 1 tablet by mouth at bedtime as needed (sleep).     Marland Kitchen ECHINACEA PO Take 1 tablet by mouth daily.     . fluconazole (DIFLUCAN) 100 MG tablet Take 1 tablet (100 mg total) by mouth daily. 7 tablet 0  . Ginger, Zingiber officinalis, (GINGER ROOT  PO) Take 1 tablet by mouth daily.     Marland Kitchen HYDROcodone-acetaminophen (NORCO/VICODIN) 5-325 MG per tablet     . LORazepam (ATIVAN) 0.5 MG tablet Take 1 tablet (0.5 mg total) by mouth at bedtime. 30 tablet 0  . Multiple Vitamin (MULTIVITAMIN WITH MINERALS)  TABS tablet Take 1 tablet by mouth daily.    . ondansetron (ZOFRAN) 8 MG tablet TAKE 1 TABLET TWICE A DAY AS NEEDED , START ON THE THIRD DAY AFTER CHEMOTHERAPY  1  . prochlorperazine (COMPAZINE) 10 MG tablet TAKE 1 TABLET (10 MG TOTAL) BY MOUTH EVERY 6 (SIX) HOURS AS NEEDED (NAUSEA OR VOMITING).  1  . TURMERIC CURCUMIN PO Take 1 capsule by mouth daily.      No current facility-administered medications for this visit.    PHYSICAL EXAMINATION: ECOG PERFORMANCE STATUS: 1 - Symptomatic but completely ambulatory  Filed Vitals:   07/03/15 0826  BP: 137/86  Pulse: 82  Temp: 98.5 F (36.9 C)  Resp: 18   Filed Weights   07/03/15 0826  Weight: 190 lb 12.8 oz (86.546 kg)    GENERAL:alert, no distress and comfortable SKIN: skin color, texture, turgor are normal, no rashes or significant lesions EYES: normal, Conjunctiva are pink and non-injected, sclera clear OROPHARYNX:no exudate, no erythema and lips, buccal mucosa, and tongue normal  NECK: supple, thyroid normal size, non-tender, without nodularity LYMPH:  no palpable lymphadenopathy in the cervical, axillary or inguinal LUNGS: clear to auscultation and percussion with normal breathing effort HEART: regular rate & rhythm and no murmurs and no lower extremity edema ABDOMEN:abdomen soft, non-tender and normal bowel sounds Musculoskeletal:no cyanosis of digits and no clubbing  NEURO: alert & oriented x 3 with fluent speech, grade 1 peripheral neuropathy   LABORATORY DATA:  I have reviewed the data as listed   Chemistry      Component Value Date/Time   NA 143 07/03/2015 0815   NA 139 02/14/2015 1547   K 3.8 07/03/2015 0815   K 3.5 02/14/2015 1547   CL 104 02/14/2015 1547   CO2 27 07/03/2015 0815   CO2 24 02/14/2015 1547   BUN 12.9 07/03/2015 0815   BUN 5* 02/14/2015 1547   CREATININE 0.8 07/03/2015 0815   CREATININE 0.65 02/14/2015 1547      Component Value Date/Time   CALCIUM 9.2 07/03/2015 0815   CALCIUM 9.3 02/14/2015  1547   ALKPHOS 68 07/03/2015 0815   ALKPHOS 61 02/14/2015 1547   AST 20 07/03/2015 0815   AST 15 02/14/2015 1547   ALT 28 07/03/2015 0815   ALT 12* 02/14/2015 1547   BILITOT 0.28 07/03/2015 0815   BILITOT 0.7 02/14/2015 1547       Lab Results  Component Value Date   WBC 5.1 07/03/2015   HGB 11.0* 07/03/2015   HCT 32.7* 07/03/2015   MCV 86.1 07/03/2015   PLT 323 07/03/2015   NEUTROABS 3.7 07/03/2015   ASSESSMENT & PLAN:  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 Patient  enrolled in PREVENT clinical trial: Lipitor vs Placebo (no toxicities from this study)  Current treatment: Neo-adjuvant chemotherapy completed Dose dense Adriamycin and Cytoxan 4; Today is cycle 11/12 weekly cycles of abraxane   Chemo toxicities: 1. Constipation 2. Heartburn 3. Grade 1 nausea related to constipation 4. Grade 1 anemia: With a history of sickle cell trait. We will monitor this 5. Left groin infection with pus to that burst open after cycle 2 of AC: Received Diflucan for 7 days with Keflex for 7 days. Resolved. 6. Alopecia 7. Rash on the face which is itching: Resolved with cortisone cream 8. Depression: Because she is still struggling with why she got this cancer. 9. Right vulvar abscess: Prescribed Keflex 500 twice a day for 10 days 05/15/2015 10. Left ear cerumen impaction: likely cause for unilateral hearing loss. Suggested she visit PCP for removal and avoid use of q-tips.  11.  Abdominal cramps: With ibuprofen. (I suspect this may be related to an ovarian cyst rupture) it has resolved. 12. Neuropathy grade 1:we reduced the dosage of Abraxane for week 11 of chemotherapy to 55 mg/m.  Will return in 1 week for cycle 12 of abraxane.  we will plan for breast MRIs and tumor board discussion and surgery planning.   (Patient tells me that she would not like to have surgery during the week of October 17 because her husband would be away in [place]. She would like the week before)  Return to clinic to review breast MRI result.   No orders of the defined types were placed in this encounter.   The patient has a good understanding of the overall plan. she agrees with it. she will call with any problems that may develop before the next visit here.   Rulon Eisenmenger, MD

## 2015-07-03 NOTE — Patient Instructions (Signed)
[facility] Discharge Instructions for Patients Receiving Chemotherapy  Today you received the following chemotherapy agents: Abraxane   To help prevent nausea and vomiting after your treatment, we encourage you to take your nausea medication as directed.    If you develop nausea and vomiting that is not controlled by your nausea medication, call the clinic.   BELOW ARE SYMPTOMS THAT SHOULD BE REPORTED IMMEDIATELY:  *FEVER GREATER THAN 100.5 F  *CHILLS WITH OR WITHOUT FEVER  NAUSEA AND VOMITING THAT IS NOT CONTROLLED WITH YOUR NAUSEA MEDICATION  *UNUSUAL SHORTNESS OF BREATH  *UNUSUAL BRUISING OR BLEEDING  TENDERNESS IN MOUTH AND THROAT WITH OR WITHOUT PRESENCE OF ULCERS  *URINARY PROBLEMS  *BOWEL PROBLEMS  UNUSUAL RASH Items with * indicate a potential emergency and should be followed up as soon as possible.  Feel free to call the clinic you have any questions or concerns. The clinic phone number is [phone].  Please show the CHEMO ALERT CARD at check-in to the Emergency Department and triage nurse.   

## 2015-07-03 NOTE — Assessment & Plan Note (Signed)
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 Patient enrolled in PREVENT clinical trial: Lipitor vs Placebo (no toxicities from this study)  Current treatment: Neo-adjuvant chemotherapy completed Dose dense Adriamycin and Cytoxan 4; Today is cycle 11/12 weekly cycles of abraxane   Chemo toxicities: 1. Constipation 2. Heartburn 3. Grade 1 nausea related to constipation 4. Grade 1 anemia: With a history of sickle cell trait. We will monitor this 5. Left groin infection with pus to that burst open after cycle 2 of AC: Received Diflucan for 7 days with Keflex for 7 days. Resolved. 6. Alopecia 7. Rash on the face which is itching: Resolved with cortisone cream 8. Depression: Because she is still struggling with why she got this cancer. 9. Right vulvar abscess: Prescribed Keflex 500 twice a day for 10 days 05/15/2015 10. Left ear cerumen impaction: likely cause for unilateral hearing loss. Suggested she visit PCP for removal and avoid use of q-tips.  11. Abdominal cramps: With ibuprofen. (I suspect this may be related to an ovarian cyst rupture) it has resolved. 12. Neuropathy grade 1: I discussed with her about reducing the dosage of Abraxane to 65 mg/m  Will return weekly for abraxane. After both cycles, we will plan for breast MRIs and tumor board discussion and surgery planning. (Patient tells me that she would not like to have surgery during the week of October 17 because her husband would be away in [place]. She would like the week  before)  Return to clinic in one week to follow-up on the neuropathy.

## 2015-07-10 ENCOUNTER — Ambulatory Visit
Admission: RE | Admit: 2015-07-10 | Discharge: 2015-07-10 | Disposition: A | Discharge status: Home / Self Care | Payer: 59 | Source: Ambulatory Visit | Attending: Hematology and Oncology | Admitting: Hematology and Oncology

## 2015-07-10 ENCOUNTER — Ambulatory Visit (HOSPITAL_BASED_OUTPATIENT_CLINIC_OR_DEPARTMENT_OTHER): Payer: 59

## 2015-07-10 ENCOUNTER — Encounter: Payer: Self-pay | Admitting: *Deleted

## 2015-07-10 ENCOUNTER — Other Ambulatory Visit (HOSPITAL_BASED_OUTPATIENT_CLINIC_OR_DEPARTMENT_OTHER): Payer: 59

## 2015-07-10 VITALS — BP 126/87 | HR 86 | Temp 97.7°F | Resp 18

## 2015-07-10 DIAGNOSIS — C50411 Malignant neoplasm of upper-outer quadrant of right female breast: Secondary | ICD-10-CM

## 2015-07-10 DIAGNOSIS — Z5111 Encounter for antineoplastic chemotherapy: Secondary | ICD-10-CM

## 2015-07-10 LAB — CBC WITH DIFFERENTIAL/PLATELET
BASO%: 1.2 % (ref 0.0–2.0)
Basophils Absolute: 0.1 10*3/uL (ref 0.0–0.1)
EOS%: 1.2 % (ref 0.0–7.0)
Eosinophils Absolute: 0.1 10*3/uL (ref 0.0–0.5)
HEMATOCRIT: 34.1 % — AB (ref 34.8–46.6)
HGB: 11.4 g/dL — ABNORMAL LOW (ref 11.6–15.9)
LYMPH#: 0.8 10*3/uL — AB (ref 0.9–3.3)
LYMPH%: 16.6 % (ref 14.0–49.7)
MCH: 28.7 pg (ref 25.1–34.0)
MCHC: 33.3 g/dL (ref 31.5–36.0)
MCV: 86.1 fL (ref 79.5–101.0)
MONO#: 0.5 10*3/uL (ref 0.1–0.9)
MONO%: 9.7 % (ref 0.0–14.0)
NEUT%: 71.3 % (ref 38.4–76.8)
NEUTROS ABS: 3.5 10*3/uL (ref 1.5–6.5)
PLATELETS: 315 10*3/uL (ref 145–400)
RBC: 3.96 10*6/uL (ref 3.70–5.45)
RDW: 15.2 % — AB (ref 11.2–14.5)
WBC: 5 10*3/uL (ref 3.9–10.3)

## 2015-07-10 LAB — COMPREHENSIVE METABOLIC PANEL (CC13)
ALT: 19 U/L (ref 0–55)
ANION GAP: 6 meq/L (ref 3–11)
AST: 17 U/L (ref 5–34)
Albumin: 3.3 g/dL — ABNORMAL LOW (ref 3.5–5.0)
Alkaline Phosphatase: 74 U/L (ref 40–150)
BUN: 8.2 mg/dL (ref 7.0–26.0)
CALCIUM: 9.4 mg/dL (ref 8.4–10.4)
CHLORIDE: 106 meq/L (ref 98–109)
CO2: 30 meq/L — AB (ref 22–29)
CREATININE: 0.8 mg/dL (ref 0.6–1.1)
EGFR: >90 mL/min/{1.73_m2} (ref >90)
Glucose: 104 mg/dl (ref 70–140)
Potassium: 4.1 mEq/L (ref 3.5–5.1)
Sodium: 141 mEq/L (ref 136–145)
TOTAL PROTEIN: 6.7 g/dL (ref 6.4–8.3)

## 2015-07-10 MED ORDER — HEPARIN SOD (PORK) LOCK FLUSH 100 UNIT/ML IV SOLN
500.0000 [IU] | Freq: Once | INTRAVENOUS | Status: AC | PRN
  Administered 2015-07-10: 500 [IU]
  Filled 2015-07-10: qty 5

## 2015-07-10 MED ORDER — SODIUM CHLORIDE 0.9 % IJ SOLN
10.0000 mL | INTRAMUSCULAR | Status: DC | PRN
  Administered 2015-07-10: 10 mL
  Filled 2015-07-10: qty 10

## 2015-07-10 MED ORDER — SODIUM CHLORIDE 0.9 % IV SOLN
Freq: Once | INTRAVENOUS | Status: AC
  Administered 2015-07-10: 09:00:00 via INTRAVENOUS

## 2015-07-10 MED ORDER — PACLITAXEL PROTEIN-BOUND CHEMO INJECTION 100 MG
55.0000 mg/m2 | Freq: Once | INTRAVENOUS | Status: AC
  Administered 2015-07-10: 100 mg via INTRAVENOUS
  Filled 2015-07-10: qty 20

## 2015-07-10 MED ORDER — SODIUM CHLORIDE 0.9 % IV SOLN
Freq: Once | INTRAVENOUS | Status: AC
  Administered 2015-07-10: 09:00:00 via INTRAVENOUS
  Filled 2015-07-10: qty 4

## 2015-07-10 NOTE — Patient Instructions (Signed)
[facility] Discharge Instructions for Patients Receiving Chemotherapy  Today you received the following chemotherapy agents: Abraxane   To help prevent nausea and vomiting after your treatment, we encourage you to take your nausea medication as directed.    If you develop nausea and vomiting that is not controlled by your nausea medication, call the clinic.   BELOW ARE SYMPTOMS THAT SHOULD BE REPORTED IMMEDIATELY:  *FEVER GREATER THAN 100.5 F  *CHILLS WITH OR WITHOUT FEVER  NAUSEA AND VOMITING THAT IS NOT CONTROLLED WITH YOUR NAUSEA MEDICATION  *UNUSUAL SHORTNESS OF BREATH  *UNUSUAL BRUISING OR BLEEDING  TENDERNESS IN MOUTH AND THROAT WITH OR WITHOUT PRESENCE OF ULCERS  *URINARY PROBLEMS  *BOWEL PROBLEMS  UNUSUAL RASH Items with * indicate a potential emergency and should be followed up as soon as possible.  Feel free to call the clinic you have any questions or concerns. The clinic phone number is [phone].  Please show the CHEMO ALERT CARD at check-in to the Emergency Department and triage nurse.   

## 2015-07-12 ENCOUNTER — Ambulatory Visit (HOSPITAL_BASED_OUTPATIENT_CLINIC_OR_DEPARTMENT_OTHER): Payer: 59 | Admitting: Hematology and Oncology

## 2015-07-12 ENCOUNTER — Other Ambulatory Visit (HOSPITAL_BASED_OUTPATIENT_CLINIC_OR_DEPARTMENT_OTHER): Payer: 59

## 2015-07-12 ENCOUNTER — Ambulatory Visit: Payer: 59

## 2015-07-12 ENCOUNTER — Encounter: Payer: Self-pay | Admitting: Hematology and Oncology

## 2015-07-12 ENCOUNTER — Other Ambulatory Visit: Payer: Self-pay | Admitting: Surgery

## 2015-07-12 VITALS — BP 154/88 | HR 81 | Temp 98.2°F | Resp 18 | Ht 65.5 in | Wt 190.5 lb

## 2015-07-12 DIAGNOSIS — C50411 Malignant neoplasm of upper-outer quadrant of right female breast: Secondary | ICD-10-CM

## 2015-07-12 LAB — CBC WITH DIFFERENTIAL/PLATELET
BASO%: 0.6 % (ref 0.0–2.0)
Basophils Absolute: 0 10*3/uL (ref 0.0–0.1)
EOS%: 0.6 % (ref 0.0–7.0)
Eosinophils Absolute: 0 10*3/uL (ref 0.0–0.5)
HEMATOCRIT: 32.7 % — AB (ref 34.8–46.6)
HGB: 11.1 g/dL — ABNORMAL LOW (ref 11.6–15.9)
LYMPH#: 0.7 10*3/uL — AB (ref 0.9–3.3)
LYMPH%: 13.6 % — ABNORMAL LOW (ref 14.0–49.7)
MCH: 28.6 pg (ref 25.1–34.0)
MCHC: 33.9 g/dL (ref 31.5–36.0)
MCV: 84.3 fL (ref 79.5–101.0)
MONO#: 0.4 10*3/uL (ref 0.1–0.9)
MONO%: 8.3 % (ref 0.0–14.0)
NEUT#: 4.1 10*3/uL (ref 1.5–6.5)
NEUT%: 76.9 % — ABNORMAL HIGH (ref 38.4–76.8)
Platelets: 301 10*3/uL (ref 145–400)
RBC: 3.88 10*6/uL (ref 3.70–5.45)
RDW: 14.4 % (ref 11.2–14.5)
WBC: 5.3 10*3/uL (ref 3.9–10.3)

## 2015-07-12 LAB — COMPREHENSIVE METABOLIC PANEL (CC13)
ALT: 17 U/L (ref 0–55)
AST: 19 U/L (ref 5–34)
Albumin: 3.6 g/dL (ref 3.5–5.0)
Alkaline Phosphatase: 72 U/L (ref 40–150)
Anion Gap: 7 mEq/L (ref 3–11)
BUN: 8 mg/dL (ref 7.0–26.0)
CALCIUM: 9.1 mg/dL (ref 8.4–10.4)
CHLORIDE: 108 meq/L (ref 98–109)
CO2: 27 meq/L (ref 22–29)
CREATININE: 0.7 mg/dL (ref 0.6–1.1)
EGFR: >90 mL/min/{1.73_m2} (ref >90)
GLUCOSE: 96 mg/dL (ref 70–140)
Potassium: 3.7 mEq/L (ref 3.5–5.1)
SODIUM: 141 meq/L (ref 136–145)
Total Bilirubin: 0.3 mg/dL (ref 0.20–1.20)
Total Protein: 7.2 g/dL (ref 6.4–8.3)

## 2015-07-12 NOTE — Assessment & Plan Note (Signed)
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 Patient enrolled in PREVENT clinical trial: Lipitor vs Placebo (no toxicities from this study) Treatment summary: Neo-adjuvant chemotherapy with dose dense Adriamycin and Cytoxan 4 followed by weekly Abraxane times 12 started 02/20/2015 completed 07/10/2015 MRI breast 07/10/2015 post neo-adjuvant chemotherapy: Multiple irregular enhancing masses right breast are less confluent and smaller dominant mass 10:00 2.1 x 1.9 x 1.3 cm; right breast 11:00 mass measures 1.2 x 1.2 x 0.8 cm, decreased right axillary lymph node thickening.  Recommendations from tumor board: 1. Obtain CT of abdomen for reassessment of the renal lesion 2. Evaluation for mastectomy 3. 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 Chemotherapy-induced anemia Depression:  Patient would like to have surgery the week of October 17 Return to clinic after surgery to discuss the results.

## 2015-07-12 NOTE — Progress Notes (Signed)
Patient Care Team: [name], MD as PCP - General (Family Medicine) Chauncey Cruel, MD as Consulting Physician (Oncology) Thea Silversmith, MD as Consulting Physician (Radiation Oncology) Mauro Kaufmann, RN as Registered Nurse Rockwell Germany, RN as Registered Nurse Alphonsa Overall, MD as Consulting Physician (General Surgery)  DIAGNOSIS: Breast cancer of upper-outer quadrant of right female breast   Staging form: Breast, AJCC 7th Edition     Clinical stage from 02/08/2015: Stage IIIA (T3, N1, M0) - Unsigned   SUMMARY OF ONCOLOGIC HISTORY:   Breast cancer of upper-outer quadrant of right female breast   01/27/2015 Initial Diagnosis Right breast 9:00 and 10:00 biopsy: Grade 3 IDC ER/PR negative, HER-2 negative, Ki-67 77% and 73%; the right axillary lymph node biopsy: EF 22% positive   01/30/2015 Breast MRI Right breast masses 9 and 10:00 position, additional irregular masses with non-mass enhancement upper outer quadrant with one mass extending into upper inner quadrant, multifocal, multicentric 13.7 x 4.6 x 4.6 cm, malignant right axillary lymph node   02/15/2015 Imaging No evidence of metastatic disease, 2.2 cm enhancing lesion anterior left lower kidney suspicious for solid renal neoplasm   02/20/2015 -  Neo-Adjuvant Chemotherapy Adriamycin and cytoxan dose dense Q 2 weeks X 4 foll by Abraxane weekly x 12   07/10/2015 Breast MRI Multiple irregular enhancing masses right breast are less confluent and smaller dominant mass 10:00 2.1 x 1.9 x 1.3 cm; right breast 11:00 mass measures 1.2 x 1.2 x 0.8 cm, decreased right axillary lymph node thickening    CHIEF COMPLIANT: Follow-up to discuss breast MRI  INTERVAL HISTORY: Phyllis Pruitt is a 44 year old with above-mentioned history of extensive right breast cancer treated with needle adjuvant chemotherapy and is here today to discuss the results of the post-neo-adjuvant breast MRI. Patient is extremely emotional today. She feels that since she had  chemotherapy, she is mentally prepared for surgery but did not realize the role of radiation as well as the role of plastic surgery. She is not happy that plastic surgery cannot happen until radiation is complete. He does not want to be left with just one breast. She feels that would be devastating to her emotional and physical state. She plans to get a second opinion. She is requesting her surgery to happen week of October 10.  REVIEW OF SYSTEMS:   Constitutional: Denies fevers, chills or abnormal weight loss Eyes: Denies blurriness of vision Ears, nose, mouth, throat, and face: Denies mucositis or sore throat Respiratory: Denies cough, dyspnea or wheezes Cardiovascular: Denies palpitation, chest discomfort or lower extremity swelling Gastrointestinal:  Denies nausea, heartburn or change in bowel habits Skin: Denies abnormal skin rashes Lymphatics: Denies new lymphadenopathy or easy bruising Neurological:Denies numbness, tingling or new weaknesses Behavioral/Psych:Crying and emotionally labile.  All other systems were reviewed with the patient and are negative.  I have reviewed the past medical history, past surgical history, social history and family history with the patient and they are unchanged from previous note.  ALLERGIES:  has No Known Allergies.  MEDICATIONS:  Current Outpatient Prescriptions  Medication Sig Dispense Refill  . Atorvastatin Calcium (INVESTIGATIONAL ATORVASTATIN/PLACEBO) 40 MG tablet [facility] [address] Take 1 tablet by mouth daily. Take 2 doses (these doses must be 12 hours apart) prior to first chemotherapy treatment. Then take 1 tablet daily by mouth with or without food. 180 tablet 0  . diphenhydramine-acetaminophen (TYLENOL PM) 25-500 MG TABS Take 1 tablet by mouth at bedtime as needed (sleep).     Marland Kitchen ECHINACEA  PO Take 1 tablet by mouth daily.     . fluconazole (DIFLUCAN) 100 MG tablet Take 1 tablet (100 mg total) by mouth daily. 7 tablet 0  . Ginger,  Zingiber officinalis, (GINGER ROOT PO) Take 1 tablet by mouth daily.     Marland Kitchen HYDROcodone-acetaminophen (NORCO/VICODIN) 5-325 MG per tablet     . LORazepam (ATIVAN) 0.5 MG tablet Take 1 tablet (0.5 mg total) by mouth at bedtime. 30 tablet 0  . Multiple Vitamin (MULTIVITAMIN WITH MINERALS) TABS tablet Take 1 tablet by mouth daily.    . ondansetron (ZOFRAN) 8 MG tablet TAKE 1 TABLET TWICE A DAY AS NEEDED , START ON THE THIRD DAY AFTER CHEMOTHERAPY  1  . prochlorperazine (COMPAZINE) 10 MG tablet TAKE 1 TABLET (10 MG TOTAL) BY MOUTH EVERY 6 (SIX) HOURS AS NEEDED (NAUSEA OR VOMITING).  1  . TURMERIC CURCUMIN PO Take 1 capsule by mouth daily.      No current facility-administered medications for this visit.    PHYSICAL EXAMINATION: ECOG PERFORMANCE STATUS: 1 - Symptomatic but completely ambulatory  Filed Vitals:   07/12/15 1041  BP: 154/88  Pulse: 81  Temp: 98.2 F (36.8 C)  Resp: 18   Filed Weights   07/12/15 1041  Weight: 190 lb 8 oz (86.41 kg)    GENERAL:alert, no distress and comfortable SKIN: skin color, texture, turgor are normal, no rashes or significant lesions EYES: normal, Conjunctiva are pink and non-injected, sclera clear OROPHARYNX:no exudate, no erythema and lips, buccal mucosa, and tongue normal  NECK: supple, thyroid normal size, non-tender, without nodularity LYMPH:  no palpable lymphadenopathy in the cervical, axillary or inguinal LUNGS: clear to auscultation and percussion with normal breathing effort HEART: regular rate & rhythm and no murmurs and no lower extremity edema ABDOMEN:abdomen soft, non-tender and normal bowel sounds Musculoskeletal:no cyanosis of digits and no clubbing  NEURO: alert & oriented x 3 with fluent speech, no focal motor/sensory deficits  LABORATORY DATA:  I have reviewed the data as listed   Chemistry      Component Value Date/Time   NA 141 07/10/2015 0804   NA 139 02/14/2015 1547   K 4.1 07/10/2015 0804   K 3.5 02/14/2015 1547   CL  104 02/14/2015 1547   CO2 30* 07/10/2015 0804   CO2 24 02/14/2015 1547   BUN 8.2 07/10/2015 0804   BUN 5* 02/14/2015 1547   CREATININE 0.8 07/10/2015 0804   CREATININE 0.65 02/14/2015 1547      Component Value Date/Time   CALCIUM 9.4 07/10/2015 0804   CALCIUM 9.3 02/14/2015 1547   ALKPHOS 74 07/10/2015 0804   ALKPHOS 61 02/14/2015 1547   AST 17 07/10/2015 0804   AST 15 02/14/2015 1547   ALT 19 07/10/2015 0804   ALT 12* 02/14/2015 1547   BILITOT <0.30 07/10/2015 0804   BILITOT 0.7 02/14/2015 1547       Lab Results  Component Value Date   WBC 5.3 07/12/2015   HGB 11.1* 07/12/2015   HCT 32.7* 07/12/2015   MCV 84.3 07/12/2015   PLT 301 07/12/2015   NEUTROABS 4.1 07/12/2015     RADIOGRAPHIC STUDIES: I have personally reviewed the radiology reports and agreed with their findings. Mr Breast Bilateral W Wo Contrast  07/10/2015   CLINICAL DATA:  Biopsy proven multicentric right breast cancer and positive metastatic right axillary lymph node. Assess response to neoadjuvant chemotherapy.  LABS:  Not obtained  EXAM: BILATERAL BREAST MRI WITH AND WITHOUT CONTRAST  TECHNIQUE: Multiplanar, multisequence MR images  of both breasts were obtained prior to and following the intravenous administration of 18 ml of MultiHance.  THREE-DIMENSIONAL MR IMAGE RENDERING ON INDEPENDENT WORKSTATION:  Three-dimensional MR images were rendered by post-processing of the original MR data on an independent workstation. The three-dimensional MR images were interpreted, and findings are reported in the following complete MRI report for this study. Three dimensional images were evaluated at the independent DynaCad workstation  COMPARISON:  Prior MRI 01/30/2015 and previous prior imaging  FINDINGS: Breast composition: b. Scattered fibroglandular tissue.  Background parenchymal enhancement: Mild  Right breast: Multiple irregular enhancing masses predominantly in the right breast upper outer quadrant are individually  less confluent and smaller than previously, although direct comparison measurements cannot be accurately performed because many of these masses were not as discretely identifiable on the prior exam as they are today. A dominant mass in the right breast 10 o'clock location middle depth now measures 2.1 x 1.9 x 1.3 cm and demonstrates plateau type enhancing kinetics.  A representative right breast far posterior 11 o'clock location mass now measures 1.2 x 1.2 x 0.8 cm.  Left breast: No mass or abnormal enhancement.  Lymph nodes: Abnormal right axillary nodal cortical thickening is identified measuring 7 mm in maximal thickness. This is decreased from previously. No new left axillary lymphadenopathy.  Ancillary findings:  Left-sided Port-A-Cath is in place.  IMPRESSION: Decreased confluence and individual size of multiple irregular right breast masses, predominantly in the upper outer quadrant, with dominant lesions measured above, compatible with response to treatment.  Decreased size of metastatic right axillary lymphadenopathy.  No new evidence for malignancy in the left breast.  RECOMMENDATION: Treatment plan  BI-RADS CATEGORY  6: Known biopsy-proven malignancy.   Electronically Signed   By: Conchita Paris M.D.   On: 07/10/2015 16:52     ASSESSMENT & PLAN:  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 Patient enrolled in PREVENT clinical trial: Lipitor vs Placebo (no toxicities from this  study)  Treatment summary: Neo-adjuvant chemotherapy with dose dense Adriamycin and Cytoxan 4 followed by weekly Abraxane times 12 started 02/20/2015 completed 07/10/2015  MRI breast 07/10/2015 post neo-adjuvant chemotherapy: Multiple irregular enhancing masses right breast are less confluent and smaller dominant mass 10:00 2.1 x 1.9 x 1.3 cm; right breast 11:00 mass measures 1.2 x 1.2 x 0.8 cm, decreased right axillary lymph node thickening.  Recommendations from tumor board: 1. Obtain CT of abdomen for reassessment of the renal lesion 2. Evaluation for mastectomy 3. 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 Chemotherapy-induced anemia  Depression: Patient was crying today in the office and emotionally labile. We spent a lot of time counseling her. I explained to her that surgery is essential to her treatment. She might need additional counseling to help her get through this process. Her major mental difficulty is regarding reconstruction. She does not want to wait until radiation is completed for her reconstruction to begin.  She  is taking her medical records so that she can get a second opinion. We will make all arrangements for her to get her records.   Patient would like to have surgery the week of October 17 Return to clinic after surgery to discuss the results.   No orders of the defined types were placed in this encounter.   The patient has a good understanding of the overall plan. she agrees with it. she will call with any problems that may develop before the next visit here.   Rulon Eisenmenger, MD

## 2015-07-13 ENCOUNTER — Telehealth: Payer: Self-pay | Admitting: Hematology and Oncology

## 2015-07-13 ENCOUNTER — Other Ambulatory Visit: Payer: Self-pay | Admitting: *Deleted

## 2015-07-13 DIAGNOSIS — C50411 Malignant neoplasm of upper-outer quadrant of right female breast: Secondary | ICD-10-CM

## 2015-07-13 NOTE — Telephone Encounter (Signed)
Left message for patient that medical records were available for pick up

## 2015-07-17 ENCOUNTER — Telehealth: Payer: Self-pay | Admitting: *Deleted

## 2015-07-17 NOTE — Telephone Encounter (Signed)
Spoke with patient today about a follow up appointment with Dr. [name] after her CT scan.  She stated she was told it was going to cost $700 and she does not want to have it done right now.  She has had to pay for surgery and she states " My focus right now is my breast cancer and having surgery and so I have cancelled it".   Offered support and encouraged her to call with any other needs or concerns.

## 2015-07-20 ENCOUNTER — Ambulatory Visit (HOSPITAL_COMMUNITY): Payer: 59

## 2015-07-25 NOTE — Pre-Procedure Instructions (Signed)
DAVETTA OLLIFF  07/25/2015      [id number] [name], [place] - [place] [address] Phone: [phone] Fax: [facility] - [place], [address] [place] [address] Phone: [phone] Fax: [phone]    Your procedure is scheduled on Fri, Oct 14 @ 10:00 AM  Report to [facility] Admitting at 8:00 AM  Call this number if you have problems the morning of surgery:  [phone]   Remember:  Do not eat food or drink liquids after midnight.  Take these medicines the morning of surgery with A SIP OF WATER              Stop taking your Vitamins and any Herbal Medications. No Goody's,BC's,Aleve,Aspirin,Ibuprofen,Fish Oil,or any Herbal Medications.    Do not wear jewelry, make-up or nail polish.  Do not wear lotions, powders, or perfumes.    Do not shave 48 hours prior to surgery.    Do not bring valuables to the hospital.  [facility] is not responsible for any belongings or valuables.  Contacts, dentures or bridgework may not be worn into surgery.  Leave your suitcase in the car.  After surgery it may be brought to your room.  For patients admitted to the hospital, discharge time will be determined by your treatment team.  Patients discharged the day of surgery will not be allowed to drive home.    Special instructions:  [facility] - Preparing for Surgery  Before surgery, you can play an important role.  Because skin is not sterile, your skin needs to be as free of germs as possible.  You can reduce the number of germs on you skin by washing with CHG (chlorahexidine gluconate) soap before surgery.  CHG is an antiseptic cleaner which kills germs and bonds with the skin to continue killing germs even after washing.  Please DO NOT use if you have an allergy to CHG or antibacterial soaps.  If your skin becomes  reddened/irritated stop using the CHG and inform your nurse when you arrive at Short Stay.  Do not shave (including legs and underarms) for at least 48 hours prior to the first CHG shower.  You may shave your face.  Please follow these instructions carefully:   1.  Shower with CHG Soap the night before surgery and the                                morning of Surgery.  2.  If you choose to wash your hair, wash your hair first as usual with your       normal shampoo.  3.  After you shampoo, rinse your hair and body thoroughly to remove the                      Shampoo.  4.  Use CHG as you would any other liquid soap.  You can apply chg directly       to the skin and wash gently with scrungie or a clean washcloth.  5.  Apply the CHG Soap to your body ONLY FROM THE NECK DOWN.        Do not use on open wounds or open sores.  Avoid contact with your eyes,  ears, mouth and genitals (private parts).  Wash genitals (private parts)       with your normal soap.  6.  Wash thoroughly, paying special attention to the area where your surgery        will be performed.  7.  Thoroughly rinse your body with warm water from the neck down.  8.  DO NOT shower/wash with your normal soap after using and rinsing off       the CHG Soap.  9.  Pat yourself dry with a clean towel.            10.  Wear clean pajamas.            11.  Place clean sheets on your bed the night of your first shower and do not        sleep with pets.  Day of Surgery  Do not apply any lotions/deoderants the morning of surgery.  Please wear clean clothes to the hospital/surgery center.    Please read over the following fact sheets that you were given. Pain Booklet, Coughing and Deep Breathing and Surgical Site Infection Prevention

## 2015-07-26 ENCOUNTER — Other Ambulatory Visit: Payer: Self-pay | Admitting: Surgery

## 2015-07-26 ENCOUNTER — Encounter (HOSPITAL_COMMUNITY): Payer: Self-pay

## 2015-07-26 ENCOUNTER — Encounter (HOSPITAL_COMMUNITY)
Admission: RE | Admit: 2015-07-26 | Discharge: 2015-07-26 | Disposition: A | Discharge status: Home / Self Care | Payer: 59 | Source: Ambulatory Visit | Attending: Surgery | Admitting: Surgery

## 2015-07-26 DIAGNOSIS — C50411 Malignant neoplasm of upper-outer quadrant of right female breast: Secondary | ICD-10-CM | POA: Diagnosis present

## 2015-07-26 DIAGNOSIS — F329 Major depressive disorder, single episode, unspecified: Secondary | ICD-10-CM | POA: Diagnosis not present

## 2015-07-26 HISTORY — DX: Depression, unspecified: F32.A

## 2015-07-26 HISTORY — DX: Major depressive disorder, single episode, unspecified: F32.9

## 2015-07-26 HISTORY — DX: Polyneuropathy, unspecified: G62.9

## 2015-07-26 HISTORY — DX: Sickle-cell trait: D57.3

## 2015-07-26 LAB — CBC WITH DIFFERENTIAL/PLATELET
Basophils Absolute: 0 10*3/uL (ref 0.0–0.1)
Basophils Relative: 0 %
EOS PCT: 1 %
Eosinophils Absolute: 0.1 10*3/uL (ref 0.0–0.7)
HEMATOCRIT: 34 % — AB (ref 36.0–46.0)
HEMOGLOBIN: 11.4 g/dL — AB (ref 12.0–15.0)
LYMPHS ABS: 1 10*3/uL (ref 0.7–4.0)
LYMPHS PCT: 15 %
MCH: 27.7 pg (ref 26.0–34.0)
MCHC: 33.5 g/dL (ref 30.0–36.0)
MCV: 82.7 fL (ref 78.0–100.0)
Monocytes Absolute: 0.5 10*3/uL (ref 0.1–1.0)
Monocytes Relative: 8 %
NEUTROS PCT: 76 %
Neutro Abs: 5 10*3/uL (ref 1.7–7.7)
Platelets: 264 10*3/uL (ref 150–400)
RBC: 4.11 MIL/uL (ref 3.87–5.11)
RDW: 14.5 % (ref 11.5–15.5)
WBC: 6.6 10*3/uL (ref 4.0–10.5)

## 2015-07-26 LAB — COMPREHENSIVE METABOLIC PANEL
ALK PHOS: 68 U/L (ref 38–126)
ALT: 23 U/L (ref 14–54)
AST: 21 U/L (ref 15–41)
Albumin: 3.3 g/dL — ABNORMAL LOW (ref 3.5–5.0)
Anion gap: 5 (ref 5–15)
BILIRUBIN TOTAL: 0.5 mg/dL (ref 0.3–1.2)
BUN: 6 mg/dL (ref 6–20)
CALCIUM: 9 mg/dL (ref 8.9–10.3)
CO2: 30 mmol/L (ref 22–32)
CREATININE: 0.68 mg/dL (ref 0.44–1.00)
Chloride: 104 mmol/L (ref 101–111)
Glucose, Bld: 113 mg/dL — ABNORMAL HIGH (ref 65–99)
Potassium: 3.4 mmol/L — ABNORMAL LOW (ref 3.5–5.1)
Sodium: 139 mmol/L (ref 135–145)
Total Protein: 7.3 g/dL (ref 6.5–8.1)

## 2015-07-26 NOTE — Progress Notes (Addendum)
PCP - Dr. [name] Cardiologist - denies  EKG- 02/14/15 - Epic CXR - 02/14/15 - Epic  [id number] - Epic Stress Test/Cardiac Cath - pt. Denies  Patient denies shortness of breath and chest pain at PAT appointment.    Patient refused to take surgical scrub and stated that she has a large bottle at home.

## 2015-07-26 NOTE — Progress Notes (Signed)
Spoke with Sunday Spillers at Dr. Pollie Friar office who stated that she would notify Dr. Lucia Gaskins about the patient's concerns.

## 2015-07-26 NOTE — Progress Notes (Signed)
Patient refuses to sign consent because she states that the port is supposed to be removed as well.  While meeting with PAT nurse, patient attempted to call Dr. [name].  Nurse will call Dr. Pollie Friar office once they open to clarify.  Patient became tearful when she mentioned the port was to be removed.  She stated that she was told there is a spot on the kidney but they have not discussed treatment at this time and she wants the port out now and will cross the bridge about the kidney when she get there.

## 2015-07-27 MED ORDER — CHLORHEXIDINE GLUCONATE 4 % EX LIQD: 1.0000 "application " | Freq: Once | CUTANEOUS | Status: DC

## 2015-07-27 MED ORDER — HEPARIN SODIUM (PORCINE) 5000 UNIT/ML IJ SOLN
5000.0000 [IU] | INTRAMUSCULAR | Status: AC
  Administered 2015-07-28: 5000 [IU] via SUBCUTANEOUS
  Filled 2015-07-27: qty 1

## 2015-07-27 MED ORDER — CEFAZOLIN SODIUM-DEXTROSE 2-3 GM-% IV SOLR
2.0000 g | INTRAVENOUS | Status: AC
  Administered 2015-07-28: 2 g via INTRAVENOUS
  Filled 2015-07-27: qty 50

## 2015-07-27 NOTE — H&P (Signed)
[facility]  Location: [facility] Patient #: [id number] DOB: 04/17/1971 Married / Language: English / Race: White Female  History of Present Illness   Patient words: PAC removal.   The patient is a 44 year old female who presents with breast cancer.   Her primary care physician is Phyllis Casco, MD  She was seen at the [facility] for right breast cancer.   Oncology - Phyllis Pruitt and Phyllis Pruitt. Her Husband, Phyllis Pruitt, is with her.   She has completed chemotx and her tumor has responded to the treatment. She had an MRI on 07/10/2015 which showed response to chemotx, but not complete response. I gave her a copy of the MRI. I discussed with her right MRM. I also talked about rad tx. She was a little shocked that she would need both. I reviewed with her the reason for this. I gave her a pamphlet on breast surgery. I reviewed the operation and post op course. I offered for her to see Dr. Pablo Pruitt again, but for now, she is not interested. She is a little more anxious than I remember her last time.   She has seen Dr. Iran Pruitt. But I did not know that, but I got Dr. Para Pruitt notes. I think that reconstruction will have to wait until after her treatment. [I obtained Dr. Para Pruitt notes after she left. She discussed DIEP vs TRAM with patient. She did not want to do anything with planned Rtx]  Of note, her husband leaves for [place] on Oct 26 and is gone 2 weeks. I want to wait at least two weeks from her last chemotx (9/26) before her surgery. She also was found to have a left renal mass in her workup. She has seen a urologist, who is following this. She cannot remember the name of the urologist.  History of breast cancer (01/2015): She got sick in March. She noticed a lump in her right breast around 12/22/2014. She had never had mammograms before. She had no breast problems before she got sick. She attributed the changes in her  breast to her illness first. I think that she had some dental issues.  She had mammograms through [facility] on 01/19/2015. She had at least 6 lesions identified. And her right axillary node was abnormal. Her breast MRI - 01/30/2015 - Masses compatible with biopsy-proven invasive ductal carcinoma with clip artifact at the 9 and 10 o'clock positions of the right breast. Approximately 6 additional enhancing irregular masses with associated non mass enhancement in the upper-outer quadrant of the right breast with 1 mass extending into the upper inner quadrant. The most anterior mass lies in the upper-outer right retroareolar region. The non mass enhancement extends into the outer lower quadrant. Findings likely represent multifocal, multicentric disease as these abnormalities measure approximately 13.7 x 4.6 x 4.6 cm together. Abnormal biopsy-proven malignant right axillary lymph node. Pathology - IDC, Grade III, ER/PR - neg, Her2Neu neg, Ki67 - 77%. Right axillary node positive.  This was her first mammogram. She sees Phyllis Pruitt for GYN. He has also put her on Phentermine for weight loss. This worked well, but she is off the Phentermine now. Her LMP was 01/19/2015. She is not on hormone meds.  I discussed the options for breast cancer treatment with the patient. I discussed the surgical options of lumpectomy vs. mastectomy. She will need a mastectomy. I discussed axillary node dissection, which I think needs to be done because of persistance as seen on the MRI.  The risks  of surgery include, but are not limited to, bleeding, infection, the need for further surgery, and nerve injury. The patient has been given literature on the treatment of breast cancer. One question is whether to take the power port out - she will ask Dr. Lindi Pruitt. I would tempted to leave it and remove the port at a later date.  Past Medical History: No significant medical issue.  SOCIAL and  FAMILY HISTORY: Husband, Phyllis Pruitt, in room with patient. He works for [employer] for [employer]. They were married in May 2016. She has 4 children: 49 yo daughter, 37 yo daughter (at [employer] [facility]), 42 yo son, and 56 yo son. 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   Addendum Note Phyllis Pruitt. Phyllis Gaskins MD; 03/07/2015 1:58 PM) Genetics were negative. DN 03/07/2015  Allergies Malachi Bonds, CMA; 07/12/2015 8:44 AM) No Known Drug Allergies09/28/2016  Medication History Malachi Bonds, CMA; 07/12/2015 8:46 AM) Atorvastatin Calcium (40MG  Tablet, Oral) Active. Ginger Root (250MG  Capsule, Oral) Active. Multivitamin Adult (Oral) Active. LORazepam (0.5MG  Tablet, Oral) Active. Compazine (10MG  Tablet, Oral) Active. Turmeric Curcumin (Oral) Active. Zofran (8MG  Tablet, Oral) Active. Medications Reconciled  Vitals (Chemira Jones CMA; 07/12/2015 8:43 AM) 07/12/2015 8:42 AM Weight: 191.2 lb Temp.: 98.33F(Oral)  Pulse: 74 (Regular)  BP: 138/74 (Sitting, Left Arm, Standard)  Physical Exam  General: WN AA F alert. Her hair is short from the chemox. HEENT: Normal. Pupils equal.  Neck: Supple. No mass. No thyroid mass. Lymph Nodes: No supraclavicular or cervical nodes.  Lungs: Clear to auscultation and symmetric breath sounds. Heart: RRR. No murmur or rub.  Breasts: right - has a mass effect at 10 o'clock, but this is better than when I last saw her. Left - power port in UIQ. No mass  Abdomen: Soft. No mass. No tenderness. No hernia. Normal bowel sounds. No abdominal scars.  Extremities: Good strength and ROM in upper and lower extremities.   Assessment & Plan  1.  BREAST CANCER, STAGE 3, RIGHT (C50.911)  Story: Abnormal biopsy-proven malignant right axillary lymph node.  Pathology - IDC, Grade III, ER/PR - neg, Her2Neu neg, Ki67 - 77%.   Completed chemotx  treating oncology - Phyllis Pruitt and Phyllis Pruitt  Plan:   1)  Right MRM   Discussed surgery and recovery.  2.  KIDNEY LESION, NATIVE, LEFT (N28.9)  Impression: For follow up in Nov 2016  Review of [facility] notes: Got a note from Dr. Alyson Ingles (03/10/2015), [facility], that there was a planned biopsy. But I do not find anything in Epic and this is not what the patient told me.  3.  Power Port - The patient has completed chemotx. She wants her port out at the same time. Will do this. 4.  Depression - from all that has gone on  Alphonsa Overall, MD, [facility] Pager: [phone] Office phone:  [phone]

## 2015-07-28 ENCOUNTER — Ambulatory Visit (HOSPITAL_COMMUNITY): Payer: 59 | Admitting: Anesthesiology

## 2015-07-28 ENCOUNTER — Observation Stay (HOSPITAL_COMMUNITY)
Admission: RE | Admit: 2015-07-28 | Discharge: 2015-07-29 | Disposition: A | Discharge status: Home / Self Care | Payer: 59 | Source: Ambulatory Visit | Attending: Surgery | Admitting: Surgery

## 2015-07-28 ENCOUNTER — Encounter (HOSPITAL_COMMUNITY): Payer: Self-pay | Admitting: *Deleted

## 2015-07-28 ENCOUNTER — Encounter (HOSPITAL_COMMUNITY)
Admission: RE | Disposition: A | Discharge status: Home / Self Care | Payer: Self-pay | Source: Ambulatory Visit | Attending: Surgery

## 2015-07-28 DIAGNOSIS — F329 Major depressive disorder, single episode, unspecified: Secondary | ICD-10-CM | POA: Insufficient documentation

## 2015-07-28 DIAGNOSIS — C50411 Malignant neoplasm of upper-outer quadrant of right female breast: Secondary | ICD-10-CM | POA: Diagnosis not present

## 2015-07-28 DIAGNOSIS — C50919 Malignant neoplasm of unspecified site of unspecified female breast: Secondary | ICD-10-CM | POA: Diagnosis present

## 2015-07-28 HISTORY — PX: MASTECTOMY MODIFIED RADICAL: SHX5962

## 2015-07-28 HISTORY — PX: PORT-A-CATH REMOVAL: SHX5289

## 2015-07-28 SURGERY — MASTECTOMY, MODIFIED RADICAL
Anesthesia: General | Site: Chest | Laterality: Right

## 2015-07-28 MED ORDER — 0.9 % SODIUM CHLORIDE (POUR BTL) OPTIME
TOPICAL | Status: DC | PRN
  Administered 2015-07-28: 1000 mL

## 2015-07-28 MED ORDER — HYDROCODONE-ACETAMINOPHEN 5-325 MG PO TABS: 1.0000 | ORAL_TABLET | ORAL | Status: DC | PRN

## 2015-07-28 MED ORDER — FENTANYL CITRATE (PF) 100 MCG/2ML IJ SOLN
INTRAMUSCULAR | Status: DC | PRN
  Administered 2015-07-28 (×2): 50 ug via INTRAVENOUS
  Administered 2015-07-28 (×2): 100 ug via INTRAVENOUS
  Administered 2015-07-28 (×3): 50 ug via INTRAVENOUS

## 2015-07-28 MED ORDER — LIDOCAINE HCL (CARDIAC) 20 MG/ML IV SOLN
INTRAVENOUS | Status: DC | PRN
  Administered 2015-07-28: 60 mg via INTRAVENOUS

## 2015-07-28 MED ORDER — KCL IN DEXTROSE-NACL 20-5-0.45 MEQ/L-%-% IV SOLN
INTRAVENOUS | Status: AC
  Administered 2015-07-28: 13:00:00
  Filled 2015-07-28: qty 1000

## 2015-07-28 MED ORDER — PROPOFOL 10 MG/ML IV BOLUS
INTRAVENOUS | Status: AC
  Filled 2015-07-28: qty 20

## 2015-07-28 MED ORDER — LIDOCAINE HCL (CARDIAC) 20 MG/ML IV SOLN
INTRAVENOUS | Status: AC
  Filled 2015-07-28: qty 5

## 2015-07-28 MED ORDER — FENTANYL CITRATE (PF) 250 MCG/5ML IJ SOLN
INTRAMUSCULAR | Status: AC
  Filled 2015-07-28: qty 5

## 2015-07-28 MED ORDER — IBUPROFEN 600 MG PO TABS: 600.0000 mg | ORAL_TABLET | Freq: Four times a day (QID) | ORAL | Status: DC | PRN

## 2015-07-28 MED ORDER — HEPARIN SODIUM (PORCINE) 5000 UNIT/ML IJ SOLN
5000.0000 [IU] | Freq: Three times a day (TID) | INTRAMUSCULAR | Status: DC
  Administered 2015-07-28 – 2015-07-29 (×2): 5000 [IU] via SUBCUTANEOUS
  Filled 2015-07-28 (×2): qty 1

## 2015-07-28 MED ORDER — PHENYLEPHRINE HCL 10 MG/ML IJ SOLN
INTRAMUSCULAR | Status: DC | PRN
  Administered 2015-07-28: 80 ug via INTRAVENOUS

## 2015-07-28 MED ORDER — ONDANSETRON HCL 4 MG/2ML IJ SOLN: 4.0000 mg | Freq: Four times a day (QID) | INTRAMUSCULAR | Status: DC | PRN

## 2015-07-28 MED ORDER — HYDROMORPHONE HCL 1 MG/ML IJ SOLN: 0.2500 mg | INTRAMUSCULAR | Status: DC | PRN

## 2015-07-28 MED ORDER — ONDANSETRON 4 MG PO TBDP: 4.0000 mg | ORAL_TABLET | Freq: Four times a day (QID) | ORAL | Status: DC | PRN

## 2015-07-28 MED ORDER — ONDANSETRON HCL 4 MG/2ML IJ SOLN
INTRAMUSCULAR | Status: AC
  Filled 2015-07-28: qty 2

## 2015-07-28 MED ORDER — DEXAMETHASONE SODIUM PHOSPHATE 4 MG/ML IJ SOLN
INTRAMUSCULAR | Status: AC
  Filled 2015-07-28: qty 1

## 2015-07-28 MED ORDER — MIDAZOLAM HCL 2 MG/2ML IJ SOLN
INTRAMUSCULAR | Status: AC
  Filled 2015-07-28: qty 4

## 2015-07-28 MED ORDER — PROPOFOL 10 MG/ML IV BOLUS
INTRAVENOUS | Status: DC | PRN
  Administered 2015-07-28: 50 mg via INTRAVENOUS
  Administered 2015-07-28: 150 mg via INTRAVENOUS
  Administered 2015-07-28: 50 mg via INTRAVENOUS

## 2015-07-28 MED ORDER — PHENYLEPHRINE 40 MCG/ML (10ML) SYRINGE FOR IV PUSH (FOR BLOOD PRESSURE SUPPORT)
PREFILLED_SYRINGE | INTRAVENOUS | Status: AC
  Filled 2015-07-28: qty 10

## 2015-07-28 MED ORDER — ONDANSETRON HCL 4 MG/2ML IJ SOLN
INTRAMUSCULAR | Status: DC | PRN
  Administered 2015-07-28: 4 mg via INTRAVENOUS

## 2015-07-28 MED ORDER — FENTANYL CITRATE (PF) 100 MCG/2ML IJ SOLN
INTRAMUSCULAR | Status: AC
  Filled 2015-07-28: qty 2

## 2015-07-28 MED ORDER — EPHEDRINE SULFATE 50 MG/ML IJ SOLN
INTRAMUSCULAR | Status: DC | PRN
  Administered 2015-07-28: 5 mg via INTRAVENOUS

## 2015-07-28 MED ORDER — KCL IN DEXTROSE-NACL 20-5-0.45 MEQ/L-%-% IV SOLN
INTRAVENOUS | Status: DC
  Administered 2015-07-28: 75 mL/h via INTRAVENOUS
  Administered 2015-07-29: 1 mL via INTRAVENOUS
  Filled 2015-07-28: qty 1000

## 2015-07-28 MED ORDER — MORPHINE SULFATE (PF) 2 MG/ML IV SOLN
1.0000 mg | INTRAVENOUS | Status: DC | PRN
  Administered 2015-07-28: 2 mg via INTRAVENOUS
  Filled 2015-07-28: qty 1

## 2015-07-28 MED ORDER — MIDAZOLAM HCL 2 MG/2ML IJ SOLN
INTRAMUSCULAR | Status: AC
  Filled 2015-07-28: qty 2

## 2015-07-28 MED ORDER — MIDAZOLAM HCL 5 MG/5ML IJ SOLN
INTRAMUSCULAR | Status: DC | PRN
  Administered 2015-07-28: 50 mg via INTRAVENOUS
  Administered 2015-07-28: 2 mg via INTRAVENOUS

## 2015-07-28 MED ORDER — LACTATED RINGERS IV SOLN
INTRAVENOUS | Status: DC
  Administered 2015-07-28 (×3): via INTRAVENOUS

## 2015-07-28 MED ORDER — DEXAMETHASONE SODIUM PHOSPHATE 4 MG/ML IJ SOLN
INTRAMUSCULAR | Status: DC | PRN
  Administered 2015-07-28: 4 mg via INTRAVENOUS

## 2015-07-28 SURGICAL SUPPLY — 59 items
APL SKNCLS STERI-STRIP NONHPOA (GAUZE/BANDAGES/DRESSINGS) ×2
APPLIER CLIP 9.375 MED OPEN (MISCELLANEOUS) ×4
APR CLP MED 9.3 20 MLT OPN (MISCELLANEOUS) ×2
ATCH SMKEVC FLXB CAUT HNDSWH (FILTER) ×2 IMPLANT
BENZOIN TINCTURE PRP APPL 2/3 (GAUZE/BANDAGES/DRESSINGS) ×4 IMPLANT
BINDER BREAST LRG (GAUZE/BANDAGES/DRESSINGS) IMPLANT
BINDER BREAST XLRG (GAUZE/BANDAGES/DRESSINGS) ×2 IMPLANT
BIOPATCH WHT 1IN DISK W/4.0 H (GAUZE/BANDAGES/DRESSINGS) ×2 IMPLANT
CANISTER SUCTION 2500CC (MISCELLANEOUS) ×4 IMPLANT
CLIP APPLIE 9.375 MED OPEN (MISCELLANEOUS) ×2 IMPLANT
CLOSURE WOUND 1/2 X4 (GAUZE/BANDAGES/DRESSINGS) ×1
CLOSURE WOUND 1/4X4 (GAUZE/BANDAGES/DRESSINGS) ×1
CONT SPECI 4OZ STER CLIK (MISCELLANEOUS) ×2 IMPLANT
COVER SURGICAL LIGHT HANDLE (MISCELLANEOUS) ×4 IMPLANT
DRAIN CHANNEL 19F RND (DRAIN) ×8 IMPLANT
DRAPE CHEST BREAST 15X10 FENES (DRAPES) ×4 IMPLANT
DRAPE PROXIMA HALF (DRAPES) ×4 IMPLANT
DRAPE UTILITY XL STRL (DRAPES) ×8 IMPLANT
DRSG PAD ABDOMINAL 8X10 ST (GAUZE/BANDAGES/DRESSINGS) ×4 IMPLANT
ELECT CAUTERY BLADE 6.4 (BLADE) ×4 IMPLANT
ELECT REM PT RETURN 9FT ADLT (ELECTROSURGICAL) ×4
ELECTRODE REM PT RTRN 9FT ADLT (ELECTROSURGICAL) ×2 IMPLANT
EVACUATOR SILICONE 100CC (DRAIN) ×8 IMPLANT
EVACUATOR SMOKE ACCUVAC VALLEY (FILTER) ×2
GAUZE SPONGE 4X4 12PLY STRL (GAUZE/BANDAGES/DRESSINGS) ×4 IMPLANT
GLOVE BIO SURGEON STRL SZ 6.5 (GLOVE) ×2 IMPLANT
GLOVE BIO SURGEONS STRL SZ 6.5 (GLOVE) ×2
GLOVE BIOGEL PI IND STRL 6.5 (GLOVE) IMPLANT
GLOVE BIOGEL PI INDICATOR 6.5 (GLOVE) ×4
GLOVE SURG SIGNA 7.5 PF LTX (GLOVE) ×4 IMPLANT
GOWN STRL REUS W/ TWL LRG LVL3 (GOWN DISPOSABLE) ×2 IMPLANT
GOWN STRL REUS W/ TWL XL LVL3 (GOWN DISPOSABLE) ×2 IMPLANT
GOWN STRL REUS W/TWL LRG LVL3 (GOWN DISPOSABLE) ×4
GOWN STRL REUS W/TWL XL LVL3 (GOWN DISPOSABLE) ×4
KIT BASIN OR (CUSTOM PROCEDURE TRAY) ×4 IMPLANT
KIT ROOM TURNOVER OR (KITS) ×4 IMPLANT
LIQUID BAND (GAUZE/BANDAGES/DRESSINGS) ×4 IMPLANT
NS IRRIG 1000ML POUR BTL (IV SOLUTION) ×4 IMPLANT
PACK GENERAL/GYN (CUSTOM PROCEDURE TRAY) ×4 IMPLANT
PAD ABD 8X10 STRL (GAUZE/BANDAGES/DRESSINGS) ×2 IMPLANT
PAD ARMBOARD 7.5X6 YLW CONV (MISCELLANEOUS) ×4 IMPLANT
PREFILTER EVAC NS 1 1/3-3/8IN (MISCELLANEOUS) ×4 IMPLANT
SPECIMEN JAR X LARGE (MISCELLANEOUS) ×4 IMPLANT
SPONGE GAUZE 4X4 12PLY STER LF (GAUZE/BANDAGES/DRESSINGS) ×2 IMPLANT
SPONGE LAP 18X18 X RAY DECT (DISPOSABLE) ×2 IMPLANT
STAPLER VISISTAT 35W (STAPLE) ×4 IMPLANT
STRIP CLOSURE SKIN 1/2X4 (GAUZE/BANDAGES/DRESSINGS) ×1 IMPLANT
STRIP CLOSURE SKIN 1/4X4 (GAUZE/BANDAGES/DRESSINGS) ×3 IMPLANT
SUT ETHILON 2 0 FS 18 (SUTURE) ×8 IMPLANT
SUT MNCRL AB 4-0 PS2 18 (SUTURE) IMPLANT
SUT SILK 3 0 (SUTURE)
SUT SILK 3-0 18XBRD TIE 12 (SUTURE) IMPLANT
SUT VIC AB 2-0 SH 18 (SUTURE) ×4 IMPLANT
SUT VIC AB 3-0 SH 18 (SUTURE) ×4 IMPLANT
TAPE CLOTH SURG 4X10 WHT LF (GAUZE/BANDAGES/DRESSINGS) ×2 IMPLANT
TOWEL OR 17X24 6PK STRL BLUE (TOWEL DISPOSABLE) ×4 IMPLANT
TOWEL OR 17X26 10 PK STRL BLUE (TOWEL DISPOSABLE) ×4 IMPLANT
TUBE CONNECTING 12'X1/4 (SUCTIONS) ×1
TUBE CONNECTING 12X1/4 (SUCTIONS) ×3 IMPLANT

## 2015-07-28 NOTE — Anesthesia Procedure Notes (Signed)
Procedure Name: LMA Insertion Date/Time: 07/28/2015 9:51 AM Performed by: Ronia Hazelett, Sheron Nightingale Pre-anesthesia Checklist: Patient identified, Timeout performed, Emergency Drugs available, Suction available and Patient being monitored Patient Re-evaluated:Patient Re-evaluated prior to inductionOxygen Delivery Method: Circle system utilized Preoxygenation: Pre-oxygenation with 100% oxygen Intubation Type: IV induction Ventilation: Mask ventilation without difficulty LMA: LMA inserted LMA Size: 4.0 Number of attempts: 1 Placement Confirmation: positive ETCO2 ETT to lip (cm): at lips. Dental Injury: Teeth and Oropharynx as per pre-operative assessment

## 2015-07-28 NOTE — Interval H&P Note (Signed)
History and Physical Interval Note:  07/28/2015 9:33 AM  Phyllis Pruitt  has presented today for surgery, with the diagnosis of right breast cancer  The various methods of treatment have been discussed with the patient and family.  Husband at bedside.  Will also plan to remove power port.  After consideration of risks, benefits and other options for treatment, the patient has consented to  Procedure(s): RIGHT MODIFIED RADICAL MASTECTOMY (Right) as a surgical intervention .  The patient's history has been reviewed, patient examined, no change in status, stable for surgery.  I have reviewed the patient's chart and labs.  Questions were answered to the patient's satisfaction.     Kate Sweetman H

## 2015-07-28 NOTE — Anesthesia Preprocedure Evaluation (Addendum)
Anesthesia Evaluation  Patient identified by MRN, date of birth, ID band Patient awake    Reviewed: Allergy & Precautions, H&P , NPO status , Patient's Chart, lab work & pertinent test results  Airway Mallampati: II  TM Distance: >3 FB Neck ROM: Full    Dental no notable dental hx. (+) Chipped, Dental Advisory Given,    Pulmonary neg pulmonary ROS,    Pulmonary exam normal breath sounds clear to auscultation       Cardiovascular negative cardio ROS   Rhythm:Regular Rate:Normal     Neuro/Psych Depression negative neurological ROS     GI/Hepatic negative GI ROS, Neg liver ROS,   Endo/Other  negative endocrine ROS  Renal/GU negative Renal ROS  negative genitourinary   Musculoskeletal   Abdominal   Peds  Hematology  (+) Sickle cell trait ,   Anesthesia Other Findings   Reproductive/Obstetrics negative OB ROS                           Anesthesia Physical Anesthesia Plan  ASA: II  Anesthesia Plan: General   Post-op Pain Management:    Induction: Intravenous  Airway Management Planned: LMA  Additional Equipment:   Intra-op Plan:   Post-operative Plan: Extubation in OR  Informed Consent: I have reviewed the patients History and Physical, chart, labs and discussed the procedure including the risks, benefits and alternatives for the proposed anesthesia with the patient or authorized representative who has indicated his/her understanding and acceptance.   Dental advisory given  Plan Discussed with: CRNA  Anesthesia Plan Comments: (Pt declines block)       Anesthesia Quick Evaluation

## 2015-07-28 NOTE — Anesthesia Postprocedure Evaluation (Signed)
  Anesthesia Post-op Note  Patient: Phyllis Pruitt  Procedure(s) Performed: Procedure(s): RIGHT MODIFIED RADICAL MASTECTOMY (Right) REMOVAL PORT-A-CATH  Patient Location: PACU  Anesthesia Type:General  Level of Consciousness: awake and alert   Airway and Oxygen Therapy: Patient Spontanous Breathing  Post-op Pain: Controlled  Post-op Assessment: Post-op Vital signs reviewed, Patient's Cardiovascular Status Stable and Respiratory Function Stable  Post-op Vital Signs: Reviewed  Filed Vitals:   07/28/15 1345  BP: 137/77  Pulse: 89  Temp:   Resp: 17    Complications: No apparent anesthesia complications

## 2015-07-28 NOTE — Op Note (Signed)
07/28/2015  12:11 PM  PATIENT:  Phyllis Pruitt, 44 y.o., female, MRN: [id number]  PREOP DIAGNOSIS:  Right breast cancer  POSTOP DIAGNOSIS:   Right breast cancer , 10 o'clock position (T2, N1)  PROCEDURE:   Procedure(s):  RIGHT MODIFIED RADICAL MASTECTOMY, REMOVAL PORT-A-CATH  SURGEON:   Phyllis Pruitt, M.D.  ASSISTANT:   None  ANESTHESIA:   general  Anesthesiologist: Phyllis Palau, MD  General  ASA:  2  EBL:  150  ml  BLOOD ADMINISTERED: none  DRAINS: 2 #19 [place] Blake drains  LOCAL MEDICATIONS USED:   None  SPECIMEN:   Right breast (suture medial) with axillary dissection, additional axillary nodal tissue, Superior skin flap (long suture - inferior, short suture - lateral)  COUNTS CORRECT:  YES  INDICATIONS FOR PROCEDURE:  Phyllis Pruitt is a 44 y.o. (DOB: 12/16/1970) AA  female whose primary care physician is Phyllis Casco, MD and comes for right mastectomy for right breast cancer.   Her treating oncologist are Drs. Phyllis Pruitt.  She has completed neoadjuvant chemotherapy and now comes for right modified radical mastectomy.  She has completed her chemotx and wanted the power port removed at the same time.    The indications and risks of the surgery were explained to the patient.  The risks include, but are not limited to, infection, bleeding, and nerve injury.  OPERATIVE NOTE;  The patient was taken to room # 12 at [facility] where she underwent a general anesthesia  supervised by Anesthesiologist: Phyllis Palau, MD. Both her breast and right axilla were prepped with  ChloraPrep and sterilely draped.    A time-out and the surgical check list was reviewed.   I first removed the left subclavian power port.  I made an incision in the old scar and removed the power port intact.  I closed the subcutaneous tissue with 2-0 vicryl and the skin with 4-0 Monocryl.    I made an elliptical incision including the areola in the right breast.  I  developed skin flaps medially to the lateral edge of the sternum, inferiorly to the investing fascia of the rectus abdominus muscle, laterally to the anterior edge of the latissimus dorsi muscle, and superiorly to about 2 finger breaths below the clavicle.  The breast was reflected off the pectoralis muscle from medial to lateral.  The lateral attachments in the right axilla were divided and the breast removed.  A long suture was placed on the medial aspect of the breast.   I dissected the right axilla en bloc to include with the mastectomy specimen.  I found the axillary vein.  I removed one tributary with the axilla.  I went under the pectoralis minor to get the level two nodes.  The long thoracic nerve of Bell and the thoracodorsal nerves were identified.  I also tried to spare at least one intercostal brachial nerve that I saw.   After the axilla was dissected, I found at least two other lymph nodes which I sent separately.  These node were higher in the axilla.   I brought out 2 36 F Blake drains below the inferior flaps.  These were sewn in place with 2-0 Nylons.  I irrigated the wound with 3,000 cc of fluid.   The skin was closed with interrupted 2-0 Vicryl sutures and the skin was closed with a 4-0 Monocryl.  The  Wound was painted with TLiquiBand.    A pressure dressing was placed on the wound and the chest  wrapped with a breast binder.  Her needle and sponge count were correct at the end of the case.   She was transferred to the recovery room in good condition.  Phyllis Overall, MD, [facility] Pager: [phone] Office phone:  [phone]

## 2015-07-28 NOTE — Discharge Instructions (Signed)
[facility] - DISCHARGE INSTRUCTIONS TO PATIENT  Activity:  Driving - May drive in 3 or 4 days, if doing well   Lifting - No lifting more than 15 pounds for one week, then no limit  Wound Care:   Leave dressing until Monday, 10/17.  Then may remove and shower.       Empty and record the output in the drain 2 ot 3 times per day.  Diet:  As tolerated  Follow up appointment:  Call Dr. Pollie Friar office [facility]) at [phone] for an appointment in 7 to 14 days.  Medications and dosages:  Resume your home medications.  You have a prescription for:  Vicodin.  Call Dr. Lucia Gaskins or his office  [phone]) if you have:  Temperature greater than 100.4,  Persistent nausea and vomiting,  Severe uncontrolled pain,  Redness, tenderness, or signs of infection (pain, swelling, redness, odor or green/yellow discharge around the site),  Difficulty breathing, headache or visual disturbances,  Any other questions or concerns you may have after discharge.  In an emergency, call 911 or go to an Emergency Department at a nearby hospital.

## 2015-07-28 NOTE — Transfer of Care (Signed)
2Immediate Anesthesia Transfer of Care Note  Patient: Phyllis Pruitt  Procedure(s) Performed: Procedure(s): RIGHT MODIFIED RADICAL MASTECTOMY (Right) REMOVAL PORT-A-CATH  Patient Location: PACU  Anesthesia Type:General  Level of Consciousness: awake, alert  and oriented  Airway & Oxygen Therapy: Patient Spontanous Breathing and Patient connected to nasal cannula oxygen  Post-op Assessment: Report given to RN and Post -op Vital signs reviewed and stable  Post vital signs: Reviewed and stable  Last Vitals:  Filed Vitals:   07/28/15 0818  BP: 136/80  Pulse: 74  Temp: 36.7 C  Resp: 20    Complications: No apparent anesthesia complications

## 2015-07-29 DIAGNOSIS — C50411 Malignant neoplasm of upper-outer quadrant of right female breast: Secondary | ICD-10-CM | POA: Diagnosis not present

## 2015-07-29 MED ORDER — HYDROCODONE-ACETAMINOPHEN 5-325 MG PO TABS: 1.0000 | ORAL_TABLET | ORAL | Status: DC | PRN

## 2015-07-29 NOTE — Progress Notes (Signed)
Patient discharged to home with instructions, verbalized understanding. 

## 2015-07-29 NOTE — Discharge Summary (Signed)
Patient ID: HALAINA VANDUZER MRN: [id number] DOB/AGE: [birth year] 44 y.o.  Admit date: 07/28/2015 Discharge date: 07/29/2015  Procedures: RIGHT MODIFIED RADICAL MASTECTOMY, REMOVAL PORT-A-CATH  Consults: None  Reason for Admission:  She has breast cancer and is being admitted for mastectomy and removal of PAC  Admission Diagnoses:  1. Breast Cancer  Hospital Course: the patient was admitted and taken to the OR for the above procedure.  She tolerated this procedure well.  On POD 1, she was tolerating a solid diet and her pain was very well controlled.  She was stable for dc home.  PE: Chest: incision is c/d/i.  Both JP drains in place with serosang drainage.   Heart: regular  Discharge Diagnoses:  Active Problems:   Breast cancer, stage 2 (HCC) s/p mastectomy, right and removal of PAC  Discharge Medications:   Medication List    STOP taking these medications        diphenhydramine-acetaminophen 25-500 MG Tabs tablet  Commonly known as:  TYLENOL PM      TAKE these medications        GINGER ROOT PO  Take 1 tablet by mouth daily.     HYDROcodone-acetaminophen 5-325 MG tablet  Commonly known as:  NORCO/VICODIN  Take 1-2 tablets by mouth every 4 (four) hours as needed for moderate pain.     multivitamin with minerals Tabs tablet  Take 1 tablet by mouth daily.     PROBIOTIC PO  Take 1 tablet by mouth daily.     TURMERIC CURCUMIN PO  Take 1 capsule by mouth daily.        Discharge Instructions:     Follow-up Information    Follow up with NEWMAN,DAVID H, MD In 1 week.   Specialty:  General Surgery   Why:  our office will call you   Contact information:   [address] [place] [address] [phone]       Signed: Henreitta Cea 07/29/2015, 8:54 AM

## 2015-07-31 ENCOUNTER — Encounter (HOSPITAL_COMMUNITY): Payer: Self-pay | Admitting: Surgery

## 2015-07-31 ENCOUNTER — Encounter: Payer: Self-pay | Admitting: Medical Oncology

## 2015-07-31 NOTE — Progress Notes (Signed)
PREVENT Received patient's protocol Medication Diary for the months of August and September. Patient documented taking one Atorvastin/placebo daily, no missed days noted and no symptoms noted on either calendars.  Patient's six months on protocol approaching in November. Adele Dan, RN, BSN Clinical Research 07/31/2015 10:39 AM

## 2015-08-03 NOTE — Assessment & Plan Note (Addendum)
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 0.6 cm; 3/13 LN positive, T1c N1 M0 stage IIA, ER/PR HER-2 negative.  Pathology review: I discussed the final pathology report with her and provided her with a copy of this result. Unfortunately the patient still has significant amount of disease especially in the lymph nodes. The breast itself is significantly smaller with the lymph nodes are certainly worrisome.  Recommendation: 1. Adjuvant radiation therapy 2. Followed by adjuvant oral Xeloda 8 cycles (6 months)  CREATE-X study enrolled 910 patient's with triple negative breast cancer and residual disease after neo-adjuvant therapy. 455 patients assigned to 1250 mg/m times daily 2 weeks on 1 week off  up to 8 cycles, at 5 years PFS 74.1% with Xeloda compared to 67.7% in control arm, 30% reduction in risk, overall survival 89.2% versus 83.9%, hand-foot syndrome was seen in 72.3% with Xeloda grade 3 in 10.9%  Return to clinic after radiation to start Xeloda.

## 2015-08-04 ENCOUNTER — Encounter: Payer: Self-pay | Admitting: *Deleted

## 2015-08-04 ENCOUNTER — Telehealth: Payer: Self-pay

## 2015-08-04 ENCOUNTER — Ambulatory Visit (HOSPITAL_BASED_OUTPATIENT_CLINIC_OR_DEPARTMENT_OTHER): Payer: 59 | Admitting: Hematology and Oncology

## 2015-08-04 ENCOUNTER — Encounter: Payer: Self-pay | Admitting: Hematology and Oncology

## 2015-08-04 VITALS — BP 132/84 | HR 87 | Temp 97.9°F | Resp 17 | Ht 65.5 in | Wt 187.3 lb

## 2015-08-04 DIAGNOSIS — T451X5A Adverse effect of antineoplastic and immunosuppressive drugs, initial encounter: Secondary | ICD-10-CM

## 2015-08-04 DIAGNOSIS — C773 Secondary and unspecified malignant neoplasm of axilla and upper limb lymph nodes: Secondary | ICD-10-CM

## 2015-08-04 DIAGNOSIS — C50411 Malignant neoplasm of upper-outer quadrant of right female breast: Secondary | ICD-10-CM | POA: Diagnosis not present

## 2015-08-04 DIAGNOSIS — Z171 Estrogen receptor negative status [ER-]: Secondary | ICD-10-CM | POA: Diagnosis not present

## 2015-08-04 DIAGNOSIS — G620 Drug-induced polyneuropathy: Secondary | ICD-10-CM | POA: Insufficient documentation

## 2015-08-04 NOTE — Telephone Encounter (Signed)
Call rcvd from Georgeann Oppenheim at [place] re: pt.  Attempted call back 3 times to [phone].  No answer, automatic disconnect - no answering machine.

## 2015-08-04 NOTE — Progress Notes (Signed)
Patient Care Team: [name], MD as PCP - General (Family Medicine) Chauncey Cruel, MD as Consulting Physician (Oncology) Thea Silversmith, MD as Consulting Physician (Radiation Oncology) Mauro Kaufmann, RN as Registered Nurse Rockwell Germany, RN as Registered Nurse Alphonsa Overall, MD as Consulting Physician (General Surgery)  DIAGNOSIS: Breast cancer of upper-outer quadrant of right female breast [facility])   Staging form: Breast, AJCC 7th Edition     Clinical stage from 02/08/2015: Stage IIIA (T3, N1, M0) - Unsigned   SUMMARY OF ONCOLOGIC HISTORY:   Breast cancer of upper-outer quadrant of right female breast ([place])   01/27/2015 Initial Diagnosis Right breast 9:00 and 10:00 biopsy: Grade 3 IDC ER/PR negative, HER-2 negative, Ki-67 77% and 73%; the right axillary lymph node biopsy: EF 22% positive   01/30/2015 Breast MRI Right breast masses 9 and 10:00 position, additional irregular masses with non-mass enhancement upper outer quadrant with one mass extending into upper inner quadrant, multifocal, multicentric 13.7 x 4.6 x 4.6 cm, malignant right axillary lymph node   02/15/2015 Imaging No evidence of metastatic disease, 2.2 cm enhancing lesion anterior left lower kidney suspicious for solid renal neoplasm   02/20/2015 -  Neo-Adjuvant Chemotherapy Adriamycin and cytoxan dose dense Q 2 weeks X 4 foll by Abraxane weekly x 12   07/10/2015 Breast MRI Multiple irregular enhancing masses right breast are less confluent and smaller dominant mass 10:00 2.1 x 1.9 x 1.3 cm; right breast 11:00 mass measures 1.2 x 1.2 x 0.8 cm, decreased right axillary lymph node thickening   07/28/2015 Surgery Rt mastectomy: Multifocal IDC 2 cm, 1.5 cm. 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 She has 2 drains and is sore from the surgery. She also continues to have neuropathy in her hands and feet.  REVIEW OF SYSTEMS:   Constitutional: Denies fevers, chills or abnormal weight loss Eyes: Denies blurriness of vision Ears, nose, mouth, throat, and face: Denies mucositis or sore throat Respiratory: Denies cough, dyspnea or wheezes Cardiovascular: Denies palpitation, chest discomfort or lower extremity swelling Gastrointestinal:  Denies nausea, heartburn or change in bowel habits Skin: Denies abnormal skin rashes Lymphatics: Denies new lymphadenopathy or easy bruising Neurological: Severe tingling and numbness of her hands and feet with difficulty with buttoning or holding onto objects or typing. She cannot stand on her feet for more than a few minutes. Behavioral/Psych: Mood is stable, no new changes  Breast: Soreness from recent surgery. All other systems were reviewed with the patient and are negative.  I have reviewed the past medical history, past surgical history, social history and family history with the patient and they are unchanged from previous note.  ALLERGIES:  has No Known Allergies.  MEDICATIONS:  Current Outpatient Prescriptions  Medication Sig Dispense Refill  . Ginger, Zingiber officinalis, (GINGER ROOT PO) Take 1 tablet by mouth daily.     Marland Kitchen HYDROcodone-acetaminophen (NORCO/VICODIN) 5-325 MG tablet Take 1-2 tablets by mouth every 4 (four) hours as needed for moderate pain. 40 tablet 0  . Multiple Vitamin (MULTIVITAMIN WITH MINERALS) TABS tablet Take 1 tablet by mouth daily.    . Probiotic Product (PROBIOTIC PO) Take 1 tablet by mouth daily.    . TURMERIC CURCUMIN PO Take 1  capsule by mouth daily.      No current facility-administered medications for this visit.    PHYSICAL EXAMINATION: ECOG PERFORMANCE STATUS: 2 - Symptomatic, <50% confined to bed  Filed Vitals:    08/04/15 1012  BP: 132/84  Pulse: 87  Temp: 97.9 F (36.6 C)  Resp: 17   Filed Weights   08/04/15 1012  Weight: 187 lb 4.8 oz (84.959 kg)    GENERAL:alert, no distress and comfortable SKIN: skin color, texture, turgor are normal, no rashes or significant lesions EYES: normal, Conjunctiva are pink and non-injected, sclera clear OROPHARYNX:no exudate, no erythema and lips, buccal mucosa, and tongue normal  NECK: supple, thyroid normal size, non-tender, without nodularity LYMPH:  no palpable lymphadenopathy in the cervical, axillary or inguinal LUNGS: clear to auscultation and percussion with normal breathing effort HEART: regular rate & rhythm and no murmurs and no lower extremity edema ABDOMEN:abdomen soft, non-tender and normal bowel sounds Musculoskeletal:no cyanosis of digits and no clubbing  NEURO: alert & oriented x 3 with fluent speech, severe peripheral neuropathy grade 3   LABORATORY DATA:  I have reviewed the data as listed   Chemistry      Component Value Date/Time   NA 139 07/26/2015 0850   NA 141 07/12/2015 1019   K 3.4* 07/26/2015 0850   K 3.7 07/12/2015 1019   CL 104 07/26/2015 0850   CO2 30 07/26/2015 0850   CO2 27 07/12/2015 1019   BUN 6 07/26/2015 0850   BUN 8.0 07/12/2015 1019   CREATININE 0.68 07/26/2015 0850   CREATININE 0.7 07/12/2015 1019      Component Value Date/Time   CALCIUM 9.0 07/26/2015 0850   CALCIUM 9.1 07/12/2015 1019   ALKPHOS 68 07/26/2015 0850   ALKPHOS 72 07/12/2015 1019   AST 21 07/26/2015 0850   AST 19 07/12/2015 1019   ALT 23 07/26/2015 0850   ALT 17 07/12/2015 1019   BILITOT 0.5 07/26/2015 0850   BILITOT 0.30 07/12/2015 1019       Lab Results  Component Value Date   WBC 6.6 07/26/2015   HGB 11.4* 07/26/2015   HCT 34.0* 07/26/2015   MCV 82.7 07/26/2015   PLT 264 07/26/2015   NEUTROABS 5.0 07/26/2015    ASSESSMENT & PLAN:  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 0.6 cm; 3/13 LN positive, T1c N1 M0 stage IIA, ER/PR HER-2 negative.  Pathology review: I discussed the final pathology report with her and provided her with a copy of this result. Unfortunately the patient still has significant amount of disease especially in the lymph nodes. The breast itself is significantly smaller with the lymph nodes are certainly worrisome.  Recommendation: 1. Adjuvant radiation therapy 2. Followed by adjuvant oral Xeloda 8 cycles (6 months) 3. Followed by tamoxifen 20 mg once daily 5 years (one of the lymph nodes was 20% ER positive)  CREATE-X study enrolled 910 patient's with triple negative breast cancer and residual disease after neo-adjuvant therapy. 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 She cannot function at work because she is unable to type or stand on her feet for any length of time. I support her application for disability. She also has difficulty with buttoning her shirt and holding onto objects because of neuropathy.  Return to clinic after radiation to start Xeloda.  No orders of the defined types were placed in this encounter.   The patient has a good understanding of the overall plan. she agrees with it. she will call with any problems that may develop  before the next visit here.   Rulon Eisenmenger, MD 08/04/2015

## 2015-08-04 NOTE — Addendum Note (Signed)
Addended by: Prentiss Bells on: 08/04/2015 01:18 PM   Modules accepted: Medications

## 2015-08-09 ENCOUNTER — Telehealth: Payer: Self-pay | Admitting: Hematology and Oncology

## 2015-08-09 NOTE — Telephone Encounter (Signed)
Faxed pt medical records to [place]

## 2015-08-14 NOTE — Progress Notes (Signed)
Location of Breast Cancer: Right Upper Outer Quadrant   Histology per Pathology Report:   Receptor Status: ER(22% +, right axillary lymph node), PR (-), Her2-neu (-)  Did patient present with symptoms (if so, please note symptoms) or was this found on screening mammography?: she reports that she felt a lump in her breast, prior to having a mammogram  Past/Anticipated interventions by surgeon, if any: 07-28-15 Right modified radical mastectomy Dr. Lucia Gaskins,   Past/Anticipated interventions by medical oncology, if any: Adjuvant oral Xeloda x 8 cycles (6 months)  Lymphedema issues, if any: Yes  Pain issues, if any:  soreness of right posterior, upper arm  SAFETY ISSUES:  Prior radiation? No  Pacemaker/ICD? No  Possible current pregnancy?N/A  Is the patient on methotrexate? No  Current Complaints / other details:  44 yr old with history of right breast triple negative cancer underwent mastectomny after neoadjuvant chemotherapy.  Grade 3 neuropathy in hands and feet. Removal of port a cath on 07-28-15.  Has JP drain with serous drainage.  Jenene Slicker, RN 08/14/2015,12:08 PM

## 2015-08-16 ENCOUNTER — Encounter: Payer: Self-pay | Admitting: Radiation Oncology

## 2015-08-16 ENCOUNTER — Ambulatory Visit
Admission: RE | Admit: 2015-08-16 | Discharge: 2015-08-16 | Disposition: A | Discharge status: Home / Self Care | Payer: 59 | Source: Ambulatory Visit | Attending: Radiation Oncology | Admitting: Radiation Oncology

## 2015-08-16 VITALS — BP 118/61 | HR 79 | Temp 98.5°F | Resp 16 | Ht 65.5 in | Wt 197.3 lb

## 2015-08-16 DIAGNOSIS — Z9221 Personal history of antineoplastic chemotherapy: Secondary | ICD-10-CM | POA: Insufficient documentation

## 2015-08-16 DIAGNOSIS — Z510 Encounter for antineoplastic radiation therapy: Secondary | ICD-10-CM | POA: Insufficient documentation

## 2015-08-16 DIAGNOSIS — Z170 Estrogen receptor positive status [ER+]: Secondary | ICD-10-CM | POA: Insufficient documentation

## 2015-08-16 DIAGNOSIS — Z9011 Acquired absence of right breast and nipple: Secondary | ICD-10-CM | POA: Diagnosis not present

## 2015-08-16 DIAGNOSIS — C50411 Malignant neoplasm of upper-outer quadrant of right female breast: Secondary | ICD-10-CM | POA: Diagnosis present

## 2015-08-16 NOTE — Progress Notes (Signed)
   Department of Radiation Oncology  Phone:  [phone] Fax:        [phone]   Name: Phyllis Pruitt MRN: [id number]  DOB: January 16, 1971  Date: 08/16/2015  Follow Up Visit Note  Diagnosis: Breast cancer of upper-outer quadrant of right female breast [facility])   Staging form: Breast, AJCC 7th Edition     Clinical stage from 02/08/2015: Stage IIIA (T3, N1, M0) - Unsigned T3N1 triple negative right breast cancer.   Interval History: Phyllis Pruitt presents today for routine followup.  Phyllis Pruitt completed 12 cycles of Neo-adjuvant Chemotherapy. She had her mastectomy on 07/28/15 which revealed multifocal invasive ductal carcinoma. Three out of twelve lymph nodes were positive. Her margins were negative. There was no lymphovascular invasion. Work up for metastatic disease was also negative.   She has recovered well from her surgery. She still has one drain in.  She is seeing Dr. Lucia Gaskins next week. She ha a second opinion with Basilio Cairo with [facility] regarding radiation who did recommend radiation. She has elected not to pursue reconstruction at this time but has met with plastic surgery. She will go on capecitabine after radiation is over. She is a bit overwhelmed and questioning why this happened to her.   She has some neuropathy in her fingers and toes that prevents her from typing and working. She also has arthralgias.   Physical Exam:  Filed Vitals:   08/16/15 1013  BP: 118/61  Pulse: 79  Temp: 98.5 F (36.9 C)  Resp: 16  Height: 5' 5.5" (1.664 m)  Weight: 197 lb 4.8 oz (89.495 kg)  Good ROM in bilateral arms. She was able to place arms behind her head. She has residual numbness under her right arm. She has a drain in place.  IMPRESSION: Phyllis Pruitt is a 44 y.o. female with T3N1 triple negative breast cancer.  PLAN:  I spoke to the patient today regarding her diagnosis and options for treatment. We discussed the equivalence in terms of survival and local failure between mastectomy and breast  conservation. We discussed the role of radiation in decreasing local failures in patients who undergo mastectomy and have risk factors for recurrence including positive lymph nodes and/or tumors over 5 cm and/or positive margins. We discussed the process of simulation and the placement tattoos. We discussed 6 weeks of treatment as an outpatient to her chest wall and supraclavicular lymph nodes. . We discussed the possibility of asymptomatic lung damage. We discussed the low likelihood of secondary malignancies. We discussed the possible side effects including but not limited to skin redness, fatigue, permanent skin darkening, and chest wall swelling. We discussed increased complications that can occur with reconstruction after radiation.   I referred her to our ABC class.   She will call to be scheduled for simulation next week when she's had her drains removed. She will start treatment the following week. She has signed informed consent.     Thea Silversmith, MD   This document serves as a record of services personally performed by Thea Silversmith, MD. It was created on her behalf by  Lendon Collar, a trained medical scribe. The creation of this record is based on the scribe's personal observations and the provider's statements to them. This document has been checked and approved by the attending provider.

## 2015-08-17 ENCOUNTER — Telehealth: Payer: Self-pay | Admitting: Medical Oncology

## 2015-08-17 DIAGNOSIS — C50411 Malignant neoplasm of upper-outer quadrant of right female breast: Secondary | ICD-10-CM

## 2015-08-17 NOTE — Telephone Encounter (Signed)
PREVENT: Patient's 6 mos on study is approaching, called patient to inquire as to how she is doing and to inquire as to how many study dispensed pills of atorvastatin/placebo she has left. Patient states she has "5 or 6" pills left. Patient stated that she is available to come in Tuesday for next 6 month dispense of study drug and her 6 month neurocognitive test and study required labs. Appointment for patient scheduled Tuesday, November 8th @ 1230 labs/neurocog and study drug dispensation, per patient's request.  Adele Dan, RN, BSN Clinical Research 08/17/2015 1:53 PM

## 2015-08-21 ENCOUNTER — Telehealth: Payer: Self-pay | Admitting: Medical Oncology

## 2015-08-21 NOTE — Telephone Encounter (Signed)
PREVENT: Confirmed with patient tomorrow's appointment at 12:30, reminded patient to bring study-dispensed atorvastatin/placbo bottle, that was dispensed to her in May, with any remaining pills. Patient gave verbal understanding.

## 2015-08-22 ENCOUNTER — Other Ambulatory Visit: Payer: Self-pay | Admitting: Hematology and Oncology

## 2015-08-22 ENCOUNTER — Other Ambulatory Visit: Payer: 59

## 2015-08-22 ENCOUNTER — Encounter: Payer: Self-pay | Admitting: Medical Oncology

## 2015-08-22 ENCOUNTER — Encounter: Payer: 59 | Admitting: Medical Oncology

## 2015-08-22 DIAGNOSIS — C50411 Malignant neoplasm of upper-outer quadrant of right female breast: Secondary | ICD-10-CM

## 2015-08-22 DIAGNOSIS — Z006 Encounter for examination for normal comparison and control in clinical research program: Secondary | ICD-10-CM

## 2015-08-22 LAB — RESEARCH LABS

## 2015-08-22 MED ORDER — INV-ATORVASTATIN/PLACEBO 40 MG TABS WAKE FOREST WF 98213: 1.0000 | ORAL_TABLET | Freq: Every day | ORAL | Status: DC

## 2015-08-22 NOTE — Progress Notes (Signed)
PREVENT: 6 Month Patient here today alone, for her 6 month on study assessment and study provided atorvastatin/placebo medication dispense. I met with patient during her lab appointment. Study required research labs obtained, resting vitals, and waist measurement obtained. Patient negative for peripheral edema. Patient reports joint and muscle aches as well as neuropathy, both starting approximately in August and per patient feels that both A/E's are related to chemotherapy. Patient's AST/ALT have been historically WNL. Patient with recent radiotherapy consult (11/2) and per Dr. [name] patient with arthralgia, confirmed with Dr. [name]. Patient denies headaches, denies GI/abdominal symptoms, denies confusion and reports no upper respiratory issues. Patient returned prior study dispensed bottle, empty, and reports to not have had any difficulty taking medication as directed, reports to have only missed two days. Patient states she takes one pill each evening approximately around 8 pm. New study dispensed bottle given to patient (bottle # [id number]) along with medication diaries ( Amend 18) through to June. Patient reports to have started doxycyline (she was unsure of the exact medication name) antibiotic on Saturday (11/5) and instructed to take for two weeks, reports no other new medications. 6 month neurocognitive test and questionnaire completed with Phyllis Pruitt, research assistant. 6 month cardiac MRI scheduled for tomorrow @ 1100, patient confirmed appointment. All patient's questions answered to her satisfaction, patient denies further questions at this time. I informed patient will call her with MRI results and that they can take approx 2-4 weeks.  I thanked patient for her time and encouraged her to call Dr. [name] or myself with any questions or concerns. Patient is scheduled to start radiation therapy within next two weeks.  Adele Dan, RN, BSN Clinical Research 08/22/2015 3:05 PM

## 2015-08-22 NOTE — Progress Notes (Signed)
I met with patient today to complete her 6 month neurocognitive assessment as part of her enrollment in the PREVENT ([place]) research study. Patient successfully completed all sections of the assessment.  Patient was very pleasant and cooperative.  I spent approximately 25 minutes with patient and 10 minutes completing paperwork associated with assessment. I thanked patient for her time.

## 2015-08-23 ENCOUNTER — Ambulatory Visit (HOSPITAL_COMMUNITY)
Admission: RE | Admit: 2015-08-23 | Discharge: 2015-08-23 | Disposition: A | Discharge status: Home / Self Care | Payer: 59 | Source: Ambulatory Visit | Attending: Hematology and Oncology | Admitting: Hematology and Oncology

## 2015-08-23 DIAGNOSIS — Z006 Encounter for examination for normal comparison and control in clinical research program: Secondary | ICD-10-CM

## 2015-08-23 NOTE — Addendum Note (Signed)
Encounter addended by: Benn Moulder, RN on: 08/23/2015  8:52 AM<BR>     Documentation filed: Charges VN

## 2015-08-24 ENCOUNTER — Ambulatory Visit
Admission: RE | Admit: 2015-08-24 | Discharge: 2015-08-24 | Disposition: A | Discharge status: Home / Self Care | Payer: 59 | Source: Ambulatory Visit | Attending: Radiation Oncology | Admitting: Radiation Oncology

## 2015-08-24 DIAGNOSIS — Z510 Encounter for antineoplastic radiation therapy: Secondary | ICD-10-CM | POA: Diagnosis not present

## 2015-08-24 DIAGNOSIS — C50411 Malignant neoplasm of upper-outer quadrant of right female breast: Secondary | ICD-10-CM

## 2015-08-24 NOTE — Progress Notes (Signed)
Radiation Oncology         [phone] ________________________________  Name: Phyllis Pruitt      MRN: [id number]          Date: 08/24/2015              DOB: November 13, 1970  Optical Surface Tracking Plan:  Since intensity modulated radiotherapy (IMRT) and 3D conformal radiation treatment methods are predicated on accurate and precise positioning for treatment, intrafraction motion monitoring is medically necessary to ensure accurate and safe treatment delivery.  The ability to quantify intrafraction motion without excessive ionizing radiation dose can only be performed with optical surface tracking. Accordingly, surface imaging offers the opportunity to obtain 3D measurements of patient position throughout IMRT and 3D treatments without excessive radiation exposure.  I am ordering optical surface tracking for this patient's upcoming course of radiotherapy. ________________________________ Signature   Reference:   Ursula Alert, J, et al. Surface imaging-based analysis of intrafraction motion for breast radiotherapy patients.Journal of [place], n. 6, nov. 2014. ISSN [id number].   Available at: <http://www.jacmp.org/index.php/jacmp/article/view/4957>.

## 2015-08-24 NOTE — Progress Notes (Signed)
Name: SOPHIEA KOLANDER   MRN: [id number]  Date:  08/24/2015  DOB: [birth year]  Status:outpatient    DIAGNOSIS: Breast cancer of upper-outer quadrant of right female breast [facility])   Staging form: Breast, AJCC 7th Edition     Clinical stage from 02/08/2015: Stage IIIA (T3, N1, M0) - Unsigned T3N1 triple negative right breast cancer.  CONSENT VERIFIED: yes   SET UP: Patient is setup supine   IMMOBILIZATION:  The following immobilization was used:Custom Moldable Pillow, breast board.   NARRATIVE: Ms. Halaby was brought to the [facility].  Identity was confirmed.  All relevant records and images related to the planned course of therapy were reviewed.  Then, the patient was positioned in a stable reproducible clinical set-up for radiation therapy.  Wires were placed to delineate the clinical extent of breast tissue. A wire was placed on the scar as well.  CT images were obtained.  An isocenter was placed. Skin markings were placed.  The CT images were loaded into the planning software where the target and avoidance structures were contoured.  The radiation prescription was entered and confirmed. The patient was discharged in stable condition and tolerated simulation well.    TREATMENT PLANNING NOTE:  Treatment planning then occurred. I have requested : MLC's, isodose plan, basic dose calculation  I personally designed and supervised the construction of 3 medically necessary complex treatment devices for the protection of critical normal structures including the lungs and contralateral breast as well as the immobilization device which is necessary for set up certainty.   3D simulation occurred. I requested and analyzed a dose volume histogram of the heart, lungs and lumpectomy cavity.    This document serves as a record of services personally performed by Thea Silversmith, MD. It was created on her behalf by  Lendon Collar, a trained medical scribe. The creation of this record  is based on the scribe's personal observations and the provider's statements to them. This document has been checked and approved by the attending provider.

## 2015-08-31 DIAGNOSIS — Z510 Encounter for antineoplastic radiation therapy: Secondary | ICD-10-CM | POA: Diagnosis not present

## 2015-09-03 ENCOUNTER — Ambulatory Visit
Admission: RE | Admit: 2015-09-03 | Discharge: 2015-09-03 | Disposition: A | Discharge status: Home / Self Care | Payer: 59 | Source: Ambulatory Visit | Attending: Radiation Oncology | Admitting: Radiation Oncology

## 2015-09-03 DIAGNOSIS — Z510 Encounter for antineoplastic radiation therapy: Secondary | ICD-10-CM | POA: Diagnosis not present

## 2015-09-04 ENCOUNTER — Ambulatory Visit
Admission: RE | Admit: 2015-09-04 | Discharge: 2015-09-04 | Disposition: A | Discharge status: Home / Self Care | Payer: 59 | Source: Ambulatory Visit | Attending: Radiation Oncology | Admitting: Radiation Oncology

## 2015-09-04 DIAGNOSIS — Z510 Encounter for antineoplastic radiation therapy: Secondary | ICD-10-CM | POA: Diagnosis not present

## 2015-09-05 ENCOUNTER — Ambulatory Visit
Admission: RE | Admit: 2015-09-05 | Discharge: 2015-09-05 | Disposition: A | Discharge status: Home / Self Care | Payer: 59 | Source: Ambulatory Visit | Attending: Radiation Oncology | Admitting: Radiation Oncology

## 2015-09-05 VITALS — BP 133/74 | HR 83 | Temp 97.8°F | Resp 18 | Wt 182.2 lb

## 2015-09-05 DIAGNOSIS — C50411 Malignant neoplasm of upper-outer quadrant of right female breast: Secondary | ICD-10-CM

## 2015-09-05 DIAGNOSIS — Z510 Encounter for antineoplastic radiation therapy: Secondary | ICD-10-CM | POA: Diagnosis not present

## 2015-09-05 MED ORDER — RADIAPLEXRX EX GEL
Freq: Once | CUTANEOUS | Status: AC
Start: 2015-09-05 — End: 2015-09-05
  Administered 2015-09-05: 13:00:00 via TOPICAL

## 2015-09-05 MED ORDER — ALRA NON-METALLIC DEODORANT (RAD-ONC)
1.0000 "application " | Freq: Once | TOPICAL | Status: AC
  Administered 2015-09-05: 1 via TOPICAL

## 2015-09-05 NOTE — Addendum Note (Signed)
Encounter addended by: Benn Moulder, RN on: 09/05/2015  3:27 PM<BR>     Documentation filed: Inpatient Patient Education, Notes Section

## 2015-09-05 NOTE — Progress Notes (Signed)
Weekly Management Note Current Dose: 5.4  Gy  Projected Dose: 50.4 Gy   Narrative:  The patient presents for routine under treatment assessment.  CBCT/MVCT images/Port film x-rays were reviewed.  The chart was checked. Doing well. No complaints.   Physical Findings: Weight: 182 lb 3.2 oz (82.645 kg). Unchanged  Impression:  The patient is tolerating radiation.  Plan:  Continue treatment as planned.RN education performed. Pt knows she needs to follow up with urology regarding renal mass but has not made appointment.

## 2015-09-05 NOTE — Progress Notes (Deleted)
ts

## 2015-09-05 NOTE — Progress Notes (Signed)
Phyllis Pruitt has received 3 fractions. Skin looks good today.  Energy good is working out before her treatments everyday. BP 133/74 mmHg  Pulse 83  Temp(Src) 97.8 F (36.6 C)  Resp 18  Wt 182 lb 3.2 oz (82.645 kg)  SpO2 99%  Wt Readings from Last 3 Encounters:  09/05/15 182 lb 3.2 oz (82.645 kg)  08/22/15 194 lb 5 oz (88.14 kg)  08/16/15 197 lb 4.8 oz (89.495 kg)

## 2015-09-05 NOTE — Progress Notes (Signed)
Pt here for patient teaching.  Pt given Radiation and You booklet, skin care instructions, Alra deodorant and Radiaplex gel. Pt reports they have not watched the Radiation Therapy Education video.  Reviewed areas of pertinence such as fatigue, skin changes, breast tenderness and breast swelling . Pt able to give teach back of to pat skin, use unscented/gentle soap and drink plenty of water,apply Radiaplex bid, avoid applying anything to skin within 4 hours of treatment, avoid wearing an under wire bra and to use an electric razor if they must shave. Pt demonstrated understanding of information given and will contact nursing with any questions or concerns.    Pt states they have not watched the Radiation Therapy Education.  On September 05, 2015 video presented and watched. Http://rtanswers.org/treatmentinformation/whattoexpect/indexm Patient given link.

## 2015-09-05 NOTE — Addendum Note (Signed)
Encounter addended by: Malena Edman, RN on: 09/05/2015  1:10 PM<BR>     Documentation filed: Orders, [employer], Inpatient [facility], Inpatient Patient Education, Notes Section

## 2015-09-06 ENCOUNTER — Ambulatory Visit
Admission: RE | Admit: 2015-09-06 | Discharge: 2015-09-06 | Disposition: A | Discharge status: Home / Self Care | Payer: 59 | Source: Ambulatory Visit | Attending: Radiation Oncology | Admitting: Radiation Oncology

## 2015-09-06 DIAGNOSIS — Z510 Encounter for antineoplastic radiation therapy: Secondary | ICD-10-CM | POA: Diagnosis not present

## 2015-09-11 ENCOUNTER — Ambulatory Visit
Admission: RE | Admit: 2015-09-11 | Discharge: 2015-09-11 | Disposition: A | Discharge status: Home / Self Care | Payer: 59 | Source: Ambulatory Visit | Attending: Radiation Oncology | Admitting: Radiation Oncology

## 2015-09-11 ENCOUNTER — Encounter: Payer: Self-pay | Admitting: *Deleted

## 2015-09-11 ENCOUNTER — Telehealth: Payer: Self-pay | Admitting: Medical Oncology

## 2015-09-11 ENCOUNTER — Telehealth: Payer: Self-pay | Admitting: *Deleted

## 2015-09-11 DIAGNOSIS — Z510 Encounter for antineoplastic radiation therapy: Secondary | ICD-10-CM | POA: Diagnosis not present

## 2015-09-11 NOTE — Telephone Encounter (Signed)
PREVENT: LVMOM with patient informing her that all study labs have resulted and I have received them and Dr. [name] reviewed. Per Dr. [name], wants patient to f/u with her PCP regarding cholesterol results. I asked patient to return call to clinic so that I may give her the resulted lab values.  Also, informed patient that I do not have the results from the Cardiac MRI completed for study on 11/9 yet.

## 2015-09-11 NOTE — Telephone Encounter (Signed)
Spoke with patient to follow up after start of radiation.  She states she is doing well.  Encouraged her to call with any needs or concerns. 

## 2015-09-12 ENCOUNTER — Ambulatory Visit
Admission: RE | Admit: 2015-09-12 | Discharge: 2015-09-12 | Disposition: A | Discharge status: Home / Self Care | Payer: 59 | Source: Ambulatory Visit | Attending: Radiation Oncology | Admitting: Radiation Oncology

## 2015-09-12 DIAGNOSIS — Z510 Encounter for antineoplastic radiation therapy: Secondary | ICD-10-CM | POA: Diagnosis not present

## 2015-09-12 DIAGNOSIS — C50411 Malignant neoplasm of upper-outer quadrant of right female breast: Secondary | ICD-10-CM

## 2015-09-12 NOTE — Progress Notes (Signed)
Weekly Management Note Current Dose: 10.8 Gy  Projected Dose: 50.4 Gy   Narrative:  The patient presents for routine under treatment assessment.  CBCT/MVCT images/Port film x-rays were reviewed.  The chart was checked. Doing well. No complaints. Weight gain continues.   Physical Findings: No skin changes.   Impression:  The patient is tolerating radiation.  Plan:  Continue treatment as planned.Continue radiaplex.

## 2015-09-13 ENCOUNTER — Ambulatory Visit
Admission: RE | Admit: 2015-09-13 | Discharge: 2015-09-13 | Disposition: A | Discharge status: Home / Self Care | Payer: 59 | Source: Ambulatory Visit | Attending: Radiation Oncology | Admitting: Radiation Oncology

## 2015-09-13 DIAGNOSIS — Z510 Encounter for antineoplastic radiation therapy: Secondary | ICD-10-CM | POA: Diagnosis not present

## 2015-09-14 ENCOUNTER — Ambulatory Visit
Admission: RE | Admit: 2015-09-14 | Discharge: 2015-09-14 | Disposition: A | Discharge status: Home / Self Care | Payer: 59 | Source: Ambulatory Visit | Attending: Radiation Oncology | Admitting: Radiation Oncology

## 2015-09-14 DIAGNOSIS — Z510 Encounter for antineoplastic radiation therapy: Secondary | ICD-10-CM | POA: Diagnosis not present

## 2015-09-15 ENCOUNTER — Ambulatory Visit
Admission: RE | Admit: 2015-09-15 | Discharge: 2015-09-15 | Disposition: A | Discharge status: Home / Self Care | Payer: 59 | Source: Ambulatory Visit | Attending: Radiation Oncology | Admitting: Radiation Oncology

## 2015-09-15 DIAGNOSIS — Z510 Encounter for antineoplastic radiation therapy: Secondary | ICD-10-CM | POA: Diagnosis not present

## 2015-09-18 ENCOUNTER — Ambulatory Visit
Admission: RE | Admit: 2015-09-18 | Discharge: 2015-09-18 | Disposition: A | Discharge status: Home / Self Care | Payer: 59 | Source: Ambulatory Visit | Attending: Radiation Oncology | Admitting: Radiation Oncology

## 2015-09-18 DIAGNOSIS — Z510 Encounter for antineoplastic radiation therapy: Secondary | ICD-10-CM | POA: Diagnosis not present

## 2015-09-19 ENCOUNTER — Encounter: Payer: Self-pay | Admitting: Hematology and Oncology

## 2015-09-19 ENCOUNTER — Ambulatory Visit
Admission: RE | Admit: 2015-09-19 | Discharge: 2015-09-19 | Disposition: A | Discharge status: Home / Self Care | Payer: 59 | Source: Ambulatory Visit | Attending: Radiation Oncology | Admitting: Radiation Oncology

## 2015-09-19 ENCOUNTER — Encounter: Payer: Self-pay | Admitting: Radiation Oncology

## 2015-09-19 ENCOUNTER — Encounter: Payer: Self-pay | Admitting: Medical Oncology

## 2015-09-19 ENCOUNTER — Telehealth: Payer: Self-pay | Admitting: Medical Oncology

## 2015-09-19 VITALS — BP 137/86 | HR 71 | Resp 16 | Wt 195.5 lb

## 2015-09-19 DIAGNOSIS — Z510 Encounter for antineoplastic radiation therapy: Secondary | ICD-10-CM | POA: Diagnosis not present

## 2015-09-19 DIAGNOSIS — C50411 Malignant neoplasm of upper-outer quadrant of right female breast: Secondary | ICD-10-CM

## 2015-09-19 NOTE — Progress Notes (Signed)
PREVENT Patient came to see me this morning unannounced, on her way to radiation treatment appointment. Patient informed me she wished to go off study due to her elevated cholesterol lab results. I had informed patient of lab results on 11/28 and asked patient to call me back, call was not returned to me. Patient feels that with her elevated levels that she is on the placebo and this is not benefiting her. I inquired with patient if she had some time to talk about her concern and the next steps. Patient stated she has appt this morning and needed to go but confirmed with me that I may call her. Patient dropped off study dispensed Atorvastatin/placebo bottle and November's medication diary. Per study, patient still has option to stay in study while on open label statin should she choose to be followed. Will discuss with patient this option.  Study medication returned to Pharmacy and Pharmacist, Laddie Aquas. Completed pill count with me, patient returned 157 pills. Adele Dan, RN, BSN Clinical Research 09/19/2015 10:12 AM

## 2015-09-19 NOTE — Progress Notes (Signed)
Weight and vitals stable. Denies pain. Faint hyperpigmentation without desquamation of right chest wall noted. Reports using radiaplex bid as directed. Denies fatigue. Excited to celebrate 9th birthday with her son tonight.  BP 137/86 mmHg  Pulse 71  Resp 16  Wt 195 lb 8 oz (88.678 kg)  SpO2 100% Wt Readings from Last 3 Encounters:  09/19/15 195 lb 8 oz (88.678 kg)  09/05/15 182 lb 3.2 oz (82.645 kg)  08/22/15 194 lb 5 oz (88.14 kg)

## 2015-09-19 NOTE — Progress Notes (Signed)
Weekly Management Note Current Dose: 19.8 Gy  Projected Dose: 50.4 Gy   Narrative:  The patient presents for routine under treatment assessment.  CBCT/MVCT images/Port film x-rays were reviewed.  The chart was checked. Doing well. No complaints. Weight gain continues.   Physical Findings: Slightly dark chest wall.   Impression:  The patient is tolerating radiation.  Plan:  Continue treatment as planned.Continue radiaplex.

## 2015-09-19 NOTE — Telephone Encounter (Signed)
PREVENT: LVMOM F/U call to patient per our brief meeting this morning. Inquired with patient whether she was planning to f/u with her PCP in relation to lipid panel results. Informed patient of study's open label portion of study and to see if this was something she may  be interested in and I can go over it more with her tomorrw. Informed patient if she does choose to completely come off of treatment, will need her to sign a withdrawal of consent form. I asked patient if we could meet tomorrow after her radiation treatment appointment and thanked her for her time.

## 2015-09-20 ENCOUNTER — Ambulatory Visit
Admission: RE | Admit: 2015-09-20 | Discharge: 2015-09-20 | Disposition: A | Discharge status: Home / Self Care | Payer: 59 | Source: Ambulatory Visit | Attending: Radiation Oncology | Admitting: Radiation Oncology

## 2015-09-20 ENCOUNTER — Encounter: Payer: Self-pay | Admitting: Medical Oncology

## 2015-09-20 DIAGNOSIS — Z510 Encounter for antineoplastic radiation therapy: Secondary | ICD-10-CM | POA: Diagnosis not present

## 2015-09-20 DIAGNOSIS — C50411 Malignant neoplasm of upper-outer quadrant of right female breast: Secondary | ICD-10-CM

## 2015-09-20 NOTE — Progress Notes (Signed)
PREVENT: Met with patient, very briefly after her radiation tx appt, in hopes to inform patient of options available in study so patient has all information to make an informed decision regarding withdrawing from study. Patient states she didn't have time today, but will try to meet with me tomorrow. Patient thanked for her time.

## 2015-09-21 ENCOUNTER — Ambulatory Visit
Admission: RE | Admit: 2015-09-21 | Discharge: 2015-09-21 | Disposition: A | Discharge status: Home / Self Care | Payer: 59 | Source: Ambulatory Visit | Attending: Radiation Oncology | Admitting: Radiation Oncology

## 2015-09-21 ENCOUNTER — Encounter: Payer: Self-pay | Admitting: Medical Oncology

## 2015-09-21 DIAGNOSIS — C50411 Malignant neoplasm of upper-outer quadrant of right female breast: Secondary | ICD-10-CM

## 2015-09-21 DIAGNOSIS — Z510 Encounter for antineoplastic radiation therapy: Secondary | ICD-10-CM | POA: Diagnosis not present

## 2015-09-21 NOTE — Progress Notes (Signed)
PREVENT I met this morning with patient in research conference room. Patient stopped by office prior to her radiation treatment appointment. I gave patient a copy of her research drawn labs with results of lipid panel so that she may take to her PCP for evaluation. I also explained to patient that I wanted her to know all the options available to her regarding study so that she may make an informed decision as to what she would like to do. I informed patient that should she choose to, she can continue to be on study even if she decides not to continue with study dispensed atorvastatin/placebo, or should her PCP place her on a statin. Patient may also choose not to do or take any medication in regards to the lipid panel results and remain in study. Patient informed that if she chose no-study or off-study treatment, she would continue with study required scheduled labs, neurocognitive tests and cardiac MRI's that would be provided by the PREVENT study as long as she continues to consent to remain in study. Informed patient that she can also choose to restart study dispensed medication if she so wanted, per Burna Forts at [facility], after her discussion with her PCP. I reiterated to patient that her participation in study is completely voluntary, patient gave verbal understanding. Patient stated she will take the copy of the lab results to her PCP and discuss with PCP what the plan would be regarding lipid panel results and will contact me next week with her decision. All patients questions answered to her satisfaction, she denies further questions at this time. I thanked patient for her time and encouraged her to call Dr. [name] or myself with any questions or concerns she may have. Adele Dan, RN, BSN Clinical Research 09/21/2015 9:49 AM

## 2015-09-22 ENCOUNTER — Ambulatory Visit
Admission: RE | Admit: 2015-09-22 | Discharge: 2015-09-22 | Disposition: A | Discharge status: Home / Self Care | Payer: 59 | Source: Ambulatory Visit | Attending: Radiation Oncology | Admitting: Radiation Oncology

## 2015-09-22 ENCOUNTER — Telehealth: Payer: Self-pay | Admitting: Medical Oncology

## 2015-09-22 DIAGNOSIS — Z510 Encounter for antineoplastic radiation therapy: Secondary | ICD-10-CM | POA: Diagnosis not present

## 2015-09-22 NOTE — Telephone Encounter (Signed)
PREVENT: LVMOM with patient informing her that study assessed cardiac MRI result is normal. Patient to call with questions.

## 2015-09-25 ENCOUNTER — Ambulatory Visit: Payer: 59 | Admitting: Radiation Oncology

## 2015-09-25 ENCOUNTER — Ambulatory Visit
Admission: RE | Admit: 2015-09-25 | Discharge: 2015-09-25 | Disposition: A | Discharge status: Home / Self Care | Payer: 59 | Source: Ambulatory Visit | Attending: Radiation Oncology | Admitting: Radiation Oncology

## 2015-09-25 ENCOUNTER — Encounter: Payer: 59 | Admitting: Radiation Oncology

## 2015-09-25 DIAGNOSIS — Z510 Encounter for antineoplastic radiation therapy: Secondary | ICD-10-CM | POA: Diagnosis not present

## 2015-09-26 ENCOUNTER — Ambulatory Visit
Admission: RE | Admit: 2015-09-26 | Discharge: 2015-09-26 | Disposition: A | Discharge status: Home / Self Care | Payer: 59 | Source: Ambulatory Visit | Attending: Radiation Oncology | Admitting: Radiation Oncology

## 2015-09-26 ENCOUNTER — Encounter: Payer: Self-pay | Admitting: Radiation Oncology

## 2015-09-26 VITALS — BP 132/79 | HR 77 | Temp 98.4°F | Ht 65.5 in | Wt 194.5 lb

## 2015-09-26 DIAGNOSIS — Z510 Encounter for antineoplastic radiation therapy: Secondary | ICD-10-CM | POA: Diagnosis not present

## 2015-09-26 DIAGNOSIS — C50411 Malignant neoplasm of upper-outer quadrant of right female breast: Secondary | ICD-10-CM

## 2015-09-26 NOTE — Progress Notes (Signed)
Phyllis Pruitt presents for her 16th fraction of radiation to her Right CW/Sclav. She denies any fatigue at this time, but does feel a cold coming on, and feels congested. She can feel slight swelling on the Left side of her neck from a possible lymph node. Her Right anterior chest is hyperpigmented, and she reports the skin is becoming more sensitive. She is using the radiaplex twice daily as directed. She has no other concerns at this time.   BP 132/79 mmHg  Pulse 77  Temp(Src) 98.4 F (36.9 C)  Ht 5' 5.5" (1.664 m)  Wt 194 lb 8 oz (88.225 kg)  BMI 31.86 kg/m2

## 2015-09-26 NOTE — Progress Notes (Signed)
Weekly Management Note Current Dose: 28.8 Gy  Projected Dose: 50.4 Gy   Narrative:  The patient presents for routine under treatment assessment.  CBCT/MVCT images/Port film x-rays were reviewed.  The chart was checked. Doing well. No complaints. Weight gain continues.   Physical Findings: Slightly dark chest wall.   Impression:  The patient is tolerating radiation.  Plan:  Continue treatment as planned.Continue radiaplex.

## 2015-09-27 ENCOUNTER — Ambulatory Visit
Admission: RE | Admit: 2015-09-27 | Discharge: 2015-09-27 | Disposition: A | Discharge status: Home / Self Care | Payer: 59 | Source: Ambulatory Visit | Attending: Radiation Oncology | Admitting: Radiation Oncology

## 2015-09-27 DIAGNOSIS — Z510 Encounter for antineoplastic radiation therapy: Secondary | ICD-10-CM | POA: Diagnosis not present

## 2015-09-28 ENCOUNTER — Ambulatory Visit
Admission: RE | Admit: 2015-09-28 | Discharge: 2015-09-28 | Disposition: A | Discharge status: Home / Self Care | Payer: 59 | Source: Ambulatory Visit | Attending: Radiation Oncology | Admitting: Radiation Oncology

## 2015-09-28 DIAGNOSIS — Z510 Encounter for antineoplastic radiation therapy: Secondary | ICD-10-CM | POA: Diagnosis not present

## 2015-09-29 ENCOUNTER — Ambulatory Visit
Admission: RE | Admit: 2015-09-29 | Discharge: 2015-09-29 | Disposition: A | Discharge status: Home / Self Care | Payer: 59 | Source: Ambulatory Visit | Attending: Radiation Oncology | Admitting: Radiation Oncology

## 2015-09-29 DIAGNOSIS — Z510 Encounter for antineoplastic radiation therapy: Secondary | ICD-10-CM | POA: Diagnosis not present

## 2015-10-02 ENCOUNTER — Ambulatory Visit
Admission: RE | Admit: 2015-10-02 | Discharge: 2015-10-02 | Disposition: A | Discharge status: Home / Self Care | Payer: 59 | Source: Ambulatory Visit | Attending: Radiation Oncology | Admitting: Radiation Oncology

## 2015-10-02 DIAGNOSIS — Z510 Encounter for antineoplastic radiation therapy: Secondary | ICD-10-CM | POA: Diagnosis not present

## 2015-10-03 ENCOUNTER — Telehealth: Payer: Self-pay | Admitting: Medical Oncology

## 2015-10-03 ENCOUNTER — Ambulatory Visit
Admission: RE | Admit: 2015-10-03 | Discharge: 2015-10-03 | Disposition: A | Discharge status: Home / Self Care | Payer: 59 | Source: Ambulatory Visit | Attending: Radiation Oncology | Admitting: Radiation Oncology

## 2015-10-03 ENCOUNTER — Encounter: Payer: Self-pay | Admitting: Radiation Oncology

## 2015-10-03 VITALS — BP 134/85 | HR 87 | Resp 16 | Wt 196.1 lb

## 2015-10-03 DIAGNOSIS — C50411 Malignant neoplasm of upper-outer quadrant of right female breast: Secondary | ICD-10-CM

## 2015-10-03 DIAGNOSIS — Z510 Encounter for antineoplastic radiation therapy: Secondary | ICD-10-CM | POA: Diagnosis not present

## 2015-10-03 NOTE — Progress Notes (Signed)
Weight and vitals stable. Denies pain. Faint hyperpigmentation without desquamation of right/treated chest wall. Reports tenderness is felt on her right chest wall. Reports using radiaplex bid as directed. Denies fatigue.   BP 134/85 mmHg  Pulse 87  Resp 16  Wt 196 lb 1.6 oz (88.95 kg)  SpO2 100% Wt Readings from Last 3 Encounters:  10/03/15 196 lb 1.6 oz (88.95 kg)  09/26/15 194 lb 8 oz (88.225 kg)  09/19/15 195 lb 8 oz (88.678 kg)

## 2015-10-03 NOTE — Telephone Encounter (Signed)
PREVENT: Follow up call to patient regarding our previous discussion on 12/8. Inquired with patient as to what her decision was regarding participation in study. Patient states that she has given her PCP, Dr. Laurann Montana, the study lipid panel results. Per patient, she has not heard back from PCP regarding a plan of care for the increased lipid panel. Patient states she would like to continue to stay on study for now and be followed w/out the atorvastin/placebo until she hears further from her PCP, patient asking if our office can call to PCP to see if we could get a response regarding results. Informed patient will discuss with Dr. [name].  Mssg forwarded to Dr. [name] for review.

## 2015-10-03 NOTE — Progress Notes (Signed)
  Radiation Oncology         (336) [phone] ________________________________  Name: Phyllis Pruitt MRN: [id number]  Date: 10/03/2015  DOB: 1970/11/01  Weekly Radiation Therapy Management    ICD-9-CM ICD-10-CM   1. Breast cancer of upper-outer quadrant of right female breast ([place]) 174.4 C50.411      Current Dose: 37.8 Gy     Planned Dose:  50.4 Gy  Narrative . . . . . . . . The patient presents for routine under treatment assessment.                                   The patient is without complaint. Some tenderness along the right chest wall area. She denies any fatigue                                 Set-up films were reviewed.                                 The chart was checked. Physical Findings. . .  weight is 196 lb 1.6 oz (88.95 kg). Her blood pressure is 134/85 and her pulse is 87. Her respiration is 16 and oxygen saturation is 100%. . The lungs are clear. The heart has a regular rhythm and rate. The right chest wall area shows hyperpigmentation changes without desquamation Impression . . . . . . . The patient is tolerating radiation. Plan . . . . . . . . . . . . Continue treatment as planned.  ________________________________   Blair Promise, PhD, MD

## 2015-10-04 ENCOUNTER — Ambulatory Visit
Admission: RE | Admit: 2015-10-04 | Discharge: 2015-10-04 | Disposition: A | Discharge status: Home / Self Care | Payer: 59 | Source: Ambulatory Visit | Attending: Radiation Oncology | Admitting: Radiation Oncology

## 2015-10-04 DIAGNOSIS — Z510 Encounter for antineoplastic radiation therapy: Secondary | ICD-10-CM | POA: Diagnosis not present

## 2015-10-05 ENCOUNTER — Ambulatory Visit
Admission: RE | Admit: 2015-10-05 | Discharge: 2015-10-05 | Disposition: A | Discharge status: Home / Self Care | Payer: 59 | Source: Ambulatory Visit | Attending: Radiation Oncology | Admitting: Radiation Oncology

## 2015-10-05 DIAGNOSIS — Z510 Encounter for antineoplastic radiation therapy: Secondary | ICD-10-CM | POA: Diagnosis not present

## 2015-10-06 ENCOUNTER — Ambulatory Visit
Admission: RE | Admit: 2015-10-06 | Discharge: 2015-10-06 | Disposition: A | Discharge status: Home / Self Care | Payer: 59 | Source: Ambulatory Visit | Attending: Radiation Oncology | Admitting: Radiation Oncology

## 2015-10-06 DIAGNOSIS — Z510 Encounter for antineoplastic radiation therapy: Secondary | ICD-10-CM | POA: Diagnosis not present

## 2015-10-10 ENCOUNTER — Ambulatory Visit
Admission: RE | Admit: 2015-10-10 | Discharge: 2015-10-10 | Disposition: A | Discharge status: Home / Self Care | Payer: 59 | Source: Ambulatory Visit | Attending: Radiation Oncology | Admitting: Radiation Oncology

## 2015-10-10 ENCOUNTER — Encounter: Payer: Self-pay | Admitting: Radiation Oncology

## 2015-10-10 ENCOUNTER — Encounter: Payer: Self-pay | Admitting: *Deleted

## 2015-10-10 VITALS — BP 133/86 | HR 71 | Temp 98.2°F | Ht 65.5 in | Wt 197.3 lb

## 2015-10-10 DIAGNOSIS — C50411 Malignant neoplasm of upper-outer quadrant of right female breast: Secondary | ICD-10-CM | POA: Diagnosis present

## 2015-10-10 DIAGNOSIS — Z510 Encounter for antineoplastic radiation therapy: Secondary | ICD-10-CM | POA: Diagnosis not present

## 2015-10-10 NOTE — Progress Notes (Signed)
Phyllis Pruitt is here for her 25th fraction of radiation to her Right Chest Wall/Sclav. She denies any fatigue at this time. Her Right Chest is hyperpigmented and tender. She has an open area at her axilla, with some mild peeling of skin. I have recommended neosporin for this area. She also has a very tender area under her breast, that appears red. She reports this area has been peeling slightly. She is using the radiaplex cream to these areas. She was given a follow up appointment, but declined any survivorship pamplets at this time. She has no other concerns at this time.   BP 133/86 mmHg  Pulse 71  Temp(Src) 98.2 F (36.8 C)  Ht 5' 5.5" (1.664 m)  Wt 197 lb 4.8 oz (89.495 kg)  BMI 32.32 kg/m2

## 2015-10-10 NOTE — Progress Notes (Signed)
Weekly Management Note Current Dose:  45 Gy  Projected Dose: 50.4 Gy   Narrative:  The patient presents for routine under treatment assessment.  CBCT/MVCT images/Port film x-rays were reviewed.  The chart was checked.  Ms. Wehrli is here for her 25th fraction of radiation to her Right Chest Wall/Sclav. She denies any fatigue at this time. Her Right Chest is hyperpigmented and tender. She has an open area at her axilla, with some mild peeling of skin. The nurse has recommended neosporin for this area. She is using the radiaplex cream to these areas.  Physical Findings: Weight: 197 lb 4.8 oz (89.495 kg). Hyperpigmentation of the right chest wall with an area of moist desquamation in the right axilla.  Impression:  The patient is tolerating radiation.  Plan:  Continue treatment as planned. Follow up in 1 month. Needs [place] follow up as well. Declined FYNN and survivorship. I cautioned her to decrease any chest lifting/planks/ pushups for 2 weeks.  due to irritation of the chest wall due to radiation.   This document serves as a record of services personally performed by Thea Silversmith, MD. It was created on her behalf by Darcus Austin, a trained medical scribe. The creation of this record is based on the scribe's personal observations and the provider's statements to them. This document has been checked and approved by the attending provider.

## 2015-10-11 ENCOUNTER — Telehealth: Payer: Self-pay | Admitting: Hematology and Oncology

## 2015-10-11 ENCOUNTER — Telehealth: Payer: Self-pay | Admitting: Medical Oncology

## 2015-10-11 ENCOUNTER — Ambulatory Visit
Admission: RE | Admit: 2015-10-11 | Discharge: 2015-10-11 | Disposition: A | Discharge status: Home / Self Care | Payer: 59 | Source: Ambulatory Visit | Attending: Radiation Oncology | Admitting: Radiation Oncology

## 2015-10-11 DIAGNOSIS — Z510 Encounter for antineoplastic radiation therapy: Secondary | ICD-10-CM | POA: Diagnosis not present

## 2015-10-11 NOTE — Telephone Encounter (Signed)
PREVENT: Call to patient to inform her that Dr. [name] had spoken to patient's PCP Dr. Laurann Montana regarding any plans of placing patient on statins d/t elevated lipid panel results from research labs. Informed patient that per Dr. [name], Dr. Laurann Montana does not feel there is a need for patient to start a statin and does not plan on prescribing statin to patient. Patient reports to have not received a return call from her PCP after giving lab results to PCP office. I inquired what patient's thoughts and wishes were regarding study so that I know how to proceed from here. Patient verbalized that she would like to stay on study without treatment of study dispensed atorvastatin/placebo, and agrees to be followed and have clinical data collected. I informed patient there is a Withdrawal of Treatment Consent form for her to sign expressing her wishes, patient gave verbal understanding and asked to come in tomorrow morning to sign. Will meet in morning with patient to go over Withdrawal of Treatment Consent form. I thanked patient for her time and continued support in study.

## 2015-10-11 NOTE — Telephone Encounter (Signed)
Called and left a message with follow up per pof

## 2015-10-12 ENCOUNTER — Ambulatory Visit
Admission: RE | Admit: 2015-10-12 | Discharge: 2015-10-12 | Disposition: A | Discharge status: Home / Self Care | Payer: 59 | Source: Ambulatory Visit | Attending: Radiation Oncology | Admitting: Radiation Oncology

## 2015-10-12 ENCOUNTER — Encounter: Payer: Self-pay | Admitting: Medical Oncology

## 2015-10-12 DIAGNOSIS — Z510 Encounter for antineoplastic radiation therapy: Secondary | ICD-10-CM | POA: Diagnosis not present

## 2015-10-12 DIAGNOSIS — C50411 Malignant neoplasm of upper-outer quadrant of right female breast: Secondary | ICD-10-CM

## 2015-10-12 NOTE — Progress Notes (Signed)
PREVENT: I met with patient this morning, patient here alone, in research meeting room. I reviewed the one page Withdrawal of Treatment Consent with patient and answered patient's questions. Patient signed the Withdrawal of Treatment Consent and consents to continue being followed on study and clinical data to be collected from her medical records and confirms understanding that she will not be receiving study dispensed atorvastatin/placebo. I informed patient that the next study required event was the 12 month, in May 2017, neurocognitive test, collection of concomitant medications and any A/E's, patient gave verbal understanding to this. Patient signed consent with her new legal name of Phyllis Pruitt (in system), patient married after start of study when her name was Phyllis Pruitt. I thanked patient for her time and continued support of study. Patient denies any questions at this time and I encouraged her to call me or Dr. [name] with any questions she may have. Copy of today's signed consent provided to patient.  Adele Dan, RN, Clinical Research 10/12/2015 9:09 AM

## 2015-10-13 ENCOUNTER — Encounter: Payer: Self-pay | Admitting: Radiation Oncology

## 2015-10-13 ENCOUNTER — Ambulatory Visit
Admission: RE | Admit: 2015-10-13 | Discharge: 2015-10-13 | Disposition: A | Discharge status: Home / Self Care | Payer: 59 | Source: Ambulatory Visit | Attending: Radiation Oncology | Admitting: Radiation Oncology

## 2015-10-13 DIAGNOSIS — Z510 Encounter for antineoplastic radiation therapy: Secondary | ICD-10-CM | POA: Diagnosis not present

## 2015-10-17 ENCOUNTER — Telehealth: Payer: Self-pay | Admitting: *Deleted

## 2015-10-17 ENCOUNTER — Other Ambulatory Visit: Payer: Self-pay | Admitting: Adult Health

## 2015-10-17 DIAGNOSIS — C50411 Malignant neoplasm of upper-outer quadrant of right female breast: Secondary | ICD-10-CM

## 2015-10-17 NOTE — Telephone Encounter (Signed)
Spoke with patient to follow up after radiation completion.  She is doing well with no complaints.  Encouraged her to call with any needs or concerns.  She is aware of her appointment with dr. [name] 1/11.

## 2015-10-18 ENCOUNTER — Telehealth: Payer: Self-pay | Admitting: Hematology and Oncology

## 2015-10-18 NOTE — Telephone Encounter (Signed)
Left message for patient re 2/2 SCP visit. Per 1/3 pof coordinate with 2/2 Dr. [name] appointment. Also confirmed 1/11 f/u w/VG and mailed schedule.

## 2015-10-24 NOTE — Assessment & Plan Note (Signed)
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 0.6 cm; 3/13 LN positive, T1c N1 M0 stage IIA, ER/PR HER-2 negative. Adjuvant XRT 09/03/15 to 10/13/15  Treatment Plan: 1. Adjuvant Xeloda X 6 cycles 2. 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 She cannot function at work because she is unable to type or stand on her feet for any length of time. I support her application for disability. She also has difficulty with buttoning her shirt and holding onto objects because of neuropathy.  RTC in 2 weeks after starting Xeloda.

## 2015-10-25 ENCOUNTER — Ambulatory Visit (HOSPITAL_BASED_OUTPATIENT_CLINIC_OR_DEPARTMENT_OTHER): Payer: 59 | Admitting: Hematology and Oncology

## 2015-10-25 ENCOUNTER — Encounter: Payer: Self-pay | Admitting: Hematology and Oncology

## 2015-10-25 ENCOUNTER — Telehealth: Payer: Self-pay | Admitting: Hematology and Oncology

## 2015-10-25 ENCOUNTER — Encounter: Payer: Self-pay | Admitting: *Deleted

## 2015-10-25 VITALS — BP 137/81 | HR 76 | Temp 98.3°F | Resp 18 | Ht 65.5 in | Wt 197.6 lb

## 2015-10-25 DIAGNOSIS — C773 Secondary and unspecified malignant neoplasm of axilla and upper limb lymph nodes: Secondary | ICD-10-CM | POA: Diagnosis not present

## 2015-10-25 DIAGNOSIS — G620 Drug-induced polyneuropathy: Secondary | ICD-10-CM

## 2015-10-25 DIAGNOSIS — T451X5A Adverse effect of antineoplastic and immunosuppressive drugs, initial encounter: Secondary | ICD-10-CM

## 2015-10-25 DIAGNOSIS — Z171 Estrogen receptor negative status [ER-]: Secondary | ICD-10-CM

## 2015-10-25 DIAGNOSIS — R635 Abnormal weight gain: Secondary | ICD-10-CM

## 2015-10-25 DIAGNOSIS — N2889 Other specified disorders of kidney and ureter: Secondary | ICD-10-CM

## 2015-10-25 DIAGNOSIS — C50411 Malignant neoplasm of upper-outer quadrant of right female breast: Secondary | ICD-10-CM

## 2015-10-25 MED ORDER — CAPECITABINE 500 MG PO TABS: 1000.0000 mg/m2 | ORAL_TABLET | Freq: Two times a day (BID) | ORAL | Status: DC

## 2015-10-25 NOTE — Addendum Note (Signed)
Addended by: Prentiss Bells on: 10/25/2015 12:13 PM   Modules accepted: Medications

## 2015-10-25 NOTE — Progress Notes (Signed)
Scheduled CT [place] 1/18 arrive 815 for 830 scan, NPO, 1st bottle contrast 6 am, 2nd bottle 7 am.  Pt voiced understanding.

## 2015-10-25 NOTE — Telephone Encounter (Signed)
Appointments made and avs printed,contrast given °

## 2015-10-25 NOTE — Progress Notes (Signed)
  Radiation Oncology         (336) [phone] ________________________________  Name: Phyllis Pruitt MRN: [id number]  Date: 10/13/2015  DOB: June 25, 1971  End of Treatment Note  Diagnosis:  Breast cancer of upper-outer quadrant of right female breast [facility])   Staging form: Breast, AJCC 7th Edition     Clinical stage from 02/08/2015: Stage IIIA (T3, N1, M0) - Unsigned   Indication for treatment:  Curative      Radiation treatment dates:   09/03/15-10/13/15  Site/dose:     Right chest wall/50.4 Pearline Cables @ 1.8 Pearline Cables per fraction x 28 fractions Right supraclavicular fossa 45 Gy @1 .8 Gy per fraction x 25 fractions   Beams/energy:  Opposed Tangents / 6 MV photons LAO 10  MV photons  Narrative: The patient tolerated radiation treatment relatively well.   She had fatigue and hyperpigmentation. She was most concerned about weight gain during treatment.   Plan: The patient has completed radiation treatment. The patient will return to radiation oncology clinic for routine followup in one month. I advised them to call or return sooner if they have any questions or concerns related to their recovery or treatment.  ------------------------------------------------  Thea Silversmith, MD

## 2015-10-25 NOTE — Progress Notes (Signed)
Patient Care Team: [name], MD as PCP - General (Family Medicine) Chauncey Cruel, MD as Consulting Physician (Oncology) Thea Silversmith, MD as Consulting Physician (Radiation Oncology) Mauro Kaufmann, RN as Registered Nurse Rockwell Germany, RN as Registered Nurse Alphonsa Overall, MD as Consulting Physician (General Surgery)  DIAGNOSIS: Breast cancer of upper-outer quadrant of right female breast [facility])   Staging form: Breast, AJCC 7th Edition     Clinical stage from 02/08/2015: Stage IIIA (T3, N1, M0) - Unsigned   SUMMARY OF ONCOLOGIC HISTORY:   Breast cancer of upper-outer quadrant of right female breast ([place])   01/27/2015 Initial Diagnosis Right breast 9:00 and 10:00 biopsy: Grade 3 IDC ER/PR negative, HER-2 negative, Ki-67 77% and 73%; the right axillary lymph node biopsy: EF 22% positive   01/30/2015 Breast MRI Right breast masses 9 and 10:00 position, additional irregular masses with non-mass enhancement upper outer quadrant with one mass extending into upper inner quadrant, multifocal, multicentric 13.7 x 4.6 x 4.6 cm, malignant right axillary lymph node   02/15/2015 Imaging No evidence of metastatic disease, 2.2 cm enhancing lesion anterior left lower kidney suspicious for solid renal neoplasm   02/20/2015 -  Neo-Adjuvant Chemotherapy Adriamycin and cytoxan dose dense Q 2 weeks X 4 foll by Abraxane weekly x 12   07/10/2015 Breast MRI Multiple irregular enhancing masses right breast are less confluent and smaller dominant mass 10:00 2.1 x 1.9 x 1.3 cm; right breast 11:00 mass measures 1.2 x 1.2 x 0.8 cm, decreased right axillary lymph node thickening   07/28/2015 Surgery Rt mastectomy: Multifocal IDC 2 cm, 1.5 cm. 0.6 cm; 3/13 LN positive, T1c N1 M0 stage IIA, ER/PR HER-2 negative and the lymph nodes   09/04/2015 - 10/13/2015 Radiation Therapy adjuvant radiation therapy    CHIEF COMPLIANT: follow-up after radiation therapy is complete  INTERVAL HISTORY: CLARISSIA Pruitt is a  45 year old with above-mentioned history of right breast triple negative cancer who underwent neoadjuvant chemotherapy followed by a Network engineer. She still had residual disease and subsequent undergone radiation therapy. She has radiation dermatitis but otherwise recovering very well. Her biggest complaint today is severe weight gain. She has been doing exercises and still not losing weight. She is very frustrated about it.she is also here to discuss adjuvant treatment options.  REVIEW OF SYSTEMS:   Constitutional: Denies fevers, chills or abnormal weight loss Eyes: Denies blurriness of vision Ears, nose, mouth, throat, and face: Denies mucositis or sore throat Respiratory: Denies cough, dyspnea or wheezes Cardiovascular: Denies palpitation, chest discomfort Gastrointestinal:  Denies nausea, heartburn or change in bowel habits Skin: Denies abnormal skin rashes Lymphatics: Denies new lymphadenopathy or easy bruising Neurological:neuropathy of the hands and feet improved from before Behavioral/Psych: Mood is stable, no new changes  Extremities: No lower extremity edema All other systems were reviewed with the patient and are negative.  I have reviewed the past medical history, past surgical history, social history and family history with the patient and they are unchanged from previous note.  ALLERGIES:  has No Known Allergies.  MEDICATIONS:  Current Outpatient Prescriptions  Medication Sig Dispense Refill  . capecitabine (XELODA) 500 MG tablet Take 4 tablets (2,000 mg total) by mouth 2 (two) times daily after a meal. 112 tablet 7  . Diphenhydramine-APAP, sleep, (TYLENOL PM EXTRA STRENGTH PO) Take by mouth.    . Ginger, Zingiber officinalis, (GINGER ROOT PO) Take 1 tablet by mouth daily.     Marland Kitchen HYDROcodone-acetaminophen (NORCO/VICODIN) 5-325 MG tablet Take 1-2 tablets by mouth  every 4 (four) hours as needed for moderate pain. (Patient not taking: Reported on 09/05/2015) 40 tablet 0  . Multiple  Vitamin (MULTIVITAMIN WITH MINERALS) TABS tablet Take 1 tablet by mouth daily.    . naproxen sodium (ANAPROX) 220 MG tablet Take 220 mg by mouth 2 (two) times daily with a meal. As needed for breast pain    . Probiotic Product (PROBIOTIC PO) Take 1 tablet by mouth daily.    . TURMERIC CURCUMIN PO Take 1 capsule by mouth daily.      No current facility-administered medications for this visit.    PHYSICAL EXAMINATION: ECOG PERFORMANCE STATUS: 1 - Symptomatic but completely ambulatory  Filed Vitals:   10/25/15 0916  BP: 137/81  Pulse: 76  Temp: 98.3 F (36.8 C)  Resp: 18   Filed Weights   10/25/15 0916  Weight: 197 lb 9.6 oz (89.631 kg)    GENERAL:alert, no distress and comfortable SKIN: skin color, texture, turgor are normal, no rashes or significant lesions EYES: normal, Conjunctiva are pink and non-injected, sclera clear OROPHARYNX:no exudate, no erythema and lips, buccal mucosa, and tongue normal  NECK: supple, thyroid normal size, non-tender, without nodularity LYMPH:  no palpable lymphadenopathy in the cervical, axillary or inguinal LUNGS: clear to auscultation and percussion with normal breathing effort HEART: regular rate & rhythm and no murmurs and no lower extremity edema ABDOMEN:abdomen soft, non-tender and normal bowel sounds MUSCULOSKELETAL:no cyanosis of digits and no clubbing  NEURO: alert & oriented x 3 with fluent speech, no focal motor/sensory deficits EXTREMITIES: No lower extremity edema  LABORATORY DATA:  I have reviewed the data as listed   Chemistry      Component Value Date/Time   NA 139 07/26/2015 0850   NA 141 07/12/2015 1019   K 3.4* 07/26/2015 0850   K 3.7 07/12/2015 1019   CL 104 07/26/2015 0850   CO2 30 07/26/2015 0850   CO2 27 07/12/2015 1019   BUN 6 07/26/2015 0850   BUN 8.0 07/12/2015 1019   CREATININE 0.68 07/26/2015 0850   CREATININE 0.7 07/12/2015 1019      Component Value Date/Time   CALCIUM 9.0 07/26/2015 0850   CALCIUM 9.1  07/12/2015 1019   ALKPHOS 68 07/26/2015 0850   ALKPHOS 72 07/12/2015 1019   AST 21 07/26/2015 0850   AST 19 07/12/2015 1019   ALT 23 07/26/2015 0850   ALT 17 07/12/2015 1019   BILITOT 0.5 07/26/2015 0850   BILITOT 0.30 07/12/2015 1019       Lab Results  Component Value Date   WBC 6.6 07/26/2015   HGB 11.4* 07/26/2015   HCT 34.0* 07/26/2015   MCV 82.7 07/26/2015   PLT 264 07/26/2015   NEUTROABS 5.0 07/26/2015     ASSESSMENT & PLAN:  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 0.6 cm; 3/13 LN positive, T1c N1 M0 stage IIA, ER/PR HER-2 negative. Adjuvant XRT 09/03/15 to 10/13/15  Treatment Plan: Adjuvant Xeloda X 6 months (8 cycles) I discussed risks and benefits of Xeloda including the risk of diarrhea and hand-foot  syndrome. She would benefit from 2000 mg by mouth twice a day. However for the first month instructed her to start at 1500 mg by mouth twice a day. I plan to start Xeloda February 1 week.  Severe peripheral neuropathy: this is markedly better than before. She still has numbness in the fingers and toes. Weight gain: Patient is struggling with this more than anything else. Renal mass: We will obtain another CT of the abdomen to analyze this.  Return to clinic in third week of February to discuss tolerability to treatment and for beginning of second cycle of Xeloda.  Orders Placed This Encounter  Procedures  . CT Abdomen Pelvis W Contrast    Standing Status: Future     Number of Occurrences:      Standing Expiration Date: 10/24/2016     Order Specific Question:  If indicated for the ordered procedure, I authorize the administration of contrast media per Radiology protocol    Answer:  Yes    Order Specific Question:  Reason for Exam (SYMPTOM  OR DIAGNOSIS REQUIRED)    Answer:  Renal mass evaluation    Order Specific Question:  Is the patient pregnant?    Answer:  No    Order Specific Question:  Preferred imaging location?    Answer:  [facility]  . CBC with Differential    Standing Status: Future     Number of Occurrences:      Standing Expiration Date: 10/24/2016  . Comprehensive metabolic panel    Standing Status: Future     Number of Occurrences:      Standing Expiration Date: 10/24/2016   The patient has a good understanding of the overall plan. she agrees with it. she will call with any problems that may develop before the next visit here.   Rulon Eisenmenger, MD 10/25/2015

## 2015-10-27 ENCOUNTER — Telehealth: Payer: Self-pay | Admitting: Pharmacist

## 2015-10-27 NOTE — Telephone Encounter (Signed)
10/27/15: New Rx for Xeloda faxed to [facility]

## 2015-11-01 ENCOUNTER — Ambulatory Visit (HOSPITAL_COMMUNITY)
Admission: RE | Admit: 2015-11-01 | Discharge: 2015-11-01 | Disposition: A | Discharge status: Home / Self Care | Payer: 59 | Source: Ambulatory Visit | Attending: Hematology and Oncology | Admitting: Hematology and Oncology

## 2015-11-01 ENCOUNTER — Other Ambulatory Visit: Payer: Self-pay | Admitting: Hematology and Oncology

## 2015-11-01 DIAGNOSIS — Z9221 Personal history of antineoplastic chemotherapy: Secondary | ICD-10-CM | POA: Insufficient documentation

## 2015-11-01 DIAGNOSIS — C50411 Malignant neoplasm of upper-outer quadrant of right female breast: Secondary | ICD-10-CM | POA: Insufficient documentation

## 2015-11-01 DIAGNOSIS — N2889 Other specified disorders of kidney and ureter: Secondary | ICD-10-CM | POA: Insufficient documentation

## 2015-11-01 DIAGNOSIS — Z923 Personal history of irradiation: Secondary | ICD-10-CM | POA: Diagnosis not present

## 2015-11-01 DIAGNOSIS — Z9011 Acquired absence of right breast and nipple: Secondary | ICD-10-CM | POA: Insufficient documentation

## 2015-11-01 MED ORDER — IOHEXOL 300 MG/ML  SOLN
100.0000 mL | Freq: Once | INTRAMUSCULAR | Status: AC | PRN
  Administered 2015-11-01: 100 mL via INTRAVENOUS

## 2015-11-03 ENCOUNTER — Encounter: Payer: Self-pay | Admitting: Hematology and Oncology

## 2015-11-03 NOTE — Progress Notes (Signed)
I advised Page Spiro is still pending per message left by [facility].

## 2015-11-07 NOTE — Telephone Encounter (Signed)
11/07/15: [facility] unable to fill. Rx sent to [facility] on 11/03/15. [facility] unable to fill Xeloda. Rx to be sent to [facility]

## 2015-11-08 ENCOUNTER — Other Ambulatory Visit: Payer: Self-pay | Admitting: *Deleted

## 2015-11-08 ENCOUNTER — Encounter: Payer: Self-pay | Admitting: Hematology and Oncology

## 2015-11-08 ENCOUNTER — Telehealth: Payer: Self-pay | Admitting: *Deleted

## 2015-11-08 DIAGNOSIS — C50411 Malignant neoplasm of upper-outer quadrant of right female breast: Secondary | ICD-10-CM

## 2015-11-08 NOTE — Progress Notes (Signed)
Per [facility] script is now filled with [facility]  [id number] no longer with [facility]

## 2015-11-08 NOTE — Telephone Encounter (Signed)
Received call from patient stating she has noticed some swelling starting in the back of her hand going up arm not quite to her elbow.  She denies any pain or redness.  Per Dr. [name] refer to PT for lymphedema.  Dr. [name] would also like her to follow up with Urology concerning her renal mass.  Apt. Made for 2/24 with Dr. Alyson Ingles at [facility].  Patient aware the need for the appointment as the mass has increased in size.

## 2015-11-09 ENCOUNTER — Ambulatory Visit: Payer: 59 | Attending: Hematology and Oncology | Admitting: Physical Therapy

## 2015-11-09 ENCOUNTER — Encounter: Payer: Self-pay | Admitting: Physical Therapy

## 2015-11-09 DIAGNOSIS — M25611 Stiffness of right shoulder, not elsewhere classified: Secondary | ICD-10-CM

## 2015-11-09 DIAGNOSIS — I972 Postmastectomy lymphedema syndrome: Secondary | ICD-10-CM

## 2015-11-09 DIAGNOSIS — R293 Abnormal posture: Secondary | ICD-10-CM | POA: Insufficient documentation

## 2015-11-09 DIAGNOSIS — C50911 Malignant neoplasm of unspecified site of right female breast: Secondary | ICD-10-CM | POA: Diagnosis present

## 2015-11-09 DIAGNOSIS — C50411 Malignant neoplasm of upper-outer quadrant of right female breast: Secondary | ICD-10-CM | POA: Insufficient documentation

## 2015-11-09 DIAGNOSIS — Z9189 Other specified personal risk factors, not elsewhere classified: Secondary | ICD-10-CM | POA: Diagnosis present

## 2015-11-09 NOTE — Patient Instructions (Signed)
Closed Chain: Shoulder Abduction / Adduction - on Wall    One hand on wall, step to side and return. Stepping causes shoulder to abduct and adduct. Step _5__ times, ohlding 10 seconds, _3__ times per day.  http://ss.exer.us/267   Copyright  VHI. All rights reserved.  Closed Chain: Shoulder Flexion / Extension - on Wall    Hands on wall, step backward. Return. Stepping causes shoulder flexion and extension Do _5__ times, holding 10 seconds, _3__ times per day.  http://ss.exer.us/265   Copyright  VHI. All rights reserved.

## 2015-11-09 NOTE — Therapy (Signed)
[place], [place], [address] Phone: [phone]   Fax:  [phone]  Physical Therapy Evaluation  Patient Details  Name: Phyllis Pruitt MRN: [id number] Date of Birth: 1970/12/23 Referring Provider: Dr. Lurline Del  Encounter Date: 11/09/2015      PT End of Session - 11/09/15 1142    Visit Number 1   Number of Visits 8   Date for PT Re-Evaluation 12/07/15   PT Start Time 0920   PT Stop Time 1015   PT Time Calculation (min) 55 min   Activity Tolerance Patient tolerated treatment well   Behavior During Therapy [facility] for tasks assessed/performed      Past Medical History  Diagnosis Date  . Breast cancer of upper-outer quadrant of right female breast ([place]) 01/27/2015  . Depression   . Neuropathy ([place])   . Sickle cell trait [facility])     Past Surgical History  Procedure Laterality Date  . Tubal ligation    . Tonsillectomy    . Wisdome teeth extraction    . Portacath placement N/A 02/10/2015    Procedure: INSERTION PORT-A-CATH WITH ULTRA SOUND, left subclavian,;  Surgeon: Alphonsa Overall, MD;  Location: [facility];  Service: General;  Laterality: N/A;  . Mastectomy modified radical Right 07/28/2015  . Port-a-cath removal  07/28/2015  . Mastectomy modified radical Right 07/28/2015    Procedure: RIGHT MODIFIED RADICAL MASTECTOMY;  Surgeon: Alphonsa Overall, MD;  Location: [place];  Service: General;  Laterality: Right;  . Port-a-cath removal  07/28/2015    Procedure: REMOVAL PORT-A-CATH;  Surgeon: Alphonsa Overall, MD;  Location: [place];  Service: General;;    There were no vitals filed for this visit.  Visit Diagnosis:  Post-mastectomy lymphedema syndrome  Shoulder stiffness, right  Malignant neoplasm of right female breast, unspecified site of breast [facility])      Subjective Assessment - 11/09/15 0924    Subjective Pt reports she felt like her right arm had tenderness on 11/06/15 and then it worsened on  11/07/15.  She feels like the back of her hand looks glossy and feels hot.  She called the breast nurse navigator and asked for a referral to PT.   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  Neoadjuvant chemotherapy ended 07/10/15 followed by a right mastectomy with axillary node dissection (13 nodes removed with 3 positive) on 07/28/15 followed by radiation which ended 10/13/15.  She also has a left kidney tumor that she needs to have removed.   Patient Stated Goals Prevent worsening of lymphedema   Currently in Pain? Yes   Pain Score 4    Pain Location Hand   Pain Orientation Right   Pain Descriptors / Indicators Tingling   Pain Type Neuropathic pain   Pain Onset More than a month ago   Pain Frequency Intermittent   Aggravating Factors  unknown   Pain Relieving Factors unknown   Multiple Pain Sites No            [facility] PT Assessment - 11/09/15 0001    Assessment   Medical Diagnosis Right arm lymphedema s/p right mastectomy and ALND   Referring Provider Dr. Sarajane Jews Magrinat   Onset Date/Surgical Date 07/28/15   Hand Dominance Left   Prior Therapy Baseline assessment at time of diagnosis   Precautions   Precautions Other (comment)  s/p chemo, mastectomy, radiation; has tumor in left kidney   Restrictions   Weight Bearing Restrictions No   Balance Screen  Has the patient fallen in the past 6 months No   Has the patient had a decrease in activity level because of a fear of falling?  No   Is the patient reluctant to leave their home because of a fear of falling?  No   Home Environment   Living Environment Private residence   Living Arrangements Spouse/significant other;Children  Husband and 22 and 35 y.o. 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   Leisure Zumba and walking at  least 2 hours total per day   Cognition   Overall Cognitive Status Within Functional Limits for tasks assessed   Observation/Other Assessments   Observations Lymphedema Life Impact Scale score 19 which is 28%   Posture/Postural Control   Posture/Postural Control Postural limitations   Postural Limitations Rounded Shoulders;Forward head   ROM / Strength   AROM / PROM / Strength AROM   AROM   AROM Assessment Site Shoulder   Right/Left Shoulder Right;Left   Right Shoulder Extension 46 Degrees   Right Shoulder Flexion 126 Degrees   Right Shoulder ABduction 136 Degrees   Right Shoulder Internal Rotation 70 Degrees   Right Shoulder External Rotation 81 Degrees   Left Shoulder Extension 48 Degrees   Left Shoulder Flexion 151 Degrees   Left Shoulder ABduction 161 Degrees   Left Shoulder Internal Rotation 66 Degrees   Left Shoulder External Rotation 90 Degrees           LYMPHEDEMA/ONCOLOGY QUESTIONNAIRE - 11/09/15 0942    Type   Cancer Type s/p right mastectomy and ALND   Surgeries   Mastectomy Date 07/28/15   Axillary Lymph Node Dissection Date 07/28/15   Number Lymph Nodes Removed 13   Date Lymphedema/Swelling Started   Date 11/06/15   Treatment   Active Chemotherapy Treatment No   Past Chemotherapy Treatment Yes   Date 07/10/15   Active Radiation Treatment No   Past Radiation Treatment Yes   Date 10/13/15   Body Site right breast and axilla   Current Hormone Treatment No   Past Hormone Therapy No   What other symptoms do you have   Are you Having Heaviness or Tightness Yes   Are you having Pain Yes   Are you having pitting edema No   Is it Hard or Difficult finding clothes that fit No   Do you have infections No   Is there Decreased scar mobility Yes   Stemmer Sign No   Lymphedema Stage   Stage STAGE 2 SPONTANEOUSLY IRREVERSIBLE   Right Upper Extremity Lymphedema   10 cm Proximal to Olecranon Process 32.5 cm   Olecranon Process 28.2 cm   10 cm Proximal to Ulnar  Styloid Process 23.3 cm   Just Proximal to Ulnar Styloid Process 17 cm   Across Hand at [employer] 20.2 cm   At [place] of 2nd Digit 6.2 cm   Left Upper Extremity Lymphedema   10 cm Proximal to Olecranon Process 31.5 cm   Olecranon Process 26.8 cm   10 cm Proximal to Ulnar Styloid Process 22.1 cm   Just Proximal to Ulnar Styloid Process 16 cm   Across Hand at [employer] 19.8 cm   At [place] of 2nd Digit 6.3 cm  PT Education - 11/09/15 1147    Education provided Yes   Education Details Options for getting fitting for a compression sleeve and glove: Rexford Maus, [facility], [facility]   Person(s) Educated Patient   Methods Explanation;Handout   Comprehension Verbalized understanding              Breast Clinic Goals - 02/08/15 1321    Patient will be able to verbalize understanding of pertinent lymphedema risk reduction practices relevant to her diagnosis specifically related to skin care.   Time 1   Period Days   Status Achieved   Patient will be able to return demonstrate and/or verbalize understanding of the post-op home exercise program related to regaining shoulder range of motion.   Time 1   Period Days   Status Achieved   Patient will be able to verbalize understanding of the importance of attending the postoperative After Breast Cancer Class for further lymphedema risk reduction education and therapeutic exercise.   Time 1   Period Days   Status Achieved          Long Term Clinic Goals - 11/09/15 1149    CC Long Term Goal  #1   Title Patient will verbalize understanding of where and how to be fitted for a compression sleeve and glove.   Time 4   Period Weeks   Status New   CC Long Term Goal  #2   Title Patient will increase right shoulder flexion to >/= 150 degrees for increased ease reaching and improved lymphatic circulation.   Baseline 126 degrees   Time 4   Period Weeks   Status New   CC  Long Term Goal  #3   Title Patient will increase right shoulder abduction to >/= 160 degrees for increased ease reaching and improved lymphatic circulation.   Baseline 136 degrees   Time 4   Period Weeks   Status New   CC Long Term Goal  #4   Title Reduce right arm circumference by >/= 1 cm at 10 cm proximal to ulnar styloid process.   Time 4   Period Weeks   Status New   CC Long Term Goal  #5   Title Reduce right arm circumference by >/= 1 cm at 10 cm proximal to olecranon.   Time 4   Period Weeks   Status New   CC Long Term Goal  #6   Title Verbalize understanding of risk reduction practices related to lymphedema.   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  Neoadjuvant chemotherapy ended 07/10/15 followed by a right mastectomy with axillary node dissection (13 nodes removed with 3 positive) on 07/28/15 followed by radiation which ended 10/13/15.  She also has a left kidney tumor that she needs to have removed and is seeing a urologist next week about that but it does not appear to be related to her breast cancer and the patient believes it to be benign but is concerning due to recent growth of the mass.  She will benefit from PT to reduce right arm lymphedema symptoms and instruct her with proper management and also address right shoulder ROM limitations.  She was seen at time of diagnosis for a baseline assessment and right arm  measurements when compared to the left arm and compared to baseline measurements indicate right arm lymphedema.  She has gained 30 pounds since her diagnosis so bilateral UEs have increased but the right arm has increased more significantly than her left.   Pt will benefit from skilled therapeutic intervention in order to improve on the following deficits Decreased scar  mobility;Decreased range of motion;Postural dysfunction;Increased edema   Rehab Potential Excellent   Clinical Impairments Affecting Rehab Potential none   PT Frequency 2x / week   PT Duration 4 weeks   PT Treatment/Interventions ADLs/Self Care Home Management;Passive range of motion;Patient/family education;Manual lymph drainage;Manual techniques   PT Next Visit Plan Begin manual lymph drainage and f/u with pt on getting her sleeve and glove; she was given Hoyle Sauer Alexander's name and number and said she would contact her.   PT Home Exercise Plan Shouledr ROM   Consulted and Agree with Plan of Care Patient         Problem List Patient Active Problem List   Diagnosis Date Noted  . Chemotherapy-induced peripheral neuropathy ([place]) 08/04/2015  . Genetic testing 03/07/2015  . Left renal mass 02/17/2015  . Breast cancer of upper-outer quadrant of right female breast ([place]) 01/27/2015    Annia Friendly, PT 11/09/2015 11:56 AM  [place], [place], [address] Phone: [phone]   Fax:  [phone]  Name: EVANNA HINDMAN MRN: [id number] Date of Birth: [birth year]

## 2015-11-14 ENCOUNTER — Ambulatory Visit: Payer: 59

## 2015-11-14 DIAGNOSIS — M25611 Stiffness of right shoulder, not elsewhere classified: Secondary | ICD-10-CM

## 2015-11-14 DIAGNOSIS — C50411 Malignant neoplasm of upper-outer quadrant of right female breast: Secondary | ICD-10-CM

## 2015-11-14 DIAGNOSIS — C50911 Malignant neoplasm of unspecified site of right female breast: Secondary | ICD-10-CM

## 2015-11-14 DIAGNOSIS — I972 Postmastectomy lymphedema syndrome: Secondary | ICD-10-CM

## 2015-11-14 DIAGNOSIS — R293 Abnormal posture: Secondary | ICD-10-CM

## 2015-11-14 DIAGNOSIS — Z9189 Other specified personal risk factors, not elsewhere classified: Secondary | ICD-10-CM

## 2015-11-14 NOTE — Therapy (Signed)
[place], [place], [address] Phone: [phone]   Fax:  [phone]  Physical Therapy Treatment  Patient Details  Name: Phyllis Pruitt MRN: [id number] Date of Birth: 09/30/1971 Referring Provider: Dr. Lurline Del  Encounter Date: 11/14/2015      PT End of Session - 11/14/15 1157    Visit Number 2   Number of Visits 8   Date for PT Re-Evaluation 12/07/15   PT Start Time 1104   PT Stop Time 1149   PT Time Calculation (min) 45 min   Activity Tolerance Patient tolerated treatment well   Behavior During Therapy [facility] for tasks assessed/performed      Past Medical History  Diagnosis Date  . Breast cancer of upper-outer quadrant of right female breast ([place]) 01/27/2015  . Depression   . Neuropathy ([place])   . Sickle cell trait [facility])     Past Surgical History  Procedure Laterality Date  . Tubal ligation    . Tonsillectomy    . Wisdome teeth extraction    . Portacath placement N/A 02/10/2015    Procedure: INSERTION PORT-A-CATH WITH ULTRA SOUND, left subclavian,;  Surgeon: Alphonsa Overall, MD;  Location[facility];  Service: General;  Laterality: N/A;  . Mastectomy modified radical Right 07/28/2015  . Port-a-cath removal  07/28/2015  . Mastectomy modified radical Right 07/28/2015    Procedure: RIGHT MODIFIED RADICAL MASTECTOMY;  Surgeon: Alphonsa Overall, MD;  Location: [place];  Service: General;  Laterality: Right;  . Port-a-cath removal  07/28/2015    Procedure: REMOVAL PORT-A-CATH;  Surgeon: Alphonsa Overall, MD;  Location: [place];  Service: General;;    There were no vitals filed for this visit.  Visit Diagnosis:  Post-mastectomy lymphedema syndrome  Shoulder stiffness, right  Malignant neoplasm of right female breast, unspecified site of breast ([place])  At risk for lymphedema  Carcinoma of upper-outer quadrant of right female breast ([place])  Abnormal posture      Subjective Assessment - 11/14/15 1107     Subjective I tried calling Hoyle Sauer a few times and have leftmessages at her office and haven't heard back from her. The tenderness in my hand and forearm is still there,mainly when I stretch I can feel it like it's knotting up.    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  Neoadjuvant chemotherapy ended 07/10/15 followed by a right mastectomy with axillary node dissection (13 nodes removed with 3 positive) on 07/28/15 followed by radiation which ended 10/13/15.  She also has a left kidney tumor that she needs to have removed.   Patient Stated Goals Prevent worsening of lymphedema   Currently in Pain? Yes   Pain Score 6    Pain Location Other (Comment)  Forearm   Pain Orientation Right   Pain Descriptors / Indicators Other (Comment);Tingling  Knotted   Pain Onset More than a month ago   Pain Frequency Intermittent   Aggravating Factors  end ROM is when I really notice it   Pain Relieving Factors not stretching so far                         [facility] Adult PT Treatment/Exercise - 11/14/15 0001    Manual Therapy   Myofascial Release To Rt antecubital fossa where cording is palpable   Manual Lymphatic Drainage (MLD) In Supine: Short neck, superficial and deep abdominals, Rt inguinal nodes, Rt axillo-inguinal anastomosis, Lt axillary nodes and anterior inter-axiallary anastomosis,  and then Rt upper arm, forearm, and dorsal hand then retracing steps along anastomosis beginning to instruct pt in principles.    Passive ROM To Rt shoulder into flexion, abduction, D2 to pts tolerance.   Neural Stretch To Rt UE and instructed pt in this using wall                PT Education - 11/14/15 1205    Education provided Yes   Education Details Neural tension stretch on wall for Rt UE; began instruction in manual lymph drainage principles and technique   Person(s) Educated Patient   Methods Explanation;Demonstration   Comprehension Verbalized  understanding;Need further instruction;Returned Conservator, museum/gallery              Breast Clinic Goals - 02/08/15 1321    Patient will be able to verbalize understanding of pertinent lymphedema risk reduction practices relevant to her diagnosis specifically related to skin care.   Time 1   Period Days   Status Achieved   Patient will be able to return demonstrate and/or verbalize understanding of the post-op home exercise program related to regaining shoulder range of motion.   Time 1   Period Days   Status Achieved   Patient will be able to verbalize understanding of the importance of attending the postoperative After Breast Cancer Class for further lymphedema risk reduction education and therapeutic exercise.   Time 1   Period Days   Status Achieved          Long Term Clinic Goals - 11/09/15 1149    CC Long Term Goal  #1   Title Patient will verbalize understanding of where and how to be fitted for a compression sleeve and glove.   Time 4   Period Weeks   Status New   CC Long Term Goal  #2   Title Patient will increase right shoulder flexion to >/= 150 degrees for increased ease reaching and improved lymphatic circulation.   Baseline 126 degrees   Time 4   Period Weeks   Status New   CC Long Term Goal  #3   Title Patient will increase right shoulder abduction to >/= 160 degrees for increased ease reaching and improved lymphatic circulation.   Baseline 136 degrees   Time 4   Period Weeks   Status New   CC Long Term Goal  #4   Title Reduce right arm circumference by >/= 1 cm at 10 cm proximal to ulnar styloid process.   Time 4   Period Weeks   Status New   CC Long Term Goal  #5   Title Reduce right arm circumference by >/= 1 cm at 10 cm proximal to olecranon.   Time 4   Period Weeks   Status New   CC Long Term Goal  #6   Title Verbalize understanding of risk reduction practices related to lymphedema.   Time 4   Period Weeks    Status New   Additional Goals   Additional Goals Yes            Plan - 11/14/15 1158    Clinical Impression Statement Pt tolerated initial treatment of manual lymph drainage and PROM to Rt UE very well today reporting feeling looser after session. Pt verbalized good understanding of initial instruction of principles of manual lymph drainage and asked appropriate questions.  Pt has not heard back from Kerman Passey so emailed her today requesting f/u with pt.  Pt will benefit from skilled therapeutic intervention in order to improve on the following deficits Decreased scar mobility;Decreased range of motion;Postural dysfunction;Increased edema   Rehab Potential Excellent   Clinical Impairments Affecting Rehab Potential none   PT Frequency 2x / week   PT Duration 4 weeks   PT Treatment/Interventions ADLs/Self Care Home Management;Passive range of motion;Patient/family education;Manual lymph drainage;Manual techniques   PT Next Visit Plan Cont manual lymph drainage and instruction and issue handouts. See if pt has heard from Rexford Maus yet.    PT Home Exercise Plan Cont Shoulder ROM   Consulted and Agree with Plan of Care Patient        Problem List Patient Active Problem List   Diagnosis Date Noted  . Chemotherapy-induced peripheral neuropathy ([place]) 08/04/2015  . Genetic testing 03/07/2015  . Left renal mass 02/17/2015  . Breast cancer of upper-outer quadrant of right female breast ([place]) 01/27/2015    Otelia Limes, PTA 11/14/2015, 12:07 PM  [place], [place], [address] Phone: [phone]   Fax:  [phone]  Name: Phyllis Pruitt MRN: [id number] Date of Birth: Nov 04, 1970

## 2015-11-16 ENCOUNTER — Ambulatory Visit
Admission: RE | Admit: 2015-11-16 | Discharge: 2015-11-16 | Disposition: A | Discharge status: Home / Self Care | Payer: 59 | Source: Ambulatory Visit | Attending: Radiation Oncology | Admitting: Radiation Oncology

## 2015-11-16 ENCOUNTER — Encounter: Payer: Self-pay | Admitting: Nurse Practitioner

## 2015-11-16 ENCOUNTER — Ambulatory Visit: Payer: 59 | Attending: Hematology and Oncology

## 2015-11-16 ENCOUNTER — Ambulatory Visit (HOSPITAL_BASED_OUTPATIENT_CLINIC_OR_DEPARTMENT_OTHER): Payer: 59 | Admitting: Nurse Practitioner

## 2015-11-16 ENCOUNTER — Encounter: Payer: Self-pay | Admitting: Pharmacist

## 2015-11-16 VITALS — BP 143/85 | HR 72 | Temp 98.3°F | Resp 16 | Ht 65.5 in | Wt 195.8 lb

## 2015-11-16 DIAGNOSIS — M25611 Stiffness of right shoulder, not elsewhere classified: Secondary | ICD-10-CM | POA: Diagnosis present

## 2015-11-16 DIAGNOSIS — C50411 Malignant neoplasm of upper-outer quadrant of right female breast: Secondary | ICD-10-CM | POA: Diagnosis not present

## 2015-11-16 DIAGNOSIS — I972 Postmastectomy lymphedema syndrome: Secondary | ICD-10-CM | POA: Diagnosis present

## 2015-11-16 DIAGNOSIS — C50911 Malignant neoplasm of unspecified site of right female breast: Secondary | ICD-10-CM

## 2015-11-16 DIAGNOSIS — R293 Abnormal posture: Secondary | ICD-10-CM | POA: Diagnosis present

## 2015-11-16 DIAGNOSIS — Z9189 Other specified personal risk factors, not elsewhere classified: Secondary | ICD-10-CM

## 2015-11-16 DIAGNOSIS — G629 Polyneuropathy, unspecified: Secondary | ICD-10-CM

## 2015-11-16 DIAGNOSIS — I890 Lymphedema, not elsewhere classified: Secondary | ICD-10-CM

## 2015-11-16 NOTE — Progress Notes (Signed)
11/16/15: Rx for Xeloda is ready for delivery from [facility]. Pharmacy has been trying to contact patient this week with no luck (they did not leave a message for patient to call back). I provided patient with number for [facility] [phone]) and she will call today to schedule delivery for Friday 11/17/15  Thank you,  Montel Clock, PharmD, [facility]

## 2015-11-16 NOTE — Patient Instructions (Signed)
Cancer Rehab [phone] Start with circles above collarbones near neck 5 times. Deep Effective Breath   Standing, sitting, or laying down, place both hands on the belly. Take a deep breath IN, expanding the belly; then breath OUT, contracting the belly. Repeat __5__ times. Do __2-3__ sessions per day and before your self massage.  http://gt2.exer.us/866   Copyright  VHI. All rights reserved.  Axilla to Axilla - Sweep   On uninvolved side make 5 circles in the armpit, then pump _5__ times from involved armpit across chest to uninvolved armpit, making a pathway. Do _1__ time per day.  Copyright  VHI. All rights reserved.  Axilla to Inguinal Nodes - Sweep   On involved side, make 5 circles at groin at panty line, then pump _5__ times from armpit along side of trunk to outer hip, making your other pathway. Do __1_ time per day.  Copyright  VHI. All rights reserved.  Arm Posterior: Elbow to Shoulder - Sweep   Pump _5__ times from back of elbow to top of shoulder. Then inner to outer upper arm _5_ times, then outer arm again _5_ times. Then back to the pathways _2-3_ times. Do _1__ time per day.  Copyright  VHI. All rights reserved.  ARM: [place] Wrist to Elbow - Sweep   Pump or stationary circles _5__ times from wrist to elbow making sure to do both sides of the forearm. Then retrace your steps to the outer arm, and the pathways _2-3_ times each. Do _1__ time per day.  Copyright  VHI. All rights reserved.  ARM: Dorsum of Hand to Shoulder - Sweep   Pump or stationary circles _5__ times on back of hand including knuckle spaces and individual fingers if needed working up towards the wrist, then retrace all your steps working back up the forearm, doing both sides; upper outer arm and back to your pathways _2-3_ times each. Then do 5 circles again at uninvolved armpit and involved groin where you started! Good job!! Do __1_ time per day.  Copyright  VHI. All rights  reserved.

## 2015-11-16 NOTE — Progress Notes (Signed)
   Department of Radiation Oncology  Phone:  [phone] Fax:        [phone]   Name: Phyllis Pruitt MRN: [id number]  DOB: 10-27-1970  Date: 11/16/2015  Follow Up Visit Note  Diagnosis: Breast cancer of upper-outer quadrant of right female breast [facility])   Staging form: Breast, AJCC 7th Edition     Clinical stage from 02/08/2015: Stage IIIA (T3, N1, M0) - Unsigned     Pathologic stage from 07/28/2015: Stage IIA (yT1c, N1a, cM0) - Unsigned  Summary and Interval since last radiation: 1 month 09/03/15-10/13/15  Site/dose:     Right chest wall/50.4 Pearline Cables @ 1.8 Gray per fraction x 28 fractions Right supraclavicular fossa 45 Gy @1 .8 Gy per fraction x 25 fractions  Interval History: Phyllis Pruitt presents today for routine followup. She has a urology appointment today concerning the slight interval enlargement of a lower pole left enhancing renal mass now measuring 2 cm. It is consistent with a renal cell carcinoma. The patient is worried about this mass and would like for it be removed. a tumor of the right kidney. She has noted started the Xeloda because it has not been delivered yet. She has developed lymphedema and is seeing physical therapy for this. She saw survivorship earlier today.  Physical Exam:  There were no vitals filed for this visit.  Vitals with BMI 11/16/2015  Height 5' 5.5"  Weight 195 lbs 13 oz  BMI [id number]  Systolic A999333  Diastolic 85  Pulse 72  Respirations 16  Right chest wall skin is healing well. She has some edema of her right arm and wrist.  IMPRESSION: Phyllis Pruitt is a 45 y.o. female with stage IIIA right breast cancer resolving from the acute effects of radiation.  PLAN: She is doing well. We discussed the need for follow up every 4-6 months which she has scheduled.  We discussed the need for yearly mammograms which she can schedule with her OBGYN or with medical oncology. We discussed the need for sun protection in the treated area.  She can always call me with  questions.  I will follow up with her on an as needed basis.  She is to follow up with physical therapy, Dr. [name], and urology. She spoke to the cancer center pharmacist and the Xeloda should be delivered tomorrow.  Thea Silversmith, MD  This document serves as a record of services personally performed by Thea Silversmith, MD. It was created on her behalf by Darcus Austin, a trained medical scribe. The creation of this record is based on the scribe's personal observations and the provider's statements to them. This document has been checked and approved by the attending provider.

## 2015-11-16 NOTE — Progress Notes (Signed)
CLINIC:  Cancer Survivorship   REASON FOR VISIT:  Routine follow-up post-treatment for a recent history of breast cancer.  BRIEF ONCOLOGIC HISTORY:    Breast cancer of upper-outer quadrant of right female breast ([place])   01/19/2015 Mammogram Right breast: density with indistinct margin at 12:00, posterior depth; also oval mass with obscured margin at 11:00, middle depth; irregular mass with indistinct margin central to nipple, middle depth   01/23/2015 Initial Biopsy Right breast 9:00 and 10:00 biopsy ([facility]): Grade 3 IDC ER/PR negative, HER-2 negative, Ki-67 77% and 73%; the right axillary lymph node biopsy: ER 22% positive   01/30/2015 Breast MRI Right breast masses 9 and 10:00 position, additional irregular masses with non-mass enhancement upper outer quadrant with one mass extending into upper inner quadrant, multifocal, multicentric 13.7 x 4.6 x 4.6 cm, malignant right axillary lymph node   02/08/2015 Clinical Stage Stage IIIA: T3, N1, M0   02/09/2015 Procedure BreastNext panel revealed VUS BRCA2 gene called p.E1250G (c.3749A>G); otherwise no clinically significant variant at ATM, BARD1, BRCA1, BRCA2, BRIP1, CDH1, CHEK2, MRE11A, MUTYH, NBN, NF1, PALB2, PTEN, RAD50, RAD51C, RAD51D, and TP53   02/15/2015 Imaging No evidence of metastatic disease, 2.2 cm enhancing lesion anterior left lower kidney suspicious for solid renal neoplasm   02/20/2015 - 07/10/2015 Neo-Adjuvant Chemotherapy dose dense doxorubicin and cyclophosphamide x 4 followed by Abraxane weekly x 12   07/10/2015 Breast MRI Multiple irregular enhancing masses right breast are less confluent and smaller dominant mass 10:00 2.1 x 1.9 x 1.3 cm; right breast 11:00 mass measures 1.2 x 1.2 x 0.8 cm, decreased right axillary lymph node thickening   07/28/2015 Definitive Surgery Rt mastectomy: Multifocal IDC 2 cm, 1.5 cm. 0.6 cm; 3/13 LN positive, ER/PR HER-2   07/28/2015 Pathologic Stage Stage IIA: T1c N1 M0     09/03/2015 - 10/13/2015 Radiation  Therapy Adjuvant radiation therapy: Right chest wall/50.4 Pearline Cables @ 1.8 Gray per fraction x 28 fractions. Right supraclavicular fossa 45 Gy '@1'$ .8 Gy per fraction x 25 fractions   11/15/2015 -  Chemotherapy Adjuvant capecitabine - planned for 8 cycles    Anti-estrogen oral therapy Plan to begin following completion of capecitabine (ER positive node)    INTERVAL HISTORY:  Phyllis Pruitt presents to the [facility] today for our initial meeting to review her survivorship care plan detailing her treatment course for breast cancer, as well as monitoring long-term side effects of that treatment, education regarding health maintenance, screening, and overall wellness and health promotion.     Overall, Phyllis Pruitt reports feeling well since completing her radiation therapy approximately one month ago.  She denies any fatigue and states that the skin changes overlying her right breast are much improved, denying any open areas.  She continues to work out daily and is active throughout the day.  Her activity is limited towards the end of the day by bilateral hip and knee stiffness, which develops the longer she is on her feet.  She denies any change in her breast.  She has had no headache, cough, or shortness of breath.  She has a good appetite and denies any weight loss.  She states that the peripheral neuropathy in her bilateral hands had been improving, however, has worsened with the onset of the lymphedema she experienced in the last few weeks. The neuropathy in her bilateral feet is about the same.  She is undergoing physical therapy for her lymphedema and has been measured for a compression sleeve, however, has not yet gotten it as she  needed a signature on her prescription. She has not yet begun her capecitabine as she had not heard whether it was to be filled by her local pharmacy [facility]) or one of the speciality pharmacies.  She has a follow up appointment with Dr. [name] today and Dr. [name] in two weeks'  time.  REVIEW OF SYSTEMS:  General: Denies fever, chills, unintentional weight loss, or generalized fatigue.  HEENT: Occasional blurred vision. Denies hearing loss, mouth sores or difficulty swallowing. Cardiac: Denies palpitations, chest pain, and lower extremity edema.  Respiratory: Denies wheeze or dyspnea on exertion.  Breast: Denies any new nodularity, masses, tenderness, nipple changes, or nipple discharge.  GI: Denies abdominal pain, constipation, diarrhea, nausea, or vomiting.  GU: Denies dysuria, hematuria, vaginal bleeding, vaginal discharge, or vaginal dryness.  Musculoskeletal: Pain in her bilateral hip and knee joints along with right arm swelling as above.   Neuro: Peripheral neuropathy as above. Denies recent fall. Skin: Denies rash, pruritis, or open wounds.  Psych: Denies depression, anxiety, insomnia, or memory loss.   A 14-point review of systems was completed and was negative, except as noted above.   ONCOLOGY TREATMENT TEAM:  1. Surgeon:  Dr. Lucia Gaskins at [facility]  2. Medical Oncologist: Dr. [name] 3. Radiation Oncologist: Dr. [name]    PAST MEDICAL/SURGICAL HISTORY:  Past Medical History  Diagnosis Date  . Breast cancer of upper-outer quadrant of right female breast ([place]) 01/27/2015  . Depression   . Neuropathy ([place])   . Sickle cell trait [facility])    Past Surgical History  Procedure Laterality Date  . Tubal ligation    . Tonsillectomy    . Wisdome teeth extraction    . Portacath placement N/A 02/10/2015    Procedure: INSERTION PORT-A-CATH WITH ULTRA SOUND, left subclavian,;  Surgeon: Alphonsa Overall, MD;  Location: [facility];  Service: General;  Laterality: N/A;  . Mastectomy modified radical Right 07/28/2015  . Port-a-cath removal  07/28/2015  . Mastectomy modified radical Right 07/28/2015    Procedure: RIGHT MODIFIED RADICAL MASTECTOMY;  Surgeon: Alphonsa Overall, MD;  Location: [place];  Service: General;  Laterality: Right;  . Port-a-cath removal   07/28/2015    Procedure: REMOVAL PORT-A-CATH;  Surgeon: Alphonsa Overall, MD;  Location: [place];  Service: General;;     ALLERGIES:  No Known Allergies   CURRENT MEDICATIONS:  Current Outpatient Prescriptions on File Prior to Visit  Medication Sig Dispense Refill  . Diphenhydramine-APAP, sleep, (TYLENOL PM EXTRA STRENGTH PO) Take by mouth.    . Ginger, Zingiber officinalis, (GINGER ROOT PO) Take 1 tablet by mouth daily.     Marland Kitchen HYDROcodone-acetaminophen (NORCO/VICODIN) 5-325 MG tablet Take 1-2 tablets by mouth every 4 (four) hours as needed for moderate pain. 40 tablet 0  . Multiple Vitamin (MULTIVITAMIN WITH MINERALS) TABS tablet Take 1 tablet by mouth daily.    . naproxen sodium (ANAPROX) 220 MG tablet Take 220 mg by mouth 2 (two) times daily with a meal. Reported on 11/09/2015    . Probiotic Product (PROBIOTIC PO) Take 1 tablet by mouth daily.    . TURMERIC CURCUMIN PO Take 1 capsule by mouth daily.     . capecitabine (XELODA) 500 MG tablet Take 4 tablets (2,000 mg total) by mouth 2 (two) times daily after a meal. (Patient not taking: Reported on 11/09/2015) 112 tablet 7   No current facility-administered medications on file prior to visit.     ONCOLOGIC FAMILY HISTORY:  No family history on file.   GENETIC COUNSELING/TESTING: Yes,  performed 02/09/2015: BreastNext panel revealed variant of unknown significance at BRCA2 gene called p.E1250G (c.3749A>G); otherwise no clinically significant variant at ATM, BARD1, BRCA1, BRCA2, BRIP1, CDH1, CHEK2, MRE11A, MUTYH, NBN, NF1, PALB2, PTEN, RAD50, RAD51C, RAD51D, and TP53   SOCIAL HISTORY:  Phyllis Pruitt is married and lives with her family in [place], [place].  She has 4 children. 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 She denies any current or history of tobacco, alcohol, or illicit drug use.     PHYSICAL EXAMINATION:  Vital Signs: Filed Vitals:   11/16/15  1106  BP: 143/85  Pulse: 72  Temp: 98.3 F (36.8 C)  Resp: 16   ECOG Performance Status: 0  General: Well-nourished, well-appearing female in no acute distress.  She is unaccompanied in clinic today.   HEENT: Head is atraumatic and normocephalic.  Pupils equal and reactive to light and accomodation. Conjunctivae clear without exudate.  Sclerae anicteric. Oral mucosa is pink, moist, and intact without lesions.  Oropharynx is pink without lesions or erythema.  Lymph: Right arm swelling. No cervical, supraclavicular, infraclavicular, or axillary lymphadenopathy noted on palpation.  Cardiovascular: Regular rate and rhythm without murmurs, rubs, or gallops. Respiratory: Clear to auscultation bilaterally. Chest expansion symmetric without accessory muscle use on inspiration or expiration.  GI: Abdomen soft and round. No tenderness to palpation. Bowel sounds normoactive in 4 quadrants.  GU: Deferred.  Neuro: No focal deficits. Steady gait.  Psych: Mood and affect normal and appropriate for situation.  Extremities: No edema, cyanosis, or clubbing.  Skin: Warm and dry. No open lesions noted.   LABORATORY DATA:  None for this visit.  DIAGNOSTIC IMAGING:  None for this visit.     ASSESSMENT AND PLAN:   1. 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 (01/2015), grade 3, ER negative (breast specimen) with biopsy of right axillary lymph node revealing ER positivity at 22%, both PR negative, both HER2/neu negative, S/P neoadjuvant chemotherapy with doxorubicin and cyclophosphamide x 4 cycles followed by weekly nab-paclitaxel x 12 (completed 06/2015 with dose reductions following cycles 9 and 10 due to CIPN) with partial response (pathologic stage downgrading to Stage IIA), now to begin adjuvant capecitabine x 8 cycles due to residual disease at time of surgery with plans for adjuvant endocrine therapy with tamoxifen following completion of capecitabine (ER positive  axillary LN).  Phyllis Pruitt is doing well without clinical symptoms worrisome for disease recurrence. She will follow-up with her medical oncologist,  Dr. [name], on 12/06/2015 with history and physical examination per surveillance protocol and to evaluate tolerance with first cycle of capecitabine. Phyllis Pruitt met briefly with Montel Clock RPh at the beginning of our visit today to discuss the speciality pharmacy ([facility]) arrangements for delivery of her capecitabine.  She will begin it tomorrow upon receipt from [place].  A comprehensive survivorship care plan and treatment summary was reviewed with the patient today detailing her breast cancer diagnosis, treatment course, potential late/long-term effects of treatment, appropriate follow-up care with recommendations for the future, and patient education resources.  A copy of this summary, along with a letter will be sent to the patient's primary care provider via in basket message after today's visit.  Phyllis Pruitt is welcome to return to the Survivorship Clinic in the future, as needed; no follow-up will be scheduled at this time. She will see her nephrologist later today regarding her kidney mass.  2. Peripheral neuropathy:  Phyllis Pruitt reports that her hands had been improving following completion of the nab-paclitaxel, however, due to the lymphedema she is experiencing of her right arm, her symptoms have worsened slightly on the right.  The CIPN in her feet is about the same. We will continue to monitor this at this time.    3. Lymphedema: Phyllis Pruitt continues with physical therapy for her lymphedema and continues with her lymphatic drainage therapy.  I have signed the prescription for her compression sleeve, which she will obtain following her visit today.  She will continue therapy under the direction of our PTs.  4. Cancer screening:  Due to Phyllis Pruitt's history and her age, she should receive screening for skin cancers, colon cancer (beginning at age 34),  and gynecologic cancers.  The information and recommendations are listed on the patient's comprehensive care plan/treatment summary and were reviewed in detail with the patient.    5. Health maintenance and wellness promotion: Phyllis Pruitt was encouraged to consume 5-7 servings of fruits and vegetables per day. We reviewed the "Nutrition Rainbow" handout, as well as discussed recommendations to maximize nutrition and minimize recurrence, such as increased intake of fruits, vegetables, lean proteins, and minimizing the intake of red meats and processed foods.  She was also encouraged to engage in moderate to vigorous exercise for 30 minutes per day most days of the week. We discussed the [employer] fitness program, which is designed for cancer survivors to help them become more physically fit after cancer treatments.  She was instructed to limit her alcohol consumption and continue to abstain from tobacco use/.  A copy of the "Take Control of Your Health" brochure was given to her reinforcing these recommendations.   6. Support services/counseling: It is not uncommon for this period of the patient's cancer care trajectory to be one of many emotions and stressors.  We discussed an opportunity for her to participate in the next session of [facility] ("Finding Your New Normal") support group series designed for patients after they have completed treatment.  Phyllis Pruitt was encouraged to take advantage of our many other support services programs, support groups, and/or counseling in coping with her new life as a cancer survivor after completing anti-cancer treatment.  She was offered support today through active listening and expressive supportive counseling. She was given information regarding our available services and encouraged to contact me with any questions or for help enrolling in any of our support group/programs.    A total of 45 minutes of face-to-face time was spent with this patient with greater than 50%  of that time in counseling and care-coordination.   Sylvan Cheese, NP  Survivorship Program [facility] [phone]   Note: PRIMARY CARE PROVIDER [place], [place] [phone]

## 2015-11-16 NOTE — Therapy (Signed)
[place], [place], [address] Phone: [phone]   Fax:  [phone]  Physical Therapy Treatment  Patient Details  Name: Phyllis Pruitt MRN: [id number] Date of Birth: [birth year] Referring Provider: Dr. Lurline Del  Encounter Date: 11/16/2015      PT End of Session - 11/16/15 1021    Visit Number 3   Number of Visits 8   Date for PT Re-Evaluation 12/07/15   PT Start Time 0936   PT Stop Time 1019   PT Time Calculation (min) 43 min   Activity Tolerance Patient tolerated treatment well   Behavior During Therapy [facility] for tasks assessed/performed      Past Medical History  Diagnosis Date  . Breast cancer of upper-outer quadrant of right female breast ([place]) 01/27/2015  . Depression   . Neuropathy ([place])   . Sickle cell trait [facility])     Past Surgical History  Procedure Laterality Date  . Tubal ligation    . Tonsillectomy    . Wisdome teeth extraction    . Portacath placement N/A 02/10/2015    Procedure: INSERTION PORT-A-CATH WITH ULTRA SOUND, left subclavian,;  Surgeon: Alphonsa Overall, MD;  [facility];  Service: General;  Laterality: N/A;  . Mastectomy modified radical Right 07/28/2015  . Port-a-cath removal  07/28/2015  . Mastectomy modified radical Right 07/28/2015    Procedure: RIGHT MODIFIED RADICAL MASTECTOMY;  Surgeon: Alphonsa Overall, MD;  Location: [place];  Service: General;  Laterality: Right;  . Port-a-cath removal  07/28/2015    Procedure: REMOVAL PORT-A-CATH;  Surgeon: Alphonsa Overall, MD;  Location: [place];  Service: General;;    There were no vitals filed for this visit.  Visit Diagnosis:  Post-mastectomy lymphedema syndrome  Shoulder stiffness, right  Malignant neoplasm of right female breast, unspecified site of breast ([place])  At risk for lymphedema  Abnormal posture      Subjective Assessment - 11/16/15 0939    Subjective Still haven't heard from Rexford Maus so I'm  just going to go to [facility] to get my sleeve and glove.    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  Neoadjuvant chemotherapy ended 07/10/15 followed by a right mastectomy with axillary node dissection (13 nodes removed with 3 positive) on 07/28/15 followed by radiation which ended 10/13/15.  She also has a left kidney tumor that she needs to have removed.   Patient Stated Goals Prevent worsening of lymphedema   Currently in Pain? Yes   Pain Score 5    Pain Location Axilla   Pain Orientation Right   Pain Descriptors / Indicators Tender   Pain Type Surgical pain   Pain Onset More than a month ago   Pain Frequency Intermittent   Aggravating Factors  end ROMstill feels worse   Pain Relieving Factors still not stretching so far   Multiple Pain Sites Yes            [facility] PT Assessment - 11/16/15 0001    AROM   Right Shoulder Flexion 142 Degrees   Right Shoulder ABduction 146 Degrees                     [facility] Adult PT Treatment/Exercise - 11/16/15 0001    Manual Therapy   Myofascial Release To Rt antecubital fossa where cording is palpable   Manual Lymphatic Drainage (MLD) In Supine: Short neck, superficial and deep abdominals, Rt inguinal nodes, Rt axillo-inguinal anastomosis, Lt  axillary nodes and anterior inter-axiallary anastomosis, and then Rt upper arm, forearm, and dorsal hand then retracing steps along anastomosis beginning to instruct pt in principles.    Passive ROM To Rt shoulder into flexion, abduction, D2 to pts tolerance.   Neural Stretch To Rt UE and instructed pt in this using wall                PT Education - 11/16/15 1026    Education provided Yes   Education Details Self manual lymph drainage   Person(s) Educated Patient   Methods Explanation;Demonstration   Comprehension Verbalized understanding;Need further instruction              Long Term Clinic Goals - 11/16/15 1020    CC Long Term  Goal  #1   Title Patient will verbalize understanding of where and how to be fitted for a compression sleeve and glove.  Going to go to [facility]   Status On-going   CC Long Term Goal  #2   Title Patient will increase right shoulder flexion to >/= 150 degrees for increased ease reaching and improved lymphatic circulation.  142 degrees attained today 11/16/15   Status On-going   CC Long Term Goal  #3   Title Patient will increase right shoulder abduction to >/= 160 degrees for increased ease reaching and improved lymphatic circulation.  146 degrees attained today 11/16/15   Status On-going   CC Long Term Goal  #4   Title Reduce right arm circumference by >/= 1 cm at 10 cm proximal to ulnar styloid process.   Status On-going   CC Long Term Goal  #5   Title Reduce right arm circumference by >/= 1 cm at 10 cm proximal to olecranon.   Status On-going   CC Long Term Goal  #6   Title Verbalize understanding of risk reduction practices related to lymphedema.   Status On-going            Plan - 11/16/15 1022    Clinical Impression Statement Pt did well with treatment today and her PROM was less tight with stretching and pt reported this feeling better today as well. No complaints of pain with stretching today either. Her AROM has already improved lfrom last week and she is doing well with her progress.    Pt will benefit from skilled therapeutic intervention in order to improve on the following deficits Decreased scar mobility;Decreased range of motion;Postural dysfunction;Increased edema   Rehab Potential Excellent   Clinical Impairments Affecting Rehab Potential none   PT Frequency 2x / week   PT Duration 4 weeks   PT Treatment/Interventions ADLs/Self Care Home Management;Passive range of motion;Patient/family education;Manual lymph drainage;Manual techniques   PT Next Visit Plan Cont manual lymph drainage and instruct husband when he can come. Cont PROM as well.   Consulted and Agree  with Plan of Care Patient        Problem List Patient Active Problem List   Diagnosis Date Noted  . Chemotherapy-induced peripheral neuropathy ([place]) 08/04/2015  . Genetic testing 03/07/2015  . Left renal mass 02/17/2015  . Breast cancer of upper-outer quadrant of right female breast ([place]) 01/27/2015    Otelia Limes, PTA 11/16/2015, 10:30 AM  [place], [place], [address] Phone: [phone]   Fax:  [phone]  Name: Phyllis Pruitt MRN: [id number] Date of Birth: October 15, 1970

## 2015-11-21 ENCOUNTER — Ambulatory Visit: Payer: 59 | Admitting: Physical Therapy

## 2015-11-21 DIAGNOSIS — I972 Postmastectomy lymphedema syndrome: Secondary | ICD-10-CM

## 2015-11-21 NOTE — Therapy (Signed)
[place], [place], [address] Phone: [phone]   Fax:  [phone]  Physical Therapy Treatment  Patient Details  Name: Phyllis Pruitt MRN: [id number] Date of Birth: April 23, 1971 Referring Provider: Dr. Lurline Del  Encounter Date: 11/21/2015      PT End of Session - 11/21/15 1742    Visit Number 4   Number of Visits 8   Date for PT Re-Evaluation 12/07/15   PT Start Time [id number]   PT Stop Time 1524   PT Time Calculation (min) 53 min   Activity Tolerance Patient tolerated treatment well   Behavior During Therapy [facility] for tasks assessed/performed      Past Medical History  Diagnosis Date  . Breast cancer of upper-outer quadrant of right female breast ([place]) 01/27/2015  . Depression   . Neuropathy ([place])   . Sickle cell trait [facility])     Past Surgical History  Procedure Laterality Date  . Tubal ligation    . Tonsillectomy    . Wisdome teeth extraction    . Portacath placement N/A 02/10/2015    Procedure: INSERTION PORT-A-CATH WITH ULTRA SOUND, left subclavian,;  Surgeon: Alphonsa Overall, MD;  [facility];  Service: General;  Laterality: N/A;  . Mastectomy modified radical Right 07/28/2015  . Port-a-cath removal  07/28/2015  . Mastectomy modified radical Right 07/28/2015    Procedure: RIGHT MODIFIED RADICAL MASTECTOMY;  Surgeon: Alphonsa Overall, MD;  Location: [place];  Service: General;  Laterality: Right;  . Port-a-cath removal  07/28/2015    Procedure: REMOVAL PORT-A-CATH;  Surgeon: Alphonsa Overall, MD;  Location: [place];  Service: General;;    There were no vitals filed for this visit.  Visit Diagnosis:  Post-mastectomy lymphedema syndrome      Subjective Assessment - 11/21/15 1435    Subjective "It's not fixing my arm.  I'm stretching, so from that aspect it's better.  My arm is still heavy."   Currently in Pain? Yes   Pain Score 5    Pain Location Arm   Pain Orientation Right   Pain  Descriptors / Indicators Aching   Aggravating Factors  stretching   Pain Relieving Factors stretching               LYMPHEDEMA/ONCOLOGY QUESTIONNAIRE - 11/21/15 1447    Right Upper Extremity Lymphedema   10 cm Proximal to Olecranon Process 32.1 cm   Olecranon Process 28.3 cm   10 cm Proximal to Ulnar Styloid Process 23.6 cm   Just Proximal to Ulnar Styloid Process 18 cm   Across Hand at [employer] 21 cm   At [place] of 2nd Digit 6.4 cm                  [facility] Adult PT Treatment/Exercise - 11/21/15 0001    Self-Care   Self-Care Other Self-Care Comments   Other Self-Care Comments  Cut a piece of TG soft small and tried it on patient's arm.  She reported this felt good and when it was removed some minutes later, light indentation could be seen in her upper arm from the ribbing in this, so it did appear to give mild compression.  Two sample compression sleeves were tried on the patient's arm (samples available in clinic), one CCL1 and one CCL2; the compression class I garment from [employer] fit her better and was more comfortable, so this was sent home with her.   Manual Therapy   Manual Therapy Edema management  Edema Management right UE circumference measurements takne   Manual Lymphatic Drainage (MLD) In Supine: Short neck, superficial and deep abdominals, Rt inguinal nodes, Rt axillo-inguinal anastomosis, Lt axillary nodes and anterior inter-axillary anastomosis, and then Rt upper arm, forearm, and reviewing principles with her.                PT Education - 11/21/15 1741    Education provided Yes   Education Details Wear [employer] garment during the day, but remove if hand and fingers swell; wear TG soft sleeve at night.     Person(s) Educated Patient   Methods Explanation   Comprehension Verbalized understanding              Breast Clinic Goals - 02/08/15 1321    Patient will be able to verbalize understanding of pertinent lymphedema risk  reduction practices relevant to her diagnosis specifically related to skin care.   Time 1   Period Days   Status Achieved   Patient will be able to return demonstrate and/or verbalize understanding of the post-op home exercise program related to regaining shoulder range of motion.   Time 1   Period Days   Status Achieved   Patient will be able to verbalize understanding of the importance of attending the postoperative After Breast Cancer Class for further lymphedema risk reduction education and therapeutic exercise.   Time 1   Period Days   Status Achieved          Long Term Clinic Goals - 11/16/15 1020    CC Long Term Goal  #1   Title Patient will verbalize understanding of where and how to be fitted for a compression sleeve and glove.  Going to go to [facility]   Status On-going   CC Long Term Goal  #2   Title Patient will increase right shoulder flexion to >/= 150 degrees for increased ease reaching and improved lymphatic circulation.  142 degrees attained today 11/16/15   Status On-going   CC Long Term Goal  #3   Title Patient will increase right shoulder abduction to >/= 160 degrees for increased ease reaching and improved lymphatic circulation.  146 degrees attained today 11/16/15   Status On-going   CC Long Term Goal  #4   Title Reduce right arm circumference by >/= 1 cm at 10 cm proximal to ulnar styloid process.   Status On-going   CC Long Term Goal  #5   Title Reduce right arm circumference by >/= 1 cm at 10 cm proximal to olecranon.   Status On-going   CC Long Term Goal  #6   Title Verbalize understanding of risk reduction practices related to lymphedema.   Status On-going            Plan - 11/21/15 1743    Clinical Impression Statement Patient came in expressing frustration today with her arm continuing to be firm and swollen, and not having success at obtaining a compression sleeve and glove.  She had gone to [facility] but they do not file her  insurance so was given a form to fill out about that; she had not heard from [facility] yet.  (They were called and a message left; patient was told the delay may be due to their checking her insurance coverage, which can be slow.)  I was able to fit her in a sample garment today (one received at a meeting) and she seemed pleased with that.  She was going to look  into getting a gauntlet to use with it, which she could pay for out of pocket.  Circumference measurements taken at beginning of session did show some increases in the hand and forearm.   Pt will benefit from skilled therapeutic intervention in order to improve on the following deficits Decreased scar mobility;Decreased range of motion;Postural dysfunction;Increased edema   Rehab Potential Excellent   Clinical Impairments Affecting Rehab Potential none   PT Frequency 2x / week   PT Duration 4 weeks   PT Treatment/Interventions ADLs/Self Care Home Management;Patient/family education;Manual lymph drainage;Orthotic Fit/Training   PT Next Visit Plan Check how she did with compression garments.  If she did not get a gauntlet, consider bandaging fingers and/or hand and teaching patient to do this, to be done along with wearing sleeve.  Cont manual lymph drainage and instruct husband when he can come. Cont PROM as well.   Consulted and Agree with Plan of Care Patient        Problem List Patient Active Problem List   Diagnosis Date Noted  . Chemotherapy-induced peripheral neuropathy ([place]) 08/04/2015  . Genetic testing 03/07/2015  . Left renal mass 02/17/2015  . Breast cancer of upper-outer quadrant of right female breast ([place]) 01/27/2015    Phyllis Pruitt 11/21/2015, 5:51 PM  [place], [place], [address] Phone: [phone]   Fax:  [phone]  Name: Phyllis Pruitt MRN: [id number] Date of Birth: August 14, 1971    Serafina Royals, PT 11/21/2015 5:51 PM

## 2015-11-23 ENCOUNTER — Telehealth: Payer: Self-pay | Admitting: Pharmacist

## 2015-11-23 ENCOUNTER — Ambulatory Visit: Payer: 59

## 2015-11-23 DIAGNOSIS — I972 Postmastectomy lymphedema syndrome: Secondary | ICD-10-CM

## 2015-11-23 DIAGNOSIS — R293 Abnormal posture: Secondary | ICD-10-CM

## 2015-11-23 DIAGNOSIS — C50911 Malignant neoplasm of unspecified site of right female breast: Secondary | ICD-10-CM

## 2015-11-23 DIAGNOSIS — M25611 Stiffness of right shoulder, not elsewhere classified: Secondary | ICD-10-CM

## 2015-11-23 DIAGNOSIS — Z9189 Other specified personal risk factors, not elsewhere classified: Secondary | ICD-10-CM

## 2015-11-23 NOTE — Telephone Encounter (Signed)
11/23/15: Attempted to reach patient for follow up on oral medication: Xeloda. No answer. Left VM for patient to call back with any questions or issues.   Thank you,  Montel Clock, PharmD, [facility] [phone]

## 2015-11-23 NOTE — Therapy (Signed)
[place], [place], [address] Phone: [phone]   Fax:  [phone]  Physical Therapy Treatment  Patient Details  Name: Phyllis Pruitt MRN: [id number] Date of Birth: [birth year] Referring Provider: Dr. Lurline Del  Encounter Date: 11/23/2015      PT End of Session - 11/23/15 0941    Visit Number 5   Number of Visits 8   Date for PT Re-Evaluation 12/07/15   PT Start Time 0937   PT Stop Time 1019   PT Time Calculation (min) 42 min   Activity Tolerance Patient tolerated treatment well   Behavior During Therapy [facility] for tasks assessed/performed      Past Medical History  Diagnosis Date  . Breast cancer of upper-outer quadrant of right female breast ([place]) 01/27/2015  . Depression   . Neuropathy ([place])   . Sickle cell trait [facility])     Past Surgical History  Procedure Laterality Date  . Tubal ligation    . Tonsillectomy    . Wisdome teeth extraction    . Portacath placement N/A 02/10/2015    Procedure: INSERTION PORT-A-CATH WITH ULTRA SOUND, left subclavian,;  Surgeon: Alphonsa Overall, MD;  [facility];  Service: General;  Laterality: N/A;  . Mastectomy modified radical Right 07/28/2015  . Port-a-cath removal  07/28/2015  . Mastectomy modified radical Right 07/28/2015    Procedure: RIGHT MODIFIED RADICAL MASTECTOMY;  Surgeon: Alphonsa Overall, MD;  Location: [place];  Service: General;  Laterality: Right;  . Port-a-cath removal  07/28/2015    Procedure: REMOVAL PORT-A-CATH;  Surgeon: Alphonsa Overall, MD;  Location: [place];  Service: General;;    There were no vitals filed for this visit.  Visit Diagnosis:  Post-mastectomy lymphedema syndrome  Shoulder stiffness, right  Malignant neoplasm of right female breast, unspecified site of breast ([place])  At risk for lymphedema  Abnormal posture      Subjective Assessment - 11/23/15 0943    Subjective The sleeve she gave me last time has really helped,  but I am noticing my hand swell a little with wear of the sleeve. So the thick stockinette at ngiht has helped with my hand.   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  Neoadjuvant chemotherapy ended 07/10/15 followed by a right mastectomy with axillary node dissection (13 nodes removed with 3 positive) on 07/28/15 followed by radiation which ended 10/13/15.  She also has a left kidney tumor that she needs to have removed.   Patient Stated Goals Prevent worsening of lymphedema   Currently in Pain? Yes   Pain Score 5    Pain Location Arm  Forearm   Pain Orientation Right   Pain Descriptors / Indicators Aching   Pain Type Surgical pain   Pain Onset More than a month ago   Pain Frequency Intermittent   Aggravating Factors  stretching   Pain Relieving Factors stretching                         [facility] Adult PT Treatment/Exercise - 11/23/15 0001    Manual Therapy   Manual therapy comments Issued hand piece of size small TG soft for pt to wear with compression sleeve   Manual Lymphatic Drainage (MLD) In Supine: Short neck, superficial and deep abdominals, Rt inguinal nodes, Rt axillo-inguinal anastomosis, Lt axillary nodes and anterior inter-axillary anastomosis, and then Rt upper arm, forearm, and reviewing principles with her.  Breast Clinic Goals - 02/08/15 1321    Patient will be able to verbalize understanding of pertinent lymphedema risk reduction practices relevant to her diagnosis specifically related to skin care.   Time 1   Period Days   Status Achieved   Patient will be able to return demonstrate and/or verbalize understanding of the post-op home exercise program related to regaining shoulder range of motion.   Time 1   Period Days   Status Achieved   Patient will be able to verbalize understanding of the importance of attending the postoperative After Breast Cancer Class for further lymphedema  risk reduction education and therapeutic exercise.   Time 1   Period Days   Status Achieved          Long Term Clinic Goals - 11/23/15 1018    CC Long Term Goal  #1   Title Patient will verbalize understanding of where and how to be fitted for a compression sleeve and glove.   Status Achieved   CC Long Term Goal  #2   Title Patient will increase right shoulder flexion to >/= 150 degrees for increased ease reaching and improved lymphatic circulation.   Status On-going   CC Long Term Goal  #3   Title Patient will increase right shoulder abduction to >/= 160 degrees for increased ease reaching and improved lymphatic circulation.   Status On-going   CC Long Term Goal  #4   Title Reduce right arm circumference by >/= 1 cm at 10 cm proximal to ulnar styloid process.   Status On-going   CC Long Term Goal  #5   Title Reduce right arm circumference by >/= 1 cm at 10 cm proximal to olecranon.   Status On-going   CC Long Term Goal  #6   Title Verbalize understanding of risk reduction practices related to lymphedema.   Status On-going            Plan - 11/23/15 1019    Clinical Impression Statement Pt pleased with sleeve issued last visit but does notice she needs at least a gauntlet and found out it will be $45 from [place] so she is deciding on that but hopeful to hear from [place] soon as well since she will file insurance. Tolerated treatment fine today reporting feeling better after. Reports everything showing improvement at this time except arm pain which she is also hopeful will reduce with consistent treatment.    Pt will benefit from skilled therapeutic intervention in order to improve on the following deficits Decreased scar mobility;Decreased range of motion;Postural dysfunction;Increased edema   Rehab Potential Excellent   Clinical Impairments Affecting Rehab Potential none   PT Frequency 2x / week   PT Duration 4 weeks   PT Treatment/Interventions ADLs/Self Care  Home Management;Patient/family education;Manual lymph drainage;Orthotic Fit/Training   PT Next Visit Plan If she did not get a gauntlet, consider bandaging fingers and/or hand and teaching patient to do this, to be done along with wearing sleeve.  Cont manual lymph drainage and instruct husband when he can come. Cont PROM as well.   Consulted and Agree with Plan of Care Patient        Problem List Patient Active Problem List   Diagnosis Date Noted  . Chemotherapy-induced peripheral neuropathy ([place]) 08/04/2015  . Genetic testing 03/07/2015  . Left renal mass 02/17/2015  . Breast cancer of upper-outer quadrant of right female breast ([place]) 01/27/2015    Otelia Limes, PTA 11/23/2015, 10:23 AM  [facility]  [place], [place], [address] Phone: [phone]   Fax:  [phone]  Name: SUTTON BOSSARD MRN: [id number] Date of Birth: 02-Jul-1971

## 2015-11-30 ENCOUNTER — Ambulatory Visit: Payer: 59

## 2015-11-30 DIAGNOSIS — I972 Postmastectomy lymphedema syndrome: Secondary | ICD-10-CM

## 2015-11-30 DIAGNOSIS — R293 Abnormal posture: Secondary | ICD-10-CM

## 2015-11-30 DIAGNOSIS — Z9189 Other specified personal risk factors, not elsewhere classified: Secondary | ICD-10-CM

## 2015-11-30 DIAGNOSIS — C50911 Malignant neoplasm of unspecified site of right female breast: Secondary | ICD-10-CM

## 2015-11-30 NOTE — Therapy (Signed)
[place], [place], [address] Phone: [phone]   Fax:  [phone]  Physical Therapy Treatment  Patient Details  Name: Phyllis Pruitt MRN: [id number] Date of Birth: 02-07-1971 Referring Provider: Dr. Lurline Del  Encounter Date: 11/30/2015      PT End of Session - 11/30/15 1019    Visit Number 6   Number of Visits 8   Date for PT Re-Evaluation 12/07/15   PT Start Time 0934   PT Stop Time 1018   PT Time Calculation (min) 44 min   Activity Tolerance Patient tolerated treatment well   Behavior During Therapy [facility] for tasks assessed/performed      Past Medical History  Diagnosis Date  . Breast cancer of upper-outer quadrant of right female breast ([place]) 01/27/2015  . Depression   . Neuropathy ([place])   . Sickle cell trait [facility])     Past Surgical History  Procedure Laterality Date  . Tubal ligation    . Tonsillectomy    . Wisdome teeth extraction    . Portacath placement N/A 02/10/2015    Procedure: INSERTION PORT-A-CATH WITH ULTRA SOUND, left subclavian,;  Surgeon: Alphonsa Overall, MD;  [facility];  Service: General;  Laterality: N/A;  . Mastectomy modified radical Right 07/28/2015  . Port-a-cath removal  07/28/2015  . Mastectomy modified radical Right 07/28/2015    Procedure: RIGHT MODIFIED RADICAL MASTECTOMY;  Surgeon: Alphonsa Overall, MD;  Location: [place];  Service: General;  Laterality: Right;  . Port-a-cath removal  07/28/2015    Procedure: REMOVAL PORT-A-CATH;  Surgeon: Alphonsa Overall, MD;  Location: [place];  Service: General;;    There were no vitals filed for this visit.  Visit Diagnosis:  Post-mastectomy lymphedema syndrome  Malignant neoplasm of right female breast, unspecified site of breast ([place])  At risk for lymphedema  Abnormal posture      Subjective Assessment - 11/30/15 0936    Subjective My hand is finally feeling alot better. It swelled up alot on the way to the  beach though but I did alot to get ready for our trip so that was why. I have a really bad crick in my neck today on the Rt side.   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  Neoadjuvant chemotherapy ended 07/10/15 followed by a right mastectomy with axillary node dissection (13 nodes removed with 3 positive) on 07/28/15 followed by radiation which ended 10/13/15.  She also has a left kidney tumor that she needs to have removed.   Patient Stated Goals Prevent worsening of lymphedema   Currently in Pain? Yes   Pain Score 7    Pain Location Neck   Pain Orientation Right   Pain Descriptors / Indicators Sharp;Tightness   Pain Type Acute pain   Pain Onset Today   Aggravating Factors  slept on it wrong   Pain Relieving Factors just hurts right now                         [facility] Adult PT Treatment/Exercise - 11/30/15 0001    Manual Therapy   Manual Lymphatic Drainage (MLD) In Supine: Short neck, superficial and deep abdominals, Rt inguinal nodes, Rt axillo-inguinal anastomosis, Lt axillary nodes and anterior inter-axillary anastomosis, and then Rt upper arm, forearm, and reviewing principles with her. and instructing husband today   Passive ROM Brief PROM to cervical  area into traction to alleviate c/o tight muscles here for manual lymph drainage                PT Education - 11/30/15 1015    Education provided Yes   Education Details Instructed husband in manual lymph drainage today   Person(s) Educated Patient   Methods Explanation;Demonstration;Handout   Comprehension Verbalized understanding;Returned demonstration;Tactile cues required             [place] Clinic Goals - 11/30/15 1054    CC Long Term Goal  #1   Title Patient will verbalize understanding of where and how to be fitted for a compression sleeve and glove.   Status Achieved   CC Long Term Goal  #2    Title Patient will increase right shoulder flexion to >/= 150 degrees for increased ease reaching and improved lymphatic circulation.   Status On-going   CC Long Term Goal  #3   Title Patient will increase right shoulder abduction to >/= 160 degrees for increased ease reaching and improved lymphatic circulation.   Status On-going   CC Long Term Goal  #4   Title Reduce right arm circumference by >/= 1 cm at 10 cm proximal to ulnar styloid process.   Status On-going   CC Long Term Goal  #5   Title Reduce right arm circumference by >/= 1 cm at 10 cm proximal to olecranon.   Status On-going   CC Long Term Goal  #6   Title Verbalize understanding of risk reduction practices related to lymphedema.   Status Achieved            Plan - 11/30/15 1021    Clinical Impression Statement Pts husband present for instruction today in manual lymph drainage and seemed to have a good understanding of this and returned a good demonstration requiring just minor cuing for lighter pressure.     Pt will benefit from skilled therapeutic intervention in order to improve on the following deficits Decreased scar mobility;Decreased range of motion;Postural dysfunction;Increased edema   Rehab Potential Excellent   Clinical Impairments Affecting Rehab Potential none   PT Frequency 2x / week   PT Duration 4 weeks   PT Treatment/Interventions ADLs/Self Care Home Management;Patient/family education;Manual lymph drainage;Orthotic Fit/Training   PT Next Visit Plan Measure ROM and circumference next visit. If she did not get a gauntlet, consider bandaging fingers and/or hand and teaching patient to do this, to be done along with wearing sleeve.  Cont manual lymph drainage and instruct husband when he can come. Cont PROM as well.    PT Home Exercise Plan Cont Shoulder ROM   Consulted and Agree with Plan of Care Patient        Problem List Patient Active Problem List   Diagnosis Date Noted  . Chemotherapy-induced  peripheral neuropathy ([place]) 08/04/2015  . Genetic testing 03/07/2015  . Left renal mass 02/17/2015  . Breast cancer of upper-outer quadrant of right female breast ([place]) 01/27/2015    Otelia Limes, PTA 11/30/2015, 10:58 AM  [place], [place], [address] Phone: [phone]   Fax:  [phone]  Name: BREND CHAGOYA MRN: [id number] Date of Birth: May 20, 1971

## 2015-11-30 NOTE — Patient Instructions (Signed)
Circles above collar bone 5 times Deep Effective Breath   Standing, sitting, or laying down, place both hands on the belly. Take a deep breath IN, expanding the belly; then breath OUT, contracting the belly. Repeat __5__ times. Do __2-3__ sessions per day and before your self massage.  http://gt2.exer.us/866   Copyright  VHI. All rights reserved.  Axilla to Axilla - Sweep   On uninvolved side make 5 circles in the armpit, then pump _5__ times from involved armpit across chest to uninvolved armpit, making a pathway. Do _1__ time per day.  Copyright  VHI. All rights reserved.  Axilla to Inguinal Nodes - Sweep   On involved side, make 5 circles at groin at panty line, then pump _5__ times from armpit along side of trunk to outer hip, making your other pathway. Do __1_ time per day.  Copyright  VHI. All rights reserved.  Arm Posterior: Elbow to Shoulder - Sweep   Pump _5__ times from back of elbow to top of shoulder. Then inner to outer upper arm _5_ times, then outer arm again _5_ times. Then back to the pathways _2-3_ times. Do _1__ time per day.  Copyright  VHI. All rights reserved.  ARM: [place] Wrist to Elbow - Sweep   Pump or stationary circles _5__ times from wrist to elbow making sure to do both sides of the forearm. Then retrace your steps to the outer arm, and the pathways _2-3_ times each. Do _1__ time per day.  Copyright  VHI. All rights reserved.  ARM: Dorsum of Hand to Shoulder - Sweep   Pump or stationary circles _5__ times on back of hand including knuckle spaces and individual fingers if needed working up towards the wrist, then retrace all your steps working back up the forearm, doing both sides; upper outer arm and back to your pathways _2-3_ times each. Then do 5 circles again at uninvolved armpit and involved groin where you started! Good job!! Do __1_ time per day.  Copyright  VHI. All rights reserved.

## 2015-12-05 ENCOUNTER — Telehealth: Payer: Self-pay | Admitting: Pharmacist

## 2015-12-05 NOTE — Assessment & Plan Note (Signed)
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 0.6 cm; 3/13 LN positive, T1c N1 M0 stage IIA, ER/PR HER-2 negative. Adjuvant XRT 09/03/15 to 10/13/15  Treatment Plan: Adjuvant Xeloda X 6 months (8 cycles) I discussed risks and benefits of Xeloda including the risk of diarrhea and hand-foot syndrome. She would benefit from 2000 mg by mouth twice a day. However for the first month instructed her to start at 1500 mg by mouth twice a day. I plan to start Xeloda February 1 week.  Severe peripheral neuropathy: this is markedly better than before. She still has numbness in the fingers and toes. Weight gain: Patient is struggling with this more than anything else. Renal mass: CT abd 11/01/15: Slight interval enlargement of the lower pole enh renal mass measuring 2 cm.  Return to clinic in 3 weeks with third cycle of Xeloda.   

## 2015-12-05 NOTE — Telephone Encounter (Signed)
Patient restarted xeloda last week. Dr. [name] would like patient to start back on slightly lower dose of 3 tablets of xeloda by mouth twice daily 2 weeks on and 1 week off. I called patient and left voicemail with these instructions and to call back with any questions or concerns.   Thank you,  Montel Clock, PharmD, [facility]

## 2015-12-06 ENCOUNTER — Telehealth: Payer: Self-pay | Admitting: Hematology and Oncology

## 2015-12-06 ENCOUNTER — Encounter: Payer: Self-pay | Admitting: Hematology and Oncology

## 2015-12-06 ENCOUNTER — Ambulatory Visit (HOSPITAL_BASED_OUTPATIENT_CLINIC_OR_DEPARTMENT_OTHER): Payer: 59 | Admitting: Hematology and Oncology

## 2015-12-06 ENCOUNTER — Ambulatory Visit: Payer: 59 | Admitting: Hematology and Oncology

## 2015-12-06 ENCOUNTER — Other Ambulatory Visit: Payer: 59

## 2015-12-06 ENCOUNTER — Other Ambulatory Visit (HOSPITAL_BASED_OUTPATIENT_CLINIC_OR_DEPARTMENT_OTHER): Payer: 59

## 2015-12-06 VITALS — BP 158/84 | HR 78 | Temp 97.5°F | Resp 18 | Wt 197.0 lb

## 2015-12-06 DIAGNOSIS — R51 Headache: Secondary | ICD-10-CM

## 2015-12-06 DIAGNOSIS — N289 Disorder of kidney and ureter, unspecified: Secondary | ICD-10-CM | POA: Diagnosis not present

## 2015-12-06 DIAGNOSIS — G620 Drug-induced polyneuropathy: Secondary | ICD-10-CM

## 2015-12-06 DIAGNOSIS — R197 Diarrhea, unspecified: Secondary | ICD-10-CM

## 2015-12-06 DIAGNOSIS — G622 Polyneuropathy due to other toxic agents: Secondary | ICD-10-CM

## 2015-12-06 DIAGNOSIS — T451X5A Adverse effect of antineoplastic and immunosuppressive drugs, initial encounter: Secondary | ICD-10-CM

## 2015-12-06 DIAGNOSIS — R1030 Lower abdominal pain, unspecified: Secondary | ICD-10-CM | POA: Diagnosis not present

## 2015-12-06 DIAGNOSIS — N2889 Other specified disorders of kidney and ureter: Secondary | ICD-10-CM

## 2015-12-06 DIAGNOSIS — C50411 Malignant neoplasm of upper-outer quadrant of right female breast: Secondary | ICD-10-CM

## 2015-12-06 LAB — CBC WITH DIFFERENTIAL/PLATELET
BASO%: 0.7 % (ref 0.0–2.0)
BASOS ABS: 0 10*3/uL (ref 0.0–0.1)
EOS%: 1.5 % (ref 0.0–7.0)
Eosinophils Absolute: 0.1 10*3/uL (ref 0.0–0.5)
HEMATOCRIT: 33.3 % — AB (ref 34.8–46.6)
HEMOGLOBIN: 11.1 g/dL — AB (ref 11.6–15.9)
LYMPH#: 0.8 10*3/uL — AB (ref 0.9–3.3)
LYMPH%: 20.1 % (ref 14.0–49.7)
MCH: 25.8 pg (ref 25.1–34.0)
MCHC: 33.3 g/dL (ref 31.5–36.0)
MCV: 77.4 fL — ABNORMAL LOW (ref 79.5–101.0)
MONO#: 0.5 10*3/uL (ref 0.1–0.9)
MONO%: 11.5 % (ref 0.0–14.0)
NEUT#: 2.7 10*3/uL (ref 1.5–6.5)
NEUT%: 66.2 % (ref 38.4–76.8)
PLATELETS: 256 10*3/uL (ref 145–400)
RBC: 4.3 10*6/uL (ref 3.70–5.45)
RDW: 16.3 % — AB (ref 11.2–14.5)
WBC: 4.1 10*3/uL (ref 3.9–10.3)

## 2015-12-06 LAB — COMPREHENSIVE METABOLIC PANEL
ALBUMIN: 3.3 g/dL — AB (ref 3.5–5.0)
ALK PHOS: 80 U/L (ref 40–150)
ALT: 18 U/L (ref 0–55)
ANION GAP: 6 meq/L (ref 3–11)
AST: 18 U/L (ref 5–34)
BUN: 5.2 mg/dL — AB (ref 7.0–26.0)
CALCIUM: 8.8 mg/dL (ref 8.4–10.4)
CO2: 27 mEq/L (ref 22–29)
CREATININE: 0.8 mg/dL (ref 0.6–1.1)
Chloride: 106 mEq/L (ref 98–109)
EGFR: >90 mL/min/{1.73_m2} (ref >90)
Glucose: 103 mg/dl (ref 70–140)
Potassium: 3.8 mEq/L (ref 3.5–5.1)
Sodium: 140 mEq/L (ref 136–145)
TOTAL PROTEIN: 7.1 g/dL (ref 6.4–8.3)

## 2015-12-06 NOTE — Progress Notes (Signed)
Patient Care Team: [name], MD as PCP - General (Family Medicine) Chauncey Cruel, MD as Consulting Physician (Oncology) Thea Silversmith, MD as Consulting Physician (Radiation Oncology) Mauro Kaufmann, RN as Registered Nurse Rockwell Germany, RN as Registered Nurse Alphonsa Overall, MD as Consulting Physician (General Surgery) Sylvan Cheese, NP as Nurse Practitioner (Hematology and Oncology)  DIAGNOSIS: Breast cancer of upper-outer quadrant of right female breast [facility])   Staging form: Breast, AJCC 7th Edition     Clinical stage from 02/08/2015: Stage IIIA (T3, N1, M0) - Unsigned     Pathologic stage from 07/28/2015: Stage IIA (yT1c, N1a, cM0) - Unsigned   SUMMARY OF ONCOLOGIC HISTORY:   Breast cancer of upper-outer quadrant of right female breast ([place])   01/19/2015 Mammogram Right breast: density with indistinct margin at 12:00, posterior depth; also oval mass with obscured margin at 11:00, middle depth; irregular mass with indistinct margin central to nipple, middle depth   01/23/2015 Initial Biopsy Right breast 9:00 and 10:00 biopsy ([facility]): Grade 3 IDC ER/PR negative, HER-2 negative, Ki-67 77% and 73%; the right axillary lymph node biopsy: ER 22% positive   01/30/2015 Breast MRI Right breast masses 9 and 10:00 position, additional irregular masses with non-mass enhancement upper outer quadrant with one mass extending into upper inner quadrant, multifocal, multicentric 13.7 x 4.6 x 4.6 cm, malignant right axillary lymph node   02/08/2015 Clinical Stage Stage IIIA: T3, N1, M0   02/09/2015 Procedure BreastNext panel revealed VUS BRCA2 gene called [id number] ([id number]); otherwise no clinically significant variant at ATM, BARD1, BRCA1, BRCA2, BRIP1, CDH1, CHEK2, MRE11A, MUTYH, NBN, NF1, PALB2, PTEN, RAD50, RAD51C, RAD51D, and TP53   02/15/2015 Imaging No evidence of metastatic disease, 2.2 cm enhancing lesion anterior left lower kidney suspicious for solid renal neoplasm   02/20/2015 -  07/10/2015 Neo-Adjuvant Chemotherapy dose dense doxorubicin and cyclophosphamide x 4 followed by Abraxane weekly x 12   07/10/2015 Breast MRI Multiple irregular enhancing masses right breast are less confluent and smaller dominant mass 10:00 2.1 x 1.9 x 1.3 cm; right breast 11:00 mass measures 1.2 x 1.2 x 0.8 cm, decreased right axillary lymph node thickening   07/28/2015 Definitive Surgery Rt mastectomy: Multifocal IDC 2 cm, 1.5 cm. 0.6 cm; 3/13 LN positive, ER/PR HER-2   07/28/2015 Pathologic Stage Stage IIA: T1c N1 M0     09/03/2015 - 10/13/2015 Radiation Therapy Adjuvant radiation therapy: Right chest wall/50.4 [place] Cables @ 1.8 Gray per fraction x 28 fractions. Right supraclavicular fossa 45 Gy '@1'$ .8 Gy per fraction x 25 fractions   11/15/2015 -  Chemotherapy Adjuvant capecitabine - planned for 8 cycles    Anti-estrogen oral therapy Plan to begin following completion of capecitabine (ER positive node)   11/16/2015 Survivorship Survivorship visit completed and copy of care plan given to patient    CHIEF COMPLIANT: Follow-up on Xeloda   INTERVAL HISTORY: Phyllis Pruitt is a 45 year old with above-mentioned history of right breast cancer treated with neoadjuvant chemotherapy followed by right mastectomy. She still had residual disease so she was started on adjuvant Setting after radiation is complete. She had completed 1 cycle of treatment and had fatigue as most common side effect in addition to diarrhea and abdominal cramps. She does not want to take any medication for the diarrhea. She did not have any nausea or vomiting. She is also not more fatigued than before. Does not complain of hand foot syndrome.  REVIEW OF SYSTEMS:   Constitutional: Denies fevers, chills or abnormal weight loss Eyes: Denies  blurriness of vision Ears, nose, mouth, throat, and face: Denies mucositis or sore throat Respiratory: Denies cough, dyspnea or wheezes Cardiovascular: Denies palpitation, chest discomfort Gastrointestinal:   Diarrhea and abdominal cramps Skin: Denies abnormal skin rashes Lymphatics: Denies new lymphadenopathy or easy bruising Neurological:Denies numbness, tingling or new weaknesses Behavioral/Psych: Mood is stable, no new changes  Extremities: No lower extremity edema  All other systems were reviewed with the patient and are negative.  I have reviewed the past medical history, past surgical history, social history and family history with the patient and they are unchanged from previous note.  ALLERGIES:  has No Known Allergies.  MEDICATIONS:  Current Outpatient Prescriptions  Medication Sig Dispense Refill  . capecitabine (XELODA) 500 MG tablet Take 4 tablets (2,000 mg total) by mouth 2 (two) times daily after a meal. (Patient not taking: Reported on 11/09/2015) 112 tablet 7  . Diphenhydramine-APAP, sleep, (TYLENOL PM EXTRA STRENGTH PO) Take by mouth.    . Ginger, Zingiber officinalis, (GINGER ROOT PO) Take 1 tablet by mouth daily.     Marland Kitchen HYDROcodone-acetaminophen (NORCO/VICODIN) 5-325 MG tablet Take 1-2 tablets by mouth every 4 (four) hours as needed for moderate pain. 40 tablet 0  . Multiple Vitamin (MULTIVITAMIN WITH MINERALS) TABS tablet Take 1 tablet by mouth daily.    . naproxen sodium (ANAPROX) 220 MG tablet Take 220 mg by mouth 2 (two) times daily with a meal. Reported on 11/09/2015    . Probiotic Product (PROBIOTIC PO) Take 1 tablet by mouth daily.    . TURMERIC CURCUMIN PO Take 1 capsule by mouth daily.      No current facility-administered medications for this visit.    PHYSICAL EXAMINATION: ECOG PERFORMANCE STATUS: 1 - Symptomatic but completely ambulatory  Filed Vitals:   12/06/15 1040  BP: 158/84  Pulse: 78  Temp: 97.5 F (36.4 C)  Resp: 18   Filed Weights   12/06/15 1040  Weight: 197 lb (89.359 kg)    GENERAL:alert, no distress and comfortable SKIN: skin color, texture, turgor are normal, no rashes or significant lesions EYES: normal, Conjunctiva are pink and  non-injected, sclera clear OROPHARYNX:no exudate, no erythema and lips, buccal mucosa, and tongue normal  NECK: supple, thyroid normal size, non-tender, without nodularity LYMPH:  no palpable lymphadenopathy in the cervical, axillary or inguinal LUNGS: clear to auscultation and percussion with normal breathing effort HEART: regular rate & rhythm and no murmurs and no lower extremity edema ABDOMEN:abdomen soft, non-tender and normal bowel sounds MUSCULOSKELETAL:no cyanosis of digits and no clubbing  NEURO: alert & oriented x 3 with fluent speech, no focal motor/sensory deficits EXTREMITIES: No lower extremity edema  LABORATORY DATA:  I have reviewed the data as listed   Chemistry      Component Value Date/Time   NA 139 07/26/2015 0850   NA 141 07/12/2015 1019   K 3.4* 07/26/2015 0850   K 3.7 07/12/2015 1019   CL 104 07/26/2015 0850   CO2 30 07/26/2015 0850   CO2 27 07/12/2015 1019   BUN 6 07/26/2015 0850   BUN 8.0 07/12/2015 1019   CREATININE 0.68 07/26/2015 0850   CREATININE 0.7 07/12/2015 1019      Component Value Date/Time   CALCIUM 9.0 07/26/2015 0850   CALCIUM 9.1 07/12/2015 1019   ALKPHOS 68 07/26/2015 0850   ALKPHOS 72 07/12/2015 1019   AST 21 07/26/2015 0850   AST 19 07/12/2015 1019   ALT 23 07/26/2015 0850   ALT 17 07/12/2015 1019   BILITOT 0.5 07/26/2015  0850   BILITOT 0.30 07/12/2015 1019     Lab Results  Component Value Date   WBC 4.1 12/06/2015   HGB 11.1* 12/06/2015   HCT 33.3* 12/06/2015   MCV 77.4* 12/06/2015   PLT 256 12/06/2015   NEUTROABS 2.7 12/06/2015   ASSESSMENT & PLAN:  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 0.6 cm; 3/13 LN positive, T1c N1 M0 stage IIA, ER/PR HER-2 negative. Adjuvant XRT 09/03/15 to 10/13/15  Treatment Plan: Adjuvant Xeloda X 6 months (8 cycles) started January 2017 1500 mg by mouth twice a day Current treatment: Cycle 2 Xeloda started 12/01/2015  Xeloda toxicities: 1. Fatigue 2. Intermittent diarrhea 3. Lower abdominal cramping 4. Intermittent headaches  I reviewed her blood work that adequate for treatment. Patient is on second cycle of treatment.  Severe peripheral neuropathy: this is markedly better than before. She still has numbness in the fingers and toes. Weight gain: Patient is struggling with this more than anything else.  Renal mass: CT abd 11/01/15: Slight interval enlargement of the lower pole enh renal mass measuring 2 cm. patient has an appointment to see urology next week.  Return to clinic in 6 weeks with 4th cycle of Xeloda.  No orders of the defined types were placed in this encounter.   The patient has a good understanding of the overall plan. she agrees with it. she will call with any problems that may develop before the next visit here.   Rulon Eisenmenger, MD 12/06/2015

## 2015-12-06 NOTE — Telephone Encounter (Signed)
appt made and avs printed °

## 2015-12-07 NOTE — Progress Notes (Signed)
Unable to get in to exam room prior to MD.  No assessment performed.  

## 2015-12-08 ENCOUNTER — Other Ambulatory Visit: Payer: Self-pay | Admitting: Urology

## 2015-12-13 DIAGNOSIS — Z85528 Personal history of other malignant neoplasm of kidney: Secondary | ICD-10-CM

## 2015-12-13 HISTORY — DX: Personal history of other malignant neoplasm of kidney: Z85.528

## 2015-12-14 ENCOUNTER — Encounter: Payer: Self-pay | Admitting: Adult Health

## 2015-12-14 NOTE — Progress Notes (Signed)
A birthday card was mailed to the patient today on behalf of the Survivorship Program at [facility].   Skippy Marhefka, NP Survivorship Program [facility] [phone]  

## 2015-12-18 NOTE — Patient Instructions (Signed)
Phyllis Pruitt  12/18/2015   Your procedure is scheduled on: 12/29/2015    Report to [facility] Main  Entrance take [facility]  elevators to 3rd floor to  [place] at     1030 AM.  Call this number if you have problems the morning of surgery [phone]   Remember: ONLY 1 PERSON MAY GO WITH YOU TO SHORT STAY TO GET  READY MORNING OF [place].  Do not eat food or drink liquids :After Midnight.     Take these medicines the morning of surgery with A SIP OF WATER: Hydrocodone if needed                                You may not have any metal on your body including hair pins and              piercings  Do not wear jewelry, make-up, lotions, powders or perfumes, deodorant             Do not wear nail polish.  Do not shave  48 hours prior to surgery.                Do not bring valuables to the hospital. [place].  Contacts, dentures or bridgework may not be worn into surgery.  Leave suitcase in the car. After surgery it may be brought to your room.      Special Instructions:coughing and deep breathing exercises, leg exercises               Please read over the following fact sheets you were given: _____________________________________________________________________             [facility] - Preparing for Surgery Before surgery, you can play an important role.  Because skin is not sterile, your skin needs to be as free of germs as possible.  You can reduce the number of germs on your skin by washing with CHG (chlorahexidine gluconate) soap before surgery.  CHG is an antiseptic cleaner which kills germs and bonds with the skin to continue killing germs even after washing. Please DO NOT use if you have an allergy to CHG or antibacterial soaps.  If your skin becomes reddened/irritated stop using the CHG and inform your nurse when you arrive at Short Stay. Do not shave (including legs and underarms) for at  least 48 hours prior to the first CHG shower.  You may shave your face/neck. Please follow these instructions carefully:  1.  Shower with CHG Soap the night before surgery and the  morning of Surgery.  2.  If you choose to wash your hair, wash your hair first as usual with your  normal  shampoo.  3.  After you shampoo, rinse your hair and body thoroughly to remove the  shampoo.                           4.  Use CHG as you would any other liquid soap.  You can apply chg directly  to the skin and wash                       Gently with  a scrungie or clean washcloth.  5.  Apply the CHG Soap to your body ONLY FROM THE NECK DOWN.   Do not use on face/ open                           Wound or open sores. Avoid contact with eyes, ears mouth and genitals (private parts).                       Wash face,  Genitals (private parts) with your normal soap.             6.  Wash thoroughly, paying special attention to the area where your surgery  will be performed.  7.  Thoroughly rinse your body with warm water from the neck down.  8.  DO NOT shower/wash with your normal soap after using and rinsing off  the CHG Soap.                9.  Pat yourself dry with a clean towel.            10.  Wear clean pajamas.            11.  Place clean sheets on your bed the night of your first shower and do not  sleep with pets. Day of Surgery : Do not apply any lotions/deodorants the morning of surgery.  Please wear clean clothes to the hospital/surgery center.  FAILURE TO FOLLOW THESE INSTRUCTIONS MAY RESULT IN THE CANCELLATION OF YOUR SURGERY PATIENT SIGNATURE_________________________________  NURSE SIGNATURE__________________________________  ________________________________________________________________________  WHAT IS A BLOOD TRANSFUSION? Blood Transfusion Information  A transfusion is the replacement of blood or some of its parts. Blood is made up of multiple cells which provide different functions.  Red  blood cells carry oxygen and are used for blood loss replacement.  White blood cells fight against infection.  Platelets control bleeding.  Plasma helps clot blood.  Other blood products are available for specialized needs, such as hemophilia or other clotting disorders. BEFORE THE TRANSFUSION  Who gives blood for transfusions?   Healthy volunteers who are fully evaluated to make sure their blood is safe. This is blood bank blood. Transfusion therapy is the safest it has ever been in the practice of medicine. Before blood is taken from a donor, a complete history is taken to make sure that person has no history of diseases nor engages in risky social behavior (examples are intravenous drug use or sexual activity with multiple partners). The donor's travel history is screened to minimize risk of transmitting infections, such as malaria. The donated blood is tested for signs of infectious diseases, such as HIV and hepatitis. The blood is then tested to be sure it is compatible with you in order to minimize the chance of a transfusion reaction. If you or a relative donates blood, this is often done in anticipation of surgery and is not appropriate for emergency situations. It takes many days to process the donated blood. RISKS AND COMPLICATIONS Although transfusion therapy is very safe and saves many lives, the main dangers of transfusion include:   Getting an infectious disease.  Developing a transfusion reaction. This is an allergic reaction to something in the blood you were given. Every precaution is taken to prevent this. The decision to have a blood transfusion has been considered carefully by your caregiver before blood is given. Blood is not given unless the benefits outweigh the risks.  AFTER THE TRANSFUSION  Right after receiving a blood transfusion, you will usually feel much better and more energetic. This is especially true if your red blood cells have gotten low (anemic). The  transfusion raises the level of the red blood cells which carry oxygen, and this usually causes an energy increase.  The nurse administering the transfusion will monitor you carefully for complications. HOME CARE INSTRUCTIONS  No special instructions are needed after a transfusion. You may find your energy is better. Speak with your caregiver about any limitations on activity for underlying diseases you may have. SEEK MEDICAL CARE IF:   Your condition is not improving after your transfusion.  You develop redness or irritation at the intravenous (IV) site. SEEK IMMEDIATE MEDICAL CARE IF:  Any of the following symptoms occur over the next 12 hours:  Shaking chills.  You have a temperature by mouth above 102 F (38.9 C), not controlled by medicine.  Chest, back, or muscle pain.  People around you feel you are not acting correctly or are confused.  Shortness of breath or difficulty breathing.  Dizziness and fainting.  You get a rash or develop hives.  You have a decrease in urine output.  Your urine turns a dark color or changes to pink, red, or brown. Any of the following symptoms occur over the next 10 days:  You have a temperature by mouth above 102 F (38.9 C), not controlled by medicine.  Shortness of breath.  Weakness after normal activity.  The white part of the eye turns yellow (jaundice).  You have a decrease in the amount of urine or are urinating less often.  Your urine turns a dark color or changes to pink, red, or brown. Document Released: 09/27/2000 Document Revised: 12/23/2011 Document Reviewed: 05/16/2008 [facility] Patient Information 2014 [place], [place].  _______________________________________________________________________

## 2015-12-20 ENCOUNTER — Encounter: Payer: Self-pay | Admitting: Pharmacist

## 2015-12-20 NOTE — Progress Notes (Signed)
Oral Chemotherapy Follow-Up Form  Original Start date of oral chemotherapy: _2/3/17_   Called patient today to follow up regarding patient's oral chemotherapy medication: _Xeloda_  Pt is doing well today. She is tolerating Xeloda much better at reduced dose of 1500 mg bid (vs 2000 mg bid). She still feels tired but states it is much more manageable compared to previously.  Pt reports __0__ tablets/doses missed in the last week/month.   Pt reports the following side effects: __fatigue____   Will follow up and call patient again in __3 weeks___   Thank you,  Montel Clock, PharmD, [facility]

## 2015-12-21 ENCOUNTER — Inpatient Hospital Stay (HOSPITAL_COMMUNITY)
Admission: RE | Admit: 2015-12-21 | Discharge: 2015-12-21 | Disposition: A | Discharge status: Home / Self Care | Payer: 59 | Source: Ambulatory Visit

## 2015-12-22 NOTE — Patient Instructions (Addendum)
Phyllis Pruitt  12/22/2015   Your procedure is scheduled [id number] 12/29/2015   Report to [facility]  Entrance take [place]  elevators to 3rd floor to  [place] at  1030 AM.  Call this number if you have problems the morning of surgery [phone]   Remember: ONLY 1 PERSON MAY GO WITH YOU TO SHORT STAY TO GET  READY MORNING OF [place].  Do not eat food or drink liquids :After Midnight.               FOLLOW BOWEL PREP INSTRUCTIONS FROM DR. Noland Fordyce OFFICE ALONG WITH A CLEAR LIQUID DIET ALL DAY ON Thursday 12/28/2015!   CLEAR LIQUID DIET   Foods Allowed                                                                     Foods Excluded  Coffee and tea, regular and decaf                             liquids that you cannot  Plain Jell-O in any flavor                                             see through such as: Fruit ices (not with fruit pulp)                                     milk, soups, orange juice  Iced Popsicles                                    All solid food Carbonated beverages, regular and diet                                    Cranberry, grape and apple juices Sports drinks like Gatorade Lightly seasoned clear broth or consume(fat free) Sugar, honey syrup  Sample Menu Breakfast                                Lunch                                     Supper Cranberry juice                    Beef broth                            Chicken broth Jell-O  Grape juice                           Apple juice Coffee or tea                        Jell-O                                      Popsicle                                                Coffee or tea                        Coffee or tea  _____________________________________________________________________     Take these medicines the morning of surgery with A SIP OF WATER: XELODA                                 You may not have any  metal on your body including hair pins and              piercings  Do not wear jewelry, make-up, lotions, powders or perfumes, deodorant             Do not wear nail polish.  Do not shave  48 hours prior to surgery.              Men may shave face and neck.   Do not bring valuables to the hospital. [place].  Contacts, dentures or bridgework may not be worn into surgery.  Leave suitcase in the car. After surgery it may be brought to your room.                  Please read over the following fact sheets you were given: _____________________________________________________________________             [facility] - Preparing for Surgery Before surgery, you can play an important role.  Because skin is not sterile, your skin needs to be as free of germs as possible.  You can reduce the number of germs on your skin by washing with CHG (chlorahexidine gluconate) soap before surgery.  CHG is an antiseptic cleaner which kills germs and bonds with the skin to continue killing germs even after washing. Please DO NOT use if you have an allergy to CHG or antibacterial soaps.  If your skin becomes reddened/irritated stop using the CHG and inform your nurse when you arrive at Short Stay. Do not shave (including legs and underarms) for at least 48 hours prior to the first CHG shower.  You may shave your face/neck. Please follow these instructions carefully:  1.  Shower with CHG Soap the night before surgery and the  morning of Surgery.  2.  If you choose to wash your hair, wash your hair first as usual with your  normal  shampoo.  3.  After you shampoo, rinse your hair and body thoroughly to remove the  shampoo.  4.  Use CHG as you would any other liquid soap.  You can apply chg directly  to the skin and wash                       Gently with a scrungie or clean washcloth.  5.  Apply the CHG Soap to your body ONLY FROM THE NECK  DOWN.   Do not use on face/ open                           Wound or open sores. Avoid contact with eyes, ears mouth and genitals (private parts).                       Wash face,  Genitals (private parts) with your normal soap.             6.  Wash thoroughly, paying special attention to the area where your surgery  will be performed.  7.  Thoroughly rinse your body with warm water from the neck down.  8.  DO NOT shower/wash with your normal soap after using and rinsing off  the CHG Soap.                9.  Pat yourself dry with a clean towel.            10.  Wear clean pajamas.            11.  Place clean sheets on your bed the night of your first shower and do not  sleep with pets. Day of Surgery : Do not apply any lotions/deodorants the morning of surgery.  Please wear clean clothes to the hospital/surgery center.  FAILURE TO FOLLOW THESE INSTRUCTIONS MAY RESULT IN THE CANCELLATION OF YOUR SURGERY PATIENT SIGNATURE_________________________________  NURSE SIGNATURE__________________________________  ________________________________________________________________________   Adam Phenix  An incentive spirometer is a tool that can help keep your lungs clear and active. This tool measures how well you are filling your lungs with each breath. Taking long deep breaths may help reverse or decrease the chance of developing breathing (pulmonary) problems (especially infection) following:  A long period of time when you are unable to move or be active. BEFORE THE PROCEDURE   If the spirometer includes an indicator to show your best effort, your nurse or respiratory therapist will set it to a desired goal.  If possible, sit up straight or lean slightly forward. Try not to slouch.  Hold the incentive spirometer in an upright position. INSTRUCTIONS FOR USE  1. Sit on the edge of your bed if possible, or sit up as far as you can in bed or on a chair. 2. Hold the incentive spirometer in  an upright position. 3. Breathe out normally. 4. Place the mouthpiece in your mouth and seal your lips tightly around it. 5. Breathe in slowly and as deeply as possible, raising the piston or the ball toward the top of the column. 6. Hold your breath for 3-5 seconds or for as long as possible. Allow the piston or ball to fall to the bottom of the column. 7. Remove the mouthpiece from your mouth and breathe out normally. 8. Rest for a few seconds and repeat Steps 1 through 7 at least 10 times every 1-2 hours when you are awake. Take your time and take a few normal breaths between deep breaths. 9. The spirometer may include an indicator to  show your best effort. Use the indicator as a goal to work toward during each repetition. 10. After each set of 10 deep breaths, practice coughing to be sure your lungs are clear. If you have an incision (the cut made at the time of surgery), support your incision when coughing by placing a pillow or rolled up towels firmly against it. Once you are able to get out of bed, walk around indoors and cough well. You may stop using the incentive spirometer when instructed by your caregiver.  RISKS AND COMPLICATIONS  Take your time so you do not get dizzy or light-headed.  If you are in pain, you may need to take or ask for pain medication before doing incentive spirometry. It is harder to take a deep breath if you are having pain. AFTER USE  Rest and breathe slowly and easily.  It can be helpful to keep track of a log of your progress. Your caregiver can provide you with a simple table to help with this. If you are using the spirometer at home, follow these instructions: [place] IF:   You are having difficultly using the spirometer.  You have trouble using the spirometer as often as instructed.  Your pain medication is not giving enough relief while using the spirometer.  You develop fever of 100.5 F (38.1 C) or higher. SEEK IMMEDIATE MEDICAL CARE  IF:   You cough up bloody sputum that had not been present before.  You develop fever of 102 F (38.9 C) or greater.  You develop worsening pain at or near the incision site. MAKE SURE YOU:   Understand these instructions.  Will watch your condition.  Will get help right away if you are not doing well or get worse. Document Released: 02/10/2007 Document Revised: 12/23/2011 Document Reviewed: 04/13/2007 [facility] Patient Information 2014 [place], [place].   ________________________________________________________________________

## 2015-12-25 ENCOUNTER — Encounter (HOSPITAL_COMMUNITY)
Admission: RE | Admit: 2015-12-25 | Discharge: 2015-12-25 | Disposition: A | Discharge status: Home / Self Care | Payer: 59 | Source: Ambulatory Visit | Attending: Urology | Admitting: Urology

## 2015-12-25 ENCOUNTER — Encounter (HOSPITAL_COMMUNITY): Payer: Self-pay

## 2015-12-25 DIAGNOSIS — Z0183 Encounter for blood typing: Secondary | ICD-10-CM | POA: Diagnosis not present

## 2015-12-25 DIAGNOSIS — Z01812 Encounter for preprocedural laboratory examination: Secondary | ICD-10-CM | POA: Insufficient documentation

## 2015-12-25 DIAGNOSIS — N2889 Other specified disorders of kidney and ureter: Secondary | ICD-10-CM | POA: Diagnosis not present

## 2015-12-25 LAB — CBC
HCT: 35.8 % — ABNORMAL LOW (ref 36.0–46.0)
Hemoglobin: 11.7 g/dL — ABNORMAL LOW (ref 12.0–15.0)
MCH: 26.5 pg (ref 26.0–34.0)
MCHC: 32.7 g/dL (ref 30.0–36.0)
MCV: 81.2 fL (ref 78.0–100.0)
PLATELETS: 263 10*3/uL (ref 150–400)
RBC: 4.41 MIL/uL (ref 3.87–5.11)
RDW: 18 % — AB (ref 11.5–15.5)
WBC: 5.2 10*3/uL (ref 4.0–10.5)

## 2015-12-25 LAB — BASIC METABOLIC PANEL
Anion gap: 6 (ref 5–15)
BUN: 9 mg/dL (ref 6–20)
CO2: 28 mmol/L (ref 22–32)
CREATININE: 0.74 mg/dL (ref 0.44–1.00)
Calcium: 9 mg/dL (ref 8.9–10.3)
Chloride: 105 mmol/L (ref 101–111)
GFR calc Af Amer: >60 mL/min (ref >60)
GLUCOSE: 108 mg/dL — AB (ref 65–99)
Potassium: 4.3 mmol/L (ref 3.5–5.1)
SODIUM: 139 mmol/L (ref 135–145)

## 2015-12-25 LAB — HCG, SERUM, QUALITATIVE: Preg, Serum: NEGATIVE

## 2015-12-25 NOTE — Progress Notes (Addendum)
02/14/2015- noted EKG on chart and in EPIC. 02/15/2015-2 D Echo in EPIC

## 2015-12-26 LAB — ABO/RH: ABO/RH(D): A POS

## 2015-12-29 ENCOUNTER — Inpatient Hospital Stay (HOSPITAL_COMMUNITY)
Admission: RE | Admit: 2015-12-29 | Discharge: 2015-12-31 | DRG: 658 | Disposition: A | Discharge status: Home / Self Care | Payer: 59 | Source: Ambulatory Visit | Attending: Urology | Admitting: Urology

## 2015-12-29 ENCOUNTER — Ambulatory Visit: Payer: 59 | Admitting: Gynecologic Oncology

## 2015-12-29 ENCOUNTER — Encounter (HOSPITAL_COMMUNITY): Payer: Self-pay | Admitting: *Deleted

## 2015-12-29 ENCOUNTER — Inpatient Hospital Stay (HOSPITAL_COMMUNITY): Payer: 59 | Admitting: Anesthesiology

## 2015-12-29 ENCOUNTER — Encounter (HOSPITAL_COMMUNITY)
Admission: RE | Disposition: A | Discharge status: Home / Self Care | Payer: Self-pay | Source: Ambulatory Visit | Attending: Urology

## 2015-12-29 DIAGNOSIS — Z853 Personal history of malignant neoplasm of breast: Secondary | ICD-10-CM | POA: Diagnosis not present

## 2015-12-29 DIAGNOSIS — D573 Sickle-cell trait: Secondary | ICD-10-CM | POA: Diagnosis present

## 2015-12-29 DIAGNOSIS — C642 Malignant neoplasm of left kidney, except renal pelvis: Principal | ICD-10-CM | POA: Diagnosis present

## 2015-12-29 DIAGNOSIS — C50411 Malignant neoplasm of upper-outer quadrant of right female breast: Secondary | ICD-10-CM

## 2015-12-29 DIAGNOSIS — F329 Major depressive disorder, single episode, unspecified: Secondary | ICD-10-CM | POA: Diagnosis present

## 2015-12-29 DIAGNOSIS — Z01812 Encounter for preprocedural laboratory examination: Secondary | ICD-10-CM

## 2015-12-29 DIAGNOSIS — Z79899 Other long term (current) drug therapy: Secondary | ICD-10-CM | POA: Diagnosis not present

## 2015-12-29 DIAGNOSIS — N2889 Other specified disorders of kidney and ureter: Secondary | ICD-10-CM | POA: Diagnosis present

## 2015-12-29 HISTORY — PX: ROBOTIC ASSITED PARTIAL NEPHRECTOMY: SHX6087

## 2015-12-29 LAB — TYPE AND SCREEN
ABO/RH(D): A POS
ANTIBODY SCREEN: NEGATIVE

## 2015-12-29 LAB — HEMOGLOBIN AND HEMATOCRIT, BLOOD
HEMATOCRIT: 34.4 % — AB (ref 36.0–46.0)
Hemoglobin: 11.5 g/dL — ABNORMAL LOW (ref 12.0–15.0)

## 2015-12-29 SURGERY — NEPHRECTOMY, PARTIAL, ROBOT-ASSISTED
Anesthesia: General | Laterality: Left

## 2015-12-29 MED ORDER — CEFAZOLIN SODIUM-DEXTROSE 2-3 GM-% IV SOLR
INTRAVENOUS | Status: AC
  Filled 2015-12-29: qty 50

## 2015-12-29 MED ORDER — SUGAMMADEX SODIUM 200 MG/2ML IV SOLN
INTRAVENOUS | Status: AC
  Filled 2015-12-29: qty 2

## 2015-12-29 MED ORDER — LACTATED RINGERS IR SOLN
Status: DC | PRN
  Administered 2015-12-29: 1000 mL

## 2015-12-29 MED ORDER — ACETAMINOPHEN 10 MG/ML IV SOLN
1000.0000 mg | Freq: Once | INTRAVENOUS | Status: AC
  Administered 2015-12-29: 1000 mg via INTRAVENOUS

## 2015-12-29 MED ORDER — HYDROCODONE-ACETAMINOPHEN 5-325 MG PO TABS: 1.0000 | ORAL_TABLET | Freq: Four times a day (QID) | ORAL | Status: DC | PRN

## 2015-12-29 MED ORDER — PHENYLEPHRINE HCL 10 MG/ML IJ SOLN
10.0000 mg | INTRAVENOUS | Status: DC | PRN
  Administered 2015-12-29: 50 ug/min via INTRAVENOUS

## 2015-12-29 MED ORDER — ACETAMINOPHEN 10 MG/ML IV SOLN
INTRAVENOUS | Status: AC
  Filled 2015-12-29: qty 100

## 2015-12-29 MED ORDER — HYDROMORPHONE HCL 1 MG/ML IJ SOLN
0.5000 mg | INTRAMUSCULAR | Status: DC | PRN
  Administered 2015-12-29: 1 mg via INTRAVENOUS
  Filled 2015-12-29: qty 1

## 2015-12-29 MED ORDER — ACETAMINOPHEN 325 MG PO TABS: 650.0000 mg | ORAL_TABLET | ORAL | Status: DC | PRN

## 2015-12-29 MED ORDER — MIDAZOLAM HCL 2 MG/2ML IJ SOLN
INTRAMUSCULAR | Status: AC
  Filled 2015-12-29: qty 2

## 2015-12-29 MED ORDER — PHENYLEPHRINE 40 MCG/ML (10ML) SYRINGE FOR IV PUSH (FOR BLOOD PRESSURE SUPPORT)
PREFILLED_SYRINGE | INTRAVENOUS | Status: AC
  Filled 2015-12-29: qty 10

## 2015-12-29 MED ORDER — DEXAMETHASONE SODIUM PHOSPHATE 10 MG/ML IJ SOLN
INTRAMUSCULAR | Status: DC | PRN
  Administered 2015-12-29: 10 mg via INTRAVENOUS

## 2015-12-29 MED ORDER — PROPOFOL 10 MG/ML IV BOLUS
INTRAVENOUS | Status: DC | PRN
  Administered 2015-12-29: 170 mg via INTRAVENOUS

## 2015-12-29 MED ORDER — FENTANYL CITRATE (PF) 250 MCG/5ML IJ SOLN
INTRAMUSCULAR | Status: AC
  Filled 2015-12-29: qty 5

## 2015-12-29 MED ORDER — HYDROMORPHONE HCL 1 MG/ML IJ SOLN
INTRAMUSCULAR | Status: AC
  Filled 2015-12-29: qty 1

## 2015-12-29 MED ORDER — DIPHENHYDRAMINE HCL 12.5 MG/5ML PO ELIX: 12.5000 mg | ORAL_SOLUTION | Freq: Four times a day (QID) | ORAL | Status: DC | PRN

## 2015-12-29 MED ORDER — BUPIVACAINE LIPOSOME 1.3 % IJ SUSP
20.0000 mL | Freq: Once | INTRAMUSCULAR | Status: AC
  Administered 2015-12-29: 20 mL
  Filled 2015-12-29: qty 20

## 2015-12-29 MED ORDER — LIDOCAINE HCL (CARDIAC) 20 MG/ML IV SOLN
INTRAVENOUS | Status: DC | PRN
  Administered 2015-12-29: 100 mg via INTRAVENOUS

## 2015-12-29 MED ORDER — ROCURONIUM BROMIDE 100 MG/10ML IV SOLN
INTRAVENOUS | Status: DC | PRN
  Administered 2015-12-29: 50 mg via INTRAVENOUS
  Administered 2015-12-29: 10 mg via INTRAVENOUS

## 2015-12-29 MED ORDER — LACTATED RINGERS IV SOLN
INTRAVENOUS | Status: DC | PRN
  Administered 2015-12-29 (×2): via INTRAVENOUS

## 2015-12-29 MED ORDER — SUCCINYLCHOLINE CHLORIDE 20 MG/ML IJ SOLN
INTRAMUSCULAR | Status: DC | PRN
  Administered 2015-12-29: 100 mg via INTRAVENOUS

## 2015-12-29 MED ORDER — CEFAZOLIN SODIUM-DEXTROSE 2-3 GM-% IV SOLR
2.0000 g | INTRAVENOUS | Status: AC
  Administered 2015-12-29: 2 g via INTRAVENOUS

## 2015-12-29 MED ORDER — ONDANSETRON HCL 4 MG/2ML IJ SOLN
INTRAMUSCULAR | Status: DC | PRN
  Administered 2015-12-29: 4 mg via INTRAVENOUS

## 2015-12-29 MED ORDER — PROMETHAZINE HCL 25 MG/ML IJ SOLN: 6.2500 mg | INTRAMUSCULAR | Status: DC | PRN

## 2015-12-29 MED ORDER — CAPECITABINE 500 MG PO TABS: 1500.0000 mg/m2 | ORAL_TABLET | Freq: Two times a day (BID) | ORAL | Status: DC

## 2015-12-29 MED ORDER — HYDROMORPHONE HCL 1 MG/ML IJ SOLN
INTRAMUSCULAR | Status: AC
Start: 2015-12-29 — End: 2015-12-30
  Filled 2015-12-29: qty 1

## 2015-12-29 MED ORDER — PHENYLEPHRINE HCL 10 MG/ML IJ SOLN
INTRAMUSCULAR | Status: DC | PRN
  Administered 2015-12-29: 80 ug via INTRAVENOUS
  Administered 2015-12-29 (×2): 120 ug via INTRAVENOUS
  Administered 2015-12-29: 80 ug via INTRAVENOUS

## 2015-12-29 MED ORDER — ONDANSETRON HCL 4 MG/2ML IJ SOLN: 4.0000 mg | INTRAMUSCULAR | Status: DC | PRN

## 2015-12-29 MED ORDER — HYDROMORPHONE HCL 1 MG/ML IJ SOLN
0.2500 mg | INTRAMUSCULAR | Status: DC | PRN
  Administered 2015-12-29 (×4): 0.5 mg via INTRAVENOUS

## 2015-12-29 MED ORDER — FENTANYL CITRATE (PF) 100 MCG/2ML IJ SOLN
INTRAMUSCULAR | Status: DC | PRN
  Administered 2015-12-29 (×5): 50 ug via INTRAVENOUS

## 2015-12-29 MED ORDER — SUGAMMADEX SODIUM 200 MG/2ML IV SOLN
INTRAVENOUS | Status: DC | PRN
  Administered 2015-12-29: 200 mg via INTRAVENOUS

## 2015-12-29 MED ORDER — STERILE WATER FOR IRRIGATION IR SOLN
Status: DC | PRN
  Administered 2015-12-29: 1000 mL

## 2015-12-29 MED ORDER — MANNITOL 25 % IV SOLN
25.0000 g | INTRAVENOUS | Status: AC
  Filled 2015-12-29: qty 100

## 2015-12-29 MED ORDER — SODIUM CHLORIDE 0.9 % IJ SOLN
INTRAMUSCULAR | Status: AC
  Filled 2015-12-29: qty 20

## 2015-12-29 MED ORDER — MANNITOL 25 % IV SOLN
INTRAVENOUS | Status: DC | PRN
  Administered 2015-12-29 (×2): 12.5 g via INTRAVENOUS

## 2015-12-29 MED ORDER — DEXTROSE-NACL 5-0.45 % IV SOLN
INTRAVENOUS | Status: DC
  Administered 2015-12-29 – 2015-12-30 (×2): via INTRAVENOUS

## 2015-12-29 MED ORDER — MIDAZOLAM HCL 5 MG/5ML IJ SOLN
INTRAMUSCULAR | Status: DC | PRN
  Administered 2015-12-29: 2 mg via INTRAVENOUS

## 2015-12-29 MED ORDER — DIPHENHYDRAMINE HCL 50 MG/ML IJ SOLN: 12.5000 mg | Freq: Four times a day (QID) | INTRAMUSCULAR | Status: DC | PRN

## 2015-12-29 MED ORDER — OXYCODONE-ACETAMINOPHEN 5-325 MG PO TABS
1.0000 | ORAL_TABLET | ORAL | Status: DC | PRN
  Administered 2015-12-30: 1 via ORAL
  Administered 2015-12-30: 2 via ORAL
  Administered 2015-12-31: 1 via ORAL
  Filled 2015-12-29 (×2): qty 1
  Filled 2015-12-29: qty 2

## 2015-12-29 SURGICAL SUPPLY — 66 items
APL ESCP 34 STRL LF DISP (HEMOSTASIS) ×1
APPLICATOR SURGIFLO ENDO (HEMOSTASIS) ×3 IMPLANT
BAG SPEC RTRVL LRG 6X4 10 (ENDOMECHANICALS) ×1
CHLORAPREP W/TINT 26ML (MISCELLANEOUS) ×3 IMPLANT
CLIP LIGATING HEM O LOK PURPLE (MISCELLANEOUS) ×3 IMPLANT
CLIP LIGATING HEMO O LOK GREEN (MISCELLANEOUS) ×6 IMPLANT
CLIP SUT LAPRA TY ABSORB (SUTURE) ×6 IMPLANT
COVER TIP SHEARS 8 DVNC (MISCELLANEOUS) ×1 IMPLANT
COVER TIP SHEARS 8MM DA VINCI (MISCELLANEOUS) ×2
DECANTER SPIKE VIAL GLASS SM (MISCELLANEOUS) ×3 IMPLANT
DRAIN CHANNEL 15F RND FF 3/16 (WOUND CARE) ×3 IMPLANT
DRAPE ARM DVNC X/XI (DISPOSABLE) ×4 IMPLANT
DRAPE COLUMN DVNC XI (DISPOSABLE) ×1 IMPLANT
DRAPE DA VINCI XI ARM (DISPOSABLE) ×8
DRAPE DA VINCI XI COLUMN (DISPOSABLE) ×2
DRAPE INCISE IOBAN 66X45 STRL (DRAPES) ×3 IMPLANT
DRAPE SHEET LG 3/4 BI-LAMINATE (DRAPES) ×3 IMPLANT
ELECT PENCIL ROCKER SW 15FT (MISCELLANEOUS) ×3 IMPLANT
ELECT REM PT RETURN 9FT ADLT (ELECTROSURGICAL) ×3
ELECTRODE REM PT RTRN 9FT ADLT (ELECTROSURGICAL) ×1 IMPLANT
EVACUATOR SILICONE 100CC (DRAIN) ×3 IMPLANT
GLOVE BIO SURGEON STRL SZ 6.5 (GLOVE) ×2 IMPLANT
GLOVE BIO SURGEONS STRL SZ 6.5 (GLOVE) ×1
GLOVE BIOGEL M 8.0 STRL (GLOVE) ×6 IMPLANT
GOWN STRL REUS W/TWL LRG LVL3 (GOWN DISPOSABLE) ×6 IMPLANT
HEMOSTAT SURGICEL 2X14 (HEMOSTASIS) ×2 IMPLANT
HEMOSTAT SURGICEL 4X8 (HEMOSTASIS) ×3 IMPLANT
KIT BASIN OR (CUSTOM PROCEDURE TRAY) ×3 IMPLANT
LIQUID BAND (GAUZE/BANDAGES/DRESSINGS) ×3 IMPLANT
LOOP VESSEL MAXI BLUE (MISCELLANEOUS) ×2 IMPLANT
MARKER SKIN DUAL TIP RULER LAB (MISCELLANEOUS) ×3 IMPLANT
NDL INSUFFLATION 14GA 120MM (NEEDLE) ×1 IMPLANT
NEEDLE INSUFFLATION 14GA 120MM (NEEDLE) ×3 IMPLANT
NS IRRIG 1000ML POUR BTL (IV SOLUTION) ×1 IMPLANT
POSITIONER SURGICAL ARM (MISCELLANEOUS) ×6 IMPLANT
POUCH SPECIMEN RETRIEVAL 10MM (ENDOMECHANICALS) ×3 IMPLANT
RELOAD STAPLE 60 2.6 WHT THN (STAPLE) IMPLANT
RELOAD STAPLER WHITE 60MM (STAPLE) IMPLANT
SEAL CANN UNIV 5-8 DVNC XI (MISCELLANEOUS) ×4 IMPLANT
SEAL XI 5MM-8MM UNIVERSAL (MISCELLANEOUS) ×8
SET TUBE IRRIG SUCTION NO TIP (IRRIGATION / IRRIGATOR) ×2 IMPLANT
SOLUTION ELECTROLUBE (MISCELLANEOUS) ×3 IMPLANT
SPONGE LAP 4X18 X RAY DECT (DISPOSABLE) ×3 IMPLANT
STAPLE ECHEON FLEX 60 POW ENDO (STAPLE) IMPLANT
STAPLER RELOAD WHITE 60MM (STAPLE)
SURGIFLO W/THROMBIN 8M KIT (HEMOSTASIS) ×3 IMPLANT
SUT ETHILON 3 0 PS 1 (SUTURE) ×3 IMPLANT
SUT MNCRL 3 0 VIOLET RB1 (SUTURE) IMPLANT
SUT MNCRL AB 4-0 PS2 18 (SUTURE) ×6 IMPLANT
SUT MONOCRYL 3 0 RB1 (SUTURE) ×2
SUT VIC AB 0 CT1 27 (SUTURE) ×12
SUT VIC AB 0 CT1 27XBRD ANTBC (SUTURE) ×4 IMPLANT
SUT VIC AB 2-0 SH 27 (SUTURE) ×6
SUT VIC AB 2-0 SH 27X BRD (SUTURE) ×5 IMPLANT
SUT VLOC BARB 180 ABS3/0GR12 (SUTURE) ×3
SUTURE VLOC BRB 180 ABS3/0GR12 (SUTURE) ×2 IMPLANT
TAPE STRIPS DRAPE STRL (GAUZE/BANDAGES/DRESSINGS) ×3 IMPLANT
TOWEL OR 17X26 10 PK STRL BLUE (TOWEL DISPOSABLE) IMPLANT
TOWEL OR NON WOVEN STRL DISP B (DISPOSABLE) ×3 IMPLANT
TRAY FOLEY W/METER SILVER 14FR (SET/KITS/TRAYS/PACK) ×3 IMPLANT
TRAY FOLEY W/METER SILVER 16FR (SET/KITS/TRAYS/PACK) ×3 IMPLANT
TRAY LAPAROSCOPIC (CUSTOM PROCEDURE TRAY) ×1 IMPLANT
TROCAR BLADELESS OPT 5 100 (ENDOMECHANICALS) IMPLANT
TROCAR UNIVERSAL OPT 12M 100M (ENDOMECHANICALS) ×3 IMPLANT
TROCAR XCEL 12X100 BLDLESS (ENDOMECHANICALS) ×3 IMPLANT
WATER STERILE IRR 1500ML POUR (IV SOLUTION) ×4 IMPLANT

## 2015-12-29 NOTE — Transfer of Care (Signed)
Immediate Anesthesia Transfer of Care Note  Patient: Phyllis Pruitt  Procedure(s) Performed: Procedure(s): XI ROBOTIC ASSITED PARTIAL NEPHRECTOMY (Left)  Patient Location: PACU  Anesthesia Type:General  Level of Consciousness: awake, alert  and oriented  Airway & Oxygen Therapy: Patient Spontanous Breathing and Patient connected to face mask oxygen  Post-op Assessment: Report given to RN  Post vital signs: Reviewed  Last Vitals:  Filed Vitals:   12/29/15 1020  BP: 142/84  Pulse: 87  Temp: 36.6 C  Resp: 18    Complications: No apparent anesthesia complications

## 2015-12-29 NOTE — Brief Op Note (Signed)
12/29/2015  6:45 PM  PATIENT:  Phyllis Pruitt  45 y.o. female  PRE-OPERATIVE DIAGNOSIS:  LEFT RENAL MASS  POST-OPERATIVE DIAGNOSIS:  LEFT RENAL MASS  PROCEDURE:  Procedure(s): XI ROBOTIC ASSITED PARTIAL NEPHRECTOMY (Left)  SURGEON:  Surgeon(s) and Role:    * Cleon Gustin, MD - Primary  PHYSICIAN ASSISTANT:   ASSISTANTS: Debbrah Alar, PA  Resident: Jearld Adjutant, MD   ANESTHESIA:   general  EBL:  Total I/O In: 1500 [I.V.:1400; IV Piggyback:100] Out: 840 [Urine:725; Drains:40; Blood:75]  BLOOD ADMINISTERED:none  DRAINS: (1) Jackson-Pratt drain(s) with closed bulb suction in the LLQ and Urinary Catheter (Foley)   LOCAL MEDICATIONS USED:  OTHER exparel  SPECIMEN:  Source of Specimen:  left partial nephrectomy  DISPOSITION OF SPECIMEN:  PATHOLOGY  COUNTS:  YES  TOURNIQUET:  * No tourniquets in log *  DICTATION: .Note written in EPIC  PLAN OF CARE: Admit to inpatient   PATIENT DISPOSITION:  PACU - hemodynamically stable.   Delay start of Pharmacological VTE agent (>24hrs) due to surgical blood loss or risk of bleeding: yes

## 2015-12-29 NOTE — Anesthesia Postprocedure Evaluation (Signed)
Anesthesia Post Note  Patient: Phyllis Pruitt  Procedure(s) Performed: Procedure(s) (LRB): XI ROBOTIC ASSITED PARTIAL NEPHRECTOMY (Left)  Patient location during evaluation: PACU Anesthesia Type: General Level of consciousness: awake and alert Pain management: pain level controlled Vital Signs Assessment: post-procedure vital signs reviewed and stable Respiratory status: spontaneous breathing, nonlabored ventilation, respiratory function stable and patient connected to nasal cannula oxygen Cardiovascular status: blood pressure returned to baseline and stable Postop Assessment: no signs of nausea or vomiting Anesthetic complications: no    Last Vitals:  Filed Vitals:   12/29/15 1540 12/29/15 1600  BP:  144/87  Pulse: 95 88  Temp: 36.7 C   Resp: 15 14    Last Pain:  Filed Vitals:   12/29/15 1618  PainSc: 4                  Oluwatomisin Deman JENNETTE

## 2015-12-29 NOTE — Anesthesia Procedure Notes (Signed)
Procedure Name: Intubation Date/Time: 12/29/2015 12:25 PM Performed by: Lind Covert Pre-anesthesia Checklist: Patient identified, Emergency Drugs available, Suction available, Patient being monitored and Timeout performed Patient Re-evaluated:Patient Re-evaluated prior to inductionOxygen Delivery Method: Circle system utilized Preoxygenation: Pre-oxygenation with 100% oxygen Intubation Type: IV induction Ventilation: Mask ventilation without difficulty Laryngoscope Size: Mac and 4 Grade View: Grade II Tube type: Oral Tube size: 7.0 mm Number of attempts: 1 Airway Equipment and Method: Stylet Placement Confirmation: ETT inserted through vocal cords under direct vision,  positive ETCO2 and breath sounds checked- equal and bilateral Secured at: 21 cm Tube secured with: Tape Dental Injury: Teeth and Oropharynx as per pre-operative assessment

## 2015-12-29 NOTE — Progress Notes (Signed)
    Capecitabine (Xeloda) hold criteria  ANC < 1.5  Pltc < 100K  CrCl < 30 mL/min (contraindication)  SCr > 1.5x baseline (or >2 if baseline unknown)  Bilirubin > 1.5x ULN  Active infection  Acute coronary syndrome  Diarrhea - Grade 2 or higher  Hand / foot syndrome - Grade 2 or higher  Surgery planned this admission  As pt had recent surgery during admission, will hold medication per P and T committee policy and medical executive committee.   Royetta Asal, PharmD, BCPS Pager [phone] 12/29/2015 7:57 PM

## 2015-12-29 NOTE — Anesthesia Preprocedure Evaluation (Addendum)
Anesthesia Evaluation  Patient identified by MRN, date of birth, ID band Patient awake    Reviewed: Allergy & Precautions, NPO status , Patient's Chart, lab work & pertinent test results  Airway Mallampati: II  TM Distance: >3 FB Neck ROM: Full    Dental no notable dental hx.    Pulmonary neg pulmonary ROS,    Pulmonary exam normal breath sounds clear to auscultation       Cardiovascular negative cardio ROS Normal cardiovascular exam Rhythm:Regular Rate:Normal     Neuro/Psych PSYCHIATRIC DISORDERS Depression  Neuromuscular disease    GI/Hepatic negative GI ROS, Neg liver ROS,   Endo/Other  negative endocrine ROS  Renal/GU negative Renal ROS  negative genitourinary   Musculoskeletal negative musculoskeletal ROS (+)   Abdominal (+) + obese,   Peds negative pediatric ROS (+)  Hematology negative hematology ROS (+)   Anesthesia Other Findings   Reproductive/Obstetrics negative OB ROS                             Anesthesia Physical Anesthesia Plan  ASA: II  Anesthesia Plan: General   Post-op Pain Management:    Induction: Intravenous  Airway Management Planned: Oral ETT  Additional Equipment:   Intra-op Plan:   Post-operative Plan: Extubation in OR  Informed Consent: I have reviewed the patients History and Physical, chart, labs and discussed the procedure including the risks, benefits and alternatives for the proposed anesthesia with the patient or authorized representative who has indicated his/her understanding and acceptance.   Dental advisory given  Plan Discussed with: CRNA  Anesthesia Plan Comments:         Anesthesia Quick Evaluation

## 2015-12-29 NOTE — H&P (Signed)
Urology Admission H&P  Chief Complaint: left renal mass  History of Present Illness: Phyllis Pruitt is a 45yo with a hx of breast cancer who was found to have a left renal mass on staging CT scan. She was placed on surveillance and her mass grew 1cm in 6 months. She denies any flank or abdominal pain. No abdominal surgeries   Past Medical History  Diagnosis Date  . Breast cancer of upper-outer quadrant of right female breast ([place]) 01/27/2015  . Depression   . Neuropathy ([place])   . Sickle cell trait [facility])    Past Surgical History  Procedure Laterality Date  . Tubal ligation    . Tonsillectomy    . [place] teeth extraction    . Portacath placement N/A 02/10/2015    Procedure: INSERTION PORT-A-CATH WITH ULTRA SOUND, left subclavian,;  Surgeon: Alphonsa Overall, MD;  Location: [facility];  Service: General;  Laterality: N/A;  . Mastectomy modified radical Right 07/28/2015  . Port-a-cath removal  07/28/2015  . Mastectomy modified radical Right 07/28/2015    Procedure: RIGHT MODIFIED RADICAL MASTECTOMY;  Surgeon: Alphonsa Overall, MD;  Location: [place];  Service: General;  Laterality: Right;  . Port-a-cath removal  07/28/2015    Procedure: REMOVAL PORT-A-CATH;  Surgeon: Alphonsa Overall, MD;  Location: [place];  Service: General;;    Home Medications:  Prescriptions prior to admission  Medication Sig Dispense Refill Last Dose  . capecitabine (XELODA) 500 MG tablet Take 4 tablets (2,000 mg total) by mouth 2 (two) times daily after a meal. (Patient taking differently: Take 1,500 mg/m2 by mouth 2 (two) times daily after a meal. ) 112 tablet 7 12/28/2015 at 2000  . Ginger, Zingiber officinalis, (GINGER ROOT PO) Take 1 tablet by mouth daily.    2weeks  . Multiple Vitamin (MULTIVITAMIN WITH MINERALS) TABS tablet Take 1 tablet by mouth daily.   2weeks  . Probiotic Product (PROBIOTIC PO) Take 1 tablet by mouth daily.   12/28/2015 at 0600  . [DISCONTINUED] HYDROcodone-acetaminophen (NORCO/VICODIN) 5-325 MG tablet Take 1-2  tablets by mouth every 4 (four) hours as needed for moderate pain. 40 tablet 0 Taking  . [place] [facility]   Unknown at Unknown time   Allergies: No Known Allergies  History reviewed. No pertinent family history. Social History:  reports that she has never smoked. She has never used smokeless tobacco. She reports that she does not drink alcohol or use illicit drugs.  Review of Systems  All other systems reviewed and are negative.   Physical Exam:  Vital signs in last 24 hours: Temp:  [97.8 F (36.6 C)] 97.8 F (36.6 C) (03/17 1020) Pulse Rate:  [87] 87 (03/17 1020) Resp:  [18] 18 (03/17 1020) BP: (142)/(84) 142/84 mmHg (03/17 1020) SpO2:  [99 %] 99 % (03/17 1020) Weight:  [87.59 kg (193 lb 1.6 oz)] 87.59 kg (193 lb 1.6 oz) (03/17 1020) Physical Exam  Constitutional: She is oriented to person, place, and time. She appears well-developed and well-nourished.  HENT:  Head: Normocephalic and atraumatic.  Eyes: EOM are normal. Pupils are equal, round, and reactive to light.  Neck: Normal range of motion. No thyromegaly present.  Cardiovascular: Normal rate and regular rhythm.   Respiratory: Effort normal. No respiratory distress.  GI: Soft. She exhibits no distension and no mass. There is no tenderness. There is no rebound and no guarding.  Musculoskeletal: Normal range of motion.  Neurological: She is alert and oriented to person, place, and time.  Skin: Skin is warm and dry.  Psychiatric: She has a normal mood and affect. Her behavior is normal. Judgment and thought content normal.    Laboratory Data:  No results found for this or any previous visit (from the past 24 hour(s)). No results found for this or any previous visit (from the past 240 hour(s)). Creatinine:  Recent Labs  12/25/15 0830  CREATININE 0.74   Baseline Creatinine: 0.7  Impression/Assessment:  45yo with left lower pole renal mass  Plan:  The  risks/benefits/alternatives to left robot assisted laparoscopic partial nephrectomy was explained to the patient and she understands and wishes to proceed with surgery  MCKENZIE, PATRICK L 12/29/2015, 12:13 PM

## 2015-12-30 LAB — HEMOGLOBIN AND HEMATOCRIT, BLOOD
HEMATOCRIT: 29.4 % — AB (ref 36.0–46.0)
Hemoglobin: 9.7 g/dL — ABNORMAL LOW (ref 12.0–15.0)

## 2015-12-30 LAB — BASIC METABOLIC PANEL
Anion gap: 9 (ref 5–15)
BUN: 6 mg/dL (ref 6–20)
CALCIUM: 8.5 mg/dL — AB (ref 8.9–10.3)
CHLORIDE: 104 mmol/L (ref 101–111)
CO2: 23 mmol/L (ref 22–32)
Creatinine, Ser: 0.73 mg/dL (ref 0.44–1.00)
GFR calc non Af Amer: >60 mL/min (ref >60)
GLUCOSE: 303 mg/dL — AB (ref 65–99)
POTASSIUM: 3.7 mmol/L (ref 3.5–5.1)
Sodium: 136 mmol/L (ref 135–145)

## 2015-12-30 LAB — CREATININE, FLUID (PLEURAL, PERITONEAL, JP DRAINAGE): CREAT FL: 1.3 mg/dL

## 2015-12-30 MED ORDER — CAPECITABINE 500 MG PO TABS: 1500.0000 mg | ORAL_TABLET | Freq: Two times a day (BID) | ORAL | Status: DC

## 2015-12-30 NOTE — Progress Notes (Signed)
Patient refuses IV fluids and request  to discontinue it since she is eating and drinking.

## 2015-12-30 NOTE — Progress Notes (Signed)
Urology Progress Note  1 Day Post-Op   Subjective:ROBOTIC ASSITED PARTIAL NEPHRECTOMY (Left) ( 3/17). .Doing well. Neds foley out, JP Cr, and reg diet .      No acute urologic events overnight. Ambulation:   negative Flatus:    positive Bowel movement  negative  Pain: some relief  Objective:  Blood pressure 120/77, pulse 98, temperature 98.2 F (36.8 C), temperature source Oral, resp. rate 16, height 5' 5.5" (1.664 m), weight 87.59 kg (193 lb 1.6 oz), last menstrual period 11/12/2015, SpO2 100 %.  Physical Exam:  General:  No acute distress, awake  Genitourinary:  benign Foley: clear urine    I/O last 3 completed shifts: In: 2145 [I.V.:2000; Other:45; IV Piggyback:100] Out: 1340 [Urine:1225; Drains:40; Blood:75]  Recent Labs     12/29/15  1546  12/30/15  0544  HGB  11.5*  9.7*    Recent Labs     12/30/15  0544  NA  136  K  3.7  CL  104  CO2  23  BUN  6  CREATININE  0.73  CALCIUM  8.5*  GFRNONAA  >60  GFRAA  >60     No results for input(s): INR, APTT in the last 72 hours.  Invalid input(s): PT   Invalid input(s): ABG  Assessment/Plan:  Catheter removed. JP for Cr, and remove Reg diet D/c plan for AM

## 2015-12-30 NOTE — Op Note (Addendum)
Preoperative diagnosis: Left renal mass  Postop diagnosis: Same  Procedure: 1.  Left robot assisted laparoscopic radical partial nephrectomy 2.  Lysis of adhesions, moderate 3. Intraoperative ultrasound with interpretation   Attending: Nicolette Bang  Assistant: Debbrah Alar, PA  Resident: Jearld Adjutant, MD  Anesthesia: General  Estimated blood loss: 75 cc  Drains: 16 French Foley catheter, JP drain  Specimens:Left renal mass with overlying renal fat  Antibiotics: ancef  Findings: 1 artery and 1 vein. 3cm left anterior mid pole renal mass Warm Ischemia time: 34 minutes  Indications: Patient is a 45 year old with a history of 3 cm left renal mass.  The mass was amenable to partial nephrectomy.  After discussing treatment options patient decided to proceed with left robot assisted laparoscopic partial nephrectomy.  Procedure in detail: Prior to procedure consent was obtained. Patient was brought to the operating room and briefing was done sure correct patient, correct procedure, correct site.  General anesthesia was in administered patient was placed in the right lateral decubitus position.  a 36 French catheter was placed. their abdomen and flank was then prepped and draped usual sterile fashion.  A Veress needle was used to obtain pneumoperitoneum.  Once pneumoperitoneum was reestablished to 15 mmHg we then placed a 12 mm camera port lateral to the umbilicus at the latera; edge of rectus.  We then proceeded to place 3 more robotic ports. We then placed an assistant port. We then docked the robot.  We then started this dissection by removing a extensive amount of anterior abdominal wall adhesions.  We then dissected along the white line of [place].  We then reflected the colon medially.  We then identified the psoas muscle.  Once this was done we traced it down to the iliac vessels and identified the ureter.  Once we identified the gonadal vein and ureter were then traced this to the  renal hilum.  The renal vein and renal artery were skeletonized.  We did we identified one renal vein one renal artery. We placed a vessel loop around the renal artery.  We then turned our attention to locating the renal mass. We proceeded to remove the overlying renal fat  Over the mod pole. We then used the ultrasound to identify the renal mass. Once this was done with the circumscribed the mass with electrocautery. We then placed a bulldog clamp on the renal artery and this began our warm ischemia time. Using sharp dissection the mass was removed. Using a 2-0 V lock suture we oversewed the tumor bed. We then used 0 Vicryl with hem-o-locks over a bolster in an interrupted fashion to reapproximate the defect int he kidney. Once this was done we removed the bulldog clamp and noted no residual bleeding.  We then placed the specimen in an Endo Catch bag.  Once the specimen was in the Endo Catch bag we then inspected the left kidney and noted no residual bleeding. We then placed a JP drain in the lower quadrant robot port. THis was secured with a 0 nylon.  We then removed our instruments, undocked the robot, and released the pneumoperitoneum. Once the specimen was removed we then closed the camera and assistant ports with 0 Vicryl in interrupted fashion.  The skin was then subcuticularly closed with 4-0 Monocryl.  We then placed Dermabond over all the incisions.  This included the procedure which resulted by the patient.  Complications: None  Condition: Stable, x-rayed, transferred to PACU.  Plan: Patient is to be admitted for inpatient  stay. The foley catheter will be removed in the morning. They will be started on a clear liquid diet POD#1

## 2015-12-31 NOTE — Discharge Instructions (Signed)

## 2015-12-31 NOTE — Progress Notes (Signed)
Utilization Review Completed.Phyllis Pruitt T3/19/2017  

## 2015-12-31 NOTE — Progress Notes (Signed)
Urology Progress Note  2 Days Post-Op   Subjective: AF VSS     No acute urologic events overnight. Ambulation:   positive Flatus:    positive Bowel movement  negative  Pain: some relief  Objective:  Blood pressure 138/86, pulse 88, temperature 99 F (37.2 C), temperature source Oral, resp. rate 16, height 5' 5.5" (1.664 m), weight 87.59 kg (193 lb 1.6 oz), last menstrual period 11/12/2015, SpO2 99 %.  Physical Exam:  General:  No acute distress, awake  Genitourinary:   nml Foley: out JP remains. 5 ccovernight Cr. 1.3.   I/O last 3 completed shifts: In: 2917.5 [P.O.:360; I.V.:2512.5; Other:45] Out: [id number] [Urine:4450; Drains:35]  Recent Labs     12/29/15  1546  12/30/15  0544  HGB  11.5*  9.7*    Recent Labs     12/30/15  0544  NA  136  K  3.7  CL  104  CO2  23  BUN  6  CREATININE  0.73  CALCIUM  8.5*  GFRNONAA  >60  GFRAA  >60     No results for input(s): INR, APTT in the last 72 hours.  Invalid input(s): PT   Invalid input(s): ABG  Assessment/Plan:  Awaiting JP cr results, then remove JP and d/c.  JP: Cr=1.3. Will remove JP and d/c.

## 2016-01-01 ENCOUNTER — Encounter (HOSPITAL_COMMUNITY): Payer: Self-pay | Admitting: Urology

## 2016-01-04 NOTE — Discharge Summary (Signed)
Physician Discharge Summary  Patient ID: JASHLEY CRONKRIGHT MRN: [id number] DOB/AGE: [birth year] 45 y.o.  Admit date: 12/29/2015 Discharge date: 12/31/2015  Admission Diagnoses: Left renal amss Discharge Diagnoses:  Active Problems:   Renal mass   Discharged Condition: good  Hospital Course: The patient tolerated the procedure well and was transferred to the floor on IV pain meds, IV fluid. On POD#1 foley was removed, pt was started on clear liquid diet and they ambulated in the halls. On POD#2 the patient was transitioned to a regular diet, IVFs were discontinued, and the patient passed flatus. Prior to discharge the pt was tolerating a regular diet, pain was controlled on PO pain meds, they were ambulating without difficulty, and they had normal bowel function.   Consults: None  Significant Diagnostic Studies: none  Treatments: surgery: left robot assisted laparoscopic partial nephrectomy  Discharge Exam: Blood pressure 138/86, pulse 88, temperature 99 F (37.2 C), temperature source Oral, resp. rate 16, height 5' 5.5" (1.664 m), weight 87.59 kg (193 lb 1.6 oz), last menstrual period 11/12/2015, SpO2 99 %. General appearance: alert, cooperative and appears stated age Head: Normocephalic, without obvious abnormality, atraumatic Eyes: conjunctivae/corneas clear. PERRL, EOM's intact. Fundi benign. Nose: Nares normal. Septum midline. Mucosa normal. No drainage or sinus tenderness. Neck: no adenopathy, no carotid bruit, no JVD, supple, symmetrical, trachea midline and thyroid not enlarged, symmetric, no tenderness/mass/nodules Chest wall: no tenderness Cardio: regular rate and rhythm, S1, S2 normal, no murmur, click, rub or gallop GI: soft, non-tender; bowel sounds normal; no masses,  no organomegaly Extremities: extremities normal, atraumatic, no cyanosis or edema Lymph nodes: Cervical, supraclavicular, and axillary nodes normal. Neurologic: Alert and oriented X 3, normal strength and  tone. Normal symmetric reflexes. Normal coordination and gait  Disposition: 01-Home or Self Care  Discharge Instructions    Care order/instruction    Complete by:  As directed   D/c JP drain     Discharge patient    Complete by:  As directed      Discontinue IV    Complete by:  As directed             Medication List    STOP taking these medications        GINGER ROOT PO     multivitamin with minerals Tabs tablet      TAKE these medications        capecitabine 500 MG tablet  Commonly known as:  XELODA  Take 3 tablets (1,500 mg total) by mouth 2 (two) times daily after a meal.     HYDROcodone-acetaminophen 5-325 MG tablet  Commonly known as:  NORCO/VICODIN  Take 1-2 tablets by mouth every 6 (six) hours as needed for moderate pain.     [facility]     PROBIOTIC PO  Take 1 tablet by mouth daily.           Follow-up Information    Follow up with [facility], NP On 01/12/2016.   Specialty:  Urology   Why:  at 8:00   Contact information:   [place] [address] [phone]       Follow up with Cleon Gustin, MD.   Specialty:  Urology   Contact information:   [place] [address] [phone]       Signed: Cleon Gustin 01/04/2016, 8:40 AM

## 2016-01-08 ENCOUNTER — Telehealth: Payer: Self-pay | Admitting: *Deleted

## 2016-01-08 NOTE — Telephone Encounter (Signed)
"  I need to cancel my appointment January 16, 2016.  I see Dr. Denman George April 3rd and can reschedule at that time."

## 2016-01-10 ENCOUNTER — Telehealth: Payer: Self-pay | Admitting: Pharmacist

## 2016-01-10 NOTE — Telephone Encounter (Signed)
..  Oral Chemotherapy Follow-Up Form  Original Start date of oral chemotherapy:   11/17/15  Called patient today to follow up regarding patient's oral chemotherapy medication:  Xeloda  The patient had a partial nephrectomy last week. Xeloda was held for surgery, she has resumed taking it. She states that she feels the same way she felt when she originally started Xeloda. She has been especially tired. She is hopeful that after she has resumed Xeloda for a while, she will begin to feel better again. She is aware that she should call us if she has problems or concerns with her medication.  Will follow up and call patient again in 3 weeks.    Thank you,  Theone Murdoch, PharmD, [facility]

## 2016-01-15 ENCOUNTER — Encounter: Payer: Self-pay | Admitting: Gynecologic Oncology

## 2016-01-15 ENCOUNTER — Ambulatory Visit: Payer: 59 | Attending: Gynecologic Oncology | Admitting: Gynecologic Oncology

## 2016-01-15 VITALS — BP 126/83 | HR 79 | Temp 98.3°F | Resp 16 | Ht 65.5 in | Wt 188.9 lb

## 2016-01-15 DIAGNOSIS — R971 Elevated cancer antigen 125 [CA 125]: Secondary | ICD-10-CM | POA: Insufficient documentation

## 2016-01-15 DIAGNOSIS — Z8550 Personal history of malignant neoplasm of unspecified urinary tract organ: Secondary | ICD-10-CM

## 2016-01-15 DIAGNOSIS — Z85528 Personal history of other malignant neoplasm of kidney: Secondary | ICD-10-CM | POA: Diagnosis present

## 2016-01-15 DIAGNOSIS — F329 Major depressive disorder, single episode, unspecified: Secondary | ICD-10-CM | POA: Diagnosis not present

## 2016-01-15 DIAGNOSIS — N83202 Unspecified ovarian cyst, left side: Secondary | ICD-10-CM | POA: Diagnosis not present

## 2016-01-15 DIAGNOSIS — G629 Polyneuropathy, unspecified: Secondary | ICD-10-CM | POA: Insufficient documentation

## 2016-01-15 DIAGNOSIS — D573 Sickle-cell trait: Secondary | ICD-10-CM | POA: Insufficient documentation

## 2016-01-15 DIAGNOSIS — N83209 Unspecified ovarian cyst, unspecified side: Secondary | ICD-10-CM

## 2016-01-15 DIAGNOSIS — Z853 Personal history of malignant neoplasm of breast: Secondary | ICD-10-CM | POA: Insufficient documentation

## 2016-01-15 DIAGNOSIS — Z905 Acquired absence of kidney: Secondary | ICD-10-CM | POA: Diagnosis not present

## 2016-01-15 NOTE — Progress Notes (Signed)
Consult Note: Gyn-Onc  Consult was requested by Dr. Leo Grosser for the evaluation of Phyllis Pruitt 45 y.o. female  CC:  Chief Complaint  Patient presents with  . Ovarian Cyst    New patient    Assessment/Plan:  Phyllis Pruitt  is a 45 y.o.  year old with a personal history of breast and renal cancer, and a left ovarian cyst in the setting of an elevated CA 125.  I have personally viewed her images from her CT scan of the abdomen and pelvis from January 2017. The appearance of the cyst is consistent with a benign lesion. Additionally, on the [place] report from March 1st, 2017, the cyst had reduced in size to 1cm (it was simple/unilocular and without blood flow). The CA 125 had been draw 12/06/15 and this was prior to her partial nephrectomy.  I discussed with the patient that there are causes of false positives including benign ovarian cysts and other malignancies. I recommend resampling the CA 125. I am suggesting we wait until she is 1 month postop to prevent false elevations from a post-op state. If reducing, she does not require intervention. If increasing, I would recommend surgery (robotic assisted BSO, possible hysterectomy, BSO) for diagnostic purposes.   HPI: Phyllis Pruitt is a 45 year old G4P4 who is seen in consultation at the request of Dr Leo Grosser for a left ovarian cyst and elevated CA 125 in the setting of a personal hx of breast and renal cell carcinoma.  The patient Has a personal history of breast cancer and is treated with Dr. Payton Mccallum for this. As part of the workup for her history of breast cancer she underwent a CT scan of the abdomen and pelvis on 11/01/2015. This identified a 2 cm enhancing renal mass on the left concerning for renal carcinoma. It also identified an approximately 3 cm left ovarian cyst that was not commented on in the report but was visible upon evaluation of the images.  As part of workup for this she saw her Dr. the necessity Haygood and on 12/13/2015  received a transvaginal ultrasound scan which revealed a grossly normal uterus measuring 5.2 x 4.9 x 4 cm with a 3 mm endometrial stripe. The left ovary measured 1.8 x 1.9 x 1.28 cm and the right ovary measured 2.08 x 2.2 x 1.15 cm. The left ovary contained a small simple appearing cystic structure measuring 1.1 x 0.6 x 1.0 cm with no flow seen to this area on call Doppler.  CA-125 had been drawn on 12/06/2015 and was elevated at 130 units per milliliter (upper limit of normal range is 35).  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 She is not recommended to have additional treatment at this time.  Current Meds:  Outpatient Encounter Prescriptions as of 01/15/2016  Medication Sig  . capecitabine (XELODA) 500 MG tablet Take 3 tablets (1,500 mg total) by mouth 2 (two) times daily after a meal.  . HYDROcodone-acetaminophen (NORCO/VICODIN) 5-325 MG tablet Take 1-2 tablets by mouth every 6 (six) hours as needed for moderate pain.  . Probiotic Product (PROBIOTIC PO) Take 1 tablet by mouth daily.  Marland Kitchen sulfamethoxazole-trimethoprim (BACTRIM DS,SEPTRA DS) 800-160 MG tablet TK 1 T PO  BID  . [facility]   No facility-administered encounter medications on file  as of 01/15/2016.    Allergy: No Known Allergies  Social Hx:   Social History   Social History  . Marital Status: Married    Spouse Name: N/A  . Number of Children: N/A  . Years of Education: N/A   Occupational History  . Not on file.   Social History Main Topics  . Smoking status: Never Smoker   . Smokeless tobacco: Never Used  . Alcohol Use: No  . Drug Use: No  . Sexual Activity: Not on file   Other Topics Concern  . Not on file   Social History Narrative    Past Surgical  Hx:  Past Surgical History  Procedure Laterality Date  . Tubal ligation    . Tonsillectomy    . [place] teeth extraction    . Portacath placement N/A 02/10/2015    Procedure: INSERTION PORT-A-CATH WITH ULTRA SOUND, left subclavian,;  Surgeon: Alphonsa Overall, MD;  Location: [facility];  Service: General;  Laterality: N/A;  . Mastectomy modified radical Right 07/28/2015  . Port-a-cath removal  07/28/2015  . Mastectomy modified radical Right 07/28/2015    Procedure: RIGHT MODIFIED RADICAL MASTECTOMY;  Surgeon: Alphonsa Overall, MD;  Location: [facility];  Service: General;  Laterality: Right;  . Port-a-cath removal  07/28/2015    Procedure: REMOVAL PORT-A-CATH;  Surgeon: Alphonsa Overall, MD;  Location: [facility];  Service: General;;  . Robotic assited partial nephrectomy Left 12/29/2015    Procedure: XI ROBOTIC ASSITED PARTIAL NEPHRECTOMY;  Surgeon: Cleon Gustin, MD;  Location: [facility];  Service: Urology;  Laterality: Left;    Past Medical Hx:  Past Medical History  Diagnosis Date  . Breast cancer of upper-outer quadrant of right female breast ([place]) 01/27/2015  . Depression   . Neuropathy ([place])   . Sickle cell trait [facility])     Past Gynecological History:  SVD x 4  Patient's last menstrual period was 11/12/2015.  Family Hx: History reviewed. No pertinent family history.  Review of Systems:  Constitutional  Feels well,    ENT Normal appearing ears and nares bilaterally Skin/Breast  No rash, sores, jaundice, itching, dryness Cardiovascular  No chest pain, shortness of breath, or edema  Pulmonary  No cough or wheeze.  Gastro Intestinal  No nausea, vomitting, or diarrhoea. No bright red blood per rectum, no abdominal pain, change in bowel movement, or constipation.  Genito Urinary  No frequency, urgency, dysuria,  Musculo Skeletal  No myalgia, arthralgia, joint swelling or pain  Neurologic  No weakness, numbness, change in gait,  Psychology  No depression, anxiety, insomnia.   Vitals:  Blood  pressure 126/83, pulse 79, temperature 98.3 F (36.8 C), temperature source Oral, resp. rate 16, height 5' 5.5" (1.664 m), weight 188 lb 14.4 oz (85.684 kg), last menstrual period 11/12/2015, SpO2 100 %.  Physical Exam: WD in NAD Neck  Supple NROM, without any enlargements.  Lymph Node Survey No cervical supraclavicular or inguinal adenopathy Cardiovascular  Pulse normal rate, regularity and rhythm. S1 and S2 normal.  Lungs  Clear to auscultation bilateraly, without wheezes/crackles/rhonchi. Good air movement.  Skin  No rash/lesions/breakdown  Psychiatry  Alert and oriented to person, place, and time  Abdomen  Normoactive bowel sounds, abdomen soft, non-tender and obese without evidence of hernia.  Back No CVA tenderness Genito Urinary  Vulva/vagina: Normal external female genitalia.  No lesions. No discharge or bleeding.  Bladder/urethra:  No lesions or masses, well supported bladder  Vagina: normal  Cervix: Normal appearing, no lesions.  Uterus:  Small, mobile, no parametrial involvement or nodularity.  Adnexa: no palpable masses. Rectal  Good tone, no masses no cul de sac nodularity.  Extremities  No bilateral cyanosis, clubbing or edema.   Donaciano Eva, MD  01/15/2016, 2:46 PM

## 2016-01-15 NOTE — Patient Instructions (Signed)
Plan to have a CA 125 drawn on April 17.  We will contact you with the results.  Please call for any questions or concerns.

## 2016-01-16 ENCOUNTER — Other Ambulatory Visit: Payer: Self-pay

## 2016-01-16 ENCOUNTER — Ambulatory Visit: Payer: 59 | Admitting: Hematology and Oncology

## 2016-01-16 ENCOUNTER — Other Ambulatory Visit: Payer: 59

## 2016-01-17 ENCOUNTER — Telehealth: Payer: Self-pay | Admitting: *Deleted

## 2016-01-17 NOTE — Telephone Encounter (Signed)
VM message from pt requesting reschedule appt that was cancelled with Dr. [name] on 01/16/16.

## 2016-01-23 ENCOUNTER — Other Ambulatory Visit: Payer: Self-pay | Admitting: *Deleted

## 2016-01-29 ENCOUNTER — Other Ambulatory Visit (HOSPITAL_BASED_OUTPATIENT_CLINIC_OR_DEPARTMENT_OTHER): Payer: 59

## 2016-01-29 DIAGNOSIS — N83209 Unspecified ovarian cyst, unspecified side: Secondary | ICD-10-CM

## 2016-01-29 DIAGNOSIS — C50411 Malignant neoplasm of upper-outer quadrant of right female breast: Secondary | ICD-10-CM | POA: Diagnosis not present

## 2016-01-29 LAB — CBC WITH DIFFERENTIAL/PLATELET
BASO%: 0.2 % (ref 0.0–2.0)
BASOS ABS: 0 10*3/uL (ref 0.0–0.1)
EOS ABS: 0.1 10*3/uL (ref 0.0–0.5)
EOS%: 1.7 % (ref 0.0–7.0)
HCT: 31.7 % — ABNORMAL LOW (ref 34.8–46.6)
HEMOGLOBIN: 10.4 g/dL — AB (ref 11.6–15.9)
LYMPH%: 16.4 % (ref 14.0–49.7)
MCH: 26.7 pg (ref 25.1–34.0)
MCHC: 32.8 g/dL (ref 31.5–36.0)
MCV: 81.3 fL (ref 79.5–101.0)
MONO#: 0.5 10*3/uL (ref 0.1–0.9)
MONO%: 8.5 % (ref 0.0–14.0)
NEUT#: 4.3 10*3/uL (ref 1.5–6.5)
NEUT%: 73.2 % (ref 38.4–76.8)
PLATELETS: 212 10*3/uL (ref 145–400)
RBC: 3.9 10*6/uL (ref 3.70–5.45)
RDW: 19.3 % — AB (ref 11.2–14.5)
WBC: 5.9 10*3/uL (ref 3.9–10.3)
lymph#: 1 10*3/uL (ref 0.9–3.3)

## 2016-01-29 LAB — COMPREHENSIVE METABOLIC PANEL
ALBUMIN: 3.2 g/dL — AB (ref 3.5–5.0)
ALK PHOS: 76 U/L (ref 40–150)
ALT: 14 U/L (ref 0–55)
ANION GAP: 7 meq/L (ref 3–11)
AST: 14 U/L (ref 5–34)
BILIRUBIN TOTAL: 0.42 mg/dL (ref 0.20–1.20)
BUN: 8.6 mg/dL (ref 7.0–26.0)
CALCIUM: 8.9 mg/dL (ref 8.4–10.4)
CO2: 27 mEq/L (ref 22–29)
Chloride: 108 mEq/L (ref 98–109)
Creatinine: 0.8 mg/dL (ref 0.6–1.1)
GLUCOSE: 107 mg/dL (ref 70–140)
POTASSIUM: 3.5 meq/L (ref 3.5–5.1)
SODIUM: 143 meq/L (ref 136–145)
Total Protein: 6.9 g/dL (ref 6.4–8.3)

## 2016-01-30 LAB — CA 125: CANCER ANTIGEN (CA) 125: 116.7 U/mL — AB (ref 0.0–38.1)

## 2016-01-31 ENCOUNTER — Telehealth: Payer: Self-pay

## 2016-01-31 NOTE — Telephone Encounter (Signed)
Left message on VM.  Pt to return call to clinic.  Inquiring whether pt held Xeloda x 1 week per discussion between Dr. Judd Gaudier and Dr. [name].

## 2016-02-06 ENCOUNTER — Telehealth: Payer: Self-pay | Admitting: Hematology and Oncology

## 2016-02-06 ENCOUNTER — Ambulatory Visit (HOSPITAL_BASED_OUTPATIENT_CLINIC_OR_DEPARTMENT_OTHER): Payer: 59 | Admitting: Hematology and Oncology

## 2016-02-06 VITALS — BP 135/93 | HR 91 | Temp 98.9°F | Resp 18 | Wt 197.2 lb

## 2016-02-06 DIAGNOSIS — C642 Malignant neoplasm of left kidney, except renal pelvis: Secondary | ICD-10-CM

## 2016-02-06 DIAGNOSIS — G620 Drug-induced polyneuropathy: Secondary | ICD-10-CM

## 2016-02-06 DIAGNOSIS — R197 Diarrhea, unspecified: Secondary | ICD-10-CM | POA: Diagnosis not present

## 2016-02-06 DIAGNOSIS — L271 Localized skin eruption due to drugs and medicaments taken internally: Secondary | ICD-10-CM

## 2016-02-06 DIAGNOSIS — C50411 Malignant neoplasm of upper-outer quadrant of right female breast: Secondary | ICD-10-CM

## 2016-02-06 NOTE — Progress Notes (Signed)
Patient Care Team: [name], MD as PCP - General (Family Medicine) Chauncey Cruel, MD as Consulting Physician (Oncology) Thea Silversmith, MD as Consulting Physician (Radiation Oncology) Mauro Kaufmann, RN as Registered Nurse Rockwell Germany, RN as Registered Nurse Alphonsa Overall, MD as Consulting Physician (General Surgery) Sylvan Cheese, NP as Nurse Practitioner (Hematology and Oncology)  DIAGNOSIS: Breast cancer of upper-outer quadrant of right female breast [facility])   Staging form: Breast, AJCC 7th Edition     Clinical stage from 02/08/2015: Stage IIIA (T3, N1, M0) - Unsigned     Pathologic stage from 07/28/2015: Stage IIA (yT1c, N1a, cM0) - Unsigned   SUMMARY OF ONCOLOGIC HISTORY:   Breast cancer of upper-outer quadrant of right female breast ([place])   01/19/2015 Mammogram Right breast: density with indistinct margin at 12:00, posterior depth; also oval mass with obscured margin at 11:00, middle depth; irregular mass with indistinct margin central to nipple, middle depth   01/23/2015 Initial Biopsy Right breast 9:00 and 10:00 biopsy ([facility]): Grade 3 IDC ER/PR negative, HER-2 negative, Ki-67 77% and 73%; the right axillary lymph node biopsy: ER 22% positive   01/30/2015 Breast MRI Right breast masses 9 and 10:00 position, additional irregular masses with non-mass enhancement upper outer quadrant with one mass extending into upper inner quadrant, multifocal, multicentric 13.7 x 4.6 x 4.6 cm, malignant right axillary lymph node   02/08/2015 Clinical Stage Stage IIIA: T3, N1, M0   02/09/2015 Procedure BreastNext panel revealed VUS BRCA2 gene called [id number] (c.[id number]>G); otherwise no clinically significant variant at ATM, BARD1, BRCA1, BRCA2, BRIP1, CDH1, CHEK2, MRE11A, MUTYH, NBN, NF1, PALB2, PTEN, RAD50, RAD51C, RAD51D, and TP53   02/15/2015 Imaging No evidence of metastatic disease, 2.2 cm enhancing lesion anterior left lower kidney suspicious for solid renal neoplasm   02/20/2015 -  07/10/2015 Neo-Adjuvant Chemotherapy dose dense doxorubicin and cyclophosphamide x 4 followed by Abraxane weekly x 12   07/10/2015 Breast MRI Multiple irregular enhancing masses right breast are less confluent and smaller dominant mass 10:00 2.1 x 1.9 x 1.3 cm; right breast 11:00 mass measures 1.2 x 1.2 x 0.8 cm, decreased right axillary lymph node thickening   07/28/2015 Definitive Surgery Rt mastectomy: Multifocal IDC 2 cm, 1.5 cm. 0.6 cm; 3/13 LN positive, ER/PR HER-2   07/28/2015 Pathologic Stage Stage IIA: T1c N1 M0     09/03/2015 - 10/13/2015 Radiation Therapy Adjuvant radiation therapy: Right chest wall/50.4 [place] Cables @ 1.8 Gray per fraction x 28 fractions. 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 She appears to have had multiple problems with Xeloda including diarrhea, rash on the hands, neuropathy in the right hands, fatigue and decreased appetite. She underwent a nephrectomy for renal cell cancer on 01/29/2016. She also saw Dr. Denman George with GYN regarding the ovarian cyst. She  has no elevated CA-125 level.  REVIEW OF SYSTEMS:   Constitutional: Denies fevers, chills or abnormal weight loss Eyes: Denies blurriness of vision Ears, nose, mouth, throat, and face: Denies mucositis  or sore throat Respiratory: Denies cough, dyspnea or wheezes Cardiovascular: Denies palpitation, chest discomfort Gastrointestinal:  Denies nausea, heartburn or change in bowel habits Skin: Denies abnormal skin rashes Lymphatics: Denies new lymphadenopathy or easy bruising Neurological:Denies numbness, tingling or new weaknesses Behavioral/Psych: Mood is stable, no new changes  Extremities: No lower extremity edema Breast:  denies any pain or lumps or nodules in either breasts All other systems were reviewed with the patient and are negative.  I have reviewed the past medical history, past surgical history, social history and family history with the patient and they are unchanged from previous note.  ALLERGIES:  has No Known Allergies.  MEDICATIONS:  Current Outpatient Prescriptions  Medication Sig Dispense Refill  . capecitabine (XELODA) 500 MG tablet Take 3 tablets (1,500 mg total) by mouth 2 (two) times daily after a meal. 112 tablet 7  . HYDROcodone-acetaminophen (NORCO/VICODIN) 5-325 MG tablet Take 1-2 tablets by mouth every 6 (six) hours as needed for moderate pain. 30 tablet 0  . Probiotic Product (PROBIOTIC PO) Take 1 tablet by mouth daily.    Marland Kitchen sulfamethoxazole-trimethoprim (BACTRIM DS,SEPTRA DS) 800-160 MG tablet TK 1 T PO  BID  0   No current facility-administered medications for this visit.    PHYSICAL EXAMINATION: ECOG PERFORMANCE STATUS: 1 - Symptomatic but completely ambulatory  Filed Vitals:   02/06/16 1149  BP: 135/93  Pulse: 91  Temp: 98.9 F (37.2 C)  Resp: 18   Filed Weights   02/06/16 1149  Weight: 197 lb 3.2 oz (89.449 kg)    GENERAL:alert, no distress and comfortable SKIN: skin color, texture, turgor are normal, no rashes or significant lesions EYES: normal, Conjunctiva are pink and non-injected, sclera clear OROPHARYNX:no exudate, no erythema and lips, buccal mucosa, and tongue normal  NECK: supple, thyroid normal size, non-tender, without  nodularity LYMPH:  no palpable lymphadenopathy in the cervical, axillary or inguinal LUNGS: clear to auscultation and percussion with normal breathing effort HEART: regular rate & rhythm and no murmurs and no lower extremity edema ABDOMEN:abdomen soft, non-tender and normal bowel sounds MUSCULOSKELETAL:no cyanosis of digits and no clubbing  NEURO: alert & oriented x 3 with fluent speech, no focal motor/sensory deficits EXTREMITIES: No lower extremity edema   LABORATORY DATA:  I have reviewed the data as listed   Chemistry      Component Value Date/Time   NA 143 01/29/2016 0934   NA 136 12/30/2015 0544   K 3.5 01/29/2016 0934   K 3.7 12/30/2015 0544   CL 104 12/30/2015 0544   CO2 27 01/29/2016 0934   CO2 23 12/30/2015 0544   BUN 8.6 01/29/2016 0934   BUN 6 12/30/2015 0544   CREATININE 0.8 01/29/2016 0934   CREATININE 0.73 12/30/2015 0544      Component Value Date/Time   CALCIUM 8.9 01/29/2016 0934   CALCIUM 8.5* 12/30/2015 0544   ALKPHOS 76 01/29/2016 0934   ALKPHOS 68 07/26/2015 0850   AST 14 01/29/2016 0934   AST 21 07/26/2015 0850   ALT 14 01/29/2016 0934   ALT 23 07/26/2015 0850   BILITOT 0.42 01/29/2016 0934   BILITOT 0.5 07/26/2015 0850     Lab Results  Component Value Date   WBC 5.9 01/29/2016   HGB 10.4* 01/29/2016   HCT 31.7* 01/29/2016   MCV 81.3 01/29/2016  PLT 212 01/29/2016   NEUTROABS 4.3 01/29/2016   ASSESSMENT & PLAN:  Renal cell carcinoma of left kidney ([facility]) Left renal mass partial resection 01/29/2016: 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 There is a slight concern with a positive margin however the recommendation from urology is close observation. There is no role of adjuvant therapy for renal cell  carcinoma. ---------------------------------------------------------------------------------------------------------------------------------------------------------------- 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 0.6 cm; 3/13 LN positive, T1c N1 M0 stage IIA, ER/PR HER-2 negative. Adjuvant XRT 09/03/15 to 10/13/15  Treatment Plan: Adjuvant Xeloda X 6 months (8 cycles) Xeloda toxicities: 1. Diarrhea 2. Mild hand-foot syndrome I decreased the dosage of Xeloda to 1000 and evidence by mouth twice a day   Severe peripheral neuropathy: this is markedly better than before. She still has numbness in the fingers and toes. Weight gain: Patient is struggling with this more than anything else. Renal mass: CT abd 11/01/15: Slight interval enlargement of the lower pole enh renal mass measuring 2 cm. Dental biopsy: Patient has a dental lesion that needs to be biopsied. She will be seeing a dentist soon to go through this. I provided her with contact information for an oral surgeon.  Return to clinic in 6 weeks  No orders of the defined types were placed in this encounter.   The patient has a good understanding of the overall plan. she agrees with it. she will call with any problems that may develop before the next visit here.   Rulon Eisenmenger, MD 02/06/2016

## 2016-02-06 NOTE — Assessment & Plan Note (Signed)
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 There is a slight concern with a positive margin however the recommendation from urology is close observation. There is no role of adjuvant therapy for renal cell carcinoma.

## 2016-02-06 NOTE — Assessment & Plan Note (Signed)
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 0.6 cm; 3/13 LN positive, T1c N1 M0 stage IIA, ER/PR HER-2 negative. Adjuvant XRT 09/03/15 to 10/13/15  Treatment Plan: Adjuvant Xeloda X 6 months (8 cycles) I discussed risks and benefits of Xeloda including the risk of diarrhea and hand-foot syndrome. She would benefit from 2000 mg by mouth twice a day. However for the first month instructed her to start at 1500 mg by mouth twice a day. I plan to start Xeloda February 1 week.  Severe peripheral neuropathy: this is markedly better than before. She still has numbness in the fingers and toes. Weight gain: Patient is struggling with this more than anything else. Renal mass: CT abd 11/01/15: Slight interval enlargement of the lower pole enh renal mass measuring 2 cm.  Return to clinic in 3 weeks with third cycle of Xeloda.

## 2016-02-06 NOTE — Telephone Encounter (Signed)
appt made and avs printed °

## 2016-02-15 ENCOUNTER — Telehealth: Payer: Self-pay | Admitting: Pharmacist

## 2016-02-15 NOTE — Telephone Encounter (Signed)
Attempted to reach patient for follow up/counseling on oral medication, Xeloda. No answer. Left VM for patient to call back with any issues, concerns or questions.   Thank you, Raul Del, PharmD, BCPS, BCOP [facility] [phone]

## 2016-02-20 ENCOUNTER — Other Ambulatory Visit: Payer: Self-pay | Admitting: Oral Surgery

## 2016-02-20 ENCOUNTER — Telehealth: Payer: Self-pay | Admitting: Medical Oncology

## 2016-02-20 NOTE — Telephone Encounter (Signed)
PREVENT LVMOM with patient regarding her upcoming 12 month on-study assessments. Asked patient to return my call so that we can schedule for her to come in at her earliest convinence for these study required assessments.

## 2016-02-22 ENCOUNTER — Telehealth: Payer: Self-pay | Admitting: Medical Oncology

## 2016-02-22 NOTE — Telephone Encounter (Signed)
PREVENT: LVMOM with patient to follow-up with her from previous voicemail I had left. Informed patient that I can place 12 month study questionnaire in mail for her to complete, along with a return self-addressed stamped envelope. This way she can complete questionnaire at home and place in mail with out having to come to clinic. Asked patient to return call to me letting me know that this is ok with her.  Adele Dan, RN, BSN Clinical Research 02/22/2016 3:58 PM

## 2016-02-23 ENCOUNTER — Telehealth: Payer: Self-pay | Admitting: Medical Oncology

## 2016-02-23 NOTE — Telephone Encounter (Signed)
PREVENT: Patient LVMOM with me informing me that she's ok with having the questionnaire mailed to her. Call back to patient, [facility] for her letting her know that the questionnaire with be placed in mail today along with a postage paid envelope for her to mail back completed form to me. I thanked patient and encouraged her to contact me with any questions or concerns she may have.  Adele Dan, RN, BSN Clinical Research 02/23/2016 2:09 PM

## 2016-03-05 ENCOUNTER — Encounter: Payer: Self-pay | Admitting: Medical Oncology

## 2016-03-05 DIAGNOSIS — C50411 Malignant neoplasm of upper-outer quadrant of right female breast: Secondary | ICD-10-CM

## 2016-03-05 NOTE — Progress Notes (Signed)
PREVENT: 12 month Patient dropped off completed 12 month questionnaires this morning. Questionnaire was mailed to her on the 12th. Patient reports she is doing well. Denies questions. I thanked her for taking the time to complete and deliver the questionnaire to me and her continued support of study. Patient was encouraged her to call Dr. [name] or myself should she have any questions or concerns. Adele Dan, RN, BSN Clinical Research 03/05/2016 1:35 PM

## 2016-03-20 ENCOUNTER — Other Ambulatory Visit (HOSPITAL_BASED_OUTPATIENT_CLINIC_OR_DEPARTMENT_OTHER): Payer: 59

## 2016-03-20 ENCOUNTER — Telehealth: Payer: Self-pay | Admitting: Hematology and Oncology

## 2016-03-20 ENCOUNTER — Encounter: Payer: Self-pay | Admitting: Hematology and Oncology

## 2016-03-20 ENCOUNTER — Other Ambulatory Visit: Payer: Self-pay | Admitting: Hematology and Oncology

## 2016-03-20 ENCOUNTER — Ambulatory Visit (HOSPITAL_BASED_OUTPATIENT_CLINIC_OR_DEPARTMENT_OTHER): Payer: 59 | Admitting: Hematology and Oncology

## 2016-03-20 VITALS — BP 135/92 | HR 79 | Temp 98.5°F | Resp 18 | Ht 65.5 in | Wt 198.3 lb

## 2016-03-20 DIAGNOSIS — C642 Malignant neoplasm of left kidney, except renal pelvis: Secondary | ICD-10-CM | POA: Diagnosis not present

## 2016-03-20 DIAGNOSIS — C50411 Malignant neoplasm of upper-outer quadrant of right female breast: Secondary | ICD-10-CM

## 2016-03-20 DIAGNOSIS — R635 Abnormal weight gain: Secondary | ICD-10-CM | POA: Diagnosis not present

## 2016-03-20 DIAGNOSIS — G620 Drug-induced polyneuropathy: Secondary | ICD-10-CM

## 2016-03-20 LAB — COMPREHENSIVE METABOLIC PANEL
ALBUMIN: 3.2 g/dL — AB (ref 3.5–5.0)
ALK PHOS: 98 U/L (ref 40–150)
ALT: 20 U/L (ref 0–55)
AST: 22 U/L (ref 5–34)
Anion Gap: 5 mEq/L (ref 3–11)
BUN: 8.7 mg/dL (ref 7.0–26.0)
CHLORIDE: 105 meq/L (ref 98–109)
CO2: 28 mEq/L (ref 22–29)
Calcium: 8.9 mg/dL (ref 8.4–10.4)
Creatinine: 0.8 mg/dL (ref 0.6–1.1)
GLUCOSE: 101 mg/dL (ref 70–140)
POTASSIUM: 3.4 meq/L — AB (ref 3.5–5.1)
SODIUM: 138 meq/L (ref 136–145)
Total Bilirubin: 0.49 mg/dL (ref 0.20–1.20)
Total Protein: 7.2 g/dL (ref 6.4–8.3)

## 2016-03-20 LAB — CBC WITH DIFFERENTIAL/PLATELET
BASO%: 1.1 % (ref 0.0–2.0)
BASOS ABS: 0.1 10*3/uL (ref 0.0–0.1)
EOS ABS: 0.1 10*3/uL (ref 0.0–0.5)
EOS%: 1.2 % (ref 0.0–7.0)
HCT: 33.9 % — ABNORMAL LOW (ref 34.8–46.6)
HEMOGLOBIN: 11.1 g/dL — AB (ref 11.6–15.9)
LYMPH%: 12.7 % — AB (ref 14.0–49.7)
MCH: 27.1 pg (ref 25.1–34.0)
MCHC: 32.6 g/dL (ref 31.5–36.0)
MCV: 83 fL (ref 79.5–101.0)
MONO#: 0.5 10*3/uL (ref 0.1–0.9)
MONO%: 7.8 % (ref 0.0–14.0)
NEUT#: 5.4 10*3/uL (ref 1.5–6.5)
NEUT%: 77.2 % — AB (ref 38.4–76.8)
Platelets: 265 10*3/uL (ref 145–400)
RBC: 4.08 10*6/uL (ref 3.70–5.45)
RDW: 19.3 % — AB (ref 11.2–14.5)
WBC: 7 10*3/uL (ref 3.9–10.3)
lymph#: 0.9 10*3/uL (ref 0.9–3.3)

## 2016-03-20 NOTE — Progress Notes (Signed)
Patient Care Team: Maurice Small, MD as PCP - General (Family Medicine) Lowella Dell, MD as Consulting Physician (Oncology) Lurline Hare, MD as Consulting Physician (Radiation Oncology) Pershing Proud, RN as Registered Nurse Donnelly Angelica, RN as Registered Nurse Ovidio Kin, MD as Consulting Physician (General Surgery) Salomon Fick, NP as Nurse Practitioner (Hematology and Oncology)  DIAGNOSIS: Breast cancer of upper-outer quadrant of right female breast [facility])   Staging form: Breast, AJCC 7th Edition     Clinical stage from 02/08/2015: Stage IIIA (T3, N1, M0) - Unsigned     Pathologic stage from 07/28/2015: Stage IIA (yT1c, N1a, cM0) - Unsigned   SUMMARY OF ONCOLOGIC HISTORY:   Breast cancer of upper-outer quadrant of right female breast (HCC)   01/19/2015 Mammogram Right breast: density with indistinct margin at 12:00, posterior depth; also oval mass with obscured margin at 11:00, middle depth; irregular mass with indistinct margin central to nipple, middle depth   01/23/2015 Initial Biopsy Right breast 9:00 and 10:00 biopsy ([facility]): Grade 3 IDC ER/PR negative, HER-2 negative, Ki-67 77% and 73%; the right axillary lymph node biopsy: ER 22% positive   01/30/2015 Breast MRI Right breast masses 9 and 10:00 position, additional irregular masses with non-mass enhancement upper outer quadrant with one mass extending into upper inner quadrant, multifocal, multicentric 13.7 x 4.6 x 4.6 cm, malignant right axillary lymph node   02/08/2015 Clinical Stage Stage IIIA: T3, N1, M0   02/09/2015 Procedure BreastNext panel revealed VUS BRCA2 gene called [id number] (c.[id number]>G); otherwise no clinically significant variant at ATM, BARD1, BRCA1, BRCA2, BRIP1, CDH1, CHEK2, MRE11A, MUTYH, NBN, NF1, PALB2, PTEN, RAD50, RAD51C, RAD51D, and TP53   02/15/2015 Imaging No evidence of metastatic disease, 2.2 cm enhancing lesion anterior left lower kidney suspicious for solid renal neoplasm   02/20/2015 -  07/10/2015 Neo-Adjuvant Chemotherapy dose dense doxorubicin and cyclophosphamide x 4 followed by Abraxane weekly x 12   07/10/2015 Breast MRI Multiple irregular enhancing masses right breast are less confluent and smaller dominant mass 10:00 2.1 x 1.9 x 1.3 cm; right breast 11:00 mass measures 1.2 x 1.2 x 0.8 cm, decreased right axillary lymph node thickening   07/28/2015 Definitive Surgery Rt mastectomy: Multifocal IDC 2 cm, 1.5 cm. 0.6 cm; 3/13 LN positive, ER/PR HER-2   07/28/2015 Pathologic Stage Stage IIA: T1c N1 M0     09/03/2015 - 10/13/2015 Radiation Therapy Adjuvant radiation therapy: Right chest wall/50.4 Wallace Cullens @ 1.8 Gray per fraction x 28 fractions. 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 Because of that her symptoms have improved. She continues to have issues with weight gain. She is complaining of left hip pain  which does not seem to be getting better. She takes pain medications over-the-counter anti-inflammatory drugs to help with the pain. Because of this pain she is unable to exercise. The pain appears to get worse when she exerts.  REVIEW OF  SYSTEMS:   Constitutional: Denies fevers, chills or abnormal weight loss Eyes: Denies blurriness of vision Ears, nose, mouth, throat, and face: Denies mucositis or sore throat Respiratory: Denies cough, dyspnea or wheezes Cardiovascular: Denies palpitation, chest discomfort Gastrointestinal:  Denies nausea, heartburn or change in bowel habits Skin: Denies abnormal skin rashes Lymphatics: Denies new lymphadenopathy or easy bruising Neurological:Denies numbness, tingling or new weaknesses Behavioral/Psych: Mood is stable, no new changes  Extremities: Left hip pain  All other systems were reviewed with the patient and are negative.  I have reviewed the past medical history, past surgical history, social history and family history with the patient and they are unchanged from previous note.  ALLERGIES:  has No Known Allergies.  MEDICATIONS:  Current Outpatient Prescriptions  Medication Sig Dispense Refill  . capecitabine (XELODA) 500 MG tablet Take 3 tablets (1,500 mg total) by mouth 2 (two) times daily after a meal. 112 tablet 7  . HYDROcodone-acetaminophen (NORCO/VICODIN) 5-325 MG tablet Take 1-2 tablets by mouth every 6 (six) hours as needed for moderate pain. 30 tablet 0  . Probiotic Product (PROBIOTIC PO) Take 1 tablet by mouth daily.    Marland Kitchen sulfamethoxazole-trimethoprim (BACTRIM DS,SEPTRA DS) 800-160 MG tablet TK 1 T PO  BID  0   No current facility-administered medications for this visit.    PHYSICAL EXAMINATION: ECOG PERFORMANCE STATUS: 1 - Symptomatic but completely ambulatory  Filed Vitals:   03/20/16 1108  BP: 135/92  Pulse: 79  Temp: 98.5 F (36.9 C)  Resp: 18   Filed Weights   03/20/16 1108  Weight: 198 lb 4.8 oz (89.948 kg)    GENERAL:alert, no distress and comfortable SKIN: skin color, texture, turgor are normal, no rashes or significant lesions EYES: normal, Conjunctiva are pink and non-injected, sclera clear OROPHARYNX:no exudate, no erythema and lips,  buccal mucosa, and tongue normal  NECK: supple, thyroid normal size, non-tender, without nodularity LYMPH:  no palpable lymphadenopathy in the cervical, axillary or inguinal LUNGS: clear to auscultation and percussion with normal breathing effort HEART: regular rate & rhythm and no murmurs and no lower extremity edema ABDOMEN:abdomen soft, non-tender and normal bowel sounds MUSCULOSKELETAL:no cyanosis of digits and no clubbing  NEURO: alert & oriented x 3 with fluent speech, no focal motor/sensory deficits EXTREMITIES: No lower extremity edema   LABORATORY DATA:  I have reviewed the data as listed   Chemistry      Component Value Date/Time   NA 138 03/20/2016 1054   NA 136 12/30/2015 0544   K 3.4* 03/20/2016 1054   K 3.7 12/30/2015 0544   CL 104 12/30/2015 0544   CO2 28 03/20/2016 1054   CO2 23 12/30/2015 0544   BUN 8.7 03/20/2016 1054   BUN 6 12/30/2015 0544   CREATININE 0.8 03/20/2016 1054   CREATININE 0.73 12/30/2015 0544      Component Value Date/Time   CALCIUM 8.9 03/20/2016 1054   CALCIUM 8.5* 12/30/2015 0544   ALKPHOS 98 03/20/2016 1054   ALKPHOS 68 07/26/2015 0850   AST 22 03/20/2016 1054   AST 21 07/26/2015 0850   ALT 20 03/20/2016 1054   ALT 23 07/26/2015 0850   BILITOT 0.49 03/20/2016 1054   BILITOT 0.5 07/26/2015 0850       Lab Results  Component Value Date   WBC 7.0 03/20/2016   HGB 11.1* 03/20/2016   HCT 33.9* 03/20/2016   MCV 83.0 03/20/2016   PLT 265 03/20/2016   NEUTROABS 5.4 03/20/2016     ASSESSMENT & PLAN:  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 0.6 cm; 3/13 LN positive, T1c N1 M0 stage IIA, ER/PR HER-2 negative. Adjuvant XRT 09/03/15 to 10/13/15  Treatment Plan: Adjuvant Xeloda X 6 months (8 cycles) started 11/15/2015 Xeloda toxicities: 1. Diarrhea 2. Mild hand-foot syndrome I decreased the dosage of Xeloda to 1000 and evidence by mouth twice a day  Xeloda to be completed July 2017. Patient is meeting with plastic surgery next week.  Severe peripheral neuropathy: this is markedly better than before. She still has numbness in the fingers and toes. Weight gain: Patient is struggling with this more than anything else. 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 No role for adjuvant therapy   Orders Placed This Encounter  Procedures  . MM DIAG BREAST TOMO UNI LEFT    Standing Status: Future     Number of Occurrences:      Standing Expiration Date: 05/20/2017    Scheduling Instructions:     Patient wishes to change to Breast center    Order Specific Question:  Reason for Exam (SYMPTOM  OR DIAGNOSIS REQUIRED)    Answer:  Annual mammogram with H/O Right breast cancer S/P mastectomy    Order Specific Question:  Is the patient pregnant?    Answer:  No    Order Specific Question:  Preferred imaging location?    Answer:  [facility] [place]   The patient has a good understanding of the overall plan. she agrees with it. she will call with any problems that may develop before the next visit here.   Rulon Eisenmenger, MD 03/20/2016

## 2016-03-20 NOTE — Assessment & Plan Note (Signed)
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 0.6 cm; 3/13 LN positive, T1c N1 M0 stage IIA, ER/PR HER-2 negative. Adjuvant XRT 09/03/15 to 10/13/15  Treatment Plan: Adjuvant Xeloda X 6 months (8 cycles) started 11/15/2015 Xeloda toxicities: 1. Diarrhea 2. Mild hand-foot syndrome I decreased the dosage of Xeloda to 1000 and evidence by mouth twice a day  Xeloda completed in August 2017.  Severe peripheral neuropathy: this is markedly better than before. She still has numbness in the fingers and toes. Weight gain: Patient is struggling with this more than anything else. Mandible biopsy: Scant material, no malignancy Return to clinic in 2 months for follow-up

## 2016-03-20 NOTE — Telephone Encounter (Signed)
appt made and avs printed °

## 2016-03-20 NOTE — Assessment & Plan Note (Signed)
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 No role for adjuvant therapy

## 2016-04-01 ENCOUNTER — Ambulatory Visit
Admission: RE | Admit: 2016-04-01 | Discharge: 2016-04-01 | Disposition: A | Discharge status: Home / Self Care | Payer: 59 | Source: Ambulatory Visit | Attending: Hematology and Oncology | Admitting: Hematology and Oncology

## 2016-04-01 DIAGNOSIS — C50411 Malignant neoplasm of upper-outer quadrant of right female breast: Secondary | ICD-10-CM

## 2016-04-23 ENCOUNTER — Encounter (HOSPITAL_COMMUNITY): Payer: Self-pay

## 2016-04-23 ENCOUNTER — Encounter (HOSPITAL_COMMUNITY)
Admission: RE | Admit: 2016-04-23 | Discharge: 2016-04-23 | Disposition: A | Discharge status: Home / Self Care | Payer: 59 | Source: Ambulatory Visit | Attending: Plastic Surgery | Admitting: Plastic Surgery

## 2016-04-23 DIAGNOSIS — Z01812 Encounter for preprocedural laboratory examination: Secondary | ICD-10-CM | POA: Diagnosis present

## 2016-04-23 HISTORY — DX: Anemia, unspecified: D64.9

## 2016-04-23 LAB — BASIC METABOLIC PANEL
Anion gap: 6 (ref 5–15)
CALCIUM: 8.8 mg/dL — AB (ref 8.9–10.3)
CO2: 28 mmol/L (ref 22–32)
CREATININE: 0.82 mg/dL (ref 0.44–1.00)
Chloride: 104 mmol/L (ref 101–111)
GFR calc non Af Amer: >60 mL/min (ref >60)
GLUCOSE: 115 mg/dL — AB (ref 65–99)
Potassium: 3.2 mmol/L — ABNORMAL LOW (ref 3.5–5.1)
Sodium: 138 mmol/L (ref 135–145)

## 2016-04-23 LAB — CBC WITH DIFFERENTIAL/PLATELET
BASOS PCT: 0 %
Basophils Absolute: 0 10*3/uL (ref 0.0–0.1)
EOS ABS: 0.2 10*3/uL (ref 0.0–0.7)
Eosinophils Relative: 2 %
HCT: 31.4 % — ABNORMAL LOW (ref 36.0–46.0)
Hemoglobin: 10.4 g/dL — ABNORMAL LOW (ref 12.0–15.0)
LYMPHS ABS: 1.5 10*3/uL (ref 0.7–4.0)
Lymphocytes Relative: 18 %
MCH: 27.4 pg (ref 26.0–34.0)
MCHC: 33.1 g/dL (ref 30.0–36.0)
MCV: 82.6 fL (ref 78.0–100.0)
MONO ABS: 0.5 10*3/uL (ref 0.1–1.0)
MONOS PCT: 6 %
Neutro Abs: 6.1 10*3/uL (ref 1.7–7.7)
Neutrophils Relative %: 74 %
Platelets: 276 10*3/uL (ref 150–400)
RBC: 3.8 MIL/uL — ABNORMAL LOW (ref 3.87–5.11)
RDW: 17.8 % — AB (ref 11.5–15.5)
WBC: 8.3 10*3/uL (ref 4.0–10.5)

## 2016-04-23 LAB — HCG, SERUM, QUALITATIVE: Preg, Serum: NEGATIVE

## 2016-04-23 NOTE — Pre-Procedure Instructions (Signed)
CERENITY HARDWELL  04/23/2016      [id number] [name], [place] [address] Phone: [phone] Fax: [phone]  [facility] [place], [place] - [address] AT [place] [place] [address] Phone: [phone] Fax: [phone]    Your procedure is scheduled on Friday July 14 at 0715  Report to [place] at [email or web address].M.  Call this number if you have problems the morning of surgery:  [phone]   Remember:  Do not eat food or drink liquids after midnight.   Take these medicines the morning of surgery with A SIP OF WATER pain pill if you need it   7 days prior to surgery STOP taking any Aspirin, Aleve, Naproxen, Ibuprofen, Motrin, Advil, Goody's, BC's, all herbal medications, fish oil, and all vitamins    Do not wear jewelry, make-up or nail polish.  Do not wear lotions, powders, or perfumes.  You may NOT wear deoderant.  Do not shave 48 hours prior to surgery.   Do not bring valuables to the hospital.  [facility] is not responsible for any belongings or valuables.  Contacts, dentures or bridgework may not be worn into surgery.  Leave your suitcase in the car.  After surgery it may be brought to your room.  For patients admitted to the hospital, discharge time will be determined by your treatment team.  Patients discharged the day of surgery will not be allowed to drive home.    Special instructions:   [facility]- Preparing For Surgery  Before surgery, you can play an important role. Because skin is not sterile, your skin needs to be as free of germs as possible. You can reduce the number of germs on your skin by washing with CHG (chlorahexidine gluconate) Soap before surgery.  CHG is an antiseptic cleaner which kills germs and bonds with the skin to continue killing germs even after washing.  Please do not use if you have  an allergy to CHG or antibacterial soaps. If your skin becomes reddened/irritated stop using the CHG.  Do not shave (including legs and underarms) for at least 48 hours prior to first CHG shower. It is OK to shave your face.  Please follow these instructions carefully.   1. Shower the NIGHT BEFORE SURGERY and the MORNING OF SURGERY with CHG.   2. If you chose to wash your hair, wash your hair first as usual with your normal shampoo.  3. After you shampoo, rinse your hair and body thoroughly to remove the shampoo.  4. Use CHG as you would any other liquid soap. You can apply CHG directly to the skin and wash gently with a scrungie or a clean washcloth.   5. Apply the CHG Soap to your body ONLY FROM THE NECK DOWN.  Do not use on open wounds or open sores. Avoid contact with your eyes, ears, mouth and genitals (private parts). Wash genitals (private parts) with your normal soap.  6. Wash thoroughly, paying special attention to the area where your surgery will be performed.  7. Thoroughly rinse your body with warm water from the neck down.  8. DO NOT shower/wash with your normal soap after using and rinsing off the CHG Soap.  9. Pat yourself dry with a CLEAN TOWEL.   10. Wear CLEAN PAJAMAS   11. Place CLEAN SHEETS on your bed  the night of your first shower and DO NOT SLEEP WITH PETS.    Day of Surgery: Do not apply any deodorants/lotions. Please wear clean clothes to the hospital/surgery center.      Please read over the following fact sheets that you were given. Pain Booklet

## 2016-04-23 NOTE — Progress Notes (Addendum)
PCP - [name] Cardiologist -  denies  Chest x-ray - not needed EKG - 01/01/16 Stress Test - denies ECHO - 02/15/15 Cardiac Cath -denies     Patient denies shortness of breath, fever, cough and chest pain at PAT appointment  Patient had a tubal ligation and a right mastectomy

## 2016-04-24 ENCOUNTER — Other Ambulatory Visit: Payer: Self-pay | Admitting: *Deleted

## 2016-04-24 ENCOUNTER — Telehealth: Payer: Self-pay | Admitting: *Deleted

## 2016-04-24 NOTE — Progress Notes (Signed)
Anesthesia Chart Review:  Pt is a 45 year old female scheduled for R TRAM flap on 04/26/2016 with Irene Limbo, MD.   PMH includes:  Breast cancer, anemia, sickle cell trait. Never smoker. BMI 33. S/p L partial nephrectomy 12/29/15. S/p R mastectomy 07/28/15.   Medications include: capecitabine  Preoperative labs reviewed.    EKG is outdated and was not obtained at PAT. Will get EKG DOS.   Echo 02/15/15:  - Left ventricle: The cavity size was normal. Wall thickness was normal. Systolic function was normal. The estimated ejection fraction was in the range of 60% to 65%. Wall motion was normal; there were no regional wall motion abnormalities. Features are consistent with a pseudonormal left ventricular filling pattern, with concomitant abnormal relaxation and increased filling pressure (grade 2 diastolic dysfunction).  If EKG acceptable DOS, I anticipate pt can proceed as scheduled.   Lyneah Vandebogart, FNP-BC [facility]/Anesthesiology Phone: [phone] 04/24/2016 10:31 AM

## 2016-04-24 NOTE — Telephone Encounter (Signed)
Received call from patient stating she would like to see a dietician.  She is currently taking Xeloda and getting ready for her reconstruction.  She is having a hard time loosing weight despite everything she states she is doing (drinking lots of water, exercising 2 times a day, eating healthy).  She is frustrated and doesn't know what else to do.  POF placed for a consult with Barb.

## 2016-04-30 ENCOUNTER — Telehealth: Payer: Self-pay | Admitting: Hematology and Oncology

## 2016-04-30 NOTE — Telephone Encounter (Signed)
Left message for patient re nutrition appointment 7/19.

## 2016-05-01 ENCOUNTER — Encounter: Payer: 59 | Admitting: Nutrition

## 2016-05-01 ENCOUNTER — Telehealth: Payer: Self-pay | Admitting: Hematology and Oncology

## 2016-05-01 NOTE — Telephone Encounter (Signed)
Called patient to confirm appointment. Left message. Appointment letter and schedule mailed. Maria F. °

## 2016-05-10 ENCOUNTER — Telehealth: Payer: Self-pay | Admitting: Nutrition

## 2016-05-10 NOTE — Telephone Encounter (Signed)
Contacted patient by phone to invite her to Live Well Class beginning in August. Patient is interested in losing weight and was interested in attending 6 week class.  I mailed patient the information. She would like to cancel her nutrition appointment on August 2.

## 2016-05-15 ENCOUNTER — Encounter: Payer: 59 | Admitting: Nutrition

## 2016-05-20 ENCOUNTER — Other Ambulatory Visit: Payer: Self-pay

## 2016-05-20 DIAGNOSIS — C50411 Malignant neoplasm of upper-outer quadrant of right female breast: Secondary | ICD-10-CM

## 2016-05-21 ENCOUNTER — Encounter: Payer: Self-pay | Admitting: Hematology and Oncology

## 2016-05-21 ENCOUNTER — Ambulatory Visit (HOSPITAL_BASED_OUTPATIENT_CLINIC_OR_DEPARTMENT_OTHER): Payer: 59 | Admitting: Hematology and Oncology

## 2016-05-21 ENCOUNTER — Other Ambulatory Visit (HOSPITAL_BASED_OUTPATIENT_CLINIC_OR_DEPARTMENT_OTHER): Payer: 59

## 2016-05-21 ENCOUNTER — Telehealth: Payer: Self-pay | Admitting: Hematology and Oncology

## 2016-05-21 VITALS — BP 134/78 | HR 82 | Temp 98.7°F | Resp 18 | Ht 65.5 in | Wt 199.0 lb

## 2016-05-21 DIAGNOSIS — C50411 Malignant neoplasm of upper-outer quadrant of right female breast: Secondary | ICD-10-CM

## 2016-05-21 DIAGNOSIS — T451X5A Adverse effect of antineoplastic and immunosuppressive drugs, initial encounter: Principal | ICD-10-CM

## 2016-05-21 DIAGNOSIS — C642 Malignant neoplasm of left kidney, except renal pelvis: Secondary | ICD-10-CM | POA: Diagnosis not present

## 2016-05-21 DIAGNOSIS — G620 Drug-induced polyneuropathy: Secondary | ICD-10-CM | POA: Diagnosis not present

## 2016-05-21 LAB — CBC WITH DIFFERENTIAL/PLATELET
BASO%: 0.7 % (ref 0.0–2.0)
Basophils Absolute: 0 10*3/uL (ref 0.0–0.1)
EOS ABS: 0.1 10*3/uL (ref 0.0–0.5)
EOS%: 1 % (ref 0.0–7.0)
HEMATOCRIT: 33.2 % — AB (ref 34.8–46.6)
HEMOGLOBIN: 10.7 g/dL — AB (ref 11.6–15.9)
LYMPH%: 16.4 % (ref 14.0–49.7)
MCH: 26.7 pg (ref 25.1–34.0)
MCHC: 32.2 g/dL (ref 31.5–36.0)
MCV: 83.1 fL (ref 79.5–101.0)
MONO#: 0.5 10*3/uL (ref 0.1–0.9)
MONO%: 7.5 % (ref 0.0–14.0)
NEUT%: 74.4 % (ref 38.4–76.8)
NEUTROS ABS: 5 10*3/uL (ref 1.5–6.5)
PLATELETS: 293 10*3/uL (ref 145–400)
RBC: 4 10*6/uL (ref 3.70–5.45)
RDW: 18.9 % — AB (ref 11.2–14.5)
WBC: 6.7 10*3/uL (ref 3.9–10.3)
lymph#: 1.1 10*3/uL (ref 0.9–3.3)

## 2016-05-21 LAB — COMPREHENSIVE METABOLIC PANEL
ALBUMIN: 3.3 g/dL — AB (ref 3.5–5.0)
ALK PHOS: 92 U/L (ref 40–150)
ALT: 16 U/L (ref 0–55)
ANION GAP: 8 meq/L (ref 3–11)
AST: 16 U/L (ref 5–34)
BILIRUBIN TOTAL: 0.38 mg/dL (ref 0.20–1.20)
BUN: 6.5 mg/dL — ABNORMAL LOW (ref 7.0–26.0)
CALCIUM: 9.1 mg/dL (ref 8.4–10.4)
CO2: 25 mEq/L (ref 22–29)
Chloride: 108 mEq/L (ref 98–109)
Creatinine: 0.9 mg/dL (ref 0.6–1.1)
EGFR: 86 mL/min/{1.73_m2} — ABNORMAL LOW (ref >90)
GLUCOSE: 128 mg/dL (ref 70–140)
Potassium: 3.9 mEq/L (ref 3.5–5.1)
Sodium: 141 mEq/L (ref 136–145)
TOTAL PROTEIN: 7.3 g/dL (ref 6.4–8.3)

## 2016-05-21 MED ORDER — BLACK PEPPER-TURMERIC 3-500 MG PO CAPS: 1.0000 | ORAL_CAPSULE | Freq: Every day | ORAL | Status: DC

## 2016-05-21 MED ORDER — THERA VITAL M PO TABS: 1.0000 | ORAL_TABLET | Freq: Every day | ORAL | Status: AC

## 2016-05-21 MED ORDER — TAMOXIFEN CITRATE 20 MG PO TABS: 20.0000 mg | ORAL_TABLET | Freq: Every day | ORAL | 3 refills | Status: DC

## 2016-05-21 NOTE — Progress Notes (Signed)
Patient Care Team: [name], MD as PCP - General (Family Medicine) Chauncey Cruel, MD as Consulting Physician (Oncology) Thea Silversmith, MD as Consulting Physician (Radiation Oncology) Mauro Kaufmann, RN as Registered Nurse Rockwell Germany, RN as Registered Nurse Alphonsa Overall, MD as Consulting Physician (General Surgery) Sylvan Cheese, NP as Nurse Practitioner (Hematology and Oncology)  DIAGNOSIS: Breast cancer of upper-outer quadrant of right female breast [facility])   Staging form: Breast, AJCC 7th Edition   - Clinical stage from 02/08/2015: Stage IIIA (T3, N1, M0) - Unsigned   - Pathologic stage from 07/28/2015: Stage IIA (yT1c, N1a, cM0) - Unsigned  SUMMARY OF ONCOLOGIC HISTORY:   Breast cancer of upper-outer quadrant of right female breast ([place])   01/19/2015 Mammogram    Right breast: density with indistinct margin at 12:00, posterior depth; also oval mass with obscured margin at 11:00, middle depth; irregular mass with indistinct margin central to nipple, middle depth     01/23/2015 Initial Biopsy    Right breast 9:00 and 10:00 biopsy ([facility]): Grade 3 IDC ER/PR negative, HER-2 negative, Ki-67 77% and 73%; the right axillary lymph node biopsy: ER 22% positive     01/30/2015 Breast MRI    Right breast masses 9 and 10:00 position, additional irregular masses with non-mass enhancement upper outer quadrant with one mass extending into upper inner quadrant, multifocal, multicentric 13.7 x 4.6 x 4.6 cm, malignant right axillary lymph node     02/08/2015 Clinical Stage    Stage IIIA: T3, N1, M0     02/09/2015 Procedure    BreastNext panel revealed VUS BRCA2 gene called [id number] (c.3749A>G); otherwise no clinically significant variant at ATM, BARD1, BRCA1, BRCA2, BRIP1, CDH1, CHEK2, MRE11A, MUTYH, NBN, NF1, PALB2, PTEN, RAD50, RAD51C, RAD51D, and TP53     02/15/2015 Imaging    No evidence of metastatic disease, 2.2 cm enhancing lesion anterior left lower kidney suspicious for  solid renal neoplasm     02/20/2015 - 07/10/2015 Neo-Adjuvant Chemotherapy    dose dense doxorubicin and cyclophosphamide x 4 followed by Abraxane weekly x 12     07/10/2015 Breast MRI    Multiple irregular enhancing masses right breast are less confluent and smaller dominant mass 10:00 2.1 x 1.9 x 1.3 cm; right breast 11:00 mass measures 1.2 x 1.2 x 0.8 cm, decreased right axillary lymph node thickening     07/28/2015 Definitive Surgery    Rt mastectomy: Multifocal IDC 2 cm, 1.5 cm. 0.6 cm; 3/13 LN positive, ER/PR HER-2     07/28/2015 Pathologic Stage    Stage IIA: T1c N1 M0       09/03/2015 - 10/13/2015 Radiation Therapy    Adjuvant radiation therapy: Right chest wall/50.4 Pearline Cables @ 1.8 Gray per fraction x 28 fractions. 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 She completed her course of treatment and is here to discuss the subsequent treatment plan. She done quite well from the [place] sided pain. She did have fatigue. Neuropathy has improved significantly in her hands and feet. She is very  excited about breast reconstruction coming up very soon.  REVIEW OF SYSTEMS:   Constitutional: Denies fevers, chills or abnormal weight loss Eyes: Denies blurriness of vision Ears, nose, mouth, throat, and face: Denies mucositis or sore throat Respiratory: Denies cough, dyspnea or wheezes Cardiovascular: Denies palpitation, chest discomfort Gastrointestinal:  Denies nausea, heartburn or change in bowel habits Skin: Denies abnormal skin rashes Lymphatics: Denies new lymphadenopathy or easy bruising Neurological: Neuropathy has improved significantly Behavioral/Psych: Mood is stable, no new changes  Extremities: No lower extremity edema  All other systems were reviewed with the patient and are negative.  I have reviewed the past medical history, past surgical history, social history and family history with the patient and they are unchanged from previous note.  ALLERGIES:  has No Known Allergies.  MEDICATIONS:  Current Outpatient Prescriptions  Medication Sig Dispense Refill  . capecitabine (XELODA) 500 MG tablet Take 3 tablets (1,500 mg total) by mouth 2 (two) times daily after a meal. (Patient taking differently: Take 1,000 mg by mouth 2 (two) times daily after a meal. ) 112 tablet 7  . Ginger, Zingiber officinalis, (GINGER ROOT PO) Take 1 tablet by mouth daily.    Marland Kitchen HYDROcodone-acetaminophen (NORCO/VICODIN) 5-325 MG tablet Take 1-2 tablets by mouth every 6 (six) hours as needed for moderate pain. (Patient not taking: Reported on 04/22/2016) 30 tablet 0  . Probiotic Product (PROBIOTIC PO) Take 1 tablet by mouth daily.     No current facility-administered medications for this visit.     PHYSICAL EXAMINATION: ECOG PERFORMANCE STATUS: 1 - Symptomatic but completely ambulatory  Vitals:   05/21/16 0957  BP: 134/78  Pulse: 82  Resp: 18  Temp: 98.7 F (37.1 C)   Filed Weights   05/21/16 0957  Weight: 199 lb (90.3 kg)    GENERAL:alert, no distress and comfortable SKIN: skin  color, texture, turgor are normal, no rashes or significant lesions EYES: normal, Conjunctiva are pink and non-injected, sclera clear OROPHARYNX:no exudate, no erythema and lips, buccal mucosa, and tongue normal  NECK: supple, thyroid normal size, non-tender, without nodularity LYMPH:  no palpable lymphadenopathy in the cervical, axillary or inguinal LUNGS: clear to auscultation and percussion with normal breathing effort HEART: regular rate & rhythm and no murmurs and no lower extremity edema ABDOMEN:abdomen soft, non-tender and normal bowel sounds MUSCULOSKELETAL:no cyanosis of digits and no clubbing  NEURO: alert & oriented x 3 with fluent speech, no focal motor/sensory deficits EXTREMITIES: No lower extremity edema  LABORATORY DATA:  I have reviewed the data as listed   Chemistry      Component Value Date/Time   NA 138 04/23/2016 1551   NA 138 03/20/2016 1054   K 3.2 (L) 04/23/2016 1551   K 3.4 (L) 03/20/2016 1054   CL 104 04/23/2016 1551   CO2 28 04/23/2016 1551   CO2 28 03/20/2016 1054   BUN <5 (L) 04/23/2016 1551   BUN 8.7 03/20/2016 1054   CREATININE 0.82 04/23/2016  1551   CREATININE 0.8 03/20/2016 1054      Component Value Date/Time   CALCIUM 8.8 (L) 04/23/2016 1551   CALCIUM 8.9 03/20/2016 1054   ALKPHOS 98 03/20/2016 1054   AST 22 03/20/2016 1054   ALT 20 03/20/2016 1054   BILITOT 0.49 03/20/2016 1054       Lab Results  Component Value Date   WBC 6.7 05/21/2016   HGB 10.7 (L) 05/21/2016   HCT 33.2 (L) 05/21/2016   MCV 83.1 05/21/2016   PLT 293 05/21/2016   NEUTROABS 5.0 05/21/2016     ASSESSMENT & PLAN:  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 0.6 cm; 3/13 LN positive, T1c N1 M0 stage IIA, ER/PR HER-2 negative. Adjuvant XRT 09/03/15 to 10/13/15  Treatment Plan: Adjuvant Xeloda X 6 months (8 cycles) started 11/15/2015 completed July 2017 Xeloda toxicities: 1. Diarrhea 2. Mild hand-foot syndrome I decreased the dosage of Xeloda to 1000 and evidence by mouth twice a day    Severe peripheral neuropathy: Resolved in the feet much improved in the hands Weight gain: Patient is struggling with this more than anything else. Mandible biopsy: Scant material, no malignancy  Because the lymph node had 22% ER/PR positivity, I recommended starting her on tamoxifen 20 mg daily. Tamoxifen counseling: We discussed the risks and benefits of tamoxifen. These include but not limited to insomnia, hot flashes, mood changes, vaginal dryness, and weight gain. Although rare, serious side effects including endometrial cancer, risk of blood clots were also discussed. We strongly believe that the benefits far outweigh the risks. Patient understands these risks and consented to starting treatment. Planned treatment duration is 5 years.  Return to clinic in 3 months for toxicity evaluation on tamoxifen. If she is not able to tolerate it then we will discontinue treatment.  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 No role for adjuvant therapy   No orders of the defined types were placed in this encounter.  The patient has a good understanding of the overall plan. she agrees with it. she will call with any problems that may develop before the next visit here.   Rulon Eisenmenger, MD 05/21/16

## 2016-05-21 NOTE — Assessment & Plan Note (Signed)
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 No role for adjuvant therapy

## 2016-05-21 NOTE — Telephone Encounter (Signed)
appt made and avs printed °

## 2016-05-21 NOTE — Assessment & Plan Note (Signed)
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 0.6 cm; 3/13 LN positive, T1c N1 M0 stage IIA, ER/PR HER-2 negative. Adjuvant XRT 09/03/15 to 10/13/15  Treatment Plan: Adjuvant Xeloda X 6 months (8 cycles) started 11/15/2015 completed July 2017 Xeloda toxicities: 1. Diarrhea 2. Mild hand-foot syndrome I decreased the dosage of Xeloda to 1000 and evidence by mouth twice a day    Severe peripheral neuropathy: this is markedly better than before. She still has numbness in the fingers and toes. Weight gain: Patient is struggling with this more than anything else. Mandible biopsy: Scant material, no malignancy Return to clinic in 6 months for follow-up

## 2016-05-29 ENCOUNTER — Encounter (HOSPITAL_COMMUNITY): Payer: Self-pay

## 2016-05-29 ENCOUNTER — Other Ambulatory Visit: Payer: Self-pay

## 2016-05-29 ENCOUNTER — Encounter (HOSPITAL_COMMUNITY)
Admission: RE | Admit: 2016-05-29 | Discharge: 2016-05-29 | Disposition: A | Discharge status: Home / Self Care | Payer: 59 | Source: Ambulatory Visit | Attending: Plastic Surgery | Admitting: Plastic Surgery

## 2016-05-29 DIAGNOSIS — Z01812 Encounter for preprocedural laboratory examination: Secondary | ICD-10-CM | POA: Diagnosis not present

## 2016-05-29 DIAGNOSIS — Z01818 Encounter for other preprocedural examination: Secondary | ICD-10-CM | POA: Diagnosis not present

## 2016-05-29 HISTORY — DX: Personal history of other malignant neoplasm of kidney: Z85.528

## 2016-05-29 HISTORY — DX: Lymphedema, not elsewhere classified: I89.0

## 2016-05-29 LAB — HCG, SERUM, QUALITATIVE: Preg, Serum: NEGATIVE

## 2016-05-29 NOTE — Pre-Procedure Instructions (Addendum)
Phyllis Pruitt  05/29/2016      [id number] [name], [place] [address] Phone: [phone] Fax: [phone]  [facility] [place], [place] - [address] AT [address] [address] Phone: [phone] Fax: [phone]    Your procedure is scheduled on 06/03/16.  Report to [facility] Admitting at 530 A.M.  Call this number if you have problems the morning of surgery:  [phone]   Remember:  Do not eat food or drink liquids after midnight.  Take these medicines the morning of surgery with A SIP OF WATER   STOP all herbel meds, nsaids (aleve,naproxen,advil,ibuprofen) 5 days prior to surgery starting today 05/29/16 including all vitamins(tumeric, ginger,probiotic, multi)   Do not wear jewelry, make-up or nail polish.  Do not wear lotions, powders, or perfumes.  You may wear deoderant.  Do not shave 48 hours prior to surgery.  Men may shave face and neck.  Do not bring valuables to the hospital.  [facility] is not responsible for any belongings or valuables.  Contacts, dentures or bridgework may not be worn into surgery.  Leave your suitcase in the car.  After surgery it may be brought to your room.  For patients admitted to the hospital, discharge time will be determined by your treatment team.  Patients discharged the day of surgery will not be allowed to drive home.   Name and phone number of your driver:    Special instructions: Special Instructions: [place] - Preparing for Surgery  Before surgery, you can play an important role.  Because skin is not sterile, your skin needs to be as free of germs as possible.  You can reduce the number of germs on you skin by washing with CHG (chlorahexidine gluconate) soap before surgery.  CHG is an antiseptic cleaner which kills germs and bonds with the skin to continue killing germs  even after washing.  Please DO NOT use if you have an allergy to CHG or antibacterial soaps.  If your skin becomes reddened/irritated stop using the CHG and inform your nurse when you arrive at Short Stay.  Do not shave (including legs and underarms) for at least 48 hours prior to the first CHG shower.  You may shave your face.  Please follow these instructions carefully:   1.  Shower with CHG Soap the night before surgery and the morning of Surgery.  2.  If you choose to wash your hair, wash your hair first as usual with your normal shampoo.  3.  After you shampoo, rinse your hair and body thoroughly to remove the Shampoo.  4.  Use CHG as you would any other liquid soap.  You can apply chg directly  to the skin and wash gently with scrungie or a clean washcloth.  5.  Apply the CHG Soap to your body ONLY FROM THE NECK DOWN.  Do not use on open wounds or open sores.  Avoid contact with your eyes ears, mouth and genitals (private parts).  Wash genitals (private parts)       with your normal soap.  6.  Wash thoroughly, paying special attention to the area where your surgery will be performed.  7.  Thoroughly rinse your body with warm water from the neck down.  8.  DO NOT shower/wash with your normal soap after using and rinsing off  the CHG Soap.  9.  Pat yourself dry with a clean towel.            10.  Wear clean pajamas.            11.  Place clean sheets on your bed the night of your first shower and do not sleep with pets.  Day of Surgery  Do not apply any lotions/deodorants the morning of surgery.  Please wear clean clothes to the hospital/surgery center.  Please read over the fact sheets that you were given.

## 2016-05-30 NOTE — H&P (Signed)
  Subjective:    Patient ID: Phyllis Pruitt is a 45 y.o. female.  HPI  Presents for follow up discussion breast reconstruction. At last visit, had frank discussion with patient that I felt she would have problems with abdominal based reconstruction both flap and abdominal wall due to weight. She decided to put off procedure few weeks to see if she could lose wt and this remains within 1-2 lbs. We have had multiple email discussion regarding flap vs implant based surgery. She remains committed to having surgery this month. She returns with husband and we agree to proceed with latissimus flap with expander placement.   Presented with palpable lump in the right breast. MMG revealed multiple masses in the right breast. US revealed right axillary lymph node. Biopsy of the 9:00 and 10:00 position masses in addition to the right axillary lymph node biopsy area all with IDC, ER/PR and HER-2 - for the primary tumor, but ER +for the axillary lymph node. MRI showed multifocal, multicentric disease with at least 8 tumors, 2.3 cm, 1.6 cm of the biggest tumors in addition 6 more tumors 1 cm or less spanning 13.7 cm. CT scan showed 1.8 x 1.8 x 2.2 cm lesion anterior left lower kidney suspicious for renal neoplasm otherwise no evidence of metastatic disease. Completed neoadjuvant chemotherapy, followed by mastectomy with ALND. Final pathology with multicentric ca, 2 cm, 1.5 cm. 0.6 cm; 3/13 LN positive, T1c N1 M0 stage IIA. Radiation completed 12.30.16. On maintenance Xeloda.   Genetics testing negative with exception BRCA2 VUS.  Also diagnosed with renal mass and underwent excision with Clear cell carcinoma diagnosed, confined to kidney and no further treatment recommended. Diagnosed as part of staging studies done for breast cancer. Procedure done robotic assisted.  Prior 87 D happy with this size.  Wt up overall 25-30 lb wt gain since diagnosis.   Review of Systems     Objective:   Physical Exam   Cardiovascular: Normal rate and regular rhythm.   Pulmonary/Chest: Effort normal and breath sounds normal.  Abdominal: Soft.  +pannilculus, no hernias  Skin:  Fitzpatrick 6  port scars umbilicus and left abdomen, striae present., left mid abdomen with few cm scar Grade 2 ptosis left  SN to nipple L 30 cm BW R 14   L 22 cm Nipple to IMF  L 11 cm    Assessment:     Right breast multifocal, multicentric cancer T3N1 Neoadjuvant chemotherapy  S/p mastectomy, ALND, and adjuvant radiation    Plan:     Given XRT recommend some type of autologous reconstruction either abdominal based or LD/TE.  Reviewed latissimus with expander. Counseled that this would prevent the abdominal based complications, that skin paddle over latissimus generally does well, very reliable. Reviewed risk seroma back, back scar. Reviewed process of expansion and additional surgery for placement implant. Reviewed implant specific risks including rupture, infection requiring removal or replacement, MRI surveillance of silicone implants, contracture. Counseled unilateral implant reconstruction will generally have more upper pole fulness on implant side even with opposite breast lift, that left breast will develop ptosis, change volume with agiing, wt gain loss. Reviewed examples of implants and expanders. We discussed timing of future surgery, recovery for future surgery. She expressed concern about loss of nipple feeling on left with reduction, counseled that complete loss feeling is rare but some diminished sensation is normal early post op and may be permanent.   Irene Limbo, MD [facility] Plastic & Reconstructive Surgery [phone], pin [phone]

## 2016-06-02 MED ORDER — HEPARIN SODIUM (PORCINE) 5000 UNIT/ML IJ SOLN
5000.0000 [IU] | INTRAMUSCULAR | Status: AC
  Administered 2016-06-03: 5000 [IU] via SUBCUTANEOUS

## 2016-06-02 MED ORDER — CEFAZOLIN SODIUM-DEXTROSE 2-4 GM/100ML-% IV SOLN
2.0000 g | INTRAVENOUS | Status: AC
  Administered 2016-06-03: 2 g via INTRAVENOUS

## 2016-06-03 ENCOUNTER — Inpatient Hospital Stay (HOSPITAL_COMMUNITY): Payer: 59 | Admitting: Emergency Medicine

## 2016-06-03 ENCOUNTER — Inpatient Hospital Stay (HOSPITAL_COMMUNITY): Payer: 59

## 2016-06-03 ENCOUNTER — Inpatient Hospital Stay (HOSPITAL_COMMUNITY)
Admission: RE | Admit: 2016-06-03 | Discharge: 2016-06-04 | DRG: 941 | Disposition: A | Discharge status: Home / Self Care | Payer: 59 | Source: Ambulatory Visit | Attending: Plastic Surgery | Admitting: Plastic Surgery

## 2016-06-03 ENCOUNTER — Encounter (HOSPITAL_COMMUNITY)
Admission: RE | Disposition: A | Discharge status: Home / Self Care | Payer: Self-pay | Source: Ambulatory Visit | Attending: Plastic Surgery

## 2016-06-03 ENCOUNTER — Encounter (HOSPITAL_COMMUNITY): Payer: Self-pay | Admitting: *Deleted

## 2016-06-03 ENCOUNTER — Inpatient Hospital Stay (HOSPITAL_COMMUNITY): Payer: 59 | Admitting: Certified Registered Nurse Anesthetist

## 2016-06-03 DIAGNOSIS — Z923 Personal history of irradiation: Secondary | ICD-10-CM | POA: Diagnosis not present

## 2016-06-03 DIAGNOSIS — Z853 Personal history of malignant neoplasm of breast: Secondary | ICD-10-CM

## 2016-06-03 DIAGNOSIS — Z9010 Acquired absence of unspecified breast and nipple: Secondary | ICD-10-CM

## 2016-06-03 DIAGNOSIS — Z419 Encounter for procedure for purposes other than remedying health state, unspecified: Secondary | ICD-10-CM

## 2016-06-03 DIAGNOSIS — Z9011 Acquired absence of right breast and nipple: Secondary | ICD-10-CM | POA: Diagnosis not present

## 2016-06-03 HISTORY — PX: TISSUE EXPANDER PLACEMENT: SHX2530

## 2016-06-03 HISTORY — PX: LATISSIMUS FLAP TO BREAST: SHX5357

## 2016-06-03 SURGERY — RECONSTRUCTION, BREAST, USING LATISSIMUS DORSI MYOCUTANEOUS FLAP
Anesthesia: General | Site: Chest | Laterality: Right

## 2016-06-03 MED ORDER — ONDANSETRON HCL 4 MG/2ML IJ SOLN
INTRAMUSCULAR | Status: DC | PRN
  Administered 2016-06-03: 4 mg via INTRAVENOUS

## 2016-06-03 MED ORDER — FENTANYL CITRATE (PF) 100 MCG/2ML IJ SOLN
INTRAMUSCULAR | Status: AC
Start: 2016-06-03 — End: 2016-06-03
  Filled 2016-06-03: qty 6

## 2016-06-03 MED ORDER — HYDROMORPHONE HCL 1 MG/ML IJ SOLN: 0.5000 mg | INTRAMUSCULAR | Status: DC | PRN

## 2016-06-03 MED ORDER — ONDANSETRON HCL 4 MG/2ML IJ SOLN: 4.0000 mg | Freq: Once | INTRAMUSCULAR | Status: DC | PRN

## 2016-06-03 MED ORDER — HYDROMORPHONE HCL 1 MG/ML IJ SOLN
INTRAMUSCULAR | Status: AC
  Filled 2016-06-03: qty 1

## 2016-06-03 MED ORDER — SODIUM CHLORIDE 0.9 % IJ SOLN
INTRAMUSCULAR | Status: DC | PRN
  Administered 2016-06-03: 20 mL

## 2016-06-03 MED ORDER — HYDROCODONE-ACETAMINOPHEN 5-325 MG PO TABS: 1.0000 | ORAL_TABLET | ORAL | Status: DC | PRN

## 2016-06-03 MED ORDER — ONDANSETRON HCL 4 MG/2ML IJ SOLN
INTRAMUSCULAR | Status: AC
  Filled 2016-06-03: qty 2

## 2016-06-03 MED ORDER — HYDROMORPHONE HCL 1 MG/ML IJ SOLN: 0.2500 mg | INTRAMUSCULAR | Status: DC | PRN

## 2016-06-03 MED ORDER — CHLORHEXIDINE GLUCONATE CLOTH 2 % EX PADS: 6.0000 | MEDICATED_PAD | Freq: Once | CUTANEOUS | Status: DC

## 2016-06-03 MED ORDER — ARTIFICIAL TEARS OP OINT
TOPICAL_OINTMENT | OPHTHALMIC | Status: DC | PRN
  Administered 2016-06-03: 1 via OPHTHALMIC

## 2016-06-03 MED ORDER — BUPIVACAINE LIPOSOME 1.3 % IJ SUSP
INTRAMUSCULAR | Status: DC | PRN
  Administered 2016-06-03: 20 mL

## 2016-06-03 MED ORDER — HYDROMORPHONE HCL 1 MG/ML IJ SOLN
INTRAMUSCULAR | Status: DC | PRN
  Administered 2016-06-03: 0.5 mg via INTRAVENOUS
  Administered 2016-06-03: .25 mg via INTRAVENOUS
  Administered 2016-06-03: 0.5 mg via INTRAVENOUS
  Administered 2016-06-03 (×3): .25 mg via INTRAVENOUS

## 2016-06-03 MED ORDER — SODIUM CHLORIDE 0.9 % IV SOLN
INTRAVENOUS | Status: DC
  Filled 2016-06-03: qty 1

## 2016-06-03 MED ORDER — METHOCARBAMOL 500 MG PO TABS
500.0000 mg | ORAL_TABLET | Freq: Three times a day (TID) | ORAL | Status: DC | PRN
  Administered 2016-06-03: 500 mg via ORAL
  Filled 2016-06-03 (×2): qty 1

## 2016-06-03 MED ORDER — SUGAMMADEX SODIUM 200 MG/2ML IV SOLN
INTRAVENOUS | Status: DC | PRN
  Administered 2016-06-03: 200 mg via INTRAVENOUS

## 2016-06-03 MED ORDER — CEFAZOLIN IN D5W 1 GM/50ML IV SOLN
1.0000 g | Freq: Three times a day (TID) | INTRAVENOUS | Status: DC
  Administered 2016-06-03 – 2016-06-04 (×3): 1 g via INTRAVENOUS
  Filled 2016-06-03 (×4): qty 50

## 2016-06-03 MED ORDER — HEPARIN SODIUM (PORCINE) 5000 UNIT/ML IJ SOLN
INTRAMUSCULAR | Status: AC
  Filled 2016-06-03: qty 1

## 2016-06-03 MED ORDER — MEPERIDINE HCL 25 MG/ML IJ SOLN: 6.2500 mg | INTRAMUSCULAR | Status: DC | PRN

## 2016-06-03 MED ORDER — SODIUM CHLORIDE 0.9 % IV SOLN
INTRAVENOUS | Status: DC | PRN
  Administered 2016-06-03: 1000 mL

## 2016-06-03 MED ORDER — FENTANYL CITRATE (PF) 100 MCG/2ML IJ SOLN
INTRAMUSCULAR | Status: DC | PRN
  Administered 2016-06-03 (×2): 50 ug via INTRAVENOUS
  Administered 2016-06-03 (×2): 100 ug via INTRAVENOUS

## 2016-06-03 MED ORDER — KCL IN DEXTROSE-NACL 20-5-0.45 MEQ/L-%-% IV SOLN
INTRAVENOUS | Status: DC
  Administered 2016-06-03 – 2016-06-04 (×2): via INTRAVENOUS
  Filled 2016-06-03 (×2): qty 1000

## 2016-06-03 MED ORDER — MIDAZOLAM HCL 2 MG/2ML IJ SOLN
INTRAMUSCULAR | Status: AC
  Filled 2016-06-03: qty 2

## 2016-06-03 MED ORDER — ONDANSETRON 4 MG PO TBDP: 4.0000 mg | ORAL_TABLET | Freq: Four times a day (QID) | ORAL | Status: DC | PRN

## 2016-06-03 MED ORDER — 0.9 % SODIUM CHLORIDE (POUR BTL) OPTIME
TOPICAL | Status: DC | PRN
  Administered 2016-06-03: 2000 mL

## 2016-06-03 MED ORDER — LIDOCAINE HCL (CARDIAC) 20 MG/ML IV SOLN
INTRAVENOUS | Status: DC | PRN
Start: 2016-06-03 — End: 2016-06-03
  Administered 2016-06-03: 100 mg via INTRAVENOUS

## 2016-06-03 MED ORDER — LABETALOL HCL 5 MG/ML IV SOLN
INTRAVENOUS | Status: DC | PRN
  Administered 2016-06-03: 5 mg via INTRAVENOUS

## 2016-06-03 MED ORDER — SENNOSIDES-DOCUSATE SODIUM 8.6-50 MG PO TABS: 1.0000 | ORAL_TABLET | Freq: Every evening | ORAL | Status: DC | PRN

## 2016-06-03 MED ORDER — MIDAZOLAM HCL 5 MG/5ML IJ SOLN
INTRAMUSCULAR | Status: DC | PRN
  Administered 2016-06-03: 2 mg via INTRAVENOUS

## 2016-06-03 MED ORDER — DEXAMETHASONE SODIUM PHOSPHATE 10 MG/ML IJ SOLN
INTRAMUSCULAR | Status: DC | PRN
  Administered 2016-06-03: 10 mg via INTRAVENOUS

## 2016-06-03 MED ORDER — ROCURONIUM BROMIDE 100 MG/10ML IV SOLN
INTRAVENOUS | Status: DC | PRN
  Administered 2016-06-03: 5 mg via INTRAVENOUS
  Administered 2016-06-03: 10 mg via INTRAVENOUS
  Administered 2016-06-03: 50 mg via INTRAVENOUS
  Administered 2016-06-03: 10 mg via INTRAVENOUS
  Administered 2016-06-03 (×2): 5 mg via INTRAVENOUS

## 2016-06-03 MED ORDER — PROPOFOL 10 MG/ML IV BOLUS
INTRAVENOUS | Status: DC | PRN
  Administered 2016-06-03: 120 mg via INTRAVENOUS

## 2016-06-03 MED ORDER — KETOROLAC TROMETHAMINE 30 MG/ML IJ SOLN
30.0000 mg | Freq: Three times a day (TID) | INTRAMUSCULAR | Status: AC
  Administered 2016-06-03 – 2016-06-04 (×3): 30 mg via INTRAVENOUS
  Filled 2016-06-03 (×3): qty 1

## 2016-06-03 MED ORDER — ONDANSETRON HCL 4 MG/2ML IJ SOLN: 4.0000 mg | Freq: Four times a day (QID) | INTRAMUSCULAR | Status: DC | PRN

## 2016-06-03 MED ORDER — ENOXAPARIN SODIUM 40 MG/0.4ML ~~LOC~~ SOLN
40.0000 mg | SUBCUTANEOUS | Status: DC
  Filled 2016-06-03: qty 0.4

## 2016-06-03 MED ORDER — PROPOFOL 10 MG/ML IV BOLUS
INTRAVENOUS | Status: AC
  Filled 2016-06-03: qty 20

## 2016-06-03 MED ORDER — LACTATED RINGERS IV SOLN
INTRAVENOUS | Status: DC | PRN
  Administered 2016-06-03: 07:00:00 via INTRAVENOUS

## 2016-06-03 MED ORDER — BUPIVACAINE LIPOSOME 1.3 % IJ SUSP
20.0000 mL | INTRAMUSCULAR | Status: DC
  Filled 2016-06-03: qty 20

## 2016-06-03 SURGICAL SUPPLY — 100 items
ADH SKN CLS APL DERMABOND .7 (GAUZE/BANDAGES/DRESSINGS) ×2
APPLIER CLIP 9.375 MED OPEN (MISCELLANEOUS) ×3
APR CLP MED 9.3 20 MLT OPN (MISCELLANEOUS) ×1
BAG DECANTER FOR FLEXI CONT (MISCELLANEOUS) ×3 IMPLANT
BINDER BREAST LRG (GAUZE/BANDAGES/DRESSINGS) IMPLANT
BINDER BREAST XLRG (GAUZE/BANDAGES/DRESSINGS) ×2 IMPLANT
BLADE 10 SAFETY STRL DISP (BLADE) ×3 IMPLANT
BLADE SURG 10 STRL SS (BLADE) ×5 IMPLANT
BLADE SURG 15 STRL LF DISP TIS (BLADE) ×1 IMPLANT
BLADE SURG 15 STRL SS (BLADE) ×6
BNDG COHESIVE 4X5 TAN STRL (GAUZE/BANDAGES/DRESSINGS) ×2 IMPLANT
CANISTER SUCTION 2500CC (MISCELLANEOUS) ×3 IMPLANT
CATH FOLEY 2WAY  3CC 10FR (CATHETERS) ×2
CATH FOLEY 2WAY 3CC 10FR (CATHETERS) IMPLANT
CATH FOLEY 2WAY SLVR  5CC 12FR (CATHETERS) ×2
CATH FOLEY 2WAY SLVR 5CC 12FR (CATHETERS) IMPLANT
CHLORAPREP W/TINT 26ML (MISCELLANEOUS) ×5 IMPLANT
CLIP APPLIE 9.375 MED OPEN (MISCELLANEOUS) ×1 IMPLANT
CLOSURE WOUND 1/2 X4 (GAUZE/BANDAGES/DRESSINGS) ×2
CONT SPEC 4OZ CLIKSEAL STRL BL (MISCELLANEOUS) ×2 IMPLANT
COVER MAYO STAND STRL (DRAPES) ×2 IMPLANT
COVER SURGICAL LIGHT HANDLE (MISCELLANEOUS) ×3 IMPLANT
DERMABOND ADVANCED (GAUZE/BANDAGES/DRESSINGS) ×4
DERMABOND ADVANCED .7 DNX12 (GAUZE/BANDAGES/DRESSINGS) ×1 IMPLANT
DRAIN CHANNEL 15F RND FF W/TCR (WOUND CARE) ×3 IMPLANT
DRAIN CHANNEL 19F RND (DRAIN) ×6 IMPLANT
DRAPE INCISE 23X17 IOBAN STRL (DRAPES)
DRAPE INCISE 23X17 STRL (DRAPES) IMPLANT
DRAPE INCISE IOBAN 23X17 STRL (DRAPES) IMPLANT
DRAPE INCISE IOBAN 85X60 (DRAPES) ×3 IMPLANT
DRAPE ORTHO SPLIT 77X108 STRL (DRAPES) ×6
DRAPE PROXIMA HALF (DRAPES) ×6 IMPLANT
DRAPE SURG ORHT 6 SPLT 77X108 (DRAPES) ×2 IMPLANT
DRAPE WARM FLUID 44X44 (DRAPE) ×3 IMPLANT
DRSG MEPILEX BORDER 4X8 (GAUZE/BANDAGES/DRESSINGS) ×3 IMPLANT
DRSG PAD ABDOMINAL 8X10 ST (GAUZE/BANDAGES/DRESSINGS) ×4 IMPLANT
ELECT BLADE 4.0 EZ CLEAN MEGAD (MISCELLANEOUS) ×3
ELECT BLADE 6.5 EXT (BLADE) ×5 IMPLANT
ELECT COATED BLADE 2.86 ST (ELECTRODE) ×7 IMPLANT
ELECT REM PT RETURN 9FT ADLT (ELECTROSURGICAL) ×3
ELECTRODE BLDE 4.0 EZ CLN MEGD (MISCELLANEOUS) ×1 IMPLANT
ELECTRODE REM PT RTRN 9FT ADLT (ELECTROSURGICAL) ×1 IMPLANT
EVACUATOR SILICONE 100CC (DRAIN) ×6 IMPLANT
GAUZE SPONGE 4X4 12PLY STRL (GAUZE/BANDAGES/DRESSINGS) ×3 IMPLANT
GAUZE XEROFORM 5X9 LF (GAUZE/BANDAGES/DRESSINGS) ×3 IMPLANT
GLOVE BIO SURGEON STRL SZ 6 (GLOVE) ×17 IMPLANT
GLOVE BIO SURGEON STRL SZ7.5 (GLOVE) ×2 IMPLANT
GLOVE BIOGEL PI IND STRL 7.0 (GLOVE) IMPLANT
GLOVE BIOGEL PI IND STRL 7.5 (GLOVE) IMPLANT
GLOVE BIOGEL PI INDICATOR 7.0 (GLOVE) ×2
GLOVE BIOGEL PI INDICATOR 7.5 (GLOVE) ×4
GLOVE ECLIPSE 7.5 STRL STRAW (GLOVE) ×2 IMPLANT
GLOVE SURG SS PI 6.0 STRL IVOR (GLOVE) ×3 IMPLANT
GLOVE SURG SS PI 6.5 STRL IVOR (GLOVE) ×4 IMPLANT
GLOVE SURG SS PI 7.0 STRL IVOR (GLOVE) ×2 IMPLANT
GLOVE SURG SS PI 7.5 STRL IVOR (GLOVE) ×2 IMPLANT
GOWN STRL REUS W/ TWL LRG LVL3 (GOWN DISPOSABLE) ×2 IMPLANT
GOWN STRL REUS W/TWL LRG LVL3 (GOWN DISPOSABLE) ×9
KIT BASIN OR (CUSTOM PROCEDURE TRAY) ×3 IMPLANT
KIT FILL SYSTEM UNIVERSAL (SET/KITS/TRAYS/PACK) ×2 IMPLANT
KIT ROOM TURNOVER OR (KITS) ×3 IMPLANT
LIQUID BAND (GAUZE/BANDAGES/DRESSINGS) ×6 IMPLANT
NDL 21 GA WING INFUSION (NEEDLE) IMPLANT
NEEDLE 21 GA WING INFUSION (NEEDLE) ×3 IMPLANT
NEEDLE 22X1 1/2 (OR ONLY) (NEEDLE) ×3 IMPLANT
NS IRRIG 1000ML POUR BTL (IV SOLUTION) ×6 IMPLANT
PACK GENERAL/GYN (CUSTOM PROCEDURE TRAY) ×3 IMPLANT
PAD ARMBOARD 7.5X6 YLW CONV (MISCELLANEOUS) ×9 IMPLANT
PEN SKIN MARKING BROAD (MISCELLANEOUS) ×3 IMPLANT
PENCIL BUTTON HOLSTER BLD 10FT (ELECTRODE) ×2 IMPLANT
PIN SAFETY STERILE (MISCELLANEOUS) ×3 IMPLANT
SET COLLECT BLD 21X3/4 12 PB (MISCELLANEOUS) IMPLANT
SOLUTION BETADINE 4OZ (MISCELLANEOUS) IMPLANT
SPONGE GAUZE 4X4 12PLY STER LF (GAUZE/BANDAGES/DRESSINGS) ×6 IMPLANT
SPONGE LAP 18X18 X RAY DECT (DISPOSABLE) ×6 IMPLANT
STAPLER VISISTAT 35W (STAPLE) ×3 IMPLANT
STOCKINETTE IMPERVIOUS 9X36 MD (GAUZE/BANDAGES/DRESSINGS) ×2 IMPLANT
STRIP CLOSURE SKIN 1/2X4 (GAUZE/BANDAGES/DRESSINGS) ×4 IMPLANT
SUT DVC VLOC 180 0 12IN GS21 (SUTURE) ×3
SUT ETHILON 2 0 FS 18 (SUTURE) ×8 IMPLANT
SUT MNCRL AB 3-0 PS2 18 (SUTURE) ×8 IMPLANT
SUT MNCRL AB 4-0 PS2 18 (SUTURE) ×10 IMPLANT
SUT MON AB 5-0 PS2 18 (SUTURE) ×6 IMPLANT
SUT PDS AB 2-0 CT1 27 (SUTURE) ×16 IMPLANT
SUT VIC AB 3-0 PS2 18 (SUTURE) ×12
SUT VIC AB 3-0 PS2 18XBRD (SUTURE) ×4 IMPLANT
SUT VIC AB 3-0 SH 8-18 (SUTURE) ×3 IMPLANT
SUT VIC AB 4-0 PS2 27 (SUTURE) ×3 IMPLANT
SUT VICRYL 4-0 PS2 18IN ABS (SUTURE) IMPLANT
SUT VLOC 180 0 24IN GS25 (SUTURE) IMPLANT
SUTURE DVC VLC 180 0 12IN GS21 (SUTURE) IMPLANT
SYR 50ML SLIP (SYRINGE) IMPLANT
SYR BULB IRRIGATION 50ML (SYRINGE) ×3 IMPLANT
SYR CONTROL 10ML LL (SYRINGE) ×3 IMPLANT
TISSUE EXPANDER MX 500CC (Prosthesis & Implant Plastic) ×2 IMPLANT
TOWEL OR 17X24 6PK STRL BLUE (TOWEL DISPOSABLE) ×3 IMPLANT
TOWEL OR 17X26 10 PK STRL BLUE (TOWEL DISPOSABLE) ×3 IMPLANT
TRAY FOLEY CATH 14FRSI W/METER (CATHETERS) ×3 IMPLANT
TUBE CONNECTING 12'X1/4 (SUCTIONS) ×3
TUBE CONNECTING 12X1/4 (SUCTIONS) ×4 IMPLANT

## 2016-06-03 NOTE — Transfer of Care (Signed)
Immediate Anesthesia Transfer of Care Note  Patient: Phyllis Pruitt  Procedure(s) Performed: Procedure(s): RIGHT LATISSIMUS FLAP TO BREAST (Right) PLACEMENT RIGHT TISSUE EXPANDER (Right)  Patient Location: PACU  Anesthesia Type:General  Level of Consciousness: awake, alert  and oriented  Airway & Oxygen Therapy: Patient Spontanous Breathing and Patient connected to nasal cannula oxygen  Post-op Assessment: Report given to RN, Post -op Vital signs reviewed and stable and Patient moving all extremities  Post vital signs: Reviewed and stable  Last Vitals:  Vitals:   06/03/16 0611  BP: 140/68  Pulse: 82  Resp: 18  Temp: 36.7 C    Last Pain:  Vitals:   06/03/16 0611  TempSrc: Oral         Complications: No apparent anesthesia complications

## 2016-06-03 NOTE — Anesthesia Procedure Notes (Signed)
Procedure Name: Intubation Date/Time: 06/03/2016 7:52 AM Performed by: Moshe Cipro Shane Melby ANN Pre-anesthesia Checklist: Patient identified, Emergency Drugs available, Suction available and Patient being monitored Patient Re-evaluated:Patient Re-evaluated prior to inductionOxygen Delivery Method: Circle system utilized Preoxygenation: Pre-oxygenation with 100% oxygen Intubation Type: IV induction Ventilation: Mask ventilation without difficulty Laryngoscope Size: Mac and 3 Grade View: Grade I Tube type: Oral Tube size: 7.0 mm Number of attempts: 1 Placement Confirmation: ETT inserted through vocal cords under direct vision,  positive ETCO2 and breath sounds checked- equal and bilateral Secured at: 21 cm Tube secured with: Tape Dental Injury: Teeth and Oropharynx as per pre-operative assessment

## 2016-06-03 NOTE — Anesthesia Postprocedure Evaluation (Signed)
Anesthesia Post Note  Patient: ANABRENDA FELMLEE  Procedure(s) Performed: Procedure(s) (LRB): RIGHT LATISSIMUS FLAP TO BREAST (Right) PLACEMENT RIGHT TISSUE EXPANDER (Right)  Patient location during evaluation: PACU Anesthesia Type: General Level of consciousness: awake and alert Pain management: pain level controlled Vital Signs Assessment: post-procedure vital signs reviewed and stable Respiratory status: spontaneous breathing, nonlabored ventilation, respiratory function stable and patient connected to nasal cannula oxygen Cardiovascular status: blood pressure returned to baseline and stable Postop Assessment: no signs of nausea or vomiting Anesthetic complications: no    Last Vitals:  Vitals:   06/03/16 1231 06/03/16 1300  BP: (!) 141/88   Pulse: 76 83  Resp: 15 18  Temp:  36.5 C    Last Pain:  Vitals:   06/03/16 1230  TempSrc:   PainSc: 0-No pain                 Dora Simeone DAVID

## 2016-06-03 NOTE — Brief Op Note (Signed)
Radiologist Fidela Salisbury M.D verified radiograph. Does not appear to be any foreign bodies in pt.

## 2016-06-03 NOTE — Anesthesia Preprocedure Evaluation (Signed)
Anesthesia Evaluation  Patient identified by MRN, date of birth, ID band Patient awake    Reviewed: Allergy & Precautions, NPO status , Patient's Chart, lab work & pertinent test results  Airway Mallampati: I  TM Distance: >3 FB Neck ROM: Full    Dental   Pulmonary    Pulmonary exam normal        Cardiovascular Normal cardiovascular exam     Neuro/Psych    GI/Hepatic   Endo/Other    Renal/GU      Musculoskeletal   Abdominal   Peds  Hematology   Anesthesia Other Findings   Reproductive/Obstetrics                             Anesthesia Physical Anesthesia Plan  ASA: III  Anesthesia Plan: General   Post-op Pain Management:    Induction: Intravenous  Airway Management Planned: Oral ETT  Additional Equipment:   Intra-op Plan:   Post-operative Plan: Extubation in OR  Informed Consent: I have reviewed the patients History and Physical, chart, labs and discussed the procedure including the risks, benefits and alternatives for the proposed anesthesia with the patient or authorized representative who has indicated his/her understanding and acceptance.     Plan Discussed with: CRNA and Surgeon  Anesthesia Plan Comments:         Anesthesia Quick Evaluation  

## 2016-06-03 NOTE — Interval H&P Note (Signed)
History and Physical Interval Note:  06/03/2016 7:10 AM  Phyllis Pruitt  has presented today for surgery, with the diagnosis of HISTORY OF BREAST CANCER, ACQUIRED ABSENCE OF RIGHT BREAST, HISTORY OF THERAPEUTIC RADIATION  The various methods of treatment have been discussed with the patient and family. After consideration of risks, benefits and other options for treatment, the patient has consented to  Procedure(s): RIGHT LATISSIMUS FLAP TO BREAST (Right) PLACEMENT RIGHT TISSUE EXPANDER (Right) as a surgical intervention .  The patient's history has been reviewed, patient examined, no change in status, stable for surgery.  I have reviewed the patient's chart and labs.  Questions were answered to the patient's satisfaction.     [name]

## 2016-06-03 NOTE — Op Note (Signed)
Operative Note   DATE OF OPERATION: 8.21.17  LOCATION: [facility]-inpatient  SURGICAL DIVISION: Plastic Surgery  PREOPERATIVE DIAGNOSES:  1. History breast cancer 2. Acquired absence breast 3. History therapeutic radiation  POSTOPERATIVE DIAGNOSES:  same  PROCEDURE:  1. Right latissimus flap for breast reconstruction 2. Placement tissue expander for breast reconstruction  SURGEON: Irene Limbo MD MBA  ASSISTANT: Caryl Asp RNFA  ANESTHESIA:  General.   EBL: 75 ml  COMPLICATIONS: None immediate.   INDICATIONS FOR PROCEDURE:  The patient, Phyllis Pruitt, is a 45 y.o. female born on 1971-05-23, is here for delayed breast reconstruction following mastectomy and adjuvant radiation treatment.   FINDINGS: Natrelle  133MX-13 500 ml tissue expander, initial fill volume 100 ml. Ref [id number]  DESCRIPTION OF PROCEDURE:  The patient's operative site was marked with the patient in the preoperative area. The patient was taken to the operating room. SCDs, Foley catheter were placed and IV antibiotics were given. Patient was placed in left lateral position, and the patient's operative site was prepped and draped in a sterile fashion. A time out was performed and all information was confirmed to be correct. Incision made in prior right chest mastectomy scar. Skin flaps elevated off underlying chest wall and pectoralis muscle. Dissection completed in subcutaneous plane toward axilla. Incision made surrounding skin paddle designed over right back. Skin and superficial fascia elevated off surface of latissimus muscle and subcutaneous tunnel dissected to axilla joining anterior breast cavity. Anterior border of latissimus identified and elevated. Muscle divided inferiorly at superior iliac spine. Submuscular dissection completed toward midline back and toward origin. Thoracodorsal nerve was divided. Flap rotated into anterior chest cavity. Back irrigated and hemostasis ensured. Exparel  infiltrated total 266 mg for chest and back. 15 Fr drain placed and secured with 2-0 nylon. 2-0 PDS used to placed quilting sutures from elevated skin flaps to chest wall. Incision closed with 0 V lock suture in superficial fascia and dermis. Skin closure completed with 4-0 monocryl subcuticular and tissue glue applied.   The flap muscle and skin paddle oriented to defect. The muscle was secured to pectoralis and serratus muscle and inferiorly to abdominal wall fascia with interrupted 2-0 PDS. 98 Fr JP drain placed in breast cavity and secured to chest with 2-0 nylon. The expander was prepared and placed beneath latissimus muscle. The remainder of latissimus inset inferiorly to abdominal wall fascia. Skin paddle inset with interrupted 3-0 and 4-0 vicryl and monocryl in dermis and 4-0 monocryl subcuticular for skin closure. Tissue adhesive applied. The port was accessed and filled to 100 ml. Tissue adhesive applied. Dry dressing and breast binder applied.   The patient was allowed to wake from anesthesia, extubated and taken to the recovery room in satisfactory condition.   SPECIMENS: none  DRAINS: 15 Fr JP in back, 19 Fr JP in right chest  Irene Limbo, MD [facility] Plastic [facility] Re[facility] Surgery [phone], pin [phone]

## 2016-06-04 ENCOUNTER — Encounter (HOSPITAL_COMMUNITY): Payer: Self-pay | Admitting: Plastic Surgery

## 2016-06-04 MED ORDER — SULFAMETHOXAZOLE-TRIMETHOPRIM 800-160 MG PO TABS: 1.0000 | ORAL_TABLET | Freq: Two times a day (BID) | ORAL | 0 refills | Status: DC

## 2016-06-04 MED ORDER — METHOCARBAMOL 500 MG PO TABS: 500.0000 mg | ORAL_TABLET | Freq: Three times a day (TID) | ORAL | 0 refills | Status: DC | PRN

## 2016-06-04 MED ORDER — HYDROCODONE-ACETAMINOPHEN 5-325 MG PO TABS: 1.0000 | ORAL_TABLET | ORAL | 0 refills | Status: DC | PRN

## 2016-06-04 NOTE — Progress Notes (Signed)
  POD # 1 right latissimus flap, placement tissue expander  Temp:  [97.7 F (36.5 C)-99 F (37.2 C)] 98.8 F (37.1 C) (08/22 0952) Pulse Rate:  [73-99] 83 (08/22 0952) Resp:  [14-18] 18 (08/22 0952) BP: (109-146)/(61-95) 127/74 (08/22 0952) SpO2:  [96 %-100 %] 99 % (08/22 0952) Weight:  [93.8 kg (206 lb 12.7 oz)] 93.8 kg (206 lb 12.7 oz) (08/21 1351)   PO 600 JP back 125 chest 175 ml OOB to BR independently, minimal pain, no IV meds since yesterday afternoon   PE Flap soft, viable, back flat JPs serosanguinous no drainage from incisions  A/P Doing well home today.  Irene Limbo, MD [facility] Plastic & Reconstructive Surgery [phone], pin [phone]

## 2016-06-04 NOTE — Progress Notes (Signed)
Reinforced pt education around drain care with pt verbalizing understanding the teaching. Pt currently waiting for her ride home. Condition stable

## 2016-06-04 NOTE — Discharge Summary (Signed)
Physician Discharge Summary  Patient ID: Phyllis Pruitt MRN: [id number] DOB/AGE: 46/11/1970 46 y.o.  Admit date: 06/03/2016 Discharge date: 06/04/2016  Admission Diagnoses: Acquired absence breast, history breast cancer Discharge Diagnoses:  same  Discharged Condition: stable  Hospital Course: Postoperatively patient ambulatory without assist on POD#0. She required minimal pain medication and tolerated diet.  Treatments: surgery: right latissimus dorsi flap,placement tissue expander for breast reconstruction  Discharge Exam: Blood pressure 127/74, pulse 83, temperature 98.8 F (37.1 C), temperature source Oral, resp. rate 18, height 5' 5.5" (1.664 m), weight 93.8 kg (206 lb 12.7 oz), last menstrual period 06/01/2016, SpO2 99 %. Incision/Wound: flap soft, viable, incisions dry, back flat, drains serosanguinous  Disposition: 01-Home or Self Care  Discharge Instructions    Call MD for:  redness, tenderness, or signs of infection (pain, swelling, bleeding, redness, odor or green/yellow discharge around incision site)    Complete by:  As directed   Call MD for:  temperature >100.5    Complete by:  As directed   Discharge instructions    Complete by:  As directed   Ok to remove dressings and shower am 8.23.17. Soap and water ok, pat incisions dry. No creams or ointments over incisions. Do not let drains dangle in shower, attach to lanyard or similar.Strip and record drains twice daily and bring log to clinic visit.  Breast binder or soft compression bra all other times.  Ok to raise arms above shoulders for bathing and dressing.  No house yard work or exercise until cleared by MD.   Driving Restrictions    Complete by:  As directed   No driving while taking narcotics   Lifting restrictions    Complete by:  As directed   No lifting greater than 5 lbs   Resume previous diet    Complete by:  As directed       Medication List    TAKE these medications   Black Pepper-Turmeric 3-500  MG Caps Take 1 capsule by mouth daily.   GINGER ROOT PO Take 1 tablet by mouth daily.   HYDROcodone-acetaminophen 5-325 MG tablet Commonly known as:  NORCO/VICODIN Take 1-2 tablets by mouth every 4 (four) hours as needed for moderate pain.   methocarbamol 500 MG tablet Commonly known as:  ROBAXIN Take 1 tablet (500 mg total) by mouth every 8 (eight) hours as needed for muscle spasms.   multivitamin tablet Take 1 tablet by mouth daily.   PROBIOTIC PO Take 1 tablet by mouth daily.   sulfamethoxazole-trimethoprim 800-160 MG tablet Commonly known as:  BACTRIM DS,SEPTRA DS Take 1 tablet by mouth 2 (two) times daily.   tamoxifen 20 MG tablet Commonly known as:  NOLVADEX Take 1 tablet (20 mg total) by mouth daily. What changed:  additional instructions      Follow-up Information    Clovis Warwick, MD Follow up in 1 week(s).   Specialty:  Plastic Surgery Why:  as scheduled Contact information: [address] [place] [address] [phone]           Signed: Irene Limbo 06/04/2016, 11:31 AM

## 2016-08-13 ENCOUNTER — Telehealth: Payer: Self-pay | Admitting: Medical Oncology

## 2016-08-13 NOTE — Telephone Encounter (Signed)
PREVENT Call to patient to inquire if she had time during her scheduled visit next week with Dr. [name] to complete 18 month questionnaire for study. Patient states that this will be fine. I thanked patient and confirmed our meeting on November 7th. Patient denies questions.  Adele Dan, RN, BSN Clinical Research 08/13/2016 3:26 PM

## 2016-08-20 ENCOUNTER — Ambulatory Visit: Payer: 59 | Admitting: Hematology and Oncology

## 2016-09-09 ENCOUNTER — Ambulatory Visit (HOSPITAL_BASED_OUTPATIENT_CLINIC_OR_DEPARTMENT_OTHER): Payer: 59 | Admitting: Hematology and Oncology

## 2016-09-09 ENCOUNTER — Encounter: Payer: Self-pay | Admitting: Hematology and Oncology

## 2016-09-09 ENCOUNTER — Encounter: Payer: Self-pay | Admitting: Medical Oncology

## 2016-09-09 VITALS — BP 158/85 | HR 82 | Temp 97.6°F | Resp 18 | Ht 65.0 in | Wt 204.5 lb

## 2016-09-09 DIAGNOSIS — G620 Drug-induced polyneuropathy: Secondary | ICD-10-CM | POA: Diagnosis not present

## 2016-09-09 DIAGNOSIS — Z170 Estrogen receptor positive status [ER+]: Principal | ICD-10-CM

## 2016-09-09 DIAGNOSIS — C50411 Malignant neoplasm of upper-outer quadrant of right female breast: Secondary | ICD-10-CM

## 2016-09-09 DIAGNOSIS — T451X5A Adverse effect of antineoplastic and immunosuppressive drugs, initial encounter: Secondary | ICD-10-CM

## 2016-09-09 DIAGNOSIS — C642 Malignant neoplasm of left kidney, except renal pelvis: Secondary | ICD-10-CM

## 2016-09-09 NOTE — Progress Notes (Signed)
Patient Care Team: [name], MD as PCP - General (Family Medicine) Chauncey Cruel, MD as Consulting Physician (Oncology) Thea Silversmith, MD as Consulting Physician (Radiation Oncology) Mauro Kaufmann, RN as Registered Nurse Rockwell Germany, RN as Registered Nurse Alphonsa Overall, MD as Consulting Physician (General Surgery) Sylvan Cheese, NP as Nurse Practitioner (Hematology and Oncology)  DIAGNOSIS:  Encounter Diagnoses  Name Primary?  . Renal cell carcinoma of left kidney (HCC) Yes  . Chemotherapy-induced peripheral neuropathy ([place])   . Malignant neoplasm of upper-outer quadrant of right breast in female, estrogen receptor positive ([place])     SUMMARY OF ONCOLOGIC HISTORY:   Breast cancer of upper-outer quadrant of right female breast ([place])   01/19/2015 Mammogram    Right breast: density with indistinct margin at 12:00, posterior depth; also oval mass with obscured margin at 11:00, middle depth; irregular mass with indistinct margin central to nipple, middle depth      01/23/2015 Initial Biopsy    Right breast 9:00 and 10:00 biopsy ([facility]): Grade 3 IDC ER/PR negative, HER-2 negative, Ki-67 77% and 73%; the right axillary lymph node biopsy: ER 22% positive      01/30/2015 Breast MRI    Right breast masses 9 and 10:00 position, additional irregular masses with non-mass enhancement upper outer quadrant with one mass extending into upper inner quadrant, multifocal, multicentric 13.7 x 4.6 x 4.6 cm, malignant right axillary lymph node      02/08/2015 Clinical Stage    Stage IIIA: T3, N1, M0      02/09/2015 Procedure    BreastNext panel revealed VUS BRCA2 gene called p.[id number] (c.3749A>G); otherwise no clinically significant variant at ATM, BARD1, BRCA1, BRCA2, BRIP1, CDH1, CHEK2, MRE11A, MUTYH, NBN, NF1, PALB2, PTEN, RAD50, RAD51C, RAD51D, and TP53      02/15/2015 Imaging    No evidence of metastatic disease, 2.2 cm enhancing lesion anterior left lower kidney  suspicious for solid renal neoplasm      02/20/2015 - 07/10/2015 Neo-Adjuvant Chemotherapy    dose dense doxorubicin and cyclophosphamide x 4 followed by Abraxane weekly x 12      07/10/2015 Breast MRI    Multiple irregular enhancing masses right breast are less confluent and smaller dominant mass 10:00 2.1 x 1.9 x 1.3 cm; right breast 11:00 mass measures 1.2 x 1.2 x 0.8 cm, decreased right axillary lymph node thickening      07/28/2015 Definitive Surgery    Rt mastectomy: Multifocal IDC 2 cm, 1.5 cm. 0.6 cm; 3/13 LN positive, ER/PR HER-2      07/28/2015 Pathologic Stage    Stage IIA: T1c N1 M0        09/03/2015 - 10/13/2015 Radiation Therapy    Adjuvant radiation therapy: Right chest wall/50.4 Pear[place] Cables @ 1.8 Gray per fraction x 28 fractions. Right supraclavicular fossa 45 Gy '@1'$ .8 Gy per fraction x 25 fractions      11/15/2015 - 05/10/2016 Chemotherapy    Adjuvant capecitabine - completed 8 cycles      11/16/2015 Survivorship    Survivorship visit completed and copy of care plan given to patient      02/23/2016 Procedure    Left mandible biopsy: Scant material, no malignancy      05/21/2016 -  Anti-estrogen oral therapy    Patient did not take tamoxifen because her ER was weakly positive at 22% and she was worried about toxicities       Renal cell carcinoma of left kidney ([place])   01/29/2016 Surgery  Left renal mass partial resection: 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 We previously prescribed her tamoxifen but she decided not to take it. She will finish her final breast reconstruction procedure on December 8 and she  is very excited about it. She does continue to have fullness and tenderness in the axilla.   REVIEW OF SYSTEMS:   Constitutional: Denies fevers, chills or abnormal weight loss Eyes: Denies blurriness of vision Ears, nose, mouth, throat, and face: Denies mucositis or sore throat Respiratory: Denies cough, dyspnea or wheezes Cardiovascular: Denies palpitation, chest discomfort Gastrointestinal:  Denies nausea, heartburn or change in bowel habits Skin: Denies abnormal skin rashes Lymphatics: Denies new lymphadenopathy or easy bruising Neurological: Peripheral neuropathy in the feet which is quite severe Behavioral/Psych: Mood is stable, no new changes  Extremities: No lower extremity edema All other systems were reviewed with the patient and are negative.  I have reviewed the past medical history, past surgical history, social history and family history with the patient and they are unchanged from previous note.  ALLERGIES:  is allergic to no known allergies.  MEDICATIONS:  Current Outpatient Prescriptions  Medication Sig Dispense Refill  . Black Pepper-Turmeric 3-500 MG CAPS Take 1 capsule by mouth daily.    . Ginger, Zingiber officinalis, (GINGER ROOT PO) Take 1 tablet by mouth daily.    Marland Kitchen HYDROcodone-acetaminophen (NORCO/VICODIN) 5-325 MG tablet Take 1-2 tablets by mouth every 4 (four) hours as needed for moderate pain. 30 tablet 0  . methocarbamol (ROBAXIN) 500 MG tablet Take 1 tablet (500 mg total) by mouth every 8 (eight) hours as needed for muscle spasms. 30 tablet 0  . Multiple Vitamins-Minerals (MULTIVITAMIN) tablet Take 1 tablet by mouth daily.    . Probiotic Product (PROBIOTIC PO) Take 1 tablet by mouth daily.    Marland Kitchen sulfamethoxazole-trimethoprim (BACTRIM DS,SEPTRA DS) 800-160 MG tablet Take 1 tablet by mouth 2 (two) times daily. 12 tablet 0  . tamoxifen (NOLVADEX) 20 MG tablet Take 1 tablet (20 mg total) by mouth daily. (Patient taking differently: Take 20 mg by mouth daily. Not  started yet) 90 tablet 3   No current facility-administered medications for this visit.     PHYSICAL EXAMINATION: ECOG PERFORMANCE STATUS: 2 - Symptomatic, <50% confined to bed  Vitals:   09/09/16 0916  BP: (!) 158/85  Pulse: 82  Resp: 18  Temp: 97.6 F (36.4 C)   Filed Weights   09/09/16 0916  Weight: 204 lb 8 oz (92.8 kg)    GENERAL:alert, no distress and comfortable SKIN: skin color, texture, turgor are normal, no rashes or significant lesions EYES: normal, Conjunctiva are pink and non-injected, sclera clear OROPHARYNX:no exudate, no erythema and lips, buccal mucosa, and tongue normal  NECK: supple, thyroid normal size, non-tender, without nodularity LYMPH:  no palpable lymphadenopathy in the cervical, axillary or inguinal LUNGS: clear to auscultation and percussion with normal breathing effort HEART: regular rate & rhythm and no murmurs and no lower extremity edema ABDOMEN:abdomen soft, non-tender and normal bowel sounds MUSCULOSKELETAL:no cyanosis of digits and no clubbing  NEURO: alert & oriented x 3 with fluent speech, no focal motor/sensory deficits EXTREMITIES: No lower extremity edema  LABORATORY DATA:  I have reviewed the data as listed   Chemistry      Component Value Date/Time   NA 141 05/21/2016 0945   K 3.9 05/21/2016 0945   CL 104 04/23/2016 1551   CO2 25 05/21/2016 0945   BUN 6.5 (L) 05/21/2016 0945   CREATININE 0.9 05/21/2016 0945      Component Value Date/Time   CALCIUM 9.1 05/21/2016 0945   ALKPHOS 92 05/21/2016 0945   AST 16 05/21/2016 0945   ALT 16 05/21/2016 0945   BILITOT 0.38 05/21/2016 0945       Lab Results  Component Value Date   WBC 6.7 05/21/2016   HGB 10.7 (L) 05/21/2016   HCT 33.2 (L) 05/21/2016   MCV 83.1 05/21/2016   PLT 293 05/21/2016   NEUTROABS 5.0 05/21/2016    ASSESSMENT & PLAN:  Breast cancer of upper-outer quadrant of right female breast Right breast invasive ductal carcinoma multifocal, multicentric disease  with at least 8 tumors, 2.3 cm, 1.6 cm of the biggest tumors in addition 6 more tumors 1 cm or less spanning 13.7 cm, right axillary lymph node biopsy positive (ER 22%); primary tumor is ER 0%, PR 0%, HER-2 negative, Ki-67 77% and 73% respectively, grade 3 T3 N1 M0 equals stage IIIa, Grade 3 Rt mastectomy 07/28/15: Multifocal IDC 2 cm, 1.5 cm. 0.6 cm; 3/13 LN positive, T1c N1 M0 stage IIA, ER/PR HER-2 negative. Adjuvant XRT 09/03/15 to 10/13/15  Treatment Plan: Adjuvant Xeloda X 6 months (8 cycles) started 11/15/2015 completed July 2017  Severe peripheral neuropathy: Resolved in the feet much improved in the hands Weight gain: Patient is struggling with this more than anything else. Mandible biopsy: Scant material, no malignancy  Because the lymph node had 22% ER/PR positivity, I recommended starting her on tamoxifen 20 mg daily. But after reviewing the risks and benefits of tamoxifen, patient decided not to take it.   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 No role for adjuvant therapy  Return to clinic in 1 year for follow-up and she will have mammograms in April of every year.  Orders Placed This Encounter  Procedures  . CBC with Differential    Standing Status:   Future    Standing Expiration Date:   09/09/2017  . Comprehensive metabolic panel    Standing Status:   Future    Standing Expiration Date:   09/09/2017   The patient has a good understanding of the overall plan. she agrees with it. she will call with any problems that may develop before the next visit here.   Rulon Eisenmenger, MD 09/09/16

## 2016-09-09 NOTE — Progress Notes (Signed)
PREVENT: 18 month visit Patient here today for her routine visit with Dr. [name]. I met with patient and spouse in exam room. Patient completed 18 month neurocognitive booklet and concomitant medications reviewed with patient. Patient has been off of study drug treatment since December of 2016 when patient signed Withdrawal of Treatment Consent Form, patient has agreed to be followed for data collection. Patient with no/ongoing AE's related to study. I informed patient that her next visit will be in 6 months, and that this will be the 24 month visit and end of study with the neurocognitive exam, research labs and Cardiac MRI. Patient gave verbal understanding. I informed patient of updated information regarding version Protocol Amendment 22 dated 07/15/16 [facility] and that a 25 month visit was added for patients completing 24 months of study drug and the 25 month is to review for related adverse events, per study, re-consent not required. Patient gave verbal understanding to updated information with this new amendment change and denies any questions.  Informed patient that since she has stopped the study drug and has been off of it for approximately one year now, this will more than likely not apply to her. All patient's questions answered to her satisfaction, patient denies further questions at this time. I thanked patient for her continued support and participation in study and encouraged her to call Dr. [name] or myself with any questions/concerns she may have. Patient knows to expect next appointment, 24 months, to be sometime in May of 2018. Adele Dan, RN, BSN Clinical Research 09/09/2016 11:13 AM

## 2016-09-09 NOTE — Assessment & Plan Note (Signed)
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 0.6 cm; 3/13 LN positive, T1c N1 M0 stage IIA, ER/PR HER-2 negative. Adjuvant XRT 09/03/15 to 10/13/15  Treatment Plan: Adjuvant Xeloda X 6 months (8 cycles) started 11/15/2015 completed July 2017  Severe peripheral neuropathy: Resolved in the feet much improved in the hands Weight gain: Patient is struggling with this more than anything else. Mandible biopsy: Scant material, no malignancy  Because the lymph node had 22% ER/PR positivity, I recommended starting her on tamoxifen 20 mg daily.  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 No role for adjuvant therapy

## 2016-09-12 NOTE — H&P (Signed)
Subjective:     Patient ID: Phyllis Pruitt is a 45 y.o. female.  HPI  3 months post op LD/TE placement.  Presented with palpable lump in the right breast. MMG revealed multiple masses in the right breast. US revealed right axillary lymph node. Biopsy of the 9:00 and 10:00 position masses in addition to the right axillary lymph node biopsy area all with IDC, ER/PR and HER-2 - for the primary tumor, but ER +for the axillary lymph node. MRI showed multifocal, multicentric disease with at least 8 tumors, 2.3 cm, 1.6 cm of the biggest tumors in addition 6 more tumors 1 cm or less spanning 13.7 cm. CT scan showed 1.8 x 1.8 x 2.2 cm lesion anterior left lower kidney suspicious for renal neoplasm otherwise no evidence of metastatic disease. Completed neoadjuvant chemotherapy, followed by mastectomy with ALND. Final pathology with multicentric ca, 2 cm, 1.5 cm. 0.6 cm; 3/13 LN positive, T1c N1 M0 stage IIA. Radiation completed 12.30.16. On maintenance Xeloda.   Genetics testing negative with exception BRCA2 VUS.  Also diagnosed with renal mass and underwent excision with Clear cell carcinoma diagnosed, confined to kidney and no further treatment recommended. Diagnosed as part of staging studies done for breast cancer. Procedure done robotic assisted.  Prior 40 D happy with this size. Right mastectomy 1065 g Last MMG left 03/2016 benign.    Review of Systems     Objective:   Physical Exam  Cardiovascular: Normal rate, regular rhythm and normal heart sounds.   Pulmonary/Chest: Effort normal and breath sounds normal.  Abdominal: Soft.  No hernias, infraumbilical with panniculus and laxity of skin      Chest soft,scars maturing,  BW R 14 cm Left breast grade 2 ptosis, no masses    Assessment:     Right breast multifocal, multicentric cancer T3N1 Neoadjuvant chemotherapy  S/p mastectomy, ALND, and adjuvant radiation S/p LD/TE reconstruction    Plan:  Plan removal expander and  placement implant. She has elected for silicone implants, plan smooth round.  Reviewed MRI surveillance for silicone for rupture, risks rupture, contracture, infection requiring removal, rippling.  Additional risks including but not limited to anesthesia, wound healing problems, damage to deeper structures, need for additional procedures, DVT/PE, cardiopulmonary complications, unacceptable cosmetic result, hematoma, seroma reviewed.  At this time patient declines any procedure on left. Patient to date has states she does not want breat lift or reduction for concern loss sensation left NAC. without contralateral procedure she will for certain have smaller breast volume on right, need insert on right to fill out bra.  Discussed lipofilling as adjunct to thicken flaps, possibly make soft tissue envelope softer post radiation. Reviewed variable take graft, need to repeat, fat necrosis presenting as lumps, donor site pain and contour deformities. Patient would like to do this and I have recommended we avoid infraumbilical abdomen as will note increased redundant skin in this area.   Plan OP surgery. Reviewed abdominal compression for 6 weeks pos op, recommend purchase Spanx type garment. Provided Rx for Norco and Cipro.   Natrelle 133MX-13 500 ml tissue expander,  fill volume 470 ml   Irene Limbo, MD [facility] Plastic & Reconstructive Surgery [phone], pin [phone]

## 2016-09-17 ENCOUNTER — Encounter (HOSPITAL_BASED_OUTPATIENT_CLINIC_OR_DEPARTMENT_OTHER): Payer: Self-pay | Admitting: *Deleted

## 2016-09-20 ENCOUNTER — Encounter (HOSPITAL_BASED_OUTPATIENT_CLINIC_OR_DEPARTMENT_OTHER): Payer: Self-pay | Admitting: *Deleted

## 2016-09-20 ENCOUNTER — Ambulatory Visit (HOSPITAL_BASED_OUTPATIENT_CLINIC_OR_DEPARTMENT_OTHER): Payer: 59 | Admitting: Anesthesiology

## 2016-09-20 ENCOUNTER — Encounter (HOSPITAL_BASED_OUTPATIENT_CLINIC_OR_DEPARTMENT_OTHER)
Admission: RE | Disposition: A | Discharge status: Home / Self Care | Payer: Self-pay | Source: Ambulatory Visit | Attending: Plastic Surgery

## 2016-09-20 ENCOUNTER — Ambulatory Visit (HOSPITAL_BASED_OUTPATIENT_CLINIC_OR_DEPARTMENT_OTHER)
Admission: RE | Admit: 2016-09-20 | Discharge: 2016-09-20 | Disposition: A | Discharge status: Home / Self Care | Payer: 59 | Source: Ambulatory Visit | Attending: Plastic Surgery | Admitting: Plastic Surgery

## 2016-09-20 DIAGNOSIS — Z923 Personal history of irradiation: Secondary | ICD-10-CM | POA: Insufficient documentation

## 2016-09-20 DIAGNOSIS — Z8553 Personal history of malignant neoplasm of renal pelvis: Secondary | ICD-10-CM | POA: Diagnosis not present

## 2016-09-20 DIAGNOSIS — Z421 Encounter for breast reconstruction following mastectomy: Secondary | ICD-10-CM | POA: Insufficient documentation

## 2016-09-20 DIAGNOSIS — Z853 Personal history of malignant neoplasm of breast: Secondary | ICD-10-CM | POA: Insufficient documentation

## 2016-09-20 DIAGNOSIS — Z170 Estrogen receptor positive status [ER+]: Secondary | ICD-10-CM | POA: Insufficient documentation

## 2016-09-20 DIAGNOSIS — Z6833 Body mass index (BMI) 33.0-33.9, adult: Secondary | ICD-10-CM | POA: Diagnosis not present

## 2016-09-20 DIAGNOSIS — Z9221 Personal history of antineoplastic chemotherapy: Secondary | ICD-10-CM | POA: Insufficient documentation

## 2016-09-20 DIAGNOSIS — D573 Sickle-cell trait: Secondary | ICD-10-CM | POA: Insufficient documentation

## 2016-09-20 DIAGNOSIS — F329 Major depressive disorder, single episode, unspecified: Secondary | ICD-10-CM | POA: Diagnosis not present

## 2016-09-20 HISTORY — PX: REMOVAL OF TISSUE EXPANDER AND PLACEMENT OF IMPLANT: SHX6457

## 2016-09-20 HISTORY — PX: LIPOSUCTION WITH LIPOFILLING: SHX6436

## 2016-09-20 SURGERY — REMOVAL, TISSUE EXPANDER, BREAST, WITH IMPLANT INSERTION
Anesthesia: General | Site: Breast | Laterality: Right

## 2016-09-20 MED ORDER — LIDOCAINE HCL (CARDIAC) 20 MG/ML IV SOLN
INTRAVENOUS | Status: DC | PRN
  Administered 2016-09-20: 100 mg via INTRAVENOUS

## 2016-09-20 MED ORDER — PROPOFOL 10 MG/ML IV BOLUS
INTRAVENOUS | Status: DC | PRN
Start: 2016-09-20 — End: 2016-09-20
  Administered 2016-09-20: 150 mg via INTRAVENOUS

## 2016-09-20 MED ORDER — ACETAMINOPHEN 500 MG PO TABS
1000.0000 mg | ORAL_TABLET | ORAL | Status: AC
  Administered 2016-09-20: 1000 mg via ORAL

## 2016-09-20 MED ORDER — ONDANSETRON HCL 4 MG/2ML IJ SOLN
INTRAMUSCULAR | Status: DC | PRN
  Administered 2016-09-20: 4 mg via INTRAVENOUS

## 2016-09-20 MED ORDER — ACETAMINOPHEN 500 MG PO TABS
ORAL_TABLET | ORAL | Status: AC
  Filled 2016-09-20: qty 2

## 2016-09-20 MED ORDER — CHLORHEXIDINE GLUCONATE CLOTH 2 % EX PADS: 6.0000 | MEDICATED_PAD | Freq: Once | CUTANEOUS | Status: DC

## 2016-09-20 MED ORDER — MIDAZOLAM HCL 2 MG/2ML IJ SOLN: 0.5000 mg | Freq: Once | INTRAMUSCULAR | Status: DC | PRN

## 2016-09-20 MED ORDER — CEFAZOLIN SODIUM-DEXTROSE 2-4 GM/100ML-% IV SOLN
2.0000 g | INTRAVENOUS | Status: AC
  Administered 2016-09-20: 2 g via INTRAVENOUS

## 2016-09-20 MED ORDER — LIDOCAINE HCL (PF) 1 % IJ SOLN
INTRAMUSCULAR | Status: AC
  Filled 2016-09-20: qty 60

## 2016-09-20 MED ORDER — SODIUM CHLORIDE 0.9 % IV SOLN
INTRAVENOUS | Status: DC | PRN
  Administered 2016-09-20: 1000 mL

## 2016-09-20 MED ORDER — EPINEPHRINE 30 MG/30ML IJ SOLN
INTRAMUSCULAR | Status: AC
  Filled 2016-09-20: qty 1

## 2016-09-20 MED ORDER — DEXAMETHASONE SODIUM PHOSPHATE 4 MG/ML IJ SOLN
INTRAMUSCULAR | Status: DC | PRN
  Administered 2016-09-20: 10 mg via INTRAVENOUS

## 2016-09-20 MED ORDER — SODIUM BICARBONATE 4 % IV SOLN
INTRAVENOUS | Status: AC
  Filled 2016-09-20: qty 10

## 2016-09-20 MED ORDER — LIDOCAINE HCL 2 % IJ SOLN
INTRAMUSCULAR | Status: AC
  Filled 2016-09-20: qty 40

## 2016-09-20 MED ORDER — CEFAZOLIN SODIUM-DEXTROSE 2-4 GM/100ML-% IV SOLN
INTRAVENOUS | Status: AC
  Filled 2016-09-20: qty 100

## 2016-09-20 MED ORDER — PROMETHAZINE HCL 25 MG/ML IJ SOLN: 6.2500 mg | INTRAMUSCULAR | Status: DC | PRN

## 2016-09-20 MED ORDER — SUCCINYLCHOLINE CHLORIDE 200 MG/10ML IV SOSY
PREFILLED_SYRINGE | INTRAVENOUS | Status: AC
  Filled 2016-09-20: qty 10

## 2016-09-20 MED ORDER — SUGAMMADEX SODIUM 200 MG/2ML IV SOLN
INTRAVENOUS | Status: DC | PRN
  Administered 2016-09-20: 200 mg via INTRAVENOUS

## 2016-09-20 MED ORDER — SODIUM BICARBONATE 4 % IV SOLN
INTRAVENOUS | Status: DC | PRN
  Administered 2016-09-20: 1061 mL via INTRAMUSCULAR

## 2016-09-20 MED ORDER — ROCURONIUM BROMIDE 10 MG/ML (PF) SYRINGE
PREFILLED_SYRINGE | INTRAVENOUS | Status: AC
  Filled 2016-09-20: qty 10

## 2016-09-20 MED ORDER — CELECOXIB 200 MG PO CAPS
ORAL_CAPSULE | ORAL | Status: AC
  Filled 2016-09-20: qty 2

## 2016-09-20 MED ORDER — ROCURONIUM BROMIDE 100 MG/10ML IV SOLN
INTRAVENOUS | Status: DC | PRN
  Administered 2016-09-20: 50 mg via INTRAVENOUS

## 2016-09-20 MED ORDER — LIDOCAINE 2% (20 MG/ML) 5 ML SYRINGE
INTRAMUSCULAR | Status: AC
  Filled 2016-09-20: qty 5

## 2016-09-20 MED ORDER — HYDROMORPHONE HCL 1 MG/ML IJ SOLN
0.2500 mg | INTRAMUSCULAR | Status: DC | PRN
  Administered 2016-09-20 (×2): 0.5 mg via INTRAVENOUS

## 2016-09-20 MED ORDER — DEXAMETHASONE SODIUM PHOSPHATE 10 MG/ML IJ SOLN
INTRAMUSCULAR | Status: AC
  Filled 2016-09-20: qty 1

## 2016-09-20 MED ORDER — FENTANYL CITRATE (PF) 100 MCG/2ML IJ SOLN
INTRAMUSCULAR | Status: AC
  Filled 2016-09-20: qty 2

## 2016-09-20 MED ORDER — SUCCINYLCHOLINE CHLORIDE 20 MG/ML IJ SOLN
INTRAMUSCULAR | Status: DC | PRN
  Administered 2016-09-20: 100 mg via INTRAVENOUS

## 2016-09-20 MED ORDER — GABAPENTIN 300 MG PO CAPS
ORAL_CAPSULE | ORAL | Status: AC
  Filled 2016-09-20: qty 1

## 2016-09-20 MED ORDER — LACTATED RINGERS IV SOLN
INTRAVENOUS | Status: DC
  Administered 2016-09-20 (×2): via INTRAVENOUS

## 2016-09-20 MED ORDER — MEPERIDINE HCL 25 MG/ML IJ SOLN: 6.2500 mg | INTRAMUSCULAR | Status: DC | PRN

## 2016-09-20 MED ORDER — GABAPENTIN 300 MG PO CAPS
300.0000 mg | ORAL_CAPSULE | ORAL | Status: AC
  Administered 2016-09-20: 300 mg via ORAL

## 2016-09-20 MED ORDER — MIDAZOLAM HCL 2 MG/2ML IJ SOLN
1.0000 mg | INTRAMUSCULAR | Status: DC | PRN
  Administered 2016-09-20: 2 mg via INTRAVENOUS

## 2016-09-20 MED ORDER — HYDROMORPHONE HCL 1 MG/ML IJ SOLN
INTRAMUSCULAR | Status: AC
  Filled 2016-09-20: qty 1

## 2016-09-20 MED ORDER — CELECOXIB 400 MG PO CAPS
400.0000 mg | ORAL_CAPSULE | ORAL | Status: AC
  Administered 2016-09-20: 400 mg via ORAL

## 2016-09-20 MED ORDER — FENTANYL CITRATE (PF) 100 MCG/2ML IJ SOLN
50.0000 ug | INTRAMUSCULAR | Status: DC | PRN
  Administered 2016-09-20: 100 ug via INTRAVENOUS

## 2016-09-20 MED ORDER — ATROPINE SULFATE 0.4 MG/ML IV SOSY
PREFILLED_SYRINGE | INTRAVENOUS | Status: AC
  Filled 2016-09-20: qty 2.5

## 2016-09-20 MED ORDER — ONDANSETRON HCL 4 MG/2ML IJ SOLN
INTRAMUSCULAR | Status: AC
  Filled 2016-09-20: qty 2

## 2016-09-20 MED ORDER — MIDAZOLAM HCL 2 MG/2ML IJ SOLN
INTRAMUSCULAR | Status: AC
  Filled 2016-09-20: qty 2

## 2016-09-20 MED ORDER — ESMOLOL HCL 100 MG/10ML IV SOLN
INTRAVENOUS | Status: AC
  Filled 2016-09-20: qty 10

## 2016-09-20 MED ORDER — SCOPOLAMINE 1 MG/3DAYS TD PT72: 1.0000 | MEDICATED_PATCH | Freq: Once | TRANSDERMAL | Status: DC | PRN

## 2016-09-20 SURGICAL SUPPLY — 77 items
BAG DECANTER FOR FLEXI CONT (MISCELLANEOUS) ×3 IMPLANT
BINDER ABDOMINAL 10 UNV 27-48 (MISCELLANEOUS) IMPLANT
BINDER ABDOMINAL 12 SM 30-45 (SOFTGOODS) IMPLANT
BINDER BREAST LRG (GAUZE/BANDAGES/DRESSINGS) IMPLANT
BINDER BREAST MEDIUM (GAUZE/BANDAGES/DRESSINGS) IMPLANT
BINDER BREAST XLRG (GAUZE/BANDAGES/DRESSINGS) IMPLANT
BINDER BREAST XXLRG (GAUZE/BANDAGES/DRESSINGS) IMPLANT
BLADE SURG 10 STRL SS (BLADE) ×3 IMPLANT
BLADE SURG 11 STRL SS (BLADE) ×3 IMPLANT
BLADE SURG 15 STRL LF DISP TIS (BLADE) IMPLANT
BLADE SURG 15 STRL SS (BLADE)
BNDG GAUZE ELAST 4 BULKY (GAUZE/BANDAGES/DRESSINGS) ×6 IMPLANT
CANISTER LIPO FAT HARVEST (MISCELLANEOUS) ×3 IMPLANT
CANISTER SUCT 1200ML W/VALVE (MISCELLANEOUS) ×3 IMPLANT
CHLORAPREP W/TINT 26ML (MISCELLANEOUS) ×3 IMPLANT
COMPRESSION GARMENT LG MOREWEL (MISCELLANEOUS) IMPLANT
COMPRESSION GARMENT MD MOREWEL (MISCELLANEOUS) IMPLANT
COMPRESSION GARMENT XL MOREWEL (MISCELLANEOUS) IMPLANT
COVER MAYO STAND STRL (DRAPES) ×3 IMPLANT
DECANTER SPIKE VIAL GLASS SM (MISCELLANEOUS) IMPLANT
DRAIN CHANNEL 15F RND FF W/TCR (WOUND CARE) IMPLANT
DRAPE IMP U-DRAPE 54X76 (DRAPES) IMPLANT
DRSG PAD ABDOMINAL 8X10 ST (GAUZE/BANDAGES/DRESSINGS) ×6 IMPLANT
ELECT BLADE 4.0 EZ CLEAN MEGAD (MISCELLANEOUS) ×3
ELECT COATED BLADE 2.86 ST (ELECTRODE) ×3 IMPLANT
ELECT REM PT RETURN 9FT ADLT (ELECTROSURGICAL) ×3
ELECTRODE BLDE 4.0 EZ CLN MEGD (MISCELLANEOUS) ×1 IMPLANT
ELECTRODE REM PT RTRN 9FT ADLT (ELECTROSURGICAL) ×1 IMPLANT
EVACUATOR SILICONE 100CC (DRAIN) IMPLANT
FILTER LIPOSUCTION (MISCELLANEOUS) ×3 IMPLANT
GLOVE BIO SURGEON STRL SZ 6 (GLOVE) ×6 IMPLANT
GOWN STRL REUS W/ TWL LRG LVL3 (GOWN DISPOSABLE) ×2 IMPLANT
GOWN STRL REUS W/TWL LRG LVL3 (GOWN DISPOSABLE) ×6
IMPL BREAST GEL XFULL SHL 650 (Breast) IMPLANT
IMPL BRST GEL XFULL SHL 650CC (Breast) ×1 IMPLANT
IMPLANT BREAST GEL 650CC (Breast) ×3 IMPLANT
IV NS 500ML (IV SOLUTION) ×3
IV NS 500ML BAXH (IV SOLUTION) ×1 IMPLANT
KIT FILL SYSTEM UNIVERSAL (SET/KITS/TRAYS/PACK) IMPLANT
LINER CANISTER 1000CC FLEX (MISCELLANEOUS) ×3 IMPLANT
LIQUID BAND (GAUZE/BANDAGES/DRESSINGS) ×6 IMPLANT
NDL SAFETY ECLIPSE 18X1.5 (NEEDLE) ×1 IMPLANT
NEEDLE HYPO 18GX1.5 SHARP (NEEDLE) ×3
NS IRRIG 1000ML POUR BTL (IV SOLUTION) ×3 IMPLANT
PACK BASIN DAY SURGERY FS (CUSTOM PROCEDURE TRAY) ×3 IMPLANT
PACK UNIVERSAL I (CUSTOM PROCEDURE TRAY) ×3 IMPLANT
PAD ALCOHOL SWAB (MISCELLANEOUS) ×3 IMPLANT
PENCIL BUTTON HOLSTER BLD 10FT (ELECTRODE) ×3 IMPLANT
PIN SAFETY STERILE (MISCELLANEOUS) IMPLANT
SHEET MEDIUM DRAPE 40X70 STRL (DRAPES) ×6 IMPLANT
SIZER BREAST REUSE XFP 615CC (SIZER) ×3
SIZER BREAST REUSE XFP 650CC (SIZER) ×3
SIZER BRST REUSE XFP 615CC (SIZER) IMPLANT
SIZER BRST REUSE XFP 650CC (SIZER) IMPLANT
SLEEVE SCD COMPRESS KNEE MED (MISCELLANEOUS) ×3 IMPLANT
SPONGE LAP 18X18 X RAY DECT (DISPOSABLE) ×6 IMPLANT
STAPLER VISISTAT 35W (STAPLE) ×3 IMPLANT
SUT ETHILON 2 0 FS 18 (SUTURE) IMPLANT
SUT MNCRL AB 4-0 PS2 18 (SUTURE) ×3 IMPLANT
SUT PDS AB 2-0 CT2 27 (SUTURE) IMPLANT
SUT VIC AB 3-0 PS1 18 (SUTURE)
SUT VIC AB 3-0 PS1 18XBRD (SUTURE) IMPLANT
SUT VIC AB 3-0 SH 27 (SUTURE) ×3
SUT VIC AB 3-0 SH 27X BRD (SUTURE) ×1 IMPLANT
SUT VICRYL 4-0 PS2 18IN ABS (SUTURE) ×3 IMPLANT
SYR 10ML LL (SYRINGE) ×9 IMPLANT
SYR 50ML LL SCALE MARK (SYRINGE) ×12 IMPLANT
SYR BULB IRRIGATION 50ML (SYRINGE) ×6 IMPLANT
SYR CONTROL 10ML LL (SYRINGE) IMPLANT
SYR TB 1ML LL NO SAFETY (SYRINGE) ×3 IMPLANT
TOWEL OR 17X24 6PK STRL BLUE (TOWEL DISPOSABLE) ×6 IMPLANT
TUBE CONNECTING 20'X1/4 (TUBING) ×2
TUBE CONNECTING 20X1/4 (TUBING) ×4 IMPLANT
TUBING INFILTRATION IT-10001 (TUBING) ×3 IMPLANT
TUBING SET GRADUATE ASPIR 12FT (MISCELLANEOUS) ×3 IMPLANT
UNDERPAD 30X30 (UNDERPADS AND DIAPERS) ×6 IMPLANT
YANKAUER SUCT BULB TIP NO VENT (SUCTIONS) ×3 IMPLANT

## 2016-09-20 NOTE — Anesthesia Procedure Notes (Signed)
Procedure Name: Intubation Date/Time: 09/20/2016 7:38 AM Performed by: Lieutenant Diego Pre-anesthesia Checklist: Patient identified, Emergency Drugs available, Suction available and Patient being monitored Patient Re-evaluated:Patient Re-evaluated prior to inductionOxygen Delivery Method: Circle system utilized Preoxygenation: Pre-oxygenation with 100% oxygen Intubation Type: IV induction Ventilation: Mask ventilation without difficulty Laryngoscope Size: Miller and 2 Grade View: Grade I Tube type: Oral Tube size: 7.0 mm Number of attempts: 1 Airway Equipment and Method: Stylet and Oral airway Placement Confirmation: ETT inserted through vocal cords under direct vision,  positive ETCO2 and breath sounds checked- equal and bilateral Secured at: 22 cm Tube secured with: Tape Dental Injury: Teeth and Oropharynx as per pre-operative assessment

## 2016-09-20 NOTE — Anesthesia Postprocedure Evaluation (Signed)
Anesthesia Post Note  Patient: Phyllis Pruitt  Procedure(s) Performed: Procedure(s) (LRB): REMOVAL OF RIGHT TISSUE EXPANDER WITH PLACEMENT OF RIGHT SILICONE BREAST IMPLANTS LIPOFILLING FROM ABDOMEN TO RIGHT CHEST (Right) LIPOSUCTION WITH LIPOFILLING (Right)  Patient location during evaluation: PACU Anesthesia Type: General Level of consciousness: awake and alert, oriented and patient cooperative Pain management: pain level controlled Vital Signs Assessment: post-procedure vital signs reviewed and stable Respiratory status: spontaneous breathing, nonlabored ventilation and respiratory function stable Cardiovascular status: blood pressure returned to baseline and stable Postop Assessment: no signs of nausea or vomiting Anesthetic complications: no    Last Vitals:  Vitals:   09/20/16 1053 09/20/16 1134  BP:  (!) 150/90  Pulse: 79 88  Resp: 13 18  Temp:  36.5 C    Last Pain:  Vitals:   09/20/16 1134  TempSrc: Oral  PainSc: 0-No pain                 Casara Perrier,E. Jozi Malachi

## 2016-09-20 NOTE — Transfer of Care (Signed)
Immediate Anesthesia Transfer of Care Note  Patient: Phyllis Pruitt  Procedure(s) Performed: Procedure(s) with comments: REMOVAL OF RIGHT TISSUE EXPANDER WITH PLACEMENT OF RIGHT SILICONE BREAST IMPLANTS LIPOFILLING FROM ABDOMEN TO RIGHT CHEST (Right) - PLACEMENT OF RIGHT SILICONE BREAST IMPLANTS LIPOFILLING FROM ABDOMEN TO RIGHT CHEST LIPOSUCTION WITH LIPOFILLING (Right) - LIPOSUCTION WITH LIPOFILLING  Patient Location: PACU  Anesthesia Type:General  Level of Consciousness: awake  Airway & Oxygen Therapy: Patient Spontanous Breathing and Patient connected to face mask oxygen  Post-op Assessment: Report given to RN and Post -op Vital signs reviewed and stable  Post vital signs: Reviewed and stable  Last Vitals:  Vitals:   09/20/16 0629  BP: (!) 154/96  Pulse: 84  Resp: 18  Temp: 37 C    Last Pain:  Vitals:   09/20/16 0629  TempSrc: Oral      Patients Stated Pain Goal: 0 ([id number])  Complications: No apparent anesthesia complications

## 2016-09-20 NOTE — Interval H&P Note (Signed)
History and Physical Interval Note:  09/20/2016 7:07 AM  Phyllis Pruitt  has presented today for surgery, with the diagnosis of HISTORY OF BREAST CANCER, ACQUIRED ABSENCE OF BREAST, HISTORY OF THERAPEUTIC RADIATION  The various methods of treatment have been discussed with the patient and family. After consideration of risks, benefits and other options for treatment, the patient has consented to  Procedure(s): REMOVAL OF RIGHT TISSUE EXPANDER WITH PLACEMENT OF RIGHT SILICONE BREAST IMPLANTS LIPOFILLING FROM ABDOMIN TO RIGHT CHEST (Right) LIPOSUCTION WITH LIPOFILLING (Right) as a surgical intervention .  The patient's history has been reviewed, patient examined, no change in status, stable for surgery.  I have reviewed the patient's chart and labs.  Questions were answered to the patient's satisfaction.     Phyllis Pruitt

## 2016-09-20 NOTE — Discharge Instructions (Signed)
Call your surgeon if you experience:   1.  Fever over 101.0. 2.  Inability to urinate. 3.  Nausea and/or vomiting. 4.  Extreme swelling or bruising at the surgical site. 5.  Continued bleeding from the incision. 6.  Increased pain, redness or drainage from the incision. 7.  Problems related to your pain medication. 8.  Any problems and/or concerns  Postoperative Anesthesia Instructions-Pediatric  Activity: Your child should rest for the remainder of the day. A responsible adult should stay with your child for 24 hours.  Meals: Your child should start with liquids and light foods such as gelatin or soup unless otherwise instructed by the physician. Progress to regular foods as tolerated. Avoid spicy, greasy, and heavy foods. If nausea and/or vomiting occur, drink only clear liquids such as apple juice or Pedialyte until the nausea and/or vomiting subsides. Call your physician if vomiting continues.  Special Instructions/Symptoms: Your child may be drowsy for the rest of the day, although some children experience some hyperactivity a few hours after the surgery. Your child may also experience some irritability or crying episodes due to the operative procedure and/or anesthesia. Your child's throat may feel dry or sore from the anesthesia or the breathing tube placed in the throat during surgery. Use throat lozenges, sprays, or ice chips if needed.

## 2016-09-20 NOTE — Op Note (Signed)
Operative Note   DATE OF OPERATION: 12.8.17  LOCATION: [facility]-outpatient  SURGICAL DIVISION: Plastic Surgery  PREOPERATIVE DIAGNOSES:  1. History right breast cancer 2. Acquired absence breast 3. History therapeutic radiation  POSTOPERATIVE DIAGNOSES:  same  PROCEDURE:  1. Removal right tissue expander and placement silicone implant 2. Lipofilling to right chest  SURGEON: Irene Limbo MD MBA  ASSISTANT: none  ANESTHESIA:  General.   EBL: 50 ml  COMPLICATIONS: None immediate.   INDICATIONS FOR PROCEDURE:  The patient, Phyllis Pruitt, is a 45 y.o. female born on 12/04/1970, is here for implant exchange following delayed reconstruction with latissimus flap and tissue expander placement.   FINDINGS: Herma Carson Smooth Round Extra Projection Responsive Gel 650 ml implant placed, REF SRX-650 [id number]. 75 ml fat infiltrated over right chest.  DESCRIPTION OF PROCEDURE:  The patient's operative site was marked with the patient in the preoperative area to mark chest midline, sternal notch, desired anterior axillary line, as well as supra abdomen and flanks donor site. The patient was taken to the operating room. SCDs were placed and IV antibiotics were given. The patient's operative site was prepped and draped in a sterile fashion. A time out was performed and all information was confirmed to be correct.  Incision made over inferior inset of skin paddle latissimus flap on right. Incision carried to latissimus muscle and muscle divided in direction of fibers.  Expander removed. Superior capsulotomies performed and scoring lower pole completed. Sizer placed. Patient brought to upright sitting position. Natrelle Extra Projection 650 ml implant selected. Patient returned to supine position.   Stab incision made over bilateral lateral abdomen and tumescent fluid infiltrated over supraumbilical abdomen and bilateral flanks, total 400 ml tumescent infiltrated. Power assisted  liposuction performed to endpoint symmetric contour and soft tissue thickness. The fat was then washed and prepared by gravity for infiltration. Harvested fat was then infiltrated in subcutaneous and intramuscular planes throughout right mastectomy flaps.   At this time right breast cavity irrigated with solution containing Ancef, gentamicin, and bacitracin, followed by Betadine. Implant placed in right breast cavity. Care taken to ensure proper orientation. Closure was completed with running 3-0 vicryl for approximation of muscle and superficial fascia. 4-0 vicryl was placed in dermis and running 4-0 monocryl was used to close skin.    Tissue adhesive applied to breast incision. The abdominal incisions were approximated with 4-0 monocryl. Dry dressing and abdominal and breast binders applied.   The patient was allowed to wake from anesthesia, extubated and taken to the recovery room in satisfactory condition.   SPECIMENS: none  DRAINS: none  Irene Limbo, MD [facility] Plastic & Reconstructive Surgery [phone], pin [phone]

## 2016-09-20 NOTE — Anesthesia Preprocedure Evaluation (Signed)
Anesthesia Evaluation  Patient identified by MRN, date of birth, ID band Patient awake    Reviewed: Allergy & Precautions, NPO status , Patient's Chart, lab work & pertinent test results  History of Anesthesia Complications Negative for: history of anesthetic complications  Airway Mallampati: II  TM Distance: >3 FB Neck ROM: Full    Dental  (+) Dental Advisory Given   Pulmonary neg pulmonary ROS,    breath sounds clear to auscultation       Cardiovascular negative cardio ROS   Rhythm:Regular Rate:Normal  '16 ECHO: EF 60-65%, valves OK   Neuro/Psych Depression    GI/Hepatic negative GI ROS, Neg liver ROS,   Endo/Other  Morbid obesity  Renal/GU Benign renal lesion: surgical excision     Musculoskeletal   Abdominal (+) + obese,   Peds  Hematology  (+) Sickle cell trait ,   Anesthesia Other Findings Breast cancer  Reproductive/Obstetrics                             Anesthesia Physical Anesthesia Plan  ASA: II  Anesthesia Plan: General   Post-op Pain Management:    Induction: Intravenous  Airway Management Planned: Oral ETT  Additional Equipment:   Intra-op Plan:   Post-operative Plan: Extubation in OR  Informed Consent: I have reviewed the patients History and Physical, chart, labs and discussed the procedure including the risks, benefits and alternatives for the proposed anesthesia with the patient or authorized representative who has indicated his/her understanding and acceptance.   Dental advisory given  Plan Discussed with: CRNA and Surgeon  Anesthesia Plan Comments: (Plan routine monitors, GETA)        Anesthesia Quick Evaluation

## 2016-09-23 ENCOUNTER — Telehealth: Payer: Self-pay | Admitting: General Practice

## 2016-09-23 ENCOUNTER — Encounter (HOSPITAL_BASED_OUTPATIENT_CLINIC_OR_DEPARTMENT_OTHER): Payer: Self-pay | Admitting: Plastic Surgery

## 2016-09-23 NOTE — Telephone Encounter (Signed)
Left msg regarding 09/09/2017 appts.

## 2016-10-30 ENCOUNTER — Other Ambulatory Visit: Payer: Self-pay | Admitting: Nurse Practitioner

## 2016-12-23 ENCOUNTER — Other Ambulatory Visit: Payer: Self-pay | Admitting: Obstetrics and Gynecology

## 2016-12-23 DIAGNOSIS — R899 Unspecified abnormal finding in specimens from other organs, systems and tissues: Secondary | ICD-10-CM

## 2016-12-26 ENCOUNTER — Ambulatory Visit
Admission: RE | Admit: 2016-12-26 | Discharge: 2016-12-26 | Disposition: A | Discharge status: Home / Self Care | Payer: 59 | Source: Ambulatory Visit | Attending: Obstetrics and Gynecology | Admitting: Obstetrics and Gynecology

## 2016-12-26 DIAGNOSIS — R899 Unspecified abnormal finding in specimens from other organs, systems and tissues: Secondary | ICD-10-CM

## 2016-12-26 MED ORDER — IOPAMIDOL (ISOVUE-300) INJECTION 61%
125.0000 mL | Freq: Once | INTRAVENOUS | Status: AC | PRN
  Administered 2016-12-26: 125 mL via INTRAVENOUS

## 2017-02-13 ENCOUNTER — Telehealth: Payer: Self-pay | Admitting: Medical Oncology

## 2017-02-13 NOTE — Telephone Encounter (Signed)
PREVENT LVMOM with patient regarding 24 month study visit. This will be patient's last visit for study. Asked patient to call back at her earliest convenience, contact number provided.  Adele Dan, RN, BSN Clinical Research 02/13/2017 1:37 PM

## 2017-02-19 ENCOUNTER — Telehealth: Payer: Self-pay | Admitting: Medical Oncology

## 2017-02-19 ENCOUNTER — Other Ambulatory Visit: Payer: Self-pay | Admitting: Hematology and Oncology

## 2017-02-19 ENCOUNTER — Other Ambulatory Visit: Payer: Self-pay

## 2017-02-19 DIAGNOSIS — Z006 Encounter for examination for normal comparison and control in clinical research program: Secondary | ICD-10-CM

## 2017-02-19 DIAGNOSIS — C50411 Malignant neoplasm of upper-outer quadrant of right female breast: Secondary | ICD-10-CM

## 2017-02-19 DIAGNOSIS — Z170 Estrogen receptor positive status [ER+]: Principal | ICD-10-CM

## 2017-02-19 NOTE — Telephone Encounter (Signed)
PREVENT: Patient returned my call today and LVMOM regarding 24 month on study visit. Patient gave me times that she is available for appointments; any day that is earliest time in morning or any day after 12 noon. Appointment for cardiac MRI and 24 month assessments made for May 18th starting with MRI at 1pm with lab and research nurse visit to follow. I left VM with patient of times and asked her to call me back if these times do not work for her. Patient thanked for her time and continued support of study.

## 2017-02-19 NOTE — Progress Notes (Signed)
Pt requesting to see PT for lymphedema drainage. Pt states that she had always had some swelling, but now, has increased swelling over the past 2 weeks and some soreness. Pt states that she takes ibuprofen and muscle relaxant to help with the pain. Pt has a sleeve but is having difficulty with the swelling at this time. Pt is not currently taking any tamoxifen at this time. Notified Dr.Gudena of this information and is okay to proceed with referral. Pt did state that she had increased her physical activity with exercising day and night. She confirms that she has not been lifting this past 2 weeks that could have contributed to her symptoms. Will send referral for PT cancer rehab (lymphedema management).  [facility] [facility] to confirm referral and they will be getting in touch with pt for appt.

## 2017-02-20 ENCOUNTER — Ambulatory Visit (HOSPITAL_BASED_OUTPATIENT_CLINIC_OR_DEPARTMENT_OTHER): Payer: 59 | Admitting: Hematology and Oncology

## 2017-02-20 ENCOUNTER — Other Ambulatory Visit: Payer: Self-pay | Admitting: *Deleted

## 2017-02-20 ENCOUNTER — Other Ambulatory Visit: Payer: Self-pay | Admitting: Hematology and Oncology

## 2017-02-20 ENCOUNTER — Telehealth: Payer: Self-pay | Admitting: *Deleted

## 2017-02-20 ENCOUNTER — Ambulatory Visit: Payer: 59 | Attending: Hematology and Oncology | Admitting: Physical Therapy

## 2017-02-20 ENCOUNTER — Encounter: Payer: Self-pay | Admitting: *Deleted

## 2017-02-20 ENCOUNTER — Encounter: Payer: Self-pay | Admitting: Hematology and Oncology

## 2017-02-20 DIAGNOSIS — I972 Postmastectomy lymphedema syndrome: Secondary | ICD-10-CM | POA: Insufficient documentation

## 2017-02-20 DIAGNOSIS — Z170 Estrogen receptor positive status [ER+]: Secondary | ICD-10-CM

## 2017-02-20 DIAGNOSIS — M25611 Stiffness of right shoulder, not elsewhere classified: Secondary | ICD-10-CM | POA: Diagnosis present

## 2017-02-20 DIAGNOSIS — C642 Malignant neoplasm of left kidney, except renal pelvis: Secondary | ICD-10-CM

## 2017-02-20 DIAGNOSIS — C50411 Malignant neoplasm of upper-outer quadrant of right female breast: Secondary | ICD-10-CM

## 2017-02-20 DIAGNOSIS — R635 Abnormal weight gain: Secondary | ICD-10-CM

## 2017-02-20 DIAGNOSIS — G620 Drug-induced polyneuropathy: Secondary | ICD-10-CM

## 2017-02-20 DIAGNOSIS — R293 Abnormal posture: Secondary | ICD-10-CM | POA: Insufficient documentation

## 2017-02-20 DIAGNOSIS — R2231 Localized swelling, mass and lump, right upper limb: Secondary | ICD-10-CM

## 2017-02-20 NOTE — Telephone Encounter (Signed)
Received call from [place], PT stating patient has a right axillary mass that she found.  Appointment confirmed with Dr. [name] for today at 215pm and appointment made for right axillary ultrasound for 5/11 at 9am.  Patient aware.

## 2017-02-20 NOTE — Therapy (Addendum)
[place], [place], [address] Phone: [phone]   Fax:  [phone]  Physical Therapy Evaluation  Patient Details  Name: Phyllis Pruitt MRN: [id number] Date of Birth: 1970/12/26 Referring Provider: Dr. Nicholas Lose  Encounter Date: 02/20/2017      PT End of Session - 02/20/17 1014    Visit Number 1   Number of Visits 12   Date for PT Re-Evaluation 03/20/17   PT Start Time 0930   PT Stop Time 1015   PT Time Calculation (min) 45 min   Activity Tolerance Patient tolerated treatment well   Behavior During Therapy [facility] for tasks assessed/performed      Past Medical History:  Diagnosis Date  . Anemia    during pregnancy  . Breast cancer of upper-outer quadrant of right female breast ([place]) 01/27/2015  . Depression    hx denies any problems now  . History of kidney cancer 12/2015   left removed "spot"  . Lymphedema    rt arm  . Neuropathy ([place])   . Sickle cell trait [facility])     Past Surgical History:  Procedure Laterality Date  . LATISSIMUS FLAP TO BREAST Right 06/03/2016   Procedure: RIGHT LATISSIMUS FLAP TO BREAST;  Surgeon: Irene Limbo, MD;  Location: [place];  Service: Plastics;  Laterality: Right;  . LIPOSUCTION WITH LIPOFILLING Right 09/20/2016   Procedure: LIPOSUCTION WITH LIPOFILLING;  Surgeon: Irene Limbo, MD;  Location: [place];  Service: Plastics;  Laterality: Right;  LIPOSUCTION WITH LIPOFILLING  . MASTECTOMY    . MASTECTOMY MODIFIED RADICAL Right 07/28/2015  . MASTECTOMY MODIFIED RADICAL Right 07/28/2015   Procedure: RIGHT MODIFIED RADICAL MASTECTOMY;  Surgeon: Alphonsa Overall, MD;  Location: [place];  Service: General;  Laterality: Right;  . PORT-A-CATH REMOVAL  07/28/2015  . PORT-A-CATH REMOVAL  07/28/2015   Procedure: REMOVAL PORT-A-CATH;  Surgeon: Alphonsa Overall, MD;  Location: [place];  Service: General;;  . PORTACATH PLACEMENT N/A 02/10/2015   Procedure:  INSERTION PORT-A-CATH WITH ULTRA SOUND, left subclavian,;  Surgeon: Alphonsa Overall, MD;  Location: [facility];  Service: General;  Laterality: N/A;  . REMOVAL OF TISSUE EXPANDER AND PLACEMENT OF IMPLANT Right 09/20/2016   Procedure: REMOVAL OF RIGHT TISSUE EXPANDER WITH PLACEMENT OF RIGHT SILICONE BREAST IMPLANTS LIPOFILLING FROM ABDOMEN TO RIGHT CHEST;  Surgeon: Irene Limbo, MD;  Location: [place];  Service: Plastics;  Laterality: Right;  PLACEMENT OF RIGHT SILICONE BREAST IMPLANTS LIPOFILLING FROM ABDOMEN TO RIGHT CHEST  . ROBOTIC ASSITED PARTIAL NEPHRECTOMY Left 12/29/2015   Procedure: XI ROBOTIC ASSITED PARTIAL NEPHRECTOMY;  Surgeon: Cleon Gustin, MD;  Location: [facility];  Service: Urology;  Laterality: Left;  . TISSUE EXPANDER PLACEMENT Right 06/03/2016   Procedure: PLACEMENT RIGHT TISSUE EXPANDER;  Surgeon: Irene Limbo, MD;  Location: [place];  Service: Plastics;  Laterality: Right;  . TONSILLECTOMY    . TUBAL LIGATION    . wisdome teeth extraction      There were no vitals filed for this visit.       Subjective Assessment - 02/20/17 0940    Subjective Right mastectomy with 13 nodes removed in 10/16 and reconstruction completed 12/17. Neoadjuvant chemotherapy and radiation and after surgery. Right arm swelling began 2/17. Right arm pain began 2 weeks ago and she reports it is severe. She attributes this to possibly increasing her exercise routine to include treadmill for 1 hour 3x/week. She reports pain in her right arm since reconstruction but it has  been intermittent until 2 weeks ago which has been constant. She also reports feeling a knot in her right axilla near her pec muscle which she noticed 2 days ago. She came to our clinic yesterday requesting to be seen but has not seen a doctor since 3/18 when she saw her OB-Gyn. She reports she is due for a mammogram now but it is not yet scheduled. She is currentlly wearing a compression sleeve but it appears very worn.    Pertinent History Neoadjuvant chemotherapy 5/16-9/16; Right mastectomy and axillary node dissection 10/16; radiation after surgery; reconstruction completed with implant 12/17.   Patient Stated Goals Decrease pain and better manage swelling.   Currently in Pain? Yes   Pain Score 8    Pain Location Arm  Begins in axilla and radiates into her arm   Pain Orientation Right   Pain Descriptors / Indicators Constant;Aching   Pain Type Acute pain   Pain Onset 1 to 4 weeks ago   Pain Frequency Constant   Aggravating Factors  Nothing changes pain   Pain Relieving Factors Nothing changes pain   Multiple Pain Sites No            [facility] PT Assessment - 02/20/17 0001      Assessment   Medical Diagnosis Right arm lymphedema   Referring Provider Dr. Nicholas Lose   Onset Date/Surgical Date 02/06/17   Hand Dominance Left   Prior Therapy 2017     Precautions   Precautions Other (comment)   Precaution Comments Right arm lymphedema and hx right breast cancer     Restrictions   Weight Bearing Restrictions No     Balance Screen   Has the patient fallen in the past 6 months No   Has the patient had a decrease in activity level because of a fear of falling?  No   Is the patient reluctant to leave their home because of a fear of falling?  No     Home Environment   Living Environment Private residence   Living Arrangements Spouse/significant other;Children  Husband, 70 and 83 y.o. sons   Available Help at Discharge Family     Prior Function   Level of Independence Independent   Vocation Unemployed   Leisure Treadmill 3x/week for 1 hour; Zumba 5x/week     Cognition   Overall Cognitive Status Within Functional Limits for tasks assessed     Observation/Other Assessments   Other Surveys  --  Lymphedema Life Impact Scale impairment 40%     Posture/Postural Control   Posture/Postural Control Postural limitations   Postural Limitations Rounded Shoulders;Forward head     ROM / Strength    AROM / PROM / Strength AROM;Strength     AROM   AROM Assessment Site Shoulder   Right/Left Shoulder Right;Left   Right Shoulder Extension 35 Degrees   Right Shoulder Flexion 127 Degrees   Right Shoulder ABduction 112 Degrees   Right Shoulder Internal Rotation 58 Degrees   Right Shoulder External Rotation 73 Degrees   Left Shoulder Extension 45 Degrees   Left Shoulder Flexion 158 Degrees   Left Shoulder ABduction 162 Degrees   Left Shoulder Internal Rotation 72 Degrees   Left Shoulder External Rotation 90 Degrees           LYMPHEDEMA/ONCOLOGY QUESTIONNAIRE - 02/20/17 0956      Type   Cancer Type Right breast     Surgeries   Mastectomy Date 07/15/15   Axillary Lymph Node Dissection Date 07/15/15  Number Lymph Nodes Removed 13     Date Lymphedema/Swelling Started   Date 10/15/15     Treatment   Active Chemotherapy Treatment No   Past Chemotherapy Treatment Yes   Active Radiation Treatment No   Past Radiation Treatment Yes   Body Site right breast and axilla     What other symptoms do you have   Are you Having Heaviness or Tightness Yes   Are you having Pain Yes   Are you having pitting edema Yes   Body Site right forearm   Is it Hard or Difficult finding clothes that fit No   Do you have infections No   Is there Decreased scar mobility No   Stemmer Sign No     Lymphedema Stage   Stage STAGE 2 SPONTANEOUSLY IRREVERSIBLE     Lymphedema Assessments   Lymphedema Assessments Upper extremities     Right Upper Extremity Lymphedema   15 cm Proximal to Olecranon Process 33.5 cm   10 cm Proximal to Olecranon Process 32 cm   Olecranon Process 27.2 cm   15 cm Proximal to Ulnar Styloid Process 26.5 cm   10 cm Proximal to Ulnar Styloid Process 23.3 cm   Just Proximal to Ulnar Styloid Process 17.8 cm   Across Hand at [employer] 20.2 cm   At [place] of 2nd Digit 6.1 cm     Left Upper Extremity Lymphedema   15 cm Proximal to Olecranon Process 34.3 cm   10 cm  Proximal to Olecranon Process 32.4 cm   Olecranon Process 27.4 cm   15 cm Proximal to Ulnar Styloid Process 26.2 cm   10 cm Proximal to Ulnar Styloid Process 22.9 cm   Just Proximal to Ulnar Styloid Process 16.3 cm   Across Hand at [employer] 20.2 cm   At [place] of 2nd Digit 6.4 cm                                Long Term Clinic Goals - 02/20/17 1051      CC Long Term Goal  #1   Title Patient will verbalize understanding of where and how to be fitted for a compression sleeve and glove.   Time 4   Period Weeks   Status New     CC Long Term Goal  #2   Title Patient will increase right shoulder flexion and abduction to >/= 150 degrees for increased ease reaching and improved lymphatic circulation.   Baseline .   Time 4   Period Weeks   Status New     CC Long Term Goal  #3   Title Patient will report >/= 25% reduction in right arm pain to tolerate using arm for normal ADLs.   Baseline .   Time 4   Period Weeks   Status New     CC Long Term Goal  #4   Title Reduce right arm circumference by >/= 1 cm at 10 cm proximal to ulnar styloid process.   Time 4   Period Weeks   Status New     CC Long Term Goal  #5   Title Reduce right arm circumference by >/= 1 cm at 10 cm proximal to olecranon.   Time 4   Period Weeks   Status New            Plan - 02/20/17 1046    Clinical Impression Statement Patient was  referred to PT per her own request for right arm lymphedema and pain. Her pain began acutely 2 weeks ago when she noticed a "knot" in her axilla. She has not had a recent mammogram or f/u with her oncologist. She is 1.5 years s/p neoadjuvant chemotherapy, right mastectomy with ALND (13 nodes), and radiation. Right breast reconstruction completed 12/17 and has had some pain since then in her right arm but became severe and constant about 2 weeks ago. She does have worsening of her right arm lymphedema and will benefit from PT to address that. Phoned  breast nurse navigator about concerns of palpable axillary knot and she will be seen by her oncologist today at 14:15. Due to the evolving nature of her condition, the breast cancer history and the uncertainty of her current situation, her eval is of moderate complexity.   Rehab Potential Good   Clinical Impairments Affecting Rehab Potential Concerns of other pathology   PT Frequency 2x / week   PT Duration 4 weeks   PT Treatment/Interventions ADLs/Self Care Home Management;Electrical Stimulation;Passive range of motion;Compression bandaging;Therapeutic exercise;Manual lymph drainage;Therapeutic activities;Manual techniques;DME Instruction;Patient/family education   PT Next Visit Plan Pt to see oncologist today and may require further work up. If cleared, begin manual lymph drainage for right arm and pt will need new compression sleeve and glove. Currently she is wearing a torn sample garment without a glove.   Consulted and Agree with Plan of Care Patient      Patient will benefit from skilled therapeutic intervention in order to improve the following deficits and impairments:  Pain, Impaired UE functional use, Postural dysfunction, Increased edema  Visit Diagnosis: Post-mastectomy lymphedema syndrome  Abnormal posture  Shoulder stiffness, right     Problem List Patient Active Problem List   Diagnosis Date Noted  . Acquired absence of breast and nipple 06/03/2016  . Renal cell carcinoma of left kidney ([place]) 02/06/2016  . Left ovarian cyst 01/15/2016  . Renal mass 12/29/2015  . 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 Genetic testing 03/07/2015  . Left renal mass 02/17/2015  . Breast cancer of upper-outer quadrant of right female breast ([place]) 01/27/2015   Annia Friendly, PT 02/20/17 10:57 AM  [place], [place], [address] Phone: [phone]   Fax:  [phone]  Name:  Phyllis Pruitt MRN: [id number] Date of Birth: 05/12/1971  PHYSICAL THERAPY DISCHARGE SUMMARY  Visits from Start of Care: 1  Current functional level related to goals / functional outcomes: Unknown. She was seen for an eval and the PT had concerns of metastatic disease and was referred back to her MD where unfortunately she was diagnosed with metastatic breast cancer.   Remaining deficits: Unknown   Education / Equipment: None Plan: Patient agrees to discharge.  Patient goals were not met. Patient is being discharged due to a change in medical status.  ?????         Annia Friendly, [place] 11/10/17 4:14 PM

## 2017-02-20 NOTE — Progress Notes (Signed)
Patient Care Team: [name], MD as PCP - General (Family Medicine) Magrinat, Virgie Dad, MD as Consulting Physician (Oncology) Thea Silversmith, MD (Inactive) as Consulting Physician (Radiation Oncology) Mauro Kaufmann, RN as Registered Nurse Rockwell Germany, RN as Registered Nurse Alphonsa Overall, MD as Consulting Physician (General Surgery) Sylvan Cheese, NP as Nurse Practitioner (Hematology and Oncology)  DIAGNOSIS:  Encounter Diagnosis  Name Primary?  . Malignant neoplasm of upper-outer quadrant of right breast in female, estrogen receptor positive ([place])     SUMMARY OF ONCOLOGIC HISTORY:   Breast cancer of upper-outer quadrant of right female breast ([place])   01/19/2015 Mammogram    Right breast: density with indistinct margin at 12:00, posterior depth; also oval mass with obscured margin at 11:00, middle depth; irregular mass with indistinct margin central to nipple, middle depth      01/23/2015 Initial Biopsy    Right breast 9:00 and 10:00 biopsy ([facility]): Grade 3 IDC ER/PR negative, HER-2 negative, Ki-67 77% and 73%; the right axillary lymph node biopsy: ER 22% positive      01/30/2015 Breast MRI    Right breast masses 9 and 10:00 position, additional irregular masses with non-mass enhancement upper outer quadrant with one mass extending into upper inner quadrant, multifocal, multicentric 13.7 x 4.6 x 4.6 cm, malignant right axillary lymph node      02/08/2015 Clinical Stage    Stage IIIA: T3, N1, M0      02/09/2015 Procedure    BreastNext panel revealed VUS BRCA2 gene called [id number] (c.[id number]); otherwise no clinically significant variant at ATM, BARD1, BRCA1, BRCA2, BRIP1, CDH1, CHEK2, MRE11A, MUTYH, NBN, NF1, PALB2, PTEN, RAD50, RAD51C, RAD51D, and TP53      02/15/2015 Imaging    No evidence of metastatic disease, 2.2 cm enhancing lesion anterior left lower kidney suspicious for solid renal neoplasm      02/20/2015 - 07/10/2015 Neo-Adjuvant Chemotherapy   dose dense doxorubicin and cyclophosphamide x 4 followed by Abraxane weekly x 12      07/10/2015 Breast MRI    Multiple irregular enhancing masses right breast are less confluent and smaller dominant mass 10:00 2.1 x 1.9 x 1.3 cm; right breast 11:00 mass measures 1.2 x 1.2 x 0.8 cm, decreased right axillary lymph node thickening      07/28/2015 Definitive Surgery    Rt mastectomy: Multifocal IDC 2 cm, 1.5 cm. 0.6 cm; 3/13 LN positive, ER/PR HER-2      07/28/2015 Pathologic Stage    Stage IIA: T1c N1 M0        09/03/2015 - 10/13/2015 Radiation Therapy    Adjuvant radiation therapy: Right chest wall/50.4 Pearline Cables @ 1.8 Gray per fraction x 28 fractions. Right supraclavicular fossa 45 Gy '@1'$ .8 Gy per fraction x 25 fractions      11/15/2015 - 05/10/2016 Chemotherapy    Adjuvant capecitabine - completed 8 cycles      11/16/2015 Survivorship    Survivorship visit completed and copy of care plan given to patient      02/23/2016 Procedure    Left mandible biopsy: Scant material, no malignancy      05/21/2016 -  Anti-estrogen oral therapy    Patient did not take tamoxifen because her ER was weakly positive at 22% and she was worried about toxicities       Renal cell carcinoma of left kidney ([place])   01/29/2016 Surgery    Left renal mass partial resection: 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 She also received adjuvant radiation and adjuvant Xeloda. She came in today complaining of hip tender spot in the right axilla/chest wall area which is almost pea-sized and is causing her pain and discomfort in both arm in the breast. She went to see  physical therapy for this reason and she was sent to [place] for evaluation.  REVIEW OF SYSTEMS:   Constitutional: Denies fevers, chills or abnormal weight loss Eyes: Denies blurriness of vision Ears, nose, mouth, throat, and face: Denies mucositis or sore throat Respiratory: Denies cough, dyspnea or wheezes Cardiovascular: Denies palpitation, chest discomfort Gastrointestinal:  Denies nausea, heartburn or change in bowel habits Skin: Denies abnormal skin rashes Lymphatics: Denies new lymphadenopathy or easy bruising Neurological:Denies numbness, tingling or new weaknesses Behavioral/Psych: Mood is stable, no new changes  Extremities: No lower extremity edema Breast: Right mastectomy with reconstruction with a palpable nodule in the anterior axilla that is tender All other systems were reviewed with the patient and are negative.  I have reviewed the past medical history, past surgical history, social history and family history with the patient and they are unchanged from previous note.  ALLERGIES:  is allergic to sulfa antibiotics.  MEDICATIONS:  Current Outpatient Prescriptions  Medication Sig Dispense Refill  . Black Pepper-Turmeric 3-500 MG CAPS Take 1 capsule by mouth daily.    . Ginger, Zingiber officinalis, (GINGER ROOT PO) Take 1 tablet by mouth daily.    Marland Kitchen HYDROcodone-acetaminophen (NORCO/VICODIN) 5-325 MG tablet Take 1-2 tablets by mouth every 4 (four) hours as needed for moderate pain. 30 tablet 0  . methocarbamol (ROBAXIN) 500 MG tablet Take 1 tablet (500 mg total) by mouth every 8 (eight) hours as needed for muscle spasms. 30 tablet 0  . Multiple Vitamins-Minerals (MULTIVITAMIN) tablet Take 1 tablet by mouth daily.    . Probiotic Product (PROBIOTIC PO) Take 1 tablet by mouth daily.     No current facility-administered medications for this visit.     PHYSICAL EXAMINATION: ECOG PERFORMANCE STATUS: 1 - Symptomatic but completely ambulatory  Vitals:   02/20/17 1420  BP: (!)  169/89  Pulse: 99  Temp: 99.1 F (37.3 C)   Filed Weights   02/20/17 1420  Weight: 201 lb 6.4 oz (91.4 kg)    GENERAL:alert, no distress and comfortable SKIN: skin color, texture, turgor are normal, no rashes or significant lesions EYES: normal, Conjunctiva are pink and non-injected, sclera clear OROPHARYNX:no exudate, no erythema and lips, buccal mucosa, and tongue normal  NECK: supple, thyroid normal size, non-tender, without nodularity LYMPH:  no palpable lymphadenopathy in the cervical, axillary or inguinal LUNGS: clear to auscultation and percussion with normal breathing effort HEART: regular rate & rhythm and no murmurs and no lower extremity edema ABDOMEN:abdomen soft, non-tender and normal bowel sounds MUSCULOSKELETAL:no cyanosis of digits and no clubbing  NEURO: alert & oriented x 3 with fluent speech, no focal motor/sensory deficits EXTREMITIES: No lower extremity edema BREAST: The right reconstructed breast with a small palpable nodule in the right anterior axillary fold. (exam performed in the presence of a chaperone)  LABORATORY DATA:  I have reviewed the data as listed   Chemistry      Component Value Date/Time   NA  141 05/21/2016 0945   K 3.9 05/21/2016 0945   CL 104 04/23/2016 1551   CO2 25 05/21/2016 0945   BUN 6.5 (L) 05/21/2016 0945   CREATININE 0.9 05/21/2016 0945      Component Value Date/Time   CALCIUM 9.1 05/21/2016 0945   ALKPHOS 92 05/21/2016 0945   AST 16 05/21/2016 0945   ALT 16 05/21/2016 0945   BILITOT 0.38 05/21/2016 0945       Lab Results  Component Value Date   WBC 6.7 05/21/2016   HGB 10.7 (L) 05/21/2016   HCT 33.2 (L) 05/21/2016   MCV 83.1 05/21/2016   PLT 293 05/21/2016   NEUTROABS 5.0 05/21/2016    ASSESSMENT & PLAN:  Breast cancer of upper-outer quadrant of right female breast Right breast invasive ductal carcinoma multifocal, multicentric disease with at least 8 tumors, 2.3 cm, 1.6 cm of the biggest tumors in addition 6  more tumors 1 cm or less spanning 13.7 cm, right axillary lymph node biopsy positive (ER 22%); primary tumor is ER 0%, PR 0%, HER-2 negative, Ki-67 77% and 73% respectively, grade 3 T3 N1 M0 equals stage IIIa, Grade 3 Rt mastectomy 07/28/15: Multifocal IDC 2 cm, 1.5 cm. 0.6 cm; 3/13 LN positive, T1c N1 M0 stage IIA, ER/PR HER-2 negative. 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 Mandible biopsy: Scant material, no malignancy  Because the lymph node had 22% ER/PR positivity, I recommended starting her on tamoxifen 20 mg daily.  Palpable lump in the right anterior axilla: There is a small tender lump in this location that is immobile. I recommended obtaining an ultrasound evaluation and a biopsy if necessary. Patient complains of the entire reconstructed breasts seems to be bothering her with pain and discomfort.  Return to clinic based on the biopsy/ultrasound evaluation.  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 No role for adjuvant therapy   I spent 25 minutes talking to the patient of which more than half was spent in counseling and coordination of care.  No orders of the defined types were placed in this encounter.  The patient has a good understanding of the overall plan. she agrees with it. she will call with any problems that may develop before the next visit here.   Rulon Eisenmenger, MD 02/20/17

## 2017-02-20 NOTE — Progress Notes (Signed)
w

## 2017-02-20 NOTE — Assessment & Plan Note (Addendum)
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 0.6 cm; 3/13 LN positive, T1c N1 M0 stage IIA, ER/PR HER-2 negative. 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 Mandible biopsy: Scant material, no malignancy  Because the lymph node had 22% ER/PR positivity, I recommended starting her on tamoxifen 20 mg daily.  Palpable lump in the right anterior axilla: There is a small tender lump in this location that is immobile. I recommended obtaining an ultrasound evaluation and a biopsy if necessary. Patient complains of the entire reconstructed breasts seems to be bothering her with pain and discomfort.  Return to clinic based on the biopsy/ultrasound evaluation.  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 No role for adjuvant therapy 

## 2017-02-21 ENCOUNTER — Ambulatory Visit
Admission: RE | Admit: 2017-02-21 | Discharge: 2017-02-21 | Disposition: A | Discharge status: Home / Self Care | Payer: 59 | Source: Ambulatory Visit | Attending: Hematology and Oncology | Admitting: Hematology and Oncology

## 2017-02-21 ENCOUNTER — Other Ambulatory Visit: Payer: Self-pay | Admitting: Medical Oncology

## 2017-02-21 ENCOUNTER — Other Ambulatory Visit: Payer: Self-pay | Admitting: Hematology and Oncology

## 2017-02-21 ENCOUNTER — Telehealth: Payer: Self-pay | Admitting: Medical Oncology

## 2017-02-21 ENCOUNTER — Telehealth: Payer: Self-pay

## 2017-02-21 DIAGNOSIS — R2231 Localized swelling, mass and lump, right upper limb: Secondary | ICD-10-CM

## 2017-02-21 DIAGNOSIS — C50411 Malignant neoplasm of upper-outer quadrant of right female breast: Secondary | ICD-10-CM

## 2017-02-21 DIAGNOSIS — Z170 Estrogen receptor positive status [ER+]: Principal | ICD-10-CM

## 2017-02-21 NOTE — Telephone Encounter (Signed)
Patient called to inform me that it is her spouse's birthday on the 18th and the 24 month PREVENT appointments would not work. Informed patient that we can change them and I will call her again with new appointment date and times. Patient thanked me.  Adele Dan, RN, BSN Clinical Research 02/21/2017 9:16 AM

## 2017-02-21 NOTE — Telephone Encounter (Signed)
Spoke with patient per email from [facility] that patient needed to r/s her 5/18 appts to 5/24,[facility] r/s her MRI and I r/s her lab and visit with Mira. Pt aware of all new dates and times.

## 2017-02-25 NOTE — Assessment & Plan Note (Addendum)
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 0.6 cm; 3/13 LN positive, T1c N1 M0 stage IIA, ER/PR HER-2 negative. 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 Mandible biopsy: Scant material, no malignancy  Palpable lump in the right anterior axilla: There is a small tender lump in this location that is immobile.   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 Surgical evaluation for resection 2. PET/CT scan 3. Systemic chemotherapy

## 2017-02-26 ENCOUNTER — Encounter: Payer: Self-pay | Admitting: Hematology and Oncology

## 2017-02-26 ENCOUNTER — Ambulatory Visit (HOSPITAL_BASED_OUTPATIENT_CLINIC_OR_DEPARTMENT_OTHER): Payer: 59 | Admitting: Hematology and Oncology

## 2017-02-26 ENCOUNTER — Other Ambulatory Visit: Payer: Self-pay | Admitting: *Deleted

## 2017-02-26 DIAGNOSIS — C642 Malignant neoplasm of left kidney, except renal pelvis: Secondary | ICD-10-CM | POA: Diagnosis not present

## 2017-02-26 DIAGNOSIS — G620 Drug-induced polyneuropathy: Secondary | ICD-10-CM | POA: Diagnosis not present

## 2017-02-26 DIAGNOSIS — Z170 Estrogen receptor positive status [ER+]: Principal | ICD-10-CM

## 2017-02-26 DIAGNOSIS — R635 Abnormal weight gain: Secondary | ICD-10-CM

## 2017-02-26 DIAGNOSIS — C50911 Malignant neoplasm of unspecified site of right female breast: Secondary | ICD-10-CM

## 2017-02-26 DIAGNOSIS — C773 Secondary and unspecified malignant neoplasm of axilla and upper limb lymph nodes: Secondary | ICD-10-CM | POA: Diagnosis not present

## 2017-02-26 DIAGNOSIS — C50411 Malignant neoplasm of upper-outer quadrant of right female breast: Secondary | ICD-10-CM | POA: Diagnosis not present

## 2017-02-26 DIAGNOSIS — Z171 Estrogen receptor negative status [ER-]: Secondary | ICD-10-CM

## 2017-02-26 DIAGNOSIS — Z853 Personal history of malignant neoplasm of breast: Secondary | ICD-10-CM | POA: Diagnosis not present

## 2017-02-26 MED ORDER — BENAZEPRIL HCL 10 MG PO TABS: 10.0000 mg | ORAL_TABLET | Freq: Every day | ORAL | Status: DC

## 2017-02-26 NOTE — Progress Notes (Signed)
Patient Care Team: [name], MD as PCP - General (Family Medicine) Magrinat, Virgie Dad, MD as Consulting Physician (Oncology) Thea Silversmith, MD (Inactive) as Consulting Physician (Radiation Oncology) Mauro Kaufmann, RN as Registered Nurse Rockwell Germany, RN as Registered Nurse Alphonsa Overall, MD as Consulting Physician (General Surgery) Sylvan Cheese, NP as Nurse Practitioner (Hematology and Oncology)  DIAGNOSIS:  Encounter Diagnosis  Name Primary?  . Malignant neoplasm of upper-outer quadrant of right breast in female, estrogen receptor positive ([place])     SUMMARY OF ONCOLOGIC HISTORY:   Breast cancer of upper-outer quadrant of right female breast ([place])   01/19/2015 Mammogram    Right breast: density with indistinct margin at 12:00, posterior depth; also oval mass with obscured margin at 11:00, middle depth; irregular mass with indistinct margin central to nipple, middle depth      01/23/2015 Initial Biopsy    Right breast 9:00 and 10:00 biopsy ([facility]): Grade 3 IDC ER/PR negative, HER-2 negative, Ki-67 77% and 73%; the right axillary lymph node biopsy: ER 22% positive      01/30/2015 Breast MRI    Right breast masses 9 and 10:00 position, additional irregular masses with non-mass enhancement upper outer quadrant with one mass extending into upper inner quadrant, multifocal, multicentric 13.7 x 4.6 x 4.6 cm, malignant right axillary lymph node      02/08/2015 Clinical Stage    Stage IIIA: T3, N1, M0      02/09/2015 Procedure    BreastNext panel revealed VUS BRCA2 gene called p.E1250G (c.3749A>G); otherwise no clinically significant variant at ATM, BARD1, BRCA1, BRCA2, BRIP1, CDH1, CHEK2, MRE11A, MUTYH, NBN, NF1, PALB2, PTEN, RAD50, RAD51C, RAD51D, and TP53      02/15/2015 Imaging    No evidence of metastatic disease, 2.2 cm enhancing lesion anterior left lower kidney suspicious for solid renal neoplasm      02/20/2015 - 07/10/2015 Neo-Adjuvant Chemotherapy   dose dense doxorubicin and cyclophosphamide x 4 followed by Abraxane weekly x 12      07/10/2015 Breast MRI    Multiple irregular enhancing masses right breast are less confluent and smaller dominant mass 10:00 2.1 x 1.9 x 1.3 cm; right breast 11:00 mass measures 1.2 x 1.2 x 0.8 cm, decreased right axillary lymph node thickening      07/28/2015 Definitive Surgery    Rt mastectomy: Multifocal IDC 2 cm, 1.5 cm. 0.6 cm; 3/13 LN positive, ER/PR HER-2      07/28/2015 Pathologic Stage    Stage IIA: T1c N1 M0        09/03/2015 - 10/13/2015 Radiation Therapy    Adjuvant radiation therapy: Right chest wall/50.4 Pearline Cables @ 1.8 Gray per fraction x 28 fractions. Right supraclavicular fossa 45 Gy '@1'$ .8 Gy per fraction x 25 fractions      11/15/2015 - 05/10/2016 Chemotherapy    Adjuvant capecitabine - completed 8 cycles      11/16/2015 Survivorship    Survivorship visit completed and copy of care plan given to patient      02/23/2016 Procedure    Left mandible biopsy: Scant material, no malignancy      05/21/2016 -  Anti-estrogen oral therapy    Patient did not take tamoxifen because her ER was weakly positive at 22% and she was worried about toxicities      02/21/2017 Imaging    Rt Axilla 8 X 7 X 7 mm mass inv pectoralis musculature, additional mass 9 mm 1.5 cm apart      02/21/2017 Relapse/Recurrence  Right axillary mass biopsy: 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       Renal cell carcinoma of left kidney ([facility])   01/29/2016 Surgery    Left renal mass partial resection: 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 This was evaluated by ultrasound and was felt to be 2 nodules separated by 1.5 cm one of the nodules is invading the pectoralis muscle. The nodules were 8 mm and 9 mm. Biopsy of this nodule came back as metastatic ER/PR negative breast cancer. HER-2 is pending but likely negative given the prior cancer was HER-2 negative. She is here today accompanied by her husband to discuss these results and come up with the treatment plan.  REVIEW OF SYSTEMS:   Constitutional: Denies fevers, chills or abnormal weight loss Eyes: Denies blurriness of vision Ears, nose, mouth, throat, and face: Denies mucositis or sore throat Respiratory: Denies cough, dyspnea or wheezes Cardiovascular: Denies palpitation, chest discomfort Gastrointestinal:  Denies nausea, heartburn or change in bowel habits Skin: Denies abnormal skin rashes Lymphatics: Denies new lymphadenopathy or easy bruising Neurological: Right arm pain Behavioral/Psych: Mood is stable, no new changes  Extremities: No lower extremity edema Breast: Pain in the right axilla radiates from bottom of the neck all the way down her arm All other systems were reviewed with the patient and are negative.  I have reviewed the past medical history, past surgical history, social history and family history with the patient and they are unchanged from previous note.  ALLERGIES:  is allergic to sulfa antibiotics.  MEDICATIONS:  Current Outpatient Prescriptions  Medication Sig Dispense Refill  . Black Pepper-Turmeric 3-500 MG CAPS Take 1 capsule by mouth daily.    . Ginger, Zingiber officinalis, (GINGER ROOT PO) Take 1 tablet by mouth daily.    Marland Kitchen HYDROcodone-acetaminophen (NORCO/VICODIN) 5-325 MG tablet Take 1-2 tablets by mouth every 4 (four) hours as needed for moderate pain. 30 tablet 0  . methocarbamol (ROBAXIN) 500 MG tablet Take 1 tablet (500 mg total)  by mouth every 8 (eight) hours as needed for muscle spasms. 30 tablet 0  . Multiple Vitamins-Minerals (MULTIVITAMIN) tablet Take 1 tablet by mouth daily.    . Probiotic Product (PROBIOTIC PO) Take 1 tablet by mouth daily.     No current facility-administered medications for this visit.     PHYSICAL EXAMINATION: ECOG PERFORMANCE STATUS: 1 - Symptomatic but completely ambulatory  Vitals:   02/26/17 0812  BP: (!) 142/89  Pulse: 99  Resp: 20  Temp: 98.5 F (36.9 C)   Filed Weights   02/26/17 0812  Weight: 197 lb 8 oz (89.6 kg)    GENERAL:alert, no distress and comfortable SKIN: skin color, texture, turgor are normal, no rashes or significant lesions EYES: normal, Conjunctiva are pink and non-injected, sclera clear OROPHARYNX:no exudate, no erythema and lips, buccal mucosa, and tongue normal  NECK: supple, thyroid normal size, non-tender, without nodularity LYMPH:  no palpable lymphadenopathy in the cervical, axillary or inguinal LUNGS: clear to auscultation and percussion with normal breathing effort HEART: regular rate & rhythm  and no murmurs and no lower extremity edema ABDOMEN:abdomen soft, non-tender and normal bowel sounds MUSCULOSKELETAL:no cyanosis of digits and no clubbing  NEURO: alert & oriented x 3 with fluent speech, radiating pain down the right arm EXTREMITIES: No lower extremity edema  LABORATORY DATA:  I have reviewed the data as listed   Chemistry      Component Value Date/Time   NA 141 05/21/2016 0945   K 3.9 05/21/2016 0945   CL 104 04/23/2016 1551   CO2 25 05/21/2016 0945   BUN 6.5 (L) 05/21/2016 0945   CREATININE 0.9 05/21/2016 0945      Component Value Date/Time   CALCIUM 9.1 05/21/2016 0945   ALKPHOS 92 05/21/2016 0945   AST 16 05/21/2016 0945   ALT 16 05/21/2016 0945   BILITOT 0.38 05/21/2016 0945       Lab Results  Component Value Date   WBC 6.7 05/21/2016   HGB 10.7 (L) 05/21/2016   HCT 33.2 (L) 05/21/2016   MCV 83.1 05/21/2016    PLT 293 05/21/2016   NEUTROABS 5.0 05/21/2016    ASSESSMENT & PLAN:  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 0.6 cm; 3/13 LN positive, T1c N1 M0 stage IIA, ER/PR HER-2 negative. 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 Mandible biopsy: Scant material, no malignancy  Palpable lump in the right anterior axilla: There is a small tender lump in this location that is immobile.   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 Surgical evaluation for resection 2. PET/CT scan 3. Systemic chemotherapy with carboplatin and gemcitabine given on days 1 and 8 every 3 weeks was discussed briefly. Patient wishes to undergo surgery if it was possible up front. I spoke with Dr. Lucia Gaskins extensively about her case and we felt that once we have these images that we can present her the tumor board and make a final decision. If she were to need chemotherapy she would need a new port placement. Patient was extremely upset about getting another port and she might decide to do the treatment with the peripheral veins. 4. Biopsy of the second nodule should also be done.  I will see her back next week to discuss the results of the scans and  biopsies. We will do our best to expedite this workup.  I spent 25 minutes talking to the patient of which more than half was spent in counseling and coordination of care.  Orders Placed This Encounter  Procedures  . MR BREAST BILATERAL W WO CONTRAST    Standing Status:   Future    Standing Expiration Date:   04/28/2018    Order Specific Question:   If indicated for the ordered procedure, I authorize the administration  of contrast media per Radiology protocol    Answer:   Yes    Order Specific Question:   Reason for Exam (SYMPTOM  OR DIAGNOSIS REQUIRED)    Answer:   Relapsed the right axillary breast cancer before neoadjuvant chemotherapy evaluation    Order Specific Question:   Preferred imaging location?    Answer:   [facility] (table limit-350 lbs)    Order Specific Question:   Does the patient have a pacemaker or implanted devices?    Answer:   No    Order Specific Question:   What is the patient's sedation requirement?    Answer:   No Sedation  . NM PET Image Restag (PS) Skull Base To Thigh    Standing Status:   Future    Standing Expiration Date:   02/26/2018    Order Specific Question:   Reason for Exam (SYMPTOM  OR DIAGNOSIS REQUIRED)    Answer:   Relapse triple negative breast cancer with pain in the right neck and arm    Order Specific Question:   If indicated for the ordered procedure, I authorize the administration of a radiopharmaceutical per Radiology protocol    Answer:   Yes    Order Specific Question:   Is the patient pregnant?    Answer:   No    Order Specific Question:   Preferred imaging location?    Answer:   [facility]    Order Specific Question:   Radiology Contrast Protocol - do NOT remove file path    Answer:   [email or web address]   The patient has a good understanding of the overall plan. she agrees with it. she will call with any problems that may develop before the next visit here.   Rulon Eisenmenger,  MD 02/26/17

## 2017-02-27 ENCOUNTER — Other Ambulatory Visit: Payer: Self-pay | Admitting: Hematology and Oncology

## 2017-02-27 ENCOUNTER — Ambulatory Visit
Admission: RE | Admit: 2017-02-27 | Discharge: 2017-02-27 | Disposition: A | Discharge status: Home / Self Care | Payer: 59 | Source: Ambulatory Visit | Attending: Hematology and Oncology | Admitting: Hematology and Oncology

## 2017-02-27 ENCOUNTER — Ambulatory Visit: Admission: RE | Admit: 2017-02-27 | Payer: 59 | Source: Ambulatory Visit

## 2017-02-27 ENCOUNTER — Telehealth: Payer: Self-pay | Admitting: *Deleted

## 2017-02-27 DIAGNOSIS — C50911 Malignant neoplasm of unspecified site of right female breast: Secondary | ICD-10-CM

## 2017-02-27 NOTE — Telephone Encounter (Signed)
Spoke with patient and informed her that I was able to get her MRI breast moved to 5/19 at 345pm.  Her PET is 5/21 and she is aware of that appt.

## 2017-02-28 ENCOUNTER — Encounter: Payer: 59 | Admitting: Medical Oncology

## 2017-02-28 ENCOUNTER — Other Ambulatory Visit: Payer: 59

## 2017-02-28 ENCOUNTER — Inpatient Hospital Stay (HOSPITAL_COMMUNITY): Admission: RE | Admit: 2017-02-28 | Payer: 59 | Source: Ambulatory Visit

## 2017-03-01 ENCOUNTER — Ambulatory Visit (HOSPITAL_COMMUNITY)
Admission: RE | Admit: 2017-03-01 | Discharge: 2017-03-01 | Disposition: A | Discharge status: Home / Self Care | Payer: 59 | Source: Ambulatory Visit | Attending: Hematology and Oncology | Admitting: Hematology and Oncology

## 2017-03-01 ENCOUNTER — Other Ambulatory Visit (HOSPITAL_COMMUNITY): Payer: Self-pay | Admitting: Radiology

## 2017-03-01 ENCOUNTER — Other Ambulatory Visit: Payer: Self-pay | Admitting: Hematology and Oncology

## 2017-03-01 DIAGNOSIS — C50411 Malignant neoplasm of upper-outer quadrant of right female breast: Secondary | ICD-10-CM | POA: Insufficient documentation

## 2017-03-01 DIAGNOSIS — Z170 Estrogen receptor positive status [ER+]: Secondary | ICD-10-CM | POA: Diagnosis not present

## 2017-03-01 LAB — POCT I-STAT CREATININE: Creatinine, Ser: 0.7 mg/dL (ref 0.44–1.00)

## 2017-03-01 MED ORDER — GADOBENATE DIMEGLUMINE 529 MG/ML IV SOLN: 20.0000 mL | Freq: Once | INTRAVENOUS | Status: DC | PRN

## 2017-03-03 ENCOUNTER — Encounter (HOSPITAL_COMMUNITY): Payer: Self-pay | Admitting: Radiology

## 2017-03-03 ENCOUNTER — Encounter (HOSPITAL_COMMUNITY)
Admission: RE | Admit: 2017-03-03 | Discharge: 2017-03-03 | Disposition: A | Discharge status: Home / Self Care | Payer: 59 | Source: Ambulatory Visit | Attending: Hematology and Oncology | Admitting: Hematology and Oncology

## 2017-03-03 DIAGNOSIS — Z170 Estrogen receptor positive status [ER+]: Secondary | ICD-10-CM | POA: Insufficient documentation

## 2017-03-03 DIAGNOSIS — C50411 Malignant neoplasm of upper-outer quadrant of right female breast: Secondary | ICD-10-CM | POA: Insufficient documentation

## 2017-03-03 LAB — GLUCOSE, CAPILLARY: Glucose-Capillary: 121 mg/dL — ABNORMAL HIGH (ref 65–99)

## 2017-03-03 MED ORDER — FLUDEOXYGLUCOSE F - 18 (FDG) INJECTION
9.8000 | Freq: Once | INTRAVENOUS | Status: AC | PRN
  Administered 2017-03-03: 9.8 via INTRAVENOUS

## 2017-03-04 NOTE — Assessment & Plan Note (Signed)
Right breast invasive ductal carcinoma multifocal, multicentric disease with at least 8 tumors, 2.3 cm, 1.6 cm of the biggest tumors in addition 6 more tumors 1 cm or less spanning 13.7 cm, right axillary lymph node biopsy positive (ER 22%); primary tumor is ER 0%, PR 0%, HER-2 negative, Ki-67 77% and 73% respectively, grade 3 T3 N1 M0 equals stage IIIa, Grade 3 1. Rt mastectomy 07/28/15: Multifocal IDC 2 cm, 1.5 cm. 0.6 cm; 3/13 LN positive, T1c N1 M0 stage IIA, ER/PR HER-2 negative. 2. Adjuvant XRT 09/03/15 to 10/13/15 3. Adjuvant Xeloda X 6 months (8 cycles) started 11/15/2015 completed July 2017 ----------------------------------------------------------------------- PET-CT scan 03/03/17:  Hypermet rec tumor in right pectoralis musculature, Rt Hilar, Rt Para tracheal and Rt Int Mamm LN mets, Solit Left scapular mets, Sol 8 mm lesion in Rt liver mets, Several sub cm lung nodules.  Rt Axillary LN Biopsy: Triple negative right breast cancer Radiology Review: I discussed with the patient that she has metastatic disease.  Plan: 1. Palliative systemic chemo with Carboplatin-Gemcitabine Days 1 and 8 q 3 weeks Goals of treatment: Shrink the cancers and prolong life. I discussed with her that we cannot cure stage 4 cancer

## 2017-03-05 ENCOUNTER — Ambulatory Visit (HOSPITAL_BASED_OUTPATIENT_CLINIC_OR_DEPARTMENT_OTHER): Payer: 59 | Admitting: Hematology and Oncology

## 2017-03-05 ENCOUNTER — Encounter: Payer: Self-pay | Admitting: Hematology and Oncology

## 2017-03-05 VITALS — BP 171/96 | HR 105 | Temp 98.2°F | Resp 18 | Ht 65.0 in | Wt 195.1 lb

## 2017-03-05 DIAGNOSIS — C642 Malignant neoplasm of left kidney, except renal pelvis: Secondary | ICD-10-CM

## 2017-03-05 DIAGNOSIS — C50411 Malignant neoplasm of upper-outer quadrant of right female breast: Secondary | ICD-10-CM

## 2017-03-05 DIAGNOSIS — C7989 Secondary malignant neoplasm of other specified sites: Secondary | ICD-10-CM

## 2017-03-05 DIAGNOSIS — C773 Secondary and unspecified malignant neoplasm of axilla and upper limb lymph nodes: Secondary | ICD-10-CM | POA: Diagnosis not present

## 2017-03-05 DIAGNOSIS — Z170 Estrogen receptor positive status [ER+]: Secondary | ICD-10-CM

## 2017-03-05 NOTE — Progress Notes (Signed)
Patient Care Team: [name], MD as PCP - General (Family Medicine) Magrinat, Virgie Dad, MD as Consulting Physician (Oncology) Thea Silversmith, MD (Inactive) as Consulting Physician (Radiation Oncology) Mauro Kaufmann, RN as Registered Nurse Rockwell Germany, RN as Registered Nurse Alphonsa Overall, MD as Consulting Physician (General Surgery) Sylvan Cheese, NP as Nurse Practitioner (Hematology and Oncology)  DIAGNOSIS:  Encounter Diagnoses  Name Primary?  . Malignant neoplasm of upper-outer quadrant of right breast in female, estrogen receptor positive ([place])   . Renal cell carcinoma of left kidney (H[facility]) Yes    SUMMARY OF ONCOLOGIC HISTORY:   Breast cancer of upper-outer quadrant of right female breast ([place])   01/19/2015 Mammogram    Right breast: density with indistinct margin at 12:00, posterior depth; also oval mass with obscured margin at 11:00, middle depth; irregular mass with indistinct margin central to nipple, middle depth      01/23/2015 Initial Biopsy    Right breast 9:00 and 10:00 biopsy ([facility]): Grade 3 IDC ER/PR negative, HER-2 negative, Ki-67 77% and 73%; the right axillary lymph node biopsy: ER 22% positive      01/30/2015 Breast MRI    Right breast masses 9 and 10:00 position, additional irregular masses with non-mass enhancement upper outer quadrant with one mass extending into upper inner quadrant, multifocal, multicentric 13.7 x 4.6 x 4.6 cm, malignant right axillary lymph node      02/08/2015 Clinical Stage    Stage IIIA: T3, N1, M0      02/09/2015 Procedure    BreastNext panel revealed VUS BRCA2 gene called p.E1250G (c.3749A>G); otherwise no clinically significant variant at ATM, BARD1, BRCA1, BRCA2, BRIP1, CDH1, CHEK2, MRE11A, MUTYH, NBN, NF1, PALB2, PTEN, RAD50, RAD51C, RAD51D, and TP53      02/15/2015 Imaging    No evidence of metastatic disease, 2.2 cm enhancing lesion anterior left lower kidney suspicious for solid renal neoplasm      02/20/2015 - 07/10/2015 Neo-Adjuvant Chemotherapy    dose dense doxorubicin and cyclophosphamide x 4 followed by Abraxane weekly x 12      07/10/2015 Breast MRI    Multiple irregular enhancing masses right breast are less confluent and smaller dominant mass 10:00 2.1 x 1.9 x 1.3 cm; right breast 11:00 mass measures 1.2 x 1.2 x 0.8 cm, decreased right axillary lymph node thickening      07/28/2015 Definitive Surgery    Rt mastectomy: Multifocal IDC 2 cm, 1.5 cm. 0.6 cm; 3/13 LN positive, ER/PR HER-2      07/28/2015 Pathologic Stage    Stage IIA: T1c N1 M0        09/03/2015 - 10/13/2015 Radiation Therapy    Adjuvant radiation therapy: Right chest wall/50.4 Pearline Cables @ 1.8 Gray per fraction x 28 fractions. Right supraclavicular fossa 45 Gy '@1'$ .8 Gy per fraction x 25 fractions      11/15/2015 - 05/10/2016 Chemotherapy    Adjuvant capecitabine - completed 8 cycles      11/16/2015 Survivorship    Survivorship visit completed and copy of care plan given to patient      02/23/2016 Procedure    Left mandible biopsy: Scant material, no malignancy      05/21/2016 -  Anti-estrogen oral therapy    Patient did not take tamoxifen because her ER was weakly positive at 22% and she was worried about toxicities      02/21/2017 Imaging    Rt Axilla 8 X 7 X 7 mm mass inv pectoralis musculature, additional mass 9 mm 1.5  cm apart      02/21/2017 Relapse/Recurrence    Right axillary mass biopsy: 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 Biopsy of this lesion revealed recurrence of triple negative breast cancer. She underwent a PET/CT scan and is here to discuss the results. The PET scan showed evidence of distant metastatic disease. We presented her case in the tumor board and she is here today to discuss treatment options. Her biggest symptom is right arm pain most likely because of tumor that is infiltrating the brachial plexus.  REVIEW OF SYSTEMS:   Constitutional: Denies fevers, chills or abnormal weight loss Eyes: Denies blurriness of vision Ears, nose, mouth, throat, and face: Denies mucositis or sore throat Respiratory: Denies cough, dyspnea or wheezes Cardiovascular: Denies palpitation, chest discomfort Gastrointestinal:  Denies nausea, heartburn or change in bowel habits Skin: Denies abnormal skin rashes Lymphatics: Denies new lymphadenopathy or easy bruising Neurological:Denies numbness, tingling or new weaknesses Behavioral/Psych: Mood is stable, no new changes  Extremities: Right arm pain Breast: Right-sided chest wall lesion in the pectoral muscle All other systems were reviewed with the patient and are negative.  I have reviewed the past medical history, past surgical history, social history and family history with the patient and they are unchanged from previous note.  ALLERGIES:  is allergic to sulfa antibiotics.  MEDICATIONS:  Current Outpatient Prescriptions  Medication Sig Dispense Refill  . benazepril (LOTENSIN) 10 MG tablet Take 1 tablet (10 mg total) by mouth daily.    . Black Pepper-Turmeric 3-500 MG CAPS Take 1 capsule by mouth daily.    . Ginger, Zingiber officinalis, (GINGER ROOT PO) Take 1 tablet by mouth daily.    Marland Kitchen HYDROcodone-acetaminophen (NORCO/VICODIN) 5-325 MG tablet Take 1-2 tablets by mouth every 4 (four) hours as needed for moderate pain. 30 tablet 0  . methocarbamol (ROBAXIN) 500 MG tablet Take 1 tablet (500 mg total) by mouth every 8 (eight) hours as  needed for muscle spasms. 30 tablet 0  . Multiple Vitamins-Minerals (MULTIVITAMIN) tablet Take 1 tablet by mouth daily.    . Probiotic Product (PROBIOTIC PO) Take 1 tablet by mouth daily.     No current facility-administered medications for this visit.     PHYSICAL EXAMINATION: ECOG PERFORMANCE STATUS: 1 - Symptomatic but completely ambulatory  Vitals:   03/05/17 1113  BP: (!) 171/96  Pulse: (!) 105  Resp: 18  Temp: 98.2 F (36.8 C)   Filed Weights   03/05/17 1113  Weight: 195 lb 1.6 oz (88.5 kg)    GENERAL:alert, no distress and comfortable SKIN: skin color, texture, turgor are normal, no rashes or significant lesions EYES: normal, Conjunctiva are pink and non-injected, sclera clear OROPHARYNX:no exudate, no erythema and lips, buccal mucosa, and tongue normal  NECK: supple, thyroid normal size, non-tender, without nodularity LYMPH:  no palpable lymphadenopathy in the cervical, axillary or inguinal LUNGS: clear to auscultation and percussion with normal breathing effort HEART: regular rate & rhythm and no murmurs  and no lower extremity edema ABDOMEN:abdomen soft, non-tender and normal bowel sounds MUSCULOSKELETAL:no cyanosis of digits and no clubbing  NEURO: alert & oriented x 3 with fluent speech, slight numbness in the right arm EXTREMITIES: No lower extremity edema  LABORATORY DATA:  I have reviewed the data as listed   Chemistry      Component Value Date/Time   NA 141 05/21/2016 0945   K 3.9 05/21/2016 0945   CL 104 04/23/2016 1551   CO2 25 05/21/2016 0945   BUN 6.5 (L) 05/21/2016 0945   CREATININE 0.70 03/01/2017 1345   CREATININE 0.9 05/21/2016 0945      Component Value Date/Time   CALCIUM 9.1 05/21/2016 0945   ALKPHOS 92 05/21/2016 0945   AST 16 05/21/2016 0945   ALT 16 05/21/2016 0945   BILITOT 0.38 05/21/2016 0945       Lab Results  Component Value Date   WBC 6.7 05/21/2016   HGB 10.7 (L) 05/21/2016   HCT 33.2 (L) 05/21/2016   MCV 83.1  05/21/2016   PLT 293 05/21/2016   NEUTROABS 5.0 05/21/2016    ASSESSMENT & PLAN:  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 Rt mastectomy 07/28/15: Multifocal IDC 2 cm, 1.5 cm. 0.6 cm; 3/13 LN positive, T1c N1 M0 stage IIA, ER/PR HER-2 negative. 2. Adjuvant XRT 09/03/15 to 10/13/15 3. Adjuvant Xeloda X 6 months (8 cycles) started 11/15/2015 completed July 2017 ----------------------------------------------------------------------- PET-CT scan 03/03/17:  Hypermet rec tumor in right pectoralis musculature, Rt Hilar, Rt Para tracheal and Rt Int Mamm LN mets, Solit Left scapular mets, Sol 8 mm lesion in Rt liver mets, Several sub cm lung nodules.  Rt Axillary LN Biopsy: Triple negative right breast cancer Radiology Review: I discussed with the patient that she has metastatic disease.  Plan: I discussed with the patient different options including 1. Palliative systemic chemo with Carboplatin-Gemcitabine Days 1 and 8 q 3 weeks Goals of treatment: Shrink the cancers and prolong life. I discussed with her that we cannot cure stage 4 cancer 2. I also recommended that the patient see Dr. Narda Bonds at [facility] for triple negative clinical trial options. I'm not excited about giving her systemic chemotherapy given the fact that extensive chemotherapy did not prevent this recurrence.  Right arm pain: I called and discussed the case with Dr. Mitzi Hansen. He is willing to see her for palliative radiation.  I will see the patient back depending on the outcome of her visit at [facility].  I spent 25 minutes talking to the patient of which  more than half was spent in counseling and coordination of care.  Orders Placed This Encounter  Procedures  . Ambulatory referral to Oncology    Referral Priority:   Routine    Referral Type:   Consultation    Referral Reason:   Specialty Services Required    Referred to Provider:   Wylene Men    Requested Specialty:   Internal Medicine    Number of Visits Requested:   1   The patient has a good understanding of the overall plan. she agrees with it.  she will call with any problems that may develop before the next visit here.   Rulon Eisenmenger, MD 03/05/17

## 2017-03-06 ENCOUNTER — Telehealth: Payer: Self-pay | Admitting: Medical Oncology

## 2017-03-06 ENCOUNTER — Ambulatory Visit (HOSPITAL_COMMUNITY): Admission: RE | Admit: 2017-03-06 | Payer: 59 | Source: Ambulatory Visit

## 2017-03-06 ENCOUNTER — Encounter: Payer: 59 | Admitting: Medical Oncology

## 2017-03-06 ENCOUNTER — Other Ambulatory Visit: Payer: Self-pay | Admitting: *Deleted

## 2017-03-06 ENCOUNTER — Telehealth: Payer: Self-pay | Admitting: *Deleted

## 2017-03-06 ENCOUNTER — Telehealth: Payer: Self-pay

## 2017-03-06 ENCOUNTER — Other Ambulatory Visit: Payer: 59

## 2017-03-06 DIAGNOSIS — C50411 Malignant neoplasm of upper-outer quadrant of right female breast: Secondary | ICD-10-CM

## 2017-03-06 DIAGNOSIS — Z171 Estrogen receptor negative status [ER-]: Principal | ICD-10-CM

## 2017-03-06 MED ORDER — OXYCODONE-ACETAMINOPHEN 5-325 MG PO TABS: 1.0000 | ORAL_TABLET | Freq: Four times a day (QID) | ORAL | 0 refills | Status: DC | PRN

## 2017-03-06 NOTE — Telephone Encounter (Signed)
Called and left new appt times with patient for 6/1 per [place]/email   Phyllis Pruitt

## 2017-03-06 NOTE — Telephone Encounter (Signed)
PREVENT Call to patient due to missed cardiac MRI appointment. Patient had her appointment times confused. Informed patient not to worry and that we can reschedule all her appointments from today to another day. Patient states she can come in the mornings next week. I thanked patient and informed her will contact her with new times. Patient encouraged to call Dr. [name] or myself with questions.  Adele Dan, RN, BSN Clinical Research 03/06/2017 1:50 PM

## 2017-03-06 NOTE — Telephone Encounter (Signed)
Received call from patient stating she needs something for pain for her arm.  Rx written for percocet per Dr. [name]. Also informed her that Dr. [name] spoke with Dr. Lisbeth Renshaw and it looks like she could get some palliative radiation to her arm and axilla.  She will pick up the rx

## 2017-03-07 ENCOUNTER — Ambulatory Visit (HOSPITAL_COMMUNITY): Payer: 59

## 2017-03-07 NOTE — Progress Notes (Addendum)
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 Lymph node, needle/core biopsy, right axilla, medial mass #2 (mass #1 within the medial right axilla was biopsied last week) - SOFT TISSUE WITH INVOLVEMENT BY CARCINOMA, SEE COMMENT. 2. Lymph node, needle/core biopsy, right axilla, lateral mass #3 - SKELETAL MUSCLE WITH INVOLVEMENT BY CARCINOMA, SEE COMMENT.  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  Past/Anticipated interventions by surgeon, if any:02-27-17 Lymph node, needle/core biopsy, right axilla, medial mass #2, 02-21-17   Soft Tissue Needle Core Biopsy, medial right axilla,07-28-15 Right modified radical mastectomy Dr. Lucia Gaskins,   Past/Anticipated interventions by medical oncology, if any:03-05-17 Dr. [name] I will see the patient back depending recommended that the patient see Dr. Harriett Rush at [facility] for triple negative clinical trial options.  02-20-15 -07-09-05 dose dense doxorubicin and cyclophosphamide x 4 followed by Abraxane weekly x 12     Lymphedema issues, if any: Yes wearing a sleeve  Pain issues, if any: 8/10 Right arm and fingers  SAFETY ISSUES:  Prior radiation?2016 Right breast  Pacemaker/ICD? No  Possible current pregnancy?:No  Is the patient on methotrexate? :No  Current Complaints / other details: 46 yr old with history of right breast triple negative cancer underwent mastectomny after neoadjuvant chemotherapy.  Grade 3 neuropathy in hands and feet. Removal of port a cath on 07-28-15. Wt Readings from Last 3 Encounters:  03/11/17  195 lb (88.5 kg)  03/05/17 195 lb 1.6 oz (88.5 kg)  03/01/17 197 lb (89.4 kg)  BP (!) 158/98   Pulse 95   Temp 98.5 F (36.9 C) (Oral)   Resp 18   Ht '5\' 5"'$  (1.651 m)   Wt 195 lb (88.5 kg)   LMP 02/22/2017   SpO2 100%   BMI 32.45 kg/m    Georgena Spurling, RN 03/07/2017,3:59 PM

## 2017-03-11 ENCOUNTER — Ambulatory Visit
Admission: RE | Admit: 2017-03-11 | Discharge: 2017-03-11 | Disposition: A | Discharge status: Home / Self Care | Payer: 59 | Source: Ambulatory Visit | Attending: Radiation Oncology | Admitting: Radiation Oncology

## 2017-03-11 ENCOUNTER — Ambulatory Visit (HOSPITAL_COMMUNITY): Payer: 59

## 2017-03-11 ENCOUNTER — Telehealth: Payer: Self-pay

## 2017-03-11 ENCOUNTER — Other Ambulatory Visit (HOSPITAL_COMMUNITY): Payer: 59

## 2017-03-11 VITALS — BP 158/98 | HR 95 | Temp 98.5°F | Resp 18 | Ht 65.0 in | Wt 195.0 lb

## 2017-03-11 DIAGNOSIS — C7951 Secondary malignant neoplasm of bone: Secondary | ICD-10-CM | POA: Insufficient documentation

## 2017-03-11 DIAGNOSIS — Z905 Acquired absence of kidney: Secondary | ICD-10-CM | POA: Insufficient documentation

## 2017-03-11 DIAGNOSIS — C50411 Malignant neoplasm of upper-outer quadrant of right female breast: Secondary | ICD-10-CM | POA: Insufficient documentation

## 2017-03-11 DIAGNOSIS — R918 Other nonspecific abnormal finding of lung field: Secondary | ICD-10-CM | POA: Diagnosis not present

## 2017-03-11 DIAGNOSIS — Z888 Allergy status to other drugs, medicaments and biological substances status: Secondary | ICD-10-CM | POA: Insufficient documentation

## 2017-03-11 DIAGNOSIS — R0789 Other chest pain: Secondary | ICD-10-CM | POA: Insufficient documentation

## 2017-03-11 DIAGNOSIS — Z85528 Personal history of other malignant neoplasm of kidney: Secondary | ICD-10-CM | POA: Insufficient documentation

## 2017-03-11 DIAGNOSIS — F329 Major depressive disorder, single episode, unspecified: Secondary | ICD-10-CM | POA: Diagnosis not present

## 2017-03-11 DIAGNOSIS — Z9889 Other specified postprocedural states: Secondary | ICD-10-CM | POA: Insufficient documentation

## 2017-03-11 DIAGNOSIS — Z923 Personal history of irradiation: Secondary | ICD-10-CM | POA: Insufficient documentation

## 2017-03-11 DIAGNOSIS — Z171 Estrogen receptor negative status [ER-]: Principal | ICD-10-CM

## 2017-03-11 DIAGNOSIS — C787 Secondary malignant neoplasm of liver and intrahepatic bile duct: Secondary | ICD-10-CM | POA: Insufficient documentation

## 2017-03-11 DIAGNOSIS — Z9011 Acquired absence of right breast and nipple: Secondary | ICD-10-CM | POA: Diagnosis not present

## 2017-03-11 DIAGNOSIS — D573 Sickle-cell trait: Secondary | ICD-10-CM | POA: Insufficient documentation

## 2017-03-11 DIAGNOSIS — Z9882 Breast implant status: Secondary | ICD-10-CM | POA: Diagnosis not present

## 2017-03-11 DIAGNOSIS — Z79899 Other long term (current) drug therapy: Secondary | ICD-10-CM | POA: Insufficient documentation

## 2017-03-11 DIAGNOSIS — Z510 Encounter for antineoplastic radiation therapy: Secondary | ICD-10-CM | POA: Diagnosis present

## 2017-03-11 MED ORDER — PREDNISONE 10 MG PO TABS: ORAL_TABLET | ORAL | 0 refills | Status: DC

## 2017-03-11 MED ORDER — GABAPENTIN 300 MG PO CAPS: 300.0000 mg | ORAL_CAPSULE | Freq: Three times a day (TID) | ORAL | 0 refills | Status: DC | PRN

## 2017-03-11 NOTE — Telephone Encounter (Signed)
Called pt to let her know that referral was requested and awaiting for all her medical records to be sent to [facility] office. Once they have received all her medical information, they will be calling pt to schedule an appt. Pt verbalized understanding. Advised pt to call our office if she doesn't hear anything by the end of the week. Sent referral request 03/05/17. Sent message to HIM to process referral.

## 2017-03-11 NOTE — Progress Notes (Signed)
Radiation Oncology         [phone] ________________________________  Name: Phyllis Pruitt MRN: [id number]  Date: 03/11/2017  DOB: [birth year]  [id number], Margaretha Sheffield, MD  Excell Seltzer, MD     REFERRING PHYSICIAN: Excell Seltzer, MD   DIAGNOSIS: The encounter diagnosis was Malignant neoplasm of upper-outer quadrant of right breast in female, estrogen receptor negative ([place]).   HISTORY OF PRESENT ILLNESS: Phyllis Pruitt is a 46 y.o. female seen at the request of Dr. 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 She completed right mastectomy on 07/28/15 which revealed multifocal ICD measuring 2 cm, 1.5 cm, and 52m. 3/13 nodes were positive, and the tumor was still triple negative. She received adjuvant therapy to the chest wall and supraclavicular fossa with minimal overlap of the axilla. She has been followed in surveillance but developed pain in the right chest wall and right hand, as well as palpable fullness along the right chest wall posterior to her implant. An axillary ultrasound on 02/21/17 which revealed an 8 x 7 x 7 mm hypoechoic mass in the 9 x 7 x 7 mm mass in the chest wall beneath the pectoralis musculature. Lesions were about 1.5 cm apart. The medial right lesion was biopsied on 02/21/2017 revealing metastatic carcinoma assistant with breast primary, and 2 additional nodes were biopsied on 02/27/2017 both involved with metastatic carcinoma again consistent with her known primary breast cancer. A PET scan on 03/03/2017 revealed poorly marginated area of hypermetabolic activity measuring 3.5 x 2.1 cm in the right axillary region consistent with her known tumor, as well as focal hypermetabolic change in the upper right pectoralis muscle with an SUV of 5.5. Mildly enlarged hypermetabolic right internal  mammary nodes were noted within the maxillary SUV of 7.6 and measured up to 1.2 cm. A mildly enlarged hypermetabolic 1 cm right paratracheal lymph node is also noted as well as some new solid pulmonary nodules in the right lung 6 mm in the right upper lobe and 6 mm in the right lower lobe. New disease is also noted in the liver with a 1.2 cm mass in the right lobe, and leg left hypermetabolic region in the scapula with SUV of 6.2 was also noted. No other hypermetabolic osseous lesions were noted. She comes today to discuss the options of palliative radiotherapy to the right chest wall/axilla.  PREVIOUS RADIATION THERAPY: Yes, 17 months ago  09/03/15-10/13/15: Right chest wall/50.4 GPearline Cables@ 1.8 Gray per fraction x 28 fractions Right supraclavicular fossa 45 Gy _0 .8 Gy per fraction x 25 fractions  PAST MEDICAL HISTORY:  Past Medical History:  Diagnosis Date  . Anemia    during pregnancy  . Breast [place] of [place]outer quadrant of right [place] breast [place] 01/27/[place]  . Depression    hx denies any problems now  . History of kidney cancer 12/2015   left removed "spot"  . Lymphedema    rt arm  . Neuropathy   . [place]ickle cell trait ([facility]        PAST SURGICAL HISTORY: Past Surgical History:  Procedure Laterality Date  . LATISSIMUS FLAP TO BREAST Right 06/03/2016   Procedure: RIGHT LATISSIMUS FLAP TO BREAST;  Surgeon: BIrene Limbo MD;  [place] [facility]  Service: Plastics;  Laterality: Right;  . LIPOSUCTION WITH LIPOFILLING Right 09/20/2016   Procedure: LIPOSUCTION WITH LIPOFILLING;  Surgeon:  Irene Limbo, MD;  Location: [place];  Service: Plastics;  Laterality: Right;  LIPOSUCTION WITH LIPOFILLING  . MASTECTOMY    . MASTECTOMY MODIFIED RADICAL Right 07/28/2015  . MASTECTOMY MODIFIED RADICAL Right 07/28/2015   Procedure: RIGHT MODIFIED RADICAL MASTECTOMY;  Surgeon: Alphonsa Overall, MD;  Location: [place];  Service: General;  Laterality: Right;  . PORT-A-CATH REMOVAL   07/28/2015  . PORT-A-CATH REMOVAL  07/28/2015   Procedure: REMOVAL PORT-A-CATH;  Surgeon: Alphonsa Overall, MD;  Location: [place];  Service: General;;  . PORTACATH PLACEMENT N/A 02/10/2015   Procedure: INSERTION PORT-A-CATH WITH ULTRA SOUND, left subclavian,;  Surgeon: Alphonsa Overall, MD;  Location: [facility];  Service: General;  Laterality: N/A;  . REMOVAL OF TISSUE EXPANDER AND PLACEMENT OF IMPLANT Right 09/20/2016   Procedure: REMOVAL OF RIGHT TISSUE EXPANDER WITH PLACEMENT OF RIGHT SILICONE BREAST IMPLANTS LIPOFILLING FROM ABDOMEN TO RIGHT CHEST;  Surgeon: Irene Limbo, MD;  Location: [place];  Service: Plastics;  Laterality: Right;  PLACEMENT OF RIGHT SILICONE BREAST IMPLANTS LIPOFILLING FROM ABDOMEN TO RIGHT CHEST  . ROBOTIC ASSITED PARTIAL NEPHRECTOMY Left 12/29/2015   Procedure: XI ROBOTIC ASSITED PARTIAL NEPHRECTOMY;  Surgeon: Cleon Gustin, MD;  Location: [facility];  Service: Urology;  Laterality: Left;  . TISSUE EXPANDER PLACEMENT Right 06/03/2016   Procedure: PLACEMENT RIGHT TISSUE EXPANDER;  Surgeon: Irene Limbo, MD;  Location: [place];  Service: Plastics;  Laterality: Right;  . TONSILLECTOMY    . TUBAL LIGATION    . wisdome teeth extraction       FAMILY HISTORY: No family history on file.   SOCIAL HISTORY:  reports that she has never smoked. She has never used smokeless tobacco. She reports that she does not drink alcohol or use drugs.   ALLERGIES: Sulfa antibiotics   MEDICATIONS:  Current Outpatient Prescriptions  Medication Sig Dispense Refill  . benazepril (LOTENSIN) 10 MG tablet Take 1 tablet (10 mg total) by mouth daily.    . Black Pepper-Turmeric 3-500 MG CAPS Take 1 capsule by mouth daily.    . Ginger, Zingiber officinalis, (GINGER ROOT PO) Take 1 tablet by mouth daily.    Marland Kitchen HYDROcodone-acetaminophen (NORCO/VICODIN) 5-325 MG tablet Take 1-2 tablets by mouth every 4 (four) hours as needed for moderate pain. 30 tablet 0  . Multiple Vitamins-Minerals  (MULTIVITAMIN) tablet Take 1 tablet by mouth daily.    Marland Kitchen oxyCODONE-acetaminophen (ROXICET) 5-325 MG tablet Take 1-2 tablets by mouth every 6 (six) hours as needed for severe pain. 30 tablet 0  . Probiotic Product (PROBIOTIC PO) Take 1 tablet by mouth daily.    Marland Kitchen gabapentin (NEURONTIN) 300 MG capsule Take 1 capsule (300 mg total) by mouth 3 (three) times daily as needed. 120 capsule 0  . methocarbamol (ROBAXIN) 500 MG tablet Take 1 tablet (500 mg total) by mouth every 8 (eight) hours as needed for muscle spasms. (Patient not taking: Reported on 03/11/2017) 30 tablet 0  . predniSONE (DELTASONE) 10 MG tablet Take 6 tabs po day 1, 5 tabs po day 2, 4 tabs po day 3, 3 tabs po day 4, 2 tabs po day 5, 1 tab po day 6, then dc 21 tablet 0   No current facility-administered medications for this encounter.      REVIEW OF SYSTEMS: On review of systems, the patient reports that she is doing well overall but understandably upset about her recurrence. She is waiting to hear back from [facility] for an appointment to see if there are any clinical trial  she will clinical trials she may qualify for. she describes constant pain primarily in her right index finger as well as her medial aspect of her forearm extending into the medial aspect of her upper arm on the right. She describes a numbness intermittently that occurs primarily notes constant numbness and shooting pain in the right index finger and occasionally in her thumb. She denies any sternal chest pain, shortness of breath, cough, fevers, chills, night sweats, unintended weight changes. She denies any bowel or bladder disturbances, and denies abdominal pain, nausea or vomiting. She denies any new musculoskeletal or joint aches or pains. A complete review of systems is obtained and is otherwise negative.     PHYSICAL EXAM:  Wt Readings from Last 3 Encounters:  03/11/17 195 lb (88.5 kg)  03/05/17 195 lb 1.6 oz (88.5 kg)  03/01/17 197 lb (89.4 kg)   Temp Readings from  Last 3 Encounters:  03/11/17 98.5 F (36.9 C) (Oral)  03/05/17 98.2 F (36.8 C) (Oral)  02/26/17 98.5 F (36.9 C) (Oral)   BP Readings from Last 3 Encounters:  03/11/17 (!) 158/98  03/05/17 (!) 171/96  02/26/17 (!) 142/89   Pulse Readings from Last 3 Encounters:  03/11/17 95  03/05/17 (!) 105  02/26/17 99   Pain Assessment Pain Score: 8  (Right arm and fingers)/10  In general this is a well appearing African-American female  in no acute distress. She is alert and oriented x4 and appropriate throughout the examination. HEENT reveals that the patient is normocephalic, atraumatic. EOMs are intact. PERRLA. Skin is intact without any evidence of gross lesions. Cardiopulmonary assessment is negative for acute distress and she exhibits normal effort. There is trace pitting edema of the right upper extremity consistent with lymphedema that tracks cephalad to the biceps region of the upper arm. Indentations from wearing a compression sleeve are noted. She has 4/5 grip strength in the right extremity compared to the left which is 5/ 5. Intact sensation is noted to light touch.   ECOG = 1  0 - Asymptomatic (Fully active, able to carry on all predisease activities without restriction)  1 - Symptomatic but completely ambulatory (Restricted in physically strenuous activity but ambulatory and able to carry out work of a light or sedentary nature. For example, light housework, office work)  2 - Symptomatic, <50% in bed during the day (Ambulatory and capable of all self care but unable to carry out any work activities. Up and about more than 50% of waking hours)  3 - Symptomatic, >50% in bed, but not bedbound (Capable of only limited self-care, confined to bed or chair 50% or more of waking hours)  4 - Bedbound (Completely disabled. Cannot carry on any self-care. Totally confined to bed or chair)  5 - Death   Eustace Pen MM, Creech RH, Tormey DC, et al. [phone]). "Toxicity and response criteria of the  [facility]". [place] Oncol. 5 (6): 649-55    LABORATORY DATA:  Lab Results  Component Value Date   WBC 6.7 05/21/2016   HGB 10.7 (L) 05/21/2016   HCT 33.2 (L) 05/21/2016   MCV 83.1 05/21/2016   PLT 293 05/21/2016   Lab Results  Component Value Date   NA 141 05/21/2016   K 3.9 05/21/2016   CL 104 04/23/2016   CO2 25 05/21/2016   Lab Results  Component Value Date   ALT 16 05/21/2016   AST 16 05/21/2016   ALKPHOS 92 05/21/2016   BILITOT 0.38 05/21/2016  RADIOGRAPHY: Nm Pet Image Restag (ps) Skull Base To Thigh  Result Date: 03/03/2017 CLINICAL DATA:  Subsequent treatment strategy for biopsy-proven relapsed triple negative right breast cancer (recently diagnosed in April 2016 status post right mastectomy) with right neck and right upper extremity pain. Additional history of left nephrectomy in March 2017 for renal cell carcinoma. EXAM: NUCLEAR MEDICINE PET SKULL BASE TO THIGH TECHNIQUE: 9.8 mCi F-18 FDG was injected intravenously. Full-ring PET imaging was performed from the skull base to thigh after the radiotracer. CT data was obtained and used for attenuation correction and anatomic localization. FASTING BLOOD GLUCOSE:  Value: 121 mg/dl COMPARISON:  12/26/2016 CT abdomen/pelvis. 02/14/2015 chest CT angiogram. FINDINGS: NECK No hypermetabolic lymph nodes in the neck. CHEST Status post right mastectomy. Low level curvilinear metabolism in the superficial right chest wall, superficial to the intact appearing right breast prosthesis, is most compatible with inflammatory postsurgical uptake. Focal hypermetabolism in the upper right pectoralis muscle (series 606/image 51) with max SUV 5.5, without correlate on the noncontrast CT images, compatible with locally recurrent intramuscular soft tissue metastasis. Poorly marginated hypermetabolic 3.5 x 2.1 cm right axillary mass (series 4/image 49) with max SUV 8.7, compatible with infiltrative local tumor  recurrence. Mildly enlarged hypermetabolic 1.0 cm right paratracheal node (series 4/image 53). Mildly enlarged hypermetabolic right internal mammary nodes, for example a 1.2 cm right internal mammary node with max SUV 7.6 (series 4/image 53). Mildly hypermetabolic right hilar node with max SUV 4.7. No additional hypermetabolic axillary, mediastinal or hilar nodes. No pleural effusion. No pneumothorax. There are a few new solid pulmonary nodules scattered in the right lung, largest 6 mm in the right upper lobe (series 8/image 14) and 6 mm in the basilar right lower lobe (series 8/image 44), below PET resolution. No acute consolidative airspace disease. ABDOMEN/PELVIS Newly hypermetabolic 1.2 cm segment 8 right liver lobe mass with max SUV 6.6 (series 4/image 78). No additional hypermetabolic liver masses. No abnormal hypermetabolic activity within the pancreas, adrenal glands, or spleen. No hypermetabolic lymph nodes in the abdomen or pelvis. Simple 1.6 cm posterior interpolar left renal cyst. Postsurgical changes from partial nephrectomy in the anterior left kidney with no noncontrast CT findings to suggest local tumor recurrence. Small hiatal hernia. SKELETON Left neck hypermetabolic osseous lesion in the left upper scapula at the base of the coracoid process with max SUV 6.2. No additional hypermetabolic osseous lesions. IMPRESSION: 1. Hypermetabolic infiltrative locally recurrent tumor in the upper right pectoralis musculature and in the right axilla. 2. Hypermetabolic right hilar, right paratracheal and right internal mammary nodal metastases . 3. Solitary hypermetabolic segment 8 right liver lobe metastasis. 4. Solitary hypermetabolic left scapular osseous metastasis. 5. Several new solid subcentimeter pulmonary nodules in the right lung, below PET resolution, suspicious for pulmonary metastases. Recommend attention on follow-up chest CT in 3 months. Electronically Signed   By: Ilona Sorrel M.D.   On:  03/03/2017 11:30   [place] Axillary Node Core Biopsy Right  Addendum Date: 02/28/2017   ADDENDUM REPORT: 02/28/2017 15:34 ADDENDUM: Pathology revealed SOFT TISSUE WITH INVOLVEMENT BY CARCINOMA of the Right axilla, medial mass #2 (mass #1 within the medial right axilla was biopsied last week.) SKELETAL MUSCLE WITH INVOLVEMENT BY CARCINOMA of the Right axilla, lateral mass #3. The appearance in both biopsies is similar to previously diagnosed invasive ductal carcinoma. This was found to be concordant by Dr. Franki Cabot. Pathology results were discussed with the patient by telephone. The patient reported doing well after the biopsies with tenderness at the  sites. Post biopsy instructions and care were reviewed and questions were answered. The patient was encouraged to call [facility] for any additional concerns. The patient has a recent diagnosis of recurrent right breast cancer and should follow her outlined treatment plan. Pathology results reported by Terie Purser, RN on 02/28/2017. Electronically Signed   By: Franki Cabot M.D.   On: 02/28/2017 15:34   Result Date: 02/28/2017 CLINICAL DATA:  Patient with a history of right breast cancer status post mastectomy. Recently presented with a palpable mass in the medial right axilla which was biopsied demonstrating cancer recurrence. Patient presents today for ultrasound-guided biopsy of an additional mass within the medial right axilla. During preprocedure ultrasound evaluation, a third mass was identified in the lateral right axilla which will also be biopsied. EXAM: ULTRASOUND GUIDED RIGHT AXILLA CORE NEEDLE BIOPSY x2 COMPARISON:  Previous exam(s). FINDINGS: I met with the patient and we discussed the procedure of ultrasound-guided biopsy, including benefits and alternatives. We discussed the high likelihood of a successful procedure. We discussed the risks of the procedure, including infection, bleeding, tissue injury, clip migration, and  inadequate sampling. Informed written consent was given. The usual time-out protocol was performed immediately prior to the procedure. Lesion 1 quadrant: Axilla Lesion 2 quadrant: Axilla Using sterile technique and 1% Lidocaine as local anesthetic, under direct ultrasound visualization, a 14 gauge spring-loaded device was used to perform biopsy of the mass in the medial right axilla using a lateral approach. At the conclusion of the procedure a coil shaped tissue marker clip was deployed into the biopsy cavity. Next, using sterile technique and 1% lidocaine as local anesthetic, under direct ultrasound visualization, a 14 gauge spring-loaded device was used to perform biopsy of the mass in the lateral right axilla using a lateral approach. At the conclusion of the procedure, a spiral shaped clip was placed at the biopsy site. IMPRESSION: Ultrasound guided biopsy of 2 masses in the right axilla. As described in the clinical data, patient was recently biopsied for a palpable mass in the medial right axilla with pathology result of cancer recurrence. Patient presented today for ultrasound-guided biopsy of a second mass within the medial right axilla (within the pectoralis muscle). During preprocedure ultrasound evaluation, a third mass was identified within the lateral right axilla and this was also biopsied today. No apparent complications. Electronically Signed: By: Franki Cabot M.D. On: 02/27/2017 09:10   [place] Axillary Node Core Biopsy Right  Addendum Date: 02/28/2017   ADDENDUM REPORT: 02/28/2017 15:34 ADDENDUM: Pathology revealed SOFT TISSUE WITH INVOLVEMENT BY CARCINOMA of the Right axilla, medial mass #2 (mass #1 within the medial right axilla was biopsied last week.) SKELETAL MUSCLE WITH INVOLVEMENT BY CARCINOMA of the Right axilla, lateral mass #3. The appearance in both biopsies is similar to previously diagnosed invasive ductal carcinoma. This was found to be concordant by Dr. Franki Cabot. Pathology  results were discussed with the patient by telephone. The patient reported doing well after the biopsies with tenderness at the sites. Post biopsy instructions and care were reviewed and questions were answered. The patient was encouraged to call [facility] for any additional concerns. The patient has a recent diagnosis of recurrent right breast cancer and should follow her outlined treatment plan. Pathology results reported by Terie Purser, RN on 02/28/2017. Electronically Signed   By: Franki Cabot M.D.   On: 02/28/2017 15:34   Result Date: 02/28/2017 CLINICAL DATA:  Patient with a history of right  breast cancer status post mastectomy. Recently presented with a palpable mass in the medial right axilla which was biopsied demonstrating cancer recurrence. Patient presents today for ultrasound-guided biopsy of an additional mass within the medial right axilla. During preprocedure ultrasound evaluation, a third mass was identified in the lateral right axilla which will also be biopsied. EXAM: ULTRASOUND GUIDED RIGHT AXILLA CORE NEEDLE BIOPSY x2 COMPARISON:  Previous exam(s). FINDINGS: I met with the patient and we discussed the procedure of ultrasound-guided biopsy, including benefits and alternatives. We discussed the high likelihood of a successful procedure. We discussed the risks of the procedure, including infection, bleeding, tissue injury, clip migration, and inadequate sampling. Informed written consent was given. The usual time-out protocol was performed immediately prior to the procedure. Lesion 1 quadrant: Axilla Lesion 2 quadrant: Axilla Using sterile technique and 1% Lidocaine as local anesthetic, under direct ultrasound visualization, a 14 gauge spring-loaded device was used to perform biopsy of the mass in the medial right axilla using a lateral approach. At the conclusion of the procedure a coil shaped tissue marker clip was deployed into the biopsy cavity. Next, using  sterile technique and 1% lidocaine as local anesthetic, under direct ultrasound visualization, a 14 gauge spring-loaded device was used to perform biopsy of the mass in the lateral right axilla using a lateral approach. At the conclusion of the procedure, a spiral shaped clip was placed at the biopsy site. IMPRESSION: Ultrasound guided biopsy of 2 masses in the right axilla. As described in the clinical data, patient was recently biopsied for a palpable mass in the medial right axilla with pathology result of cancer recurrence. Patient presented today for ultrasound-guided biopsy of a second mass within the medial right axilla (within the pectoralis muscle). During preprocedure ultrasound evaluation, a third mass was identified within the lateral right axilla and this was also biopsied today. No apparent complications. Electronically Signed: By: Franki Cabot M.D. On: 02/27/2017 09:10   [place] Axilla Right  Result Date: 02/21/2017 CLINICAL DATA:  History of right breast cancer in 2016 status post mastectomy and radiation therapy. Patient describes a new right axillary lump. EXAM: ULTRASOUND OF THE RIGHT BREAST COMPARISON:  Previous exam(s). FINDINGS: Targeted ultrasound is performed, showing an oval anti-parallel isoechoic-to-hypoechoic mass within the medial right axilla, measuring 8 x 7 x 7 mm, partially involving the underlying pectoralis musculature, corresponding to the palpable lump. Additional similar-appearing mass is seen within the underlying pectoralis musculature, slightly lateral, measuring 9 x 7 x 7 mm, corresponding as an incidental finding. The 2 masses are approximately 1.5 cm apart. Further right axillary ultrasound shows no enlarged or morphologically abnormal lymph nodes. IMPRESSION: Two similar appearing masses within the right axilla, 1 of which corresponds to patient's palpable lump, located 1.5 cm apart. The palpable mass measures 8 x 7 x 7 mm, partially involving the underlying pectoralis  musculature. Both masses are suspicious findings, with differential including neoplastic process and fat necrosis. Ultrasound-guided biopsy is recommended for the palpable mass. RECOMMENDATION: 1. Ultrasound-guided biopsy of the palpable mass within the medial right axilla. 2. This biopsy will be considered representative of both masses given their similar appearance. 3. If biopsy result is positive for malignancy, would consider MRI to evaluate extent of disease in the axilla and pectoralis musculature. Ultrasound-guided biopsy is scheduled for later today. I have discussed the findings and recommendations with the patient. Results were also provided in writing at the conclusion of the visit. If applicable, a reminder letter will be sent to the patient regarding  the next appointment. BI-RADS CATEGORY  4: Suspicious. Electronically Signed   By: Franki Cabot M.D.   On: 02/21/2017 10:23   [place] Rt Breast Bx W Loc Dev 1st Lesion Img Bx Spec US Guide  Addendum Date: 02/26/2017   ADDENDUM REPORT: 02/26/2017 08:54 ADDENDUM: Pathology revealed metastatic carcinoma involving skeletal muscle and soft tissue from the right axilla. This was found to be concordant by Dr. Franki Cabot. Pathology results were discussed with the patient by telephone. The patient reported doing well after the biopsy. Post biopsy instructions and care were reviewed and questions were answered. The patient was encouraged to call [facility] for any additional concerns. Surgical consultation has been arranged with Dr. Alphonsa Overall at [facility] on Mar 13, 2017. Consider MRI and/or chest CT to evaluate extent of disease within the pectoralis musculature and to exclude deeper involvement of the chest wall. Pathology results reported by Susa Raring RN, BSN on 02/26/2017. Electronically Signed   By: Franki Cabot M.D.   On: 02/26/2017 08:54   Result Date: 02/26/2017 CLINICAL DATA:  Patient with a  palpable mass within the medial right axilla presents today for ultrasound-guided biopsy EXAM: ULTRASOUND GUIDED RIGHT BREAST CORE NEEDLE BIOPSY COMPARISON:  Previous exam(s). PROCEDURE: I met with the patient and we discussed the procedure of ultrasound-guided biopsy, including benefits and alternatives. We discussed the high likelihood of a successful procedure. We discussed the risks of the procedure including infection, bleeding, tissue injury, clip migration, and inadequate sampling. Informed written consent was given. The usual time-out protocol was performed immediately prior to the procedure. Lesion quadrant: Medial axilla Using sterile technique and 1% Lidocaine as local anesthetic, under direct ultrasound visualization, a 14 gauge spring-loaded device was used to perform biopsy of the mass within the medial right axillausing a inferolateral approach. At the conclusion of the procedure, a ribbon shaped tissue marker clip was deployed into the biopsy cavity. Follow-up 2-view mammogram was performed and dictated separately. IMPRESSION: Ultrasound-guided biopsy of the palpable mass within the medial right axilla. No apparent complications. Electronically Signed: By: Franki Cabot M.D. On: 02/21/2017 16:35       IMPRESSION/PLAN: 1. 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 Dr. Lisbeth Renshaw discusses the findings on imaging with the patient as well as the pathology results from her biopsies. He discusses the options for retrieving in the region of the axilla and chest wall are certainly available. He discusses the risks, benefits and recovery of retreating an area, and the patient is interested in moving forward with this. We did discuss the delivery and logistics and in order to treat but also avoid untoward toxicity, he recommends a course of 3 weeks of palliative radiotherapy. The patient is in agreement with like to begin. He has simulation set up for this week.  Written consent is obtained patient is interested in moving forward. A copy is provided to the patient in the original placed in the chart. She will continue moving forward with a second opinion for possible clinical trial at [facility]. 2. Pain secondary to #1. Dr. Lisbeth Renshaw discussed the options for a steroid taper, prednisone 10 mg tablets were prescribed and called into her pharmacy for her to take 60 mg on day one, 50 mg on day 2, 40 mg on day 3, 30 mg on day 4, 20 mg on day 5, and 10 mg of the sixth day then stop. We discussed the side effects profiles  the drug, the patient is interested in proceeding with this as well as a prescription for Neurontin which we outlined. Prescription is provided to her pharmacy for 300 mg Neurontin for her to take this one tablet by mouth 3 times a day as needed when necessary for nerve pain. She does informed any questions or concerns that arise regarding this. She is counseled side effect profile of these agents.   The above documentation reflects my direct findings during this shared patient visit. Please see the separate note by Dr. Lisbeth Renshaw on this date for the remainder of the patient's plan of care.    Carola Rhine, PAC

## 2017-03-12 ENCOUNTER — Ambulatory Visit
Admission: RE | Admit: 2017-03-12 | Discharge: 2017-03-12 | Disposition: A | Discharge status: Home / Self Care | Payer: 59 | Source: Ambulatory Visit | Attending: Radiation Oncology | Admitting: Radiation Oncology

## 2017-03-12 ENCOUNTER — Telehealth: Payer: Self-pay | Admitting: Hematology and Oncology

## 2017-03-12 DIAGNOSIS — Z510 Encounter for antineoplastic radiation therapy: Secondary | ICD-10-CM | POA: Diagnosis not present

## 2017-03-12 NOTE — Telephone Encounter (Signed)
Faxed records to [facility] @ Dr Wetzel Bjornstad office.  Otila Kluver will call pt with appt.

## 2017-03-13 ENCOUNTER — Telehealth: Payer: Self-pay | Admitting: Oncology

## 2017-03-13 NOTE — Telephone Encounter (Signed)
Called and left patient a voicemail regarding her apts on Friday 03/14/17.  Told patient MRI was at 9:00 am and then come to the cancer center after finished with MRI to have research labs drawn and to see Mira. Remer Macho 03/13/17 - 2:15 pm

## 2017-03-14 ENCOUNTER — Encounter: Payer: 59 | Admitting: Medical Oncology

## 2017-03-14 ENCOUNTER — Encounter: Payer: Self-pay | Admitting: Hematology and Oncology

## 2017-03-14 ENCOUNTER — Encounter: Payer: Self-pay | Admitting: Medical Oncology

## 2017-03-14 ENCOUNTER — Ambulatory Visit (HOSPITAL_COMMUNITY)
Admission: RE | Admit: 2017-03-14 | Discharge: 2017-03-14 | Disposition: A | Discharge status: Home / Self Care | Payer: 59 | Source: Ambulatory Visit | Attending: Hematology and Oncology | Admitting: Hematology and Oncology

## 2017-03-14 ENCOUNTER — Other Ambulatory Visit: Payer: 59

## 2017-03-14 DIAGNOSIS — Z170 Estrogen receptor positive status [ER+]: Principal | ICD-10-CM

## 2017-03-14 DIAGNOSIS — C50411 Malignant neoplasm of upper-outer quadrant of right female breast: Secondary | ICD-10-CM

## 2017-03-14 DIAGNOSIS — Z006 Encounter for examination for normal comparison and control in clinical research program: Secondary | ICD-10-CM

## 2017-03-14 LAB — RESEARCH LABS

## 2017-03-14 NOTE — Progress Notes (Unsigned)
PREVENT: 24 month visit. I met with patient this morning in lobby after her study Cardiac MRI completed. Once patient had completed her 24 month study research lab draws, we proceeded to obtain v/s and weight. I reviewed with patient her current medications and confirmed that patient is not on any statins. Patient's waist measurement completed, waist 43", and measurement verified by research assistant Remer Macho. Patient assessed for peripheral edema, and no edema present. 64-monthNeurocognitive packet was completed with patient by research assistant, ARemer Macho There were no study medication bottle or medication diaries to be collected due to patient signing Withdrawal of Consent for treatment on 10/02/2015, patient had agreed to be followed for data collection for the study. AE's were not reviewed with patient due to patient not taking study drug. I thanked patient today for her time, her continued support of study and her contribution. Patient was also informed that once the labs and cardiac MRI are resulted and Dr. GLindi Adiehas seen them, I will contact patient with the results. IIn the meantime, I encouraged patient to call me with questions.  LAdele Dan RN, BSN Clinical Research 03/14/2017 12:01 PM

## 2017-03-19 DIAGNOSIS — Z510 Encounter for antineoplastic radiation therapy: Secondary | ICD-10-CM | POA: Diagnosis not present

## 2017-03-20 ENCOUNTER — Telehealth: Payer: Self-pay

## 2017-03-20 ENCOUNTER — Other Ambulatory Visit: Payer: Self-pay

## 2017-03-20 ENCOUNTER — Ambulatory Visit: Admission: RE | Admit: 2017-03-20 | Payer: 59 | Source: Ambulatory Visit | Admitting: Radiation Oncology

## 2017-03-20 ENCOUNTER — Other Ambulatory Visit: Payer: Self-pay | Admitting: Hematology and Oncology

## 2017-03-20 DIAGNOSIS — C50411 Malignant neoplasm of upper-outer quadrant of right female breast: Secondary | ICD-10-CM

## 2017-03-20 DIAGNOSIS — Z171 Estrogen receptor negative status [ER-]: Principal | ICD-10-CM

## 2017-03-20 MED ORDER — PROCHLORPERAZINE MALEATE 10 MG PO TABS: 10.0000 mg | ORAL_TABLET | Freq: Four times a day (QID) | ORAL | 1 refills | Status: DC | PRN

## 2017-03-20 MED ORDER — ONDANSETRON HCL 8 MG PO TABS: 8.0000 mg | ORAL_TABLET | Freq: Two times a day (BID) | ORAL | 1 refills | Status: DC | PRN

## 2017-03-20 MED ORDER — LORAZEPAM 0.5 MG PO TABS: 0.5000 mg | ORAL_TABLET | Freq: Four times a day (QID) | ORAL | 0 refills | Status: DC | PRN

## 2017-03-20 MED ORDER — DEXAMETHASONE 4 MG PO TABS: 4.0000 mg | ORAL_TABLET | Freq: Every day | ORAL | 1 refills | Status: DC

## 2017-03-20 MED ORDER — LIDOCAINE-PRILOCAINE 2.5-2.5 % EX CREA
TOPICAL_CREAM | CUTANEOUS | 3 refills | Status: DC
Start: 2017-03-20 — End: 2017-09-12

## 2017-03-20 NOTE — Progress Notes (Signed)
START OFF PATHWAY REGIMEN - Breast   [id number]:Gemcitabine + Carboplatin (1000/2) q21 Days:   A cycle is every 21 days:     Gemcitabine      Carboplatin   **Always confirm dose/schedule in your pharmacy ordering system**    Patient Characteristics: Metastatic Chemotherapy, HER2 Negative/Unknown/Equivocal, ER -Verita Schneiders, Second Line, Prior Paclitaxel Therapeutic Status: Distant Metastases BRCA Mutation Status: Absent ER Status: Negative (-) HER2 Status: Negative (-) Would you be surprised if this patient died  in the next year? I would be surprised if this patient died in the next year PR Status: Negative (-) Line of therapy: Second Line Intent of Therapy: Non-Curative / Palliative Intent, Discussed with Patient

## 2017-03-20 NOTE — Telephone Encounter (Signed)
Called pt lvm regarding setting appt to see Dr.Gudena to discuss proceeding with chemo treatment, starting next week. Set up appt for tomorrow at 11:15am, but pt may reschedule if she can't make that appt to next week.

## 2017-03-21 ENCOUNTER — Telehealth: Payer: Self-pay | Admitting: Hematology and Oncology

## 2017-03-21 ENCOUNTER — Ambulatory Visit (HOSPITAL_BASED_OUTPATIENT_CLINIC_OR_DEPARTMENT_OTHER): Payer: 59 | Admitting: Hematology and Oncology

## 2017-03-21 ENCOUNTER — Ambulatory Visit: Payer: 59

## 2017-03-21 ENCOUNTER — Other Ambulatory Visit: Payer: Self-pay

## 2017-03-21 ENCOUNTER — Encounter: Payer: Self-pay | Admitting: Hematology and Oncology

## 2017-03-21 DIAGNOSIS — M79601 Pain in right arm: Secondary | ICD-10-CM | POA: Diagnosis not present

## 2017-03-21 DIAGNOSIS — C773 Secondary and unspecified malignant neoplasm of axilla and upper limb lymph nodes: Secondary | ICD-10-CM | POA: Diagnosis not present

## 2017-03-21 DIAGNOSIS — C50411 Malignant neoplasm of upper-outer quadrant of right female breast: Secondary | ICD-10-CM | POA: Diagnosis not present

## 2017-03-21 DIAGNOSIS — Z171 Estrogen receptor negative status [ER-]: Secondary | ICD-10-CM | POA: Diagnosis not present

## 2017-03-21 DIAGNOSIS — M25511 Pain in right shoulder: Secondary | ICD-10-CM | POA: Diagnosis not present

## 2017-03-21 NOTE — Progress Notes (Signed)
Patient Care Team: [name], MD as PCP - General (Family Medicine) Magrinat, Virgie Dad, MD as Consulting Physician (Oncology) Thea Silversmith, MD (Inactive) as Consulting Physician (Radiation Oncology) Mauro Kaufmann, RN as Registered Nurse Rockwell Germany, RN as Registered Nurse Alphonsa Overall, MD as Consulting Physician (General Surgery) Sylvan Cheese, NP as Nurse Practitioner (Hematology and Oncology)  DIAGNOSIS:  Encounter Diagnosis  Name Primary?  . Malignant neoplasm of upper-outer quadrant of right breast in female, estrogen receptor negative ([place])     SUMMARY OF ONCOLOGIC HISTORY:   Breast cancer of upper-outer quadrant of right female breast ([place])   01/19/2015 Mammogram    Right breast: density with indistinct margin at 12:00, posterior depth; also oval mass with obscured margin at 11:00, middle depth; irregular mass with indistinct margin central to nipple, middle depth      01/23/2015 Initial Biopsy    Right breast 9:00 and 10:00 biopsy ([facility]): Grade 3 IDC ER/PR negative, HER-2 negative, Ki-67 77% and 73%; the right axillary lymph node biopsy: ER 22% positive      01/30/2015 Breast MRI    Right breast masses 9 and 10:00 position, additional irregular masses with non-mass enhancement upper outer quadrant with one mass extending into upper inner quadrant, multifocal, multicentric 13.7 x 4.6 x 4.6 cm, malignant right axillary lymph node      02/08/2015 Clinical Stage    Stage IIIA: T3, N1, M0      02/09/2015 Procedure    BreastNext panel revealed VUS BRCA2 gene called [id number] (c.[id number]); otherwise no clinically significant variant at ATM, BARD1, BRCA1, BRCA2, BRIP1, CDH1, CHEK2, MRE11A, MUTYH, NBN, NF1, PALB2, PTEN, RAD50, RAD51C, RAD51D, and TP53      02/15/2015 Imaging    No evidence of metastatic disease, 2.2 cm enhancing lesion anterior left lower kidney suspicious for solid renal neoplasm      02/20/2015 - 07/10/2015 Neo-Adjuvant Chemotherapy   dose dense doxorubicin and cyclophosphamide x 4 followed by Abraxane weekly x 12      07/10/2015 Breast MRI    Multiple irregular enhancing masses right breast are less confluent and smaller dominant mass 10:00 2.1 x 1.9 x 1.3 cm; right breast 11:00 mass measures 1.2 x 1.2 x 0.8 cm, decreased right axillary lymph node thickening      07/28/2015 Definitive Surgery    Rt mastectomy: Multifocal IDC 2 cm, 1.5 cm. 0.6 cm; 3/13 LN positive, ER/PR HER-2      07/28/2015 Pathologic Stage    Stage IIA: T1c N1 M0        09/03/2015 - 10/13/2015 Radiation Therapy    Adjuvant radiation therapy: Right chest wall/50.4 Pearline Cables @ 1.8 Gray per fraction x 28 fractions. Right supraclavicular fossa 45 Gy _0 .8 Gy per fraction x 25 fractions      11/15/2015 - 05/10/2016 Chemotherapy    Adjuvant capecitabine - completed 8 cycles      11/16/2015 Survivorship    Survivorship visit completed and copy of care plan given to patient      02/23/2016 Procedure    Left mandible biopsy: Scant material, no malignancy      05/21/2016 -  Anti-estrogen oral therapy    Patient did not take tamoxifen because her ER was weakly positive at 22% and she was worried about toxicities      02/21/2017 Imaging    Rt Axilla 8 X 7 X 7 mm mass inv pectoralis musculature, additional mass 9 mm 1.5 cm apart      02/21/2017 Relapse/Recurrence  Right axillary mass biopsy: 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       Renal cell carcinoma of left kidney ([facility])   01/29/2016 Surgery    Left renal mass partial resection: 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 Anders. The  recommendation was to initiate systemic chemotherapy with carboplatin and gemcitabine because she was not eligible for the existing clinical trials that were there. She is here today accompanied by her husband to discuss starting the clinical trial. She continues to have severe pain in the right arm and shoulder. She rates it at 8 out of 10. She takes pain medications which appeared to help.   REVIEW OF SYSTEMS:   Constitutional: Denies fevers, chills or abnormal weight loss Eyes: Denies blurriness of vision Ears, nose, mouth, throat, and face: Denies mucositis or sore throat Respiratory: Denies cough, dyspnea or wheezes Cardiovascular: Denies palpitation, chest discomfort Gastrointestinal:  Denies nausea, heartburn or change in bowel habits Skin: Denies abnormal skin rashes Lymphatics: Denies new lymphadenopathy or easy bruising Neurological:Denies numbness, tingling or new weaknesses Behavioral/Psych: Mood is stable, no new changes  Extremities: No lower extremity edema  All other systems were reviewed with the patient and are negative.  I have reviewed the past medical history, past surgical history, social history and family history with the patient and they are unchanged from previous note.  ALLERGIES:  is allergic to sulfa antibiotics.  MEDICATIONS:  Current Outpatient Prescriptions  Medication Sig Dispense Refill  . benazepril (LOTENSIN) 10 MG tablet Take 1 tablet (10 mg total) by mouth daily.    . Black Pepper-Turmeric 3-500 MG CAPS Take 1 capsule by mouth daily.    . dexamethasone (DECADRON) 4 MG tablet Take 1 tablet (4 mg total) by mouth daily. Start the day after chemotherapy for 2 days. Take with food. 30 tablet 1  . gabapentin (NEURONTIN) 300 MG capsule Take 1 capsule (300 mg total) by mouth 3 (three) times daily as needed. 120 capsule 0  . Ginger, Zingiber officinalis, (GINGER ROOT PO) Take 1 tablet by mouth daily.    . HYDROcodone-acetaminophen (NORCO/VICODIN) 5-325 MG  tablet Take 1-2 tablets by mouth every 4 (four) hours as needed for moderate pain. 30 tablet 0  . lidocaine-prilocaine (EMLA) cream Apply to affected area once 30 g 3  . LORazepam (ATIVAN) 0.5 MG tablet Take 1 tablet (0.5 mg total) by mouth every 6 (six) hours as needed (Nausea or vomiting). 30 tablet 0  . methocarbamol (ROBAXIN) 500 MG tablet Take 1 tablet (500 mg total) by mouth every 8 (eight) hours as needed for muscle spasms. (Patient not taking: Reported on 03/11/2017) 30 tablet 0  . Multiple Vitamins-Minerals (MULTIVITAMIN) tablet Take 1 tablet by mouth daily.    . ondansetron (ZOFRAN) 8 MG tablet Take 1 tablet (8 mg total) by mouth 2 (two) times daily as needed for refractory nausea / vomiting. Start on day 3 after chemotherapy. 30 tablet 1  . oxyCODONE-acetaminophen (ROXICET) 5-325 MG tablet Take 1-2 tablets by mouth every 6 (six) hours as needed for severe pain. 30 tablet 0  . predniSONE (DELTASONE) 10 MG tablet Take 6 tabs po day 1,   5 tabs po day 2, 4 tabs po day 3, 3 tabs po day 4, 2 tabs po day 5, 1 tab po day 6, then dc 21 tablet 0  . Probiotic Product (PROBIOTIC PO) Take 1 tablet by mouth daily.    . prochlorperazine (COMPAZINE) 10 MG tablet Take 1 tablet (10 mg total) by mouth every 6 (six) hours as needed (Nausea or vomiting). 30 tablet 1   No current facility-administered medications for this visit.     PHYSICAL EXAMINATION: ECOG PERFORMANCE STATUS: 1 - Symptomatic but completely ambulatory  Vitals:   03/21/17 1119  BP: (!) 163/84  Pulse: 81  Resp: 18  Temp: 98.5 F (36.9 C)   Filed Weights   03/21/17 1119  Weight: 190 lb (86.2 kg)    GENERAL:alert, no distress and comfortable SKIN: skin color, texture, turgor are normal, no rashes or significant lesions EYES: normal, Conjunctiva are pink and non-injected, sclera clear OROPHARYNX:no exudate, no erythema and lips, buccal mucosa, and tongue normal  NECK: supple, thyroid normal size, non-tender, without  nodularity LYMPH:  no palpable lymphadenopathy in the cervical, axillary or inguinal LUNGS: clear to auscultation and percussion with normal breathing effort HEART: regular rate & rhythm and no murmurs and no lower extremity edema ABDOMEN:abdomen soft, non-tender and normal bowel sounds MUSCULOSKELETAL:no cyanosis of digits and no clubbing  NEURO: alert & oriented x 3 with fluent speech, no focal motor/sensory deficits EXTREMITIES: No lower extremity edema   LABORATORY DATA:  I have reviewed the data as listed   Chemistry      Component Value Date/Time   NA 141 05/21/2016 0945   K 3.9 05/21/2016 0945   CL 104 04/23/2016 1551   CO2 25 05/21/2016 0945   BUN 6.5 (L) 05/21/2016 0945   CREATININE 0.70 03/01/2017 1345   CREATININE 0.9 05/21/2016 0945      Component Value Date/Time   CALCIUM 9.1 05/21/2016 0945   ALKPHOS 92 05/21/2016 0945   AST 16 05/21/2016 0945   ALT 16 05/21/2016 0945   BILITOT 0.38 05/21/2016 0945       Lab Results  Component Value Date   WBC 6.7 05/21/2016   HGB 10.7 (L) 05/21/2016   HCT 33.2 (L) 05/21/2016   MCV 83.1 05/21/2016   PLT 293 05/21/2016   NEUTROABS 5.0 05/21/2016    ASSESSMENT & PLAN:  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 Rt mastectomy 07/28/15: Multifocal IDC 2 cm, 1.5 cm. 0.6 cm; 3/13 LN positive, T1c N1 M0 stage IIA, ER/PR HER-2 negative. 2. Adjuvant XRT 09/03/15 to 10/13/15 3. Adjuvant Xeloda X 6 months (8 cycles) started 11/15/2015 completed July 2017 ----------------------------------------------------------------------- PET-CT scan 03/03/17:   Hypermet rec tumor in right pectoralis musculature, Rt Hilar, Rt Para tracheal and Rt Int Mamm LN mets, Solit Left scapular mets, Sol 8 mm lesion in Rt liver mets, Several sub cm lung nodules.  Rt Axillary LN Biopsy: Triple negative right breast cancer  The patient went to [facility] for a consultation with Dr. Anders who reviewed her case and recommended a bronchoscopy and biopsy which was done yesterday. Results are not available   yet.  Recommendation: 1. Palliative systemic chemo with Carboplatin-Gemcitabine Days 1 and 8 q 3 weeks Goals of treatment: Shrink the cancers and prolong life. I discussed with her that we cannot cure stage 4 cancer 2. plan to give 3 cycles of chemotherapy depending on the response, radiation afterwards.  Right arm and shoulder pain: We will hold off on palliative radiation at this point, treat her with symptomatic management  Return to clinic next week to start chemotherapy   I spent 25 minutes talking to the patient of which more than half was spent in counseling and coordination of care.  No orders of the defined types were placed in this encounter.  The patient has a good understanding of the overall plan. she agrees with it. she will call with any problems that may develop before the next visit here.   Rulon Eisenmenger, MD 03/21/17

## 2017-03-21 NOTE — Assessment & Plan Note (Signed)
Right breast invasive ductal carcinoma multifocal, multicentric disease with at least 8 tumors, 2.3 cm, 1.6 cm of the biggest tumors in addition 6 more tumors 1 cm or less spanning 13.7 cm, right axillary lymph node biopsy positive (ER 22%); primary tumor is ER 0%, PR 0%, HER-2 negative, Ki-67 77% and 73% respectively, grade 3 T3 N1 M0 equals stage IIIa, Grade 3 1. Rt mastectomy 07/28/15: Multifocal IDC 2 cm, 1.5 cm. 0.6 cm; 3/13 LN positive, T1c N1 M0 stage IIA, ER/PR HER-2 negative. 2. Adjuvant XRT 09/03/15 to 10/13/15 3. Adjuvant Xeloda X 6 months (8 cycles) started 11/15/2015 completed July 2017 ----------------------------------------------------------------------- PET-CT scan 03/03/17:  Hypermet rec tumor in right pectoralis musculature, Rt Hilar, Rt Para tracheal and Rt Int Mamm LN mets, Solit Left scapular mets, Sol 8 mm lesion in Rt liver mets, Several sub cm lung nodules.  Rt Axillary LN Biopsy: Triple negative right breast cancer  The patient went to [facility] for a consultation with Dr. Wetzel Bjornstad who reviewed her case and recommended a bronchoscopy and biopsy which was done yesterday. Results are not available yet.  Recommendation: 1. Palliative systemic chemo with Carboplatin-Gemcitabine Days 1 and 8 q 3 weeks Goals of treatment: Shrink the cancers and prolong life. I discussed with her that we cannot cure stage 4 cancer 2. plan to give 3 cycles of chemotherapy depending on the response, radiation afterwards.  Right arm and shoulder pain: We will hold off on palliative radiation at this point, treat her with symptomatic management  Return to clinic next week to start chemotherapy

## 2017-03-21 NOTE — Telephone Encounter (Signed)
Patient called concerned and upset in regards to her receiving a call to start treatment 6/19. Per patient according to her visit with Dr. [name] today this was an urgent matter, treatment was already approved and she was to start next week. Patient was upset and expressed concern regarding receiving per patient a non shalant call about her starting 6/19 as opposed to next week when she was told that this was urgent.   Added patient for lab/fu/chemo 6/12. Due to treatment being weekly other appointments will remain the same. Patient made aware and given new appointment to start 6/12. Patient care plan starts 6/12 and according to referral details treatment has been authorized.

## 2017-03-24 ENCOUNTER — Ambulatory Visit: Payer: 59

## 2017-03-25 ENCOUNTER — Ambulatory Visit (HOSPITAL_BASED_OUTPATIENT_CLINIC_OR_DEPARTMENT_OTHER): Payer: 59 | Admitting: Adult Health

## 2017-03-25 ENCOUNTER — Ambulatory Visit (HOSPITAL_BASED_OUTPATIENT_CLINIC_OR_DEPARTMENT_OTHER): Payer: 59

## 2017-03-25 ENCOUNTER — Other Ambulatory Visit (HOSPITAL_BASED_OUTPATIENT_CLINIC_OR_DEPARTMENT_OTHER): Payer: 59

## 2017-03-25 ENCOUNTER — Ambulatory Visit: Payer: 59

## 2017-03-25 VITALS — BP 146/90 | HR 103 | Temp 98.3°F | Resp 18 | Ht 65.0 in | Wt 189.4 lb

## 2017-03-25 DIAGNOSIS — C50411 Malignant neoplasm of upper-outer quadrant of right female breast: Secondary | ICD-10-CM

## 2017-03-25 DIAGNOSIS — Z171 Estrogen receptor negative status [ER-]: Secondary | ICD-10-CM | POA: Diagnosis not present

## 2017-03-25 DIAGNOSIS — C773 Secondary and unspecified malignant neoplasm of axilla and upper limb lymph nodes: Secondary | ICD-10-CM

## 2017-03-25 DIAGNOSIS — C642 Malignant neoplasm of left kidney, except renal pelvis: Secondary | ICD-10-CM | POA: Diagnosis not present

## 2017-03-25 DIAGNOSIS — Z5111 Encounter for antineoplastic chemotherapy: Secondary | ICD-10-CM

## 2017-03-25 DIAGNOSIS — Z170 Estrogen receptor positive status [ER+]: Principal | ICD-10-CM

## 2017-03-25 DIAGNOSIS — M79601 Pain in right arm: Secondary | ICD-10-CM

## 2017-03-25 LAB — COMPREHENSIVE METABOLIC PANEL
ALK PHOS: 82 U/L (ref 40–150)
ALT: 22 U/L (ref 0–55)
AST: 21 U/L (ref 5–34)
Albumin: 3.6 g/dL (ref 3.5–5.0)
Anion Gap: 8 mEq/L (ref 3–11)
BILIRUBIN TOTAL: 0.34 mg/dL (ref 0.20–1.20)
BUN: 5.5 mg/dL — AB (ref 7.0–26.0)
CALCIUM: 9.6 mg/dL (ref 8.4–10.4)
CO2: 28 mEq/L (ref 22–29)
Chloride: 103 mEq/L (ref 98–109)
Creatinine: 1 mg/dL (ref 0.6–1.1)
EGFR: 78 mL/min/{1.73_m2} — AB (ref >90)
Glucose: 103 mg/dl (ref 70–140)
POTASSIUM: 3.2 meq/L — AB (ref 3.5–5.1)
Sodium: 140 mEq/L (ref 136–145)
Total Protein: 7.4 g/dL (ref 6.4–8.3)

## 2017-03-25 LAB — CBC WITH DIFFERENTIAL/PLATELET
BASO%: 0.5 % (ref 0.0–2.0)
Basophils Absolute: 0.1 10*3/uL (ref 0.0–0.1)
EOS ABS: 0.1 10*3/uL (ref 0.0–0.5)
EOS%: 0.6 % (ref 0.0–7.0)
HEMATOCRIT: 35.2 % (ref 34.8–46.6)
HEMOGLOBIN: 11.7 g/dL (ref 11.6–15.9)
LYMPH#: 1.4 10*3/uL (ref 0.9–3.3)
LYMPH%: 13.5 % — ABNORMAL LOW (ref 14.0–49.7)
MCH: 25.7 pg (ref 25.1–34.0)
MCHC: 33.4 g/dL (ref 31.5–36.0)
MCV: 77.2 fL — AB (ref 79.5–101.0)
MONO#: 0.6 10*3/uL (ref 0.1–0.9)
MONO%: 5.7 % (ref 0.0–14.0)
NEUT#: 8 10*3/uL — ABNORMAL HIGH (ref 1.5–6.5)
NEUT%: 79.7 % — ABNORMAL HIGH (ref 38.4–76.8)
Platelets: 309 10*3/uL (ref 145–400)
RBC: 4.56 10*6/uL (ref 3.70–5.45)
RDW: 16.9 % — AB (ref 11.2–14.5)
WBC: 10.1 10*3/uL (ref 3.9–10.3)

## 2017-03-25 MED ORDER — PALONOSETRON HCL INJECTION 0.25 MG/5ML
0.2500 mg | Freq: Once | INTRAVENOUS | Status: AC
  Administered 2017-03-25: 0.25 mg via INTRAVENOUS

## 2017-03-25 MED ORDER — SODIUM CHLORIDE 0.9 % IV SOLN
2000.0000 mg | Freq: Once | INTRAVENOUS | Status: AC
  Administered 2017-03-25: 2000 mg via INTRAVENOUS
  Filled 2017-03-25: qty 52.6

## 2017-03-25 MED ORDER — DEXAMETHASONE SODIUM PHOSPHATE 10 MG/ML IJ SOLN
10.0000 mg | Freq: Once | INTRAMUSCULAR | Status: AC
  Administered 2017-03-25: 10 mg via INTRAVENOUS

## 2017-03-25 MED ORDER — HYDROCODONE-ACETAMINOPHEN 5-325 MG PO TABS: 1.0000 | ORAL_TABLET | ORAL | 0 refills | Status: DC | PRN

## 2017-03-25 MED ORDER — DEXAMETHASONE SODIUM PHOSPHATE 10 MG/ML IJ SOLN
INTRAMUSCULAR | Status: AC
  Filled 2017-03-25: qty 1

## 2017-03-25 MED ORDER — PREGABALIN 25 MG PO CAPS: 25.0000 mg | ORAL_CAPSULE | Freq: Two times a day (BID) | ORAL | 0 refills | Status: DC

## 2017-03-25 MED ORDER — CARBOPLATIN CHEMO INJECTION 450 MG/45ML
242.6000 mg | Freq: Once | INTRAVENOUS | Status: AC
  Administered 2017-03-25: 240 mg via INTRAVENOUS
  Filled 2017-03-25: qty 24

## 2017-03-25 MED ORDER — SODIUM CHLORIDE 0.9 % IV SOLN
Freq: Once | INTRAVENOUS | Status: AC
  Administered 2017-03-25: 16:00:00 via INTRAVENOUS

## 2017-03-25 MED ORDER — PALONOSETRON HCL INJECTION 0.25 MG/5ML
INTRAVENOUS | Status: AC
  Filled 2017-03-25: qty 5

## 2017-03-25 NOTE — Patient Instructions (Signed)
Carboplatin injection  What is this medicine?  CARBOPLATIN (KAR boe pla tin) is a chemotherapy drug. It targets fast dividing cells, like cancer cells, and causes these cells to die. This medicine is used to treat ovarian cancer and many other cancers.  This medicine may be used for other purposes; ask your health care provider or pharmacist if you have questions.  COMMON BRAND NAME(S): Paraplatin  What should I tell my health care provider before I take this medicine?  They need to know if you have any of these conditions:  -blood disorders  -hearing problems  -kidney disease  -recent or ongoing radiation therapy  -an unusual or allergic reaction to carboplatin, cisplatin, other chemotherapy, other medicines, foods, dyes, or preservatives  -pregnant or trying to get pregnant  -breast-feeding  How should I use this medicine?  This drug is usually given as an infusion into a vein. It is administered in a hospital or clinic by a specially trained health care professional.  Talk to your pediatrician regarding the use of this medicine in children. Special care may be needed.  Overdosage: If you think you have taken too much of this medicine contact a poison control center or emergency room at once.  NOTE: This medicine is only for you. Do not share this medicine with others.  What if I miss a dose?  It is important not to miss a dose. Call your doctor or health care professional if you are unable to keep an appointment.  What may interact with this medicine?  -medicines for seizures  -medicines to increase blood counts like filgrastim, pegfilgrastim, sargramostim  -some antibiotics like amikacin, gentamicin, neomycin, streptomycin, tobramycin  -vaccines  Talk to your doctor or health care professional before taking any of these medicines:  -acetaminophen  -aspirin  -ibuprofen  -ketoprofen  -naproxen  This list may not describe all possible interactions. Give your health  care provider a list of all the medicines, herbs, non-prescription drugs, or dietary supplements you use. Also tell them if you smoke, drink alcohol, or use illegal drugs. Some items may interact with your medicine.  What should I watch for while using this medicine?  Your condition will be monitored carefully while you are receiving this medicine. You will need important blood work done while you are taking this medicine.  This drug may make you feel generally unwell. This is not uncommon, as chemotherapy can affect healthy cells as well as cancer cells. Report any side effects. Continue your course of treatment even though you feel ill unless your doctor tells you to stop.  In some cases, you may be given additional medicines to help with side effects. Follow all directions for their use.  Call your doctor or health care professional for advice if you get a fever, chills or sore throat, or other symptoms of a cold or flu. Do not treat yourself. This drug decreases your body's ability to fight infections. Try to avoid being around people who are sick.  This medicine may increase your risk to bruise or bleed. Call your doctor or health care professional if you notice any unusual bleeding.  Be careful brushing and flossing your teeth or using a toothpick because you may get an infection or bleed more easily. If you have any dental work done, tell your dentist you are receiving this medicine.  Avoid taking products that contain aspirin, acetaminophen, ibuprofen, naproxen, or ketoprofen unless instructed by your doctor. These medicines may hide a fever.  Do not become   pregnant while taking this medicine. Women should inform their doctor if they wish to become pregnant or think they might be pregnant. There is a potential for serious side effects to an unborn child. Talk to your health care professional or pharmacist for more information. Do not breast-feed an infant while taking this medicine.   What side effects may I notice from receiving this medicine?  Side effects that you should report to your doctor or health care professional as soon as possible:  -allergic reactions like skin rash, itching or hives, swelling of the face, lips, or tongue  -signs of infection - fever or chills, cough, sore throat, pain or difficulty passing urine  -signs of decreased platelets or bleeding - bruising, pinpoint red spots on the skin, black, tarry stools, nosebleeds  -signs of decreased red blood cells - unusually weak or tired, fainting spells, lightheadedness  -breathing problems  -changes in hearing  -changes in vision  -chest pain  -high blood pressure  -low blood counts - This drug may decrease the number of white blood cells, red blood cells and platelets. You may be at increased risk for infections and bleeding.  -nausea and vomiting  -pain, swelling, redness or irritation at the injection site  -pain, tingling, numbness in the hands or feet  -problems with balance, talking, walking  -trouble passing urine or change in the amount of urine  Side effects that usually do not require medical attention (report to your doctor or health care professional if they continue or are bothersome):  -hair loss  -loss of appetite  -metallic taste in the mouth or changes in taste  This list may not describe all possible side effects. Call your doctor for medical advice about side effects. You may report side effects to FDA at 1-800-FDA-1088.  Where should I keep my medicine?  This drug is given in a hospital or clinic and will not be stored at home.  NOTE: This sheet is a summary. It may not cover all possible information. If you have questions about this medicine, talk to your doctor, pharmacist, or health care provider.  © 2018 [employer] Standard (2008-01-05 14:38:05)      Gemcitabine injection  What is this medicine?  GEMCITABINE (jem SIT a been) is a chemotherapy drug. This medicine is used  to treat many types of cancer like breast cancer, lung cancer, pancreatic cancer, and ovarian cancer.  This medicine may be used for other purposes; ask your health care provider or pharmacist if you have questions.  COMMON BRAND NAME(S): Gemzar  What should I tell my health care provider before I take this medicine?  They need to know if you have any of these conditions:  -blood disorders  -infection  -kidney disease  -liver disease  -recent or ongoing radiation therapy  -an unusual or allergic reaction to gemcitabine, other chemotherapy, other medicines, foods, dyes, or preservatives  -pregnant or trying to get pregnant  -breast-feeding  How should I use this medicine?  This drug is given as an infusion into a vein. It is administered in a hospital or clinic by a specially trained health care professional.  Talk to your pediatrician regarding the use of this medicine in children. Special care may be needed.  Overdosage: If you think you have taken too much of this medicine contact a poison control center or emergency room at once.  NOTE: This medicine is only for you. Do not share this medicine with others.  What if I miss a dose?    It is important not to miss your dose. Call your doctor or health care professional if you are unable to keep an appointment.  What may interact with this medicine?  -medicines to increase blood counts like filgrastim, pegfilgrastim, sargramostim  -some other chemotherapy drugs like cisplatin  -vaccines  Talk to your doctor or health care professional before taking any of these medicines:  -acetaminophen  -aspirin  -ibuprofen  -ketoprofen  -naproxen  This list may not describe all possible interactions. Give your health care provider a list of all the medicines, herbs, non-prescription drugs, or dietary supplements you use. Also tell them if you smoke, drink alcohol, or use illegal drugs. Some items may interact with your medicine.  What should I watch for while using this medicine?   Visit your doctor for checks on your progress. This drug may make you feel generally unwell. This is not uncommon, as chemotherapy can affect healthy cells as well as cancer cells. Report any side effects. Continue your course of treatment even though you feel ill unless your doctor tells you to stop.  In some cases, you may be given additional medicines to help with side effects. Follow all directions for their use.  Call your doctor or health care professional for advice if you get a fever, chills or sore throat, or other symptoms of a cold or flu. Do not treat yourself. This drug decreases your body's ability to fight infections. Try to avoid being around people who are sick.  This medicine may increase your risk to bruise or bleed. Call your doctor or health care professional if you notice any unusual bleeding.  Be careful brushing and flossing your teeth or using a toothpick because you may get an infection or bleed more easily. If you have any dental work done, tell your dentist you are receiving this medicine.  Avoid taking products that contain aspirin, acetaminophen, ibuprofen, naproxen, or ketoprofen unless instructed by your doctor. These medicines may hide a fever.  Women should inform their doctor if they wish to become pregnant or think they might be pregnant. There is a potential for serious side effects to an unborn child. Talk to your health care professional or pharmacist for more information. Do not breast-feed an infant while taking this medicine.  What side effects may I notice from receiving this medicine?  Side effects that you should report to your doctor or health care professional as soon as possible:  -allergic reactions like skin rash, itching or hives, swelling of the face, lips, or tongue  -low blood counts - this medicine may decrease the number of white blood cells, red blood cells and platelets. You may be at increased risk for infections and bleeding.   -signs of infection - fever or chills, cough, sore throat, pain or difficulty passing urine  -signs of decreased platelets or bleeding - bruising, pinpoint red spots on the skin, black, tarry stools, blood in the urine  -signs of decreased red blood cells - unusually weak or tired, fainting spells, lightheadedness  -breathing problems  -chest pain  -mouth sores  -nausea and vomiting  -pain, swelling, redness at site where injected  -pain, tingling, numbness in the hands or feet  -stomach pain  -swelling of ankles, feet, hands  -unusual bleeding  Side effects that usually do not require medical attention (report to your doctor or health care professional if they continue or are bothersome):  -constipation  -diarrhea  -hair loss  -loss of appetite  -stomach upset  This   list may not describe all possible side effects. Call your doctor for medical advice about side effects. You may report side effects to FDA at 1-800-FDA-1088.  Where should I keep my medicine?  This drug is given in a hospital or clinic and will not be stored at home.  NOTE: This sheet is a summary. It may not cover all possible information. If you have questions about this medicine, talk to your doctor, pharmacist, or health care provider.  © 2018 [employer]/Gold Standard (2008-02-09 18:45:54)

## 2017-03-25 NOTE — Patient Instructions (Signed)

## 2017-03-25 NOTE — Progress Notes (Signed)
[facility]:    [name], MD [address] [place] [address]   DIAGNOSIS: Cancer Staging Breast cancer of upper-outer quadrant of right female breast [facility]) Staging form: Breast, AJCC 7th Edition - Clinical stage from 02/08/2015: Stage IIIA (T3, N1, M0) - Unsigned - Pathologic stage from 07/28/2015: Stage IIA (yT1c, N1a, cM0) - Unsigned   SUMMARY OF ONCOLOGIC HISTORY:   Breast cancer of upper-outer quadrant of right female breast ([place])   01/19/2015 Mammogram    Right breast: density with indistinct margin at 12:00, posterior depth; also oval mass with obscured margin at 11:00, middle depth; irregular mass with indistinct margin central to nipple, middle depth      01/23/2015 Initial Biopsy    Right breast 9:00 and 10:00 biopsy ([facility]): Grade 3 IDC ER/PR negative, HER-2 negative, Ki-67 77% and 73%; the right axillary lymph node biopsy: ER 22% positive      01/30/2015 Breast MRI    Right breast masses 9 and 10:00 position, additional irregular masses with non-mass enhancement upper outer quadrant with one mass extending into upper inner quadrant, multifocal, multicentric 13.7 x 4.6 x 4.6 cm, malignant right axillary lymph node      02/08/2015 Clinical Stage    Stage IIIA: T3, N1, M0      02/09/2015 Procedure    BreastNext panel revealed VUS BRCA2 gene called p.[id number] (c.[id number]>G); otherwise no clinically significant variant at ATM, BARD1, BRCA1, BRCA2, BRIP1, CDH1, CHEK2, MRE11A, MUTYH, NBN, NF1, PALB2, PTEN, RAD50, RAD51C, RAD51D, and TP53      02/15/2015 Imaging    No evidence of metastatic disease, 2.2 cm enhancing lesion anterior left lower kidney suspicious for solid renal neoplasm      02/20/2015 - 07/10/2015 Neo-Adjuvant Chemotherapy    dose dense doxorubicin and cyclophosphamide x 4 followed by Abraxane weekly x 12      07/10/2015 Breast MRI    Multiple irregular enhancing masses right breast are less confluent and smaller  dominant mass 10:00 2.1 x 1.9 x 1.3 cm; right breast 11:00 mass measures 1.2 x 1.2 x 0.8 cm, decreased right axillary lymph node thickening      07/28/2015 Definitive Surgery    Rt mastectomy: Multifocal IDC 2 cm, 1.5 cm. 0.6 cm; 3/13 LN positive, ER/PR HER-2      07/28/2015 Pathologic Stage    Stage IIA: T1c N1 M0        09/03/2015 - 10/13/2015 Radiation Therapy    Adjuvant radiation therapy: Right chest wall/50.4 [place] Cables @ 1.8 Gray per fraction x 28 fractions. Right supraclavicular fossa 45 Gy _0 .8 Gy per fraction x 25 fractions      11/15/2015 - 05/10/2016 Chemotherapy    Adjuvant capecitabine - completed 8 cycles      11/16/2015 Survivorship    Survivorship visit completed and copy of care plan given to patient      02/23/2016 Procedure    Left mandible biopsy: Scant material, no malignancy      05/21/2016 -  Anti-estrogen oral therapy    Patient did not take tamoxifen because her ER was weakly positive at 22% and she was worried about toxicities      02/21/2017 Imaging    Rt Axilla 8 X 7 X 7 mm mass inv pectoralis musculature, additional mass 9 mm 1.5 cm apart      02/21/2017 Relapse/Recurrence    Right axillary mass biopsy: 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       Renal cell  carcinoma of left kidney ([place])   01/29/2016 Surgery    Left renal mass partial resection: 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 female returns for evaluation prior to receiving chemotherapy today.  She is accompanied by her husband today.  She is day 1 cycle 1 of Gemcitabine/Carboplatin.  She is feeling well.  She is having pain.  The pain is located in right elbow and up her arm.  She had been taking percocet but it makes her itch.  She would like to go back to Vicodin.  She says the pain is occurring  more frequently throughout the day and that she only likes to take the pain medication at night since it does make her sleepy.  She tried Gabapentin for the pain, but didn't get any relief.  She said she didn't like the fact that it made her groggy.     Patient Active Problem List   Diagnosis Date Noted  . Acquired absence of breast and nipple 06/03/2016  . Renal cell carcinoma of left kidney ([place]) 02/06/2016  . Left ovarian cyst 01/15/2016  . Renal mass 12/29/2015  . Chemotherapy-induced peripheral neuropathy ([place]) 08/04/2015  . Genetic testing 03/07/2015  . Left renal mass 02/17/2015  . Breast cancer of upper-outer quadrant of right female breast ([place]) 01/27/2015    is allergic to sulfa antibiotics.  MEDICAL HISTORY: Past Medical History:  Diagnosis Date  . Anemia    during pregnancy  . Breast cancer of upper-outer quadrant of right female breast ([place]) 01/27/2015  . Depression    hx denies any problems now  . History of kidney cancer 12/2015   left removed "spot"  . Lymphedema    rt arm  . Neuropathy   . Sickle cell trait ([place])     SURGICAL HISTORY: Past Surgical History:  Procedure Laterality Date  . LATISSIMUS FLAP TO BREAST Right 06/03/2016   Procedure: RIGHT LATISSIMUS FLAP TO BREAST;  Surgeon: Irene Limbo, MD;  Location: [facility];  Service: Plastics;  Laterality: Right;  . LIPOSUCTION WITH LIPOFILLING Right 09/20/2016   Procedure: LIPOSUCTION WITH LIPOFILLING;  Surgeon: Irene Limbo, MD;  Location: [facility];  Service: Plastics;  Laterality: Right;  LIPOSUCTION WITH LIPOFILLING  . MASTECTOMY    . MASTECTOMY MODIFIED RADICAL Right 07/28/2015  . MASTECTOMY MODIFIED RADICAL Right 07/28/2015   Procedure: RIGHT MODIFIED RADICAL MASTECTOMY;  Surgeon: Alphonsa Overall, MD;  Location: [facility];  Service: General;  Laterality: Right;  . PORT-A-CATH REMOVAL  07/28/2015  . PORT-A-CATH REMOVAL  07/28/2015   Procedure: REMOVAL PORT-A-CATH;  Surgeon: Alphonsa Overall,  MD;  Location: [facility];  Service: General;;  . PORTACATH PLACEMENT N/A 02/10/2015   Procedure: INSERTION PORT-A-CATH WITH ULTRA SOUND, left subclavian,;  Surgeon: Alphonsa Overall, MD;  Location: [facility];  Service: General;  Laterality: N/A;  . REMOVAL OF TISSUE EXPANDER AND PLACEMENT OF IMPLANT Right 09/20/2016   Procedure: REMOVAL OF RIGHT TISSUE EXPANDER WITH PLACEMENT OF RIGHT SILICONE BREAST IMPLANTS LIPOFILLING FROM ABDOMEN TO RIGHT CHEST;  Surgeon: Irene Limbo, MD;  Location: [facility];  Service: Plastics;  Laterality: Right;  PLACEMENT OF RIGHT SILICONE BREAST IMPLANTS LIPOFILLING FROM ABDOMEN TO RIGHT CHEST  . ROBOTIC ASSITED PARTIAL NEPHRECTOMY Left 12/29/2015   Procedure: XI ROBOTIC ASSITED PARTIAL NEPHRECTOMY;  Surgeon:  Cleon Gustin, MD;  Location: [facility];  Service: Urology;  Laterality: Left;  . TISSUE EXPANDER PLACEMENT Right 06/03/2016   Procedure: PLACEMENT RIGHT TISSUE EXPANDER;  Surgeon: Irene Limbo, MD;  Location: [facility];  Service: Plastics;  Laterality: Right;  . TONSILLECTOMY    . TUBAL LIGATION    . wisdome teeth extraction      SOCIAL HISTORY: Social History   Social History  . Marital status: Married    Spouse name: N/A  . Number of children: N/A  . Years of education: N/A   Occupational History  . Not on file.   Social History Main Topics  . Smoking status: Never Smoker  . Smokeless tobacco: Never Used  . Alcohol use No  . Drug use: No  . Sexual activity: Not on file   Other Topics Concern  . Not on file   Social History Narrative  . No narrative on file    FAMILY HISTORY: No family history on file.  Review of Systems  Constitutional: Negative for appetite change, chills, diaphoresis, fatigue and fever.  HENT:   Negative for hearing loss and lump/mass.   Eyes: Negative for eye problems and icterus.  Respiratory: Negative for chest tightness, cough and shortness of breath.   Cardiovascular: Negative for chest pain, leg  swelling and palpitations.  Gastrointestinal: Negative for abdominal distention and abdominal pain.  Musculoskeletal: Negative for arthralgias.  Skin: Negative for itching and rash.  Neurological: Negative for dizziness, extremity weakness and headaches.  Hematological: Negative for adenopathy.  Psychiatric/Behavioral: Negative for depression. The patient is not nervous/anxious.       PHYSICAL EXAMINATION  ECOG PERFORMANCE STATUS: 1 - Symptomatic but completely ambulatory  Vitals:   03/25/17 1427  BP: (!) 146/90  Pulse: (!) 103  Resp: 18  Temp: 98.3 F (36.8 C)    Physical Exam  Constitutional: She is oriented to person, place, and time and well-developed, well-nourished, and in no distress.  HENT:  Head: Normocephalic and atraumatic.  Mouth/Throat: Oropharynx is clear and moist. No oropharyngeal exudate.  Eyes: Pupils are equal, round, and reactive to light. No scleral icterus.  Neck: Neck supple.  Cardiovascular: Normal rate, regular rhythm and normal heart sounds.   Pulmonary/Chest: Effort normal and breath sounds normal.  Abdominal: Soft. Bowel sounds are normal. She exhibits no distension and no mass. There is no tenderness. There is no rebound and no guarding.  Musculoskeletal: She exhibits no edema.  Lymphadenopathy:    She has no cervical adenopathy.  Neurological: She is alert and oriented to person, place, and time.  Psychiatric: Mood and affect normal.    LABORATORY DATA:  CBC    Component Value Date/Time   WBC 10.1 03/25/2017 1357   WBC 8.3 04/23/2016 1551   RBC 4.56 03/25/2017 1357   RBC 3.80 (L) 04/23/2016 1551   HGB 11.7 03/25/2017 1357   HCT 35.2 03/25/2017 1357   PLT 309 03/25/2017 1357   MCV 77.2 (L) 03/25/2017 1357   MCH 25.7 03/25/2017 1357   MCH 27.4 04/23/2016 1551   MCHC 33.4 03/25/2017 1357   MCHC 33.1 04/23/2016 1551   RDW 16.9 (H) 03/25/2017 1357   LYMPHSABS 1.4 03/25/2017 1357   MONOABS 0.6 03/25/2017 1357   EOSABS 0.1 03/25/2017  1357   BASOSABS 0.1 03/25/2017 1357    CMP     Component Value Date/Time   NA 140 03/25/2017 1358   K 3.2 (L) 03/25/2017 1358   CL 104 04/23/2016 1551   CO2 28  03/25/2017 1358   GLUCOSE 103 03/25/2017 1358   BUN 5.5 (L) 03/25/2017 1358   CREATININE 1.0 03/25/2017 1358   CALCIUM 9.6 03/25/2017 1358   PROT 7.4 03/25/2017 1358   ALBUMIN 3.6 03/25/2017 1358   AST 21 03/25/2017 1358   ALT 22 03/25/2017 1358   ALKPHOS 82 03/25/2017 1358   BILITOT 0.34 03/25/2017 1358   GFRNONAA >60 04/23/2016 1551   GFRAA >60 04/23/2016 1551      ASSESSMENT and PLAN:   No problem-specific Assessment & Plan notes found for this encounter.   No orders of the defined types were placed in this encounter.   All questions were answered. The patient knows to call the clinic with any problems, questions or concerns. We can certainly see the patient much sooner if necessary.  A total of (30) minutes of face-to-face time was spent with this patient with greater than 50% of that time in counseling and care-coordination.  This note was electronically signed.  Scot Dock, NP 03/26/2017

## 2017-03-26 ENCOUNTER — Telehealth: Payer: Self-pay

## 2017-03-26 ENCOUNTER — Encounter: Payer: Self-pay | Admitting: Adult Health

## 2017-03-26 ENCOUNTER — Ambulatory Visit: Payer: 59

## 2017-03-26 ENCOUNTER — Other Ambulatory Visit: Payer: Self-pay | Admitting: Surgery

## 2017-03-26 NOTE — Telephone Encounter (Signed)
Attempted to call pt to follow up on her chemo that was done yesterday. Call back number provided in the vm for additional concerns or questions.

## 2017-03-26 NOTE — Assessment & Plan Note (Signed)
Right breast invasive ductal carcinoma multifocal, multicentric disease with at least 8 tumors, 2.3 cm, 1.6 cm of the biggest tumors in addition 6 more tumors 1 cm or less spanning 13.7 cm, right axillary lymph node biopsy positive (ER 22%); primary tumor is ER 0%, PR 0%, HER-2 negative, Ki-67 77% and 73% respectively, grade 3 T3 N1 M0 equals stage IIIa, Grade 3 1. Rt mastectomy 07/28/15: Multifocal IDC 2 cm, 1.5 cm. 0.6 cm; 3/13 LN positive, T1c N1 M0 stage IIA, ER/PR HER-2 negative. 2. Adjuvant XRT 09/03/15 to 10/13/15 3. Adjuvant Xeloda X 6 months (8 cycles) started 11/15/2015 completed July 2017 ----------------------------------------------------------------------- PET-CT scan 03/03/17:  Hypermet rec tumor in right pectoralis musculature, Rt Hilar, Rt Para tracheal and Rt Int Mamm LN mets, Solit Left scapular mets, Sol 8 mm lesion in Rt liver mets, Several sub cm lung nodules.  Rt Axillary LN Biopsy: Triple negative right breast cancer  The patient went to [facility] for a consultation with Dr. Wetzel Bjornstad who reviewed her case and recommended a bronchoscopy and biopsy which was done yesterday. Results are not available yet.  Recommendation: 1. Palliative systemic chemo with Carboplatin-Gemcitabine Days 1 and 8 q 3 weeks Goals of treatment: Shrink the cancers and prolong life. I discussed with her that we cannot cure stage 4 cancer 2. plan to give 3 cycles of chemotherapy depending on the response, radiation afterwards.  Right arm and shoulder pain: I discontinued the percocet since she isn't taking it and it is making her itch.  I also discontinued the gabapentin.  I prescribed Hydrocodone/Acetaminophen 5-325 #90, and Lyrica.  I explained that we will monitor her pain closely and may need to add long acting pain medication in the future.  I reviewed that in combination with the chemotherapy and anti-emetics she needs to ensure she is having regular bowel movements and recommended she pick up a stool  softener and laxative to have on hand just in case.    I reviewed her labs with her in detail.  She will proceed with chemotherapy today.  She will receive this peripherally.  I have sent a message to Dr. Lucia Gaskins to see if/when we can get port scheduled.  It may need to be done in IR.    Kacee will return in one week for labs, an appointment and day 8 of Gemcitabine and Carboplatin.

## 2017-03-27 ENCOUNTER — Ambulatory Visit: Payer: 59

## 2017-03-28 ENCOUNTER — Encounter (HOSPITAL_COMMUNITY): Payer: Self-pay | Admitting: *Deleted

## 2017-03-28 ENCOUNTER — Ambulatory Visit: Payer: 59

## 2017-03-31 ENCOUNTER — Ambulatory Visit: Payer: 59

## 2017-04-01 ENCOUNTER — Encounter: Payer: Self-pay | Admitting: Adult Health

## 2017-04-01 ENCOUNTER — Other Ambulatory Visit (HOSPITAL_BASED_OUTPATIENT_CLINIC_OR_DEPARTMENT_OTHER): Payer: 59

## 2017-04-01 ENCOUNTER — Ambulatory Visit (HOSPITAL_BASED_OUTPATIENT_CLINIC_OR_DEPARTMENT_OTHER): Payer: 59 | Admitting: Adult Health

## 2017-04-01 ENCOUNTER — Ambulatory Visit: Payer: 59

## 2017-04-01 ENCOUNTER — Ambulatory Visit (HOSPITAL_BASED_OUTPATIENT_CLINIC_OR_DEPARTMENT_OTHER): Payer: 59

## 2017-04-01 VITALS — BP 126/76 | HR 101 | Temp 97.9°F | Resp 16

## 2017-04-01 DIAGNOSIS — Z171 Estrogen receptor negative status [ER-]: Secondary | ICD-10-CM | POA: Diagnosis not present

## 2017-04-01 DIAGNOSIS — C50411 Malignant neoplasm of upper-outer quadrant of right female breast: Secondary | ICD-10-CM | POA: Diagnosis not present

## 2017-04-01 DIAGNOSIS — C773 Secondary and unspecified malignant neoplasm of axilla and upper limb lymph nodes: Secondary | ICD-10-CM

## 2017-04-01 DIAGNOSIS — Z5111 Encounter for antineoplastic chemotherapy: Secondary | ICD-10-CM

## 2017-04-01 LAB — COMPREHENSIVE METABOLIC PANEL
ALT: 29 U/L (ref 0–55)
AST: 24 U/L (ref 5–34)
Albumin: 3.7 g/dL (ref 3.5–5.0)
Alkaline Phosphatase: 81 U/L (ref 40–150)
Anion Gap: 9 mEq/L (ref 3–11)
BILIRUBIN TOTAL: 0.31 mg/dL (ref 0.20–1.20)
BUN: 5.9 mg/dL — AB (ref 7.0–26.0)
CO2: 24 mEq/L (ref 22–29)
CREATININE: 0.8 mg/dL (ref 0.6–1.1)
Calcium: 9.8 mg/dL (ref 8.4–10.4)
Chloride: 107 mEq/L (ref 98–109)
EGFR: >90 mL/min/{1.73_m2} (ref >90)
GLUCOSE: 102 mg/dL (ref 70–140)
Potassium: 3.5 mEq/L (ref 3.5–5.1)
SODIUM: 139 meq/L (ref 136–145)
TOTAL PROTEIN: 7.7 g/dL (ref 6.4–8.3)

## 2017-04-01 LAB — CBC WITH DIFFERENTIAL/PLATELET
BASO%: 1 % (ref 0.0–2.0)
Basophils Absolute: 0 10*3/uL (ref 0.0–0.1)
EOS%: 1.3 % (ref 0.0–7.0)
Eosinophils Absolute: 0 10*3/uL (ref 0.0–0.5)
HCT: 35.1 % (ref 34.8–46.6)
HEMOGLOBIN: 11.7 g/dL (ref 11.6–15.9)
LYMPH%: 44 % (ref 14.0–49.7)
MCH: 25.4 pg (ref 25.1–34.0)
MCHC: 33.2 g/dL (ref 31.5–36.0)
MCV: 76.5 fL — ABNORMAL LOW (ref 79.5–101.0)
MONO#: 0.2 10*3/uL (ref 0.1–0.9)
MONO%: 6.5 % (ref 0.0–14.0)
NEUT%: 47.2 % (ref 38.4–76.8)
NEUTROS ABS: 1.5 10*3/uL (ref 1.5–6.5)
PLATELETS: 236 10*3/uL (ref 145–400)
RBC: 4.59 10*6/uL (ref 3.70–5.45)
RDW: 16.7 % — AB (ref 11.2–14.5)
WBC: 3.1 10*3/uL — AB (ref 3.9–10.3)
lymph#: 1.4 10*3/uL (ref 0.9–3.3)

## 2017-04-01 MED ORDER — SODIUM CHLORIDE 0.9 % IV SOLN
2000.0000 mg | Freq: Once | INTRAVENOUS | Status: AC
  Administered 2017-04-01: 2000 mg via INTRAVENOUS
  Filled 2017-04-01: qty 52.6

## 2017-04-01 MED ORDER — DEXAMETHASONE SODIUM PHOSPHATE 10 MG/ML IJ SOLN
INTRAMUSCULAR | Status: AC
Start: 2017-04-01 — End: 2017-04-01
  Filled 2017-04-01: qty 1

## 2017-04-01 MED ORDER — DEXAMETHASONE SODIUM PHOSPHATE 10 MG/ML IJ SOLN
10.0000 mg | Freq: Once | INTRAMUSCULAR | Status: AC
  Administered 2017-04-01: 10 mg via INTRAVENOUS

## 2017-04-01 MED ORDER — PALONOSETRON HCL INJECTION 0.25 MG/5ML
0.2500 mg | Freq: Once | INTRAVENOUS | Status: AC
  Administered 2017-04-01: 0.25 mg via INTRAVENOUS

## 2017-04-01 MED ORDER — SODIUM CHLORIDE 0.9 % IV SOLN
Freq: Once | INTRAVENOUS | Status: AC
  Administered 2017-04-01: 15:00:00 via INTRAVENOUS

## 2017-04-01 MED ORDER — PALONOSETRON HCL INJECTION 0.25 MG/5ML
INTRAVENOUS | Status: AC
  Filled 2017-04-01: qty 5

## 2017-04-01 MED ORDER — SODIUM CHLORIDE 0.9 % IV SOLN
290.8000 mg | Freq: Once | INTRAVENOUS | Status: AC
  Administered 2017-04-01: 290 mg via INTRAVENOUS
  Filled 2017-04-01: qty 29

## 2017-04-01 NOTE — Assessment & Plan Note (Addendum)
Right breast invasive ductal carcinoma multifocal, multicentric disease with at least 8 tumors, 2.3 cm, 1.6 cm of the biggest tumors in addition 6 more tumors 1 cm or less spanning 13.7 cm, right axillary lymph node biopsy positive (ER 22%); primary tumor is ER 0%, PR 0%, HER-2 negative, Ki-67 77% and 73% respectively, grade 3 T3 N1 M0 equals stage IIIa, Grade 3 1. Rt mastectomy 07/28/15: Multifocal IDC 2 cm, 1.5 cm. 0.6 cm; 3/13 LN positive, T1c N1 M0 stage IIA, ER/PR HER-2 negative. 2. Adjuvant XRT 09/03/15 to 10/13/15 3. Adjuvant Xeloda X 6 months (8 cycles) started 11/15/2015 completed July 2017 ----------------------------------------------------------------------- PET-CT scan 03/03/17:  Hypermet rec tumor in right pectoralis musculature, Rt Hilar, Rt Para tracheal and Rt Int Mamm LN mets, Solit Left scapular mets, Sol 8 mm lesion in Rt liver mets, Several sub cm lung nodules.  Rt Axillary LN Biopsy: Triple negative right breast cancer  The patient went to [facility] for a consultation with Dr. Wetzel Bjornstad who reviewed her case and recommended a bronchoscopy and biopsy which was done yesterday. Results are not available yet.  Recommendation: 1. Palliative systemic chemo with Carboplatin-Gemcitabine Days 1 and 8 q 3 weeks Goals of treatment: Shrink the cancers and prolong life. I discussed with her that we cannot cure stage 4 cancer 2. plan to give 3 cycles of chemotherapy depending on the response, radiation afterwards.  Phyllis Pruitt is doing well today.  I recommended she double her lyrica and take '50mg'$  BID instead of '25mg'$  BID.  She says she may start doing this.  She will proceed with cycle 1 day 8 of Gemcitabine/Carboplatin today.  She seems to be tolerating it well.  She will return in two weeks for labs and an appt with Dr. [name] prior to cycle 2 of treatment.

## 2017-04-01 NOTE — Patient Instructions (Signed)
[facility] Discharge Instructions for Patients Receiving Chemotherapy  Today you received the following chemotherapy agents Gemzar, Carboplatin.   To help prevent nausea and vomiting after your treatment, we encourage you to take your nausea medication as prescribed.    If you develop nausea and vomiting that is not controlled by your nausea medication, call the clinic.   BELOW ARE SYMPTOMS THAT SHOULD BE REPORTED IMMEDIATELY:  *FEVER GREATER THAN 100.5 F  *CHILLS WITH OR WITHOUT FEVER  NAUSEA AND VOMITING THAT IS NOT CONTROLLED WITH YOUR NAUSEA MEDICATION  *UNUSUAL SHORTNESS OF BREATH  *UNUSUAL BRUISING OR BLEEDING  TENDERNESS IN MOUTH AND THROAT WITH OR WITHOUT PRESENCE OF ULCERS  *URINARY PROBLEMS  *BOWEL PROBLEMS  UNUSUAL RASH Items with * indicate a potential emergency and should be followed up as soon as possible.  Feel free to call the clinic you have any questions or concerns. The clinic phone number is [phone].  Please show the CHEMO ALERT CARD at check-in to the Emergency Department and triage nurse.   

## 2017-04-01 NOTE — Progress Notes (Signed)
[facility] Follow up:    Phyllis Pillar, MD [address] [place] [address]   DIAGNOSIS: Cancer Staging Breast cancer of upper-outer quadrant of right female breast [facility]) Staging form: Breast, AJCC 7th Edition - Clinical stage from 02/08/2015: Stage IIIA (T3, N1, M0) - Unsigned - Pathologic stage from 07/28/2015: Stage IIA (yT1c, N1a, cM0) - Unsigned   SUMMARY OF ONCOLOGIC HISTORY:   Breast cancer of upper-outer quadrant of right female breast ([place])   01/19/2015 Mammogram    Right breast: density with indistinct margin at 12:00, posterior depth; also oval mass with obscured margin at 11:00, middle depth; irregular mass with indistinct margin central to nipple, middle depth      01/23/2015 Initial Biopsy    Right breast 9:00 and 10:00 biopsy ([facility]): Grade 3 IDC ER/PR negative, HER-2 negative, Ki-67 77% and 73%; the right axillary lymph node biopsy: ER 22% positive      01/30/2015 Breast MRI    Right breast masses 9 and 10:00 position, additional irregular masses with non-mass enhancement upper outer quadrant with one mass extending into upper inner quadrant, multifocal, multicentric 13.7 x 4.6 x 4.6 cm, malignant right axillary lymph node      02/08/2015 Clinical Stage    Stage IIIA: T3, N1, M0      02/09/2015 Procedure    BreastNext panel revealed VUS BRCA2 gene called [id number] (c.[id number]>G); otherwise no clinically significant variant at ATM, BARD1, BRCA1, BRCA2, BRIP1, CDH1, CHEK2, MRE11A, MUTYH, NBN, NF1, PALB2, PTEN, RAD50, RAD51C, RAD51D, and TP53      02/15/2015 Imaging    No evidence of metastatic disease, 2.2 cm enhancing lesion anterior left lower kidney suspicious for solid renal neoplasm      02/20/2015 - 07/10/2015 Neo-Adjuvant Chemotherapy    dose dense doxorubicin and cyclophosphamide x 4 followed by Abraxane weekly x 12      07/10/2015 Breast MRI    Multiple irregular enhancing masses right breast are less confluent and smaller  dominant mass 10:00 2.1 x 1.9 x 1.3 cm; right breast 11:00 mass measures 1.2 x 1.2 x 0.8 cm, decreased right axillary lymph node thickening      07/28/2015 Definitive Surgery    Rt mastectomy: Multifocal IDC 2 cm, 1.5 cm. 0.6 cm; 3/13 LN positive, ER/PR HER-2      07/28/2015 Pathologic Stage    Stage IIA: T1c N1 M0        09/03/2015 - 10/13/2015 Radiation Therapy    Adjuvant radiation therapy: Right chest wall/50.4 [place] Cables @ 1.8 Gray per fraction x 28 fractions. Right supraclavicular fossa 45 Gy _0 .8 Gy per fraction x 25 fractions      11/15/2015 - 05/10/2016 Chemotherapy    Adjuvant capecitabine - completed 8 cycles      11/16/2015 Survivorship    Survivorship visit completed and copy of care plan given to patient      02/23/2016 Procedure    Left mandible biopsy: Scant material, no malignancy      05/21/2016 -  Anti-estrogen oral therapy    Patient did not take tamoxifen because her ER was weakly positive at 22% and she was worried about toxicities      02/21/2017 Imaging    Rt Axilla 8 X 7 X 7 mm mass inv pectoralis musculature, additional mass 9 mm 1.5 cm apart      02/21/2017 Relapse/Recurrence    Right axillary mass biopsy: 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       Renal cell  carcinoma of left kidney ([place])   01/29/2016 Surgery    Left renal mass partial resection: 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 female returns for evaluation prior to receiving Gemcitabine/Carboplatin.  She tolerated last week well.  She continues to have pain in her right arm that will radiate down to her fingers.  It is improved from last week.  Last week her pain was an 8, today it was a 7.  She was started on Lyrica last week which she is tolerating well.  She has not taken Hydrocodone for pain since I saw her  last.     Patient Active Problem List   Diagnosis Date Noted  . Acquired absence of breast and nipple 06/03/2016  . Renal cell carcinoma of left kidney ([place]) 02/06/2016  . Left ovarian cyst 01/15/2016  . Renal mass 12/29/2015  . 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 Genetic testing 03/07/2015  . Left renal mass 02/17/2015  . Breast cancer of upper-outer quadrant of right female breast ([place]) 01/27/2015    is allergic to sulfa antibiotics.  MEDICAL HISTORY: Past Medical History:  Diagnosis Date  . Anemia    during pregnancy  . Breast cancer of upper-outer quadrant of right female breast ([place]) 01/27/2015  . Depression    hx denies any problems now  . History of kidney cancer 12/2015   left removed "spot"  . Lymphedema    rt arm  . Neuropathy   . Sickle cell trait ([place])     SURGICAL HISTORY: Past Surgical History:  Procedure Laterality Date  . LATISSIMUS FLAP TO BREAST Right 06/03/2016   Procedure: RIGHT LATISSIMUS FLAP TO BREAST;  Surgeon: Irene Limbo, MD;  Location: [place];  Service: Plastics;  Laterality: Right;  . LIPOSUCTION WITH LIPOFILLING Right 09/20/2016   Procedure: LIPOSUCTION WITH LIPOFILLING;  Surgeon: Irene Limbo, MD;  Location: [place];  Service: Plastics;  Laterality: Right;  LIPOSUCTION WITH LIPOFILLING  . MASTECTOMY    . MASTECTOMY MODIFIED RADICAL Right 07/28/2015  . MASTECTOMY MODIFIED RADICAL Right 07/28/2015   Procedure: RIGHT MODIFIED RADICAL MASTECTOMY;  Surgeon: Alphonsa Overall, MD;  Location: [place];  Service: General;  Laterality: Right;  . PORT-A-CATH REMOVAL  07/28/2015  . PORT-A-CATH REMOVAL  07/28/2015   Procedure: REMOVAL PORT-A-CATH;  Surgeon: Alphonsa Overall, MD;  Location: [place];  Service: General;;  . PORTACATH PLACEMENT N/A 02/10/2015   Procedure: INSERTION PORT-A-CATH WITH ULTRA SOUND, left subclavian,;  Surgeon: Alphonsa Overall, MD;  Location: [facility];  Service: General;  Laterality: N/A;  .  REMOVAL OF TISSUE EXPANDER AND PLACEMENT OF IMPLANT Right 09/20/2016   Procedure: REMOVAL OF RIGHT TISSUE EXPANDER WITH PLACEMENT OF RIGHT SILICONE BREAST IMPLANTS LIPOFILLING FROM ABDOMEN TO RIGHT CHEST;  Surgeon: Irene Limbo, MD;  Location: [place];  Service: Plastics;  Laterality: Right;  PLACEMENT OF RIGHT SILICONE BREAST IMPLANTS LIPOFILLING FROM ABDOMEN TO RIGHT CHEST  . ROBOTIC ASSITED PARTIAL NEPHRECTOMY Left 12/29/2015   Procedure: XI ROBOTIC ASSITED PARTIAL NEPHRECTOMY;  Surgeon: Cleon Gustin, MD;  Location: [facility];  Service: Urology;  Laterality: Left;  . TISSUE EXPANDER PLACEMENT Right 06/03/2016   Procedure: PLACEMENT RIGHT TISSUE EXPANDER;  Surgeon: Irene Limbo, MD;  Location: [place];  Service: Plastics;  Laterality: Right;  . TONSILLECTOMY    .  TUBAL LIGATION    . wisdome teeth extraction      SOCIAL HISTORY: Social History   Social History  . Marital status: Married    Spouse name: N/A  . Number of children: N/A  . Years of education: N/A   Occupational History  . Not on file.   Social History Main Topics  . Smoking status: Never Smoker  . Smokeless tobacco: Never Used  . Alcohol use No  . Drug use: No  . Sexual activity: Not on file   Other Topics Concern  . Not on file   Social History Narrative  . No narrative on file    FAMILY HISTORY: No family history on file.  Review of Systems  Constitutional: Negative for appetite change, chills, diaphoresis, fatigue, fever and unexpected weight change.  HENT:   Negative for hearing loss and lump/mass.   Eyes: Negative for eye problems and icterus.  Respiratory: Negative for chest tightness, cough and shortness of breath.   Cardiovascular: Negative for chest pain, leg swelling and palpitations.  Gastrointestinal: Negative for abdominal distention, abdominal pain, constipation, diarrhea, nausea and vomiting.  Endocrine: Negative for hot flashes.  Genitourinary: Negative for  difficulty urinating.   Musculoskeletal: Negative for arthralgias.  Skin: Negative for itching and rash.  Neurological: Negative for dizziness, extremity weakness and headaches.  Hematological: Negative for adenopathy. Does not bruise/bleed easily.  Psychiatric/Behavioral: Negative for depression. The patient is not nervous/anxious.       PHYSICAL EXAMINATION  ECOG PERFORMANCE STATUS: 1 - Symptomatic but completely ambulatory  Vitals:   04/01/17 1017  BP: (!) 158/93  Pulse: 95  Resp: 18  Temp: 98.5 F (36.9 C)    Physical Exam  Constitutional: She is oriented to person, place, and time and well-developed, well-nourished, and in no distress.  HENT:  Head: Normocephalic and atraumatic.  Mouth/Throat: Oropharynx is clear and moist. No oropharyngeal exudate.  Eyes: Pupils are equal, round, and reactive to light. No scleral icterus.  Neck: Neck supple.  Cardiovascular: Normal rate, regular rhythm and normal heart sounds.   Pulmonary/Chest: Effort normal and breath sounds normal. No respiratory distress. She has no wheezes. She has no rales.  Abdominal: Soft. Bowel sounds are normal.  Musculoskeletal: She exhibits no edema.  Lymphadenopathy:    She has no cervical adenopathy.  Neurological: She is alert and oriented to person, place, and time.  Skin: Skin is warm and dry. No rash noted.  Psychiatric: Mood and affect normal.    LABORATORY DATA:  CBC    Component Value Date/Time   WBC 3.1 (L) 04/01/2017 0953   WBC 8.3 04/23/2016 1551   RBC 4.59 04/01/2017 0953   RBC 3.80 (L) 04/23/2016 1551   HGB 11.7 04/01/2017 0953   HCT 35.1 04/01/2017 0953   PLT 236 04/01/2017 0953   MCV 76.5 (L) 04/01/2017 0953   MCH 25.4 04/01/2017 0953   MCH 27.4 04/23/2016 1551   MCHC 33.2 04/01/2017 0953   MCHC 33.1 04/23/2016 1551   RDW 16.7 (H) 04/01/2017 0953   LYMPHSABS 1.4 04/01/2017 0953   MONOABS 0.2 04/01/2017 0953   EOSABS 0.0 04/01/2017 0953   BASOSABS 0.0 04/01/2017 0953     CMP     Component Value Date/Time   NA 139 04/01/2017 0953   K 3.5 04/01/2017 0953   CL 104 04/23/2016 1551   CO2 24 04/01/2017 0953   GLUCOSE 102 04/01/2017 0953   BUN 5.9 (L) 04/01/2017 0953   CREATININE 0.8 04/01/2017 0953   CALCIUM  9.8 04/01/2017 0953   PROT 7.7 04/01/2017 0953   ALBUMIN 3.7 04/01/2017 0953   AST 24 04/01/2017 0953   ALT 29 04/01/2017 0953   ALKPHOS 81 04/01/2017 0953   BILITOT 0.31 04/01/2017 0953   GFRNONAA >60 04/23/2016 1551   GFRAA >60 04/23/2016 1551     ASSESSMENT and PLAN:   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 Rt mastectomy 07/28/15: Multifocal IDC 2 cm, 1.5 cm. 0.6 cm; 3/13 LN positive, T1c N1 M0 stage IIA, ER/PR HER-2 negative. 2. Adjuvant XRT 09/03/15 to 10/13/15 3. Adjuvant Xeloda X 6 months (8 cycles) started 11/15/2015 completed July 2017 ----------------------------------------------------------------------- PET-CT scan 03/03/17:  Hypermet rec tumor in right pectoralis musculature, Rt Hilar, Rt Para tracheal and Rt Int Mamm LN mets, Solit Left scapular mets, Sol 8 mm lesion in Rt liver mets, Several sub cm lung nodules.  Rt Axillary LN Biopsy: Triple negative right breast cancer  The patient went to [facility] for a consultation with Dr. Wetzel Bjornstad who reviewed her case and recommended a bronchoscopy and biopsy which was done yesterday. Results are not available yet.  Recommendation: 1. Palliative systemic chemo with Carboplatin-Gemcitabine Days 1 and 8 q 3 weeks Goals of treatment: Shrink the cancers and prolong life. I discussed with her that we cannot cure  stage 4 cancer 2. plan to give 3 cycles of chemotherapy depending on the response, radiation afterwards.  Kynslee is doing well today.  I recommended she double her lyrica and take '50mg'$  BID instead of '25mg'$  BID.  She says she may start doing this.  She will proceed with cycle 1 day 8 of Gemcitabine/Carboplatin today.  She seems to be tolerating it well.  She will return in two weeks for labs and an appt with Dr. [name] prior to cycle 2 of treatment.     All questions were answered. The patient knows to call the clinic with any problems, questions or concerns. We can certainly see the patient much sooner if necessary.  A total of (30) minutes of face-to-face time was spent with this patient with greater than 50% of that time in counseling and care-coordination.  This note was electronically signed. Scot Dock, NP 04/01/2017

## 2017-04-02 ENCOUNTER — Ambulatory Visit: Payer: 59

## 2017-04-03 ENCOUNTER — Telehealth: Payer: Self-pay | Admitting: Medical Oncology

## 2017-04-03 ENCOUNTER — Ambulatory Visit: Payer: 59

## 2017-04-03 NOTE — Telephone Encounter (Signed)
LVMOM with patient informing her of the out of range lab results from PREVENT 24- month visit (completed on 03/14/17).  Informed patient that Dr. [name] has reviewed the results, no new orders given. Informed patient that Cardiac MRI results are pending and as soon as I receive those results, I will call her. Encouraged patient to call with questions. Results scanned to patient's MedRec in Epic.  Adele Dan, RN, BSN Clinical Research 04/03/2017 4:03 PM

## 2017-04-03 NOTE — H&P (Signed)
Phyllis Pruitt  Location: [facility] Surgery Patient #: [id number] DOB: Oct 16, 1970 Married / Language: English / Race: Black or African American Female  History of Present Illness   Patient words: breast cancer.  The patient is a 46 year old female who presents with breast cancer.   Her primary care physician is Phyllis Divine, MD  She was seen at the [facility] for right breast cancer.  Oncology - Phyllis Pruitt and Phyllis Pruitt. She comes by herself. Dr. Pamelia Pruitt talked to me about her on 02/25/2017. I had not seen [name] since Jan 2017.  About 3-4 weeks ago, she noticed increasing pain in her right arm and some right arm numbness. She went to PT and was seen by Phyllis Pruitt who felt a nodule in her right axilla/chest. This prompted an evaluation by Dr. Pamelia Pruitt, an [place] on 02/21/2017 which shows two nodules - 8 x 7 mm and 9 x 7 mm about 1.5 cm apart. On of these were biopsies which showed recurrent breast ca. She had a biopsy on 02/21/2017 [phone]) which showed metastatic ca involving muscle, it is ER/PR - negative, Her2Neu - neg. She had a biopsy of the other nodule today - so that path is pending. She is for a MRI of the breast on 03/01/2017. She is for a PET scan on 03/03/2017. She'll see Dr. Pamelia Pruitt back on 03/05/2017.  History of breast cancer (01/2015): She got sick in March. She noticed a lump in her right breast around 12/22/2014. She had never had mammograms before. She had no breast problems before she got sick. She attributed the changes in her breast to her illness first. I think that she had some dental issues.  She had mammograms through [facility] on 01/19/2015. She had at least 6 lesions identified. And her right axillary node was abnormal. Her breast MRI - 01/30/2015 - Masses compatible with biopsy-proven invasive ductal carcinoma with clip artifact at the 9 and 10 o'clock positions of the right breast. Approximately 6 additional  enhancing irregular masses with associated non mass enhancement in the upper-outer quadrant of the right breast with 1 mass extending into the upper inner quadrant. The most anterior mass lies in the upper-outer right retroareolar region. The non mass enhancement extends into the outer lower quadrant. Findings likely represent multifocal, multicentric disease as these abnormalities measure approximately 13.7 x 4.6 x 4.6 cm together. Abnormal biopsy-proven malignant right axillary lymph node. Pathology - IDC, Grade III, ER/PR - neg, Her2Neu neg, Ki67 - 77%. Right axillary node positive.  She had neoadjuvant chemotx (doxorubicin and cyclophosphamide, followed by Abraxane) - 02/20/2015 - 07/10/2015. She underwent a right modified radical mastecotmy - 07/28/2015 - [id number] - Multifocal Grade III invasive ductal ca, largest tumor measures 2.0 cm, 3/13 nodes positive, triple negative (though lymph node was ER - 22%).  She completed rad tx with Dr. Michell Pruitt in November - December, 2016 She had Adjuvant chemotx with Xeloda x 6 months - 11/15/2015 - 04/2016 She then had reconstruction with Dr. Leta Pruitt with a right latissimus flap and tissue expander on 06/03/2016.  Past Medical History: 1. 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 2. Genetics were negative.  SOCIAL and FAMILY HISTORY: Husband, Phyllis Pruitt. He works for [employer] for [employer] They were married in May 2016. She has 4 children: 86 yo daughter, 27 yo daughter (at [facility]), 53 yo son, and 51 yo son. 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    Problem List/Past Medical Phyllis Pruitt, CMA; 02/27/2017 4:07 PM) CHANGE OR REMOVAL OF DRAINS (Z48.03)  s/p R modified radical mastectomy and removal of PAC 07/28/15. Eval JP drain output and possible removal if under 30cc x 2-3 days KIDNEY LESION,  NATIVE, LEFT (N28.9)  BREAST CANCER, STAGE 3, RIGHT (C50.911)  April 2016 - Abnormal biopsy - proven malignant right axillary lymph node. Pathology - IDC, Grade III, ER/PR - neg, Her2Neu neg, Ki67 - 77%. Completed neoadjuvant chemotx Right modified radical mastecotmy - 07/28/2015 - [id number] - Multifocal Grade III invasive ductal ca, largest measures 2.0 cm, 3/13 nodes positive, triple negative (though lymph node was ER - 22%) Completed Radation tx - 10/13/2015 Treating oncology - Phyllis Pruitt  Past Surgical History Phyllis Pruitt, CMA; 02/27/2017 4:05 PM) Breast Mass; Local Excision  Right. Oral Surgery  Tonsillectomy   Diagnostic Studies History Phyllis Pruitt, CMA; 02/27/2017 4:05 PM) Colonoscopy  within last year Mammogram  within last year Pap Smear  1-5 years ago  Allergies Phyllis Pruitt, CMA; 02/27/2017 4:05 PM) No Known Drug Allergies 07/12/2015  Medication History Phyllis Pruitt, CMA; 02/27/2017 4:07 PM) Ginger Root ('250MG'$  Capsule, Oral) Active. Multivitamin Adult (Oral) Active. Turmeric Curcumin (Oral) Active. Benazepril HCl ('10MG'$  Tablet, Oral) Active. Hydrocodone-Acetaminophen (5-'325MG'$  Tablet, Oral) Active. Robaxin ('500MG'$  Tablet, Oral) Active. Black Pepper-Turmeric (3-'500MG'$  Capsule, Oral) Active. Medications Reconciled  Social History Phyllis Pruitt, CMA; 02/27/2017 4:07 PM) No alcohol use  No caffeine use  No drug use  Tobacco use  Never smoker.  Family History Phyllis Pruitt, CMA; 02/27/2017 4:07 PM) Alcohol Abuse  Family Members In General. Cerebrovascular Accident  Father. Depression  Family Members In General, Father. Hypertension  Mother.  Pregnancy / Birth History Phyllis Pruitt, CMA; 02/27/2017 4:07 PM) Age at menarche  73 years. Contraceptive History  Contraceptive implant, Depo-provera, Intrauterine device, Oral contraceptives. Gravida  4 Maternal age  65-20 Para  4 Regular periods    Other Problems Phyllis Pruitt, CMA; 02/27/2017 4:07 PM) Breast Cancer  Chest pain  Lump In Breast  Sickle cell disease   Vitals Phyllis Pruitt CMA; 02/27/2017 4:05 PM) 02/27/2017 4:04 PM Weight: 197.13 lb Height: 65.5in Body Surface Area: 1.98 m Body Mass Index: 32.3 kg/m  Pulse: 88 (Regular)  BP: 142/90 (Sitting, Left Arm, Standard)   Physical Exam  General: WN AA F alert.  HEENT: Normal. Pupils equal.  Neck: Supple. No mass. No thyroid mass.  Lymph Nodes: No supraclavicular or cervical nodes. On the right, anterior to the pectoralis muscle - there is an approximate 1.0 cm nodule that is tender.  Heart - RRR, no murmur Lungs - clear. She has a scar on her right back from the latissimus flap.  Breasts: Right - Reconstructed right breast - pigmented skin that was original and less pigmented skin from the latisimus flap that Dr. Iran Planas rotated around on the right. Left - Scar from power port in UIQ looks good. No mass  Extremities: Good strength, but limited ROM. She can abduct the right arm about 90 degrees, but then has significant pain. She has a good grip in both hands and strength in upper arm and forearm.   Assessment & Plan  1.  RECURRENT BREAST CANCER, RIGHT (C50.911)  Plan:   1. She is for a MRI of the breast on 03/01/2017.   2.     1. Hypermetabolic infiltrative locally recurrent tumor in the upper right pectoralis musculature and in the  right axilla.    2. Hypermetabolic right hilar, right paratracheal and right internal mammary nodal metastases .    3. Solitary hypermetabolic segment 8 right liver lobe metastasis.    4. Solitary hypermetabolic left scapular osseous metastasis.    5. Several new solid subcentimeter pulmonary nodules in the right lung, below PET resolution, suspicious for pulmonary metastases.  Recommend attention on follow-up chest CT in 3 months.   3. She'll see Dr. Lindi Pruitt back on  03/05/2017.    Addendum Note(Ruffin Lada H. Lucia Gaskins MD; 03/13/2017 7:13 AM)    Notes from [place].    Dr. Lisbeth Renshaw thinks that he can give additional supraclavicular rad tx.    Dr. Lindi Pruitt has talked to her about carboplatin/gemcitabine as chemotx    She is to see Dr. Harriett Rush at [facility] for triple neg ca trial options.   4. .    Addendum Note(Clarke Peretz H. Lucia Gaskins MD; 03/26/2017 1:48 PM)    Gardenia Phlegm, NP sent to Alphonsa Overall, MD    Patient needs a port placed. She is beginning chemotherapy today. Can you arrange ASAP? Thanks, Mendel Ryder   She has had 2 courses of treatment so far and has had some improvement in the right arm pain.  2.  BREAST CANCER, STAGE 3, RIGHT (T06.269)  Story: April 2016 - Abnormal biopsy - proven malignant right axillary lymph node.  Pathology - IDC, Grade III, ER/PR - neg, Her2Neu neg, Ki67 - 77%.  Completed neoadjuvant chemotx  Right modified radical mastecotmy - 07/28/2015 - [id number] - Multifocal Grade III invasive ductal ca, largest measures 2.0 cm, 3/13 nodes positive, triple negative (though lymph node was ER - 22%)  Completed Radation tx - 10/13/2015  Treating oncology - Phyllis Pruitt  3.  KIDNEY LESION, NATIVE, LEFT (N28.9)  Story: Removed by Dr. Thera Flake on 12/28/2016  Path - Clear cell renal cell carcinoma - 2.0 cm     Alphonsa Overall, MD, [facility] Surgery Pager: [phone] Office phone:  [phone]

## 2017-04-04 ENCOUNTER — Ambulatory Visit (HOSPITAL_COMMUNITY): Payer: 59

## 2017-04-04 ENCOUNTER — Telehealth: Payer: Self-pay

## 2017-04-04 ENCOUNTER — Encounter (HOSPITAL_COMMUNITY): Payer: Self-pay | Admitting: *Deleted

## 2017-04-04 ENCOUNTER — Ambulatory Visit: Payer: 59

## 2017-04-04 ENCOUNTER — Ambulatory Visit (HOSPITAL_COMMUNITY)
Admission: RE | Admit: 2017-04-04 | Discharge: 2017-04-04 | Disposition: A | Discharge status: Home / Self Care | Payer: 59 | Source: Ambulatory Visit | Attending: Surgery | Admitting: Surgery

## 2017-04-04 ENCOUNTER — Ambulatory Visit (HOSPITAL_COMMUNITY): Payer: 59 | Admitting: Anesthesiology

## 2017-04-04 ENCOUNTER — Encounter (HOSPITAL_COMMUNITY)
Admission: RE | Disposition: A | Discharge status: Home / Self Care | Payer: Self-pay | Source: Ambulatory Visit | Attending: Surgery

## 2017-04-04 DIAGNOSIS — Z923 Personal history of irradiation: Secondary | ICD-10-CM | POA: Diagnosis not present

## 2017-04-04 DIAGNOSIS — R918 Other nonspecific abnormal finding of lung field: Secondary | ICD-10-CM | POA: Diagnosis not present

## 2017-04-04 DIAGNOSIS — Z85528 Personal history of other malignant neoplasm of kidney: Secondary | ICD-10-CM | POA: Diagnosis not present

## 2017-04-04 DIAGNOSIS — C7951 Secondary malignant neoplasm of bone: Secondary | ICD-10-CM | POA: Diagnosis not present

## 2017-04-04 DIAGNOSIS — Z9221 Personal history of antineoplastic chemotherapy: Secondary | ICD-10-CM | POA: Insufficient documentation

## 2017-04-04 DIAGNOSIS — Z79899 Other long term (current) drug therapy: Secondary | ICD-10-CM | POA: Diagnosis not present

## 2017-04-04 DIAGNOSIS — C771 Secondary and unspecified malignant neoplasm of intrathoracic lymph nodes: Secondary | ICD-10-CM | POA: Insufficient documentation

## 2017-04-04 DIAGNOSIS — Z9011 Acquired absence of right breast and nipple: Secondary | ICD-10-CM | POA: Diagnosis not present

## 2017-04-04 DIAGNOSIS — C7989 Secondary malignant neoplasm of other specified sites: Secondary | ICD-10-CM | POA: Diagnosis not present

## 2017-04-04 DIAGNOSIS — C50911 Malignant neoplasm of unspecified site of right female breast: Secondary | ICD-10-CM | POA: Insufficient documentation

## 2017-04-04 DIAGNOSIS — C787 Secondary malignant neoplasm of liver and intrahepatic bile duct: Secondary | ICD-10-CM | POA: Insufficient documentation

## 2017-04-04 DIAGNOSIS — Z853 Personal history of malignant neoplasm of breast: Secondary | ICD-10-CM | POA: Insufficient documentation

## 2017-04-04 DIAGNOSIS — Z95828 Presence of other vascular implants and grafts: Secondary | ICD-10-CM

## 2017-04-04 HISTORY — PX: PORTACATH PLACEMENT: SHX2246

## 2017-04-04 LAB — PREGNANCY, URINE: Preg Test, Ur: NEGATIVE

## 2017-04-04 SURGERY — INSERTION, TUNNELED CENTRAL VENOUS DEVICE, WITH PORT
Anesthesia: General

## 2017-04-04 MED ORDER — DEXAMETHASONE SODIUM PHOSPHATE 10 MG/ML IJ SOLN
INTRAMUSCULAR | Status: AC
  Filled 2017-04-04: qty 1

## 2017-04-04 MED ORDER — PROMETHAZINE HCL 25 MG/ML IJ SOLN: 6.2500 mg | INTRAMUSCULAR | Status: DC | PRN

## 2017-04-04 MED ORDER — BUPIVACAINE HCL (PF) 0.25 % IJ SOLN
INTRAMUSCULAR | Status: DC | PRN
  Administered 2017-04-04: 15 mL

## 2017-04-04 MED ORDER — MIDAZOLAM HCL 2 MG/2ML IJ SOLN
INTRAMUSCULAR | Status: AC
  Filled 2017-04-04: qty 2

## 2017-04-04 MED ORDER — ONDANSETRON HCL 4 MG/2ML IJ SOLN
INTRAMUSCULAR | Status: AC
  Filled 2017-04-04: qty 2

## 2017-04-04 MED ORDER — PROPOFOL 10 MG/ML IV BOLUS
INTRAVENOUS | Status: AC
  Filled 2017-04-04: qty 20

## 2017-04-04 MED ORDER — FENTANYL CITRATE (PF) 100 MCG/2ML IJ SOLN
INTRAMUSCULAR | Status: DC | PRN
  Administered 2017-04-04 (×2): 50 ug via INTRAVENOUS

## 2017-04-04 MED ORDER — HEPARIN SOD (PORK) LOCK FLUSH 100 UNIT/ML IV SOLN
INTRAVENOUS | Status: AC
  Filled 2017-04-04: qty 10

## 2017-04-04 MED ORDER — DEXAMETHASONE SODIUM PHOSPHATE 10 MG/ML IJ SOLN
INTRAMUSCULAR | Status: DC | PRN
  Administered 2017-04-04: 10 mg via INTRAVENOUS

## 2017-04-04 MED ORDER — SODIUM CHLORIDE 0.9 % IV SOLN
Freq: Once | INTRAVENOUS | Status: DC
  Filled 2017-04-04: qty 1.2

## 2017-04-04 MED ORDER — PROPOFOL 10 MG/ML IV BOLUS
INTRAVENOUS | Status: DC | PRN
  Administered 2017-04-04: 200 mg via INTRAVENOUS

## 2017-04-04 MED ORDER — FENTANYL CITRATE (PF) 100 MCG/2ML IJ SOLN
INTRAMUSCULAR | Status: AC
Start: 2017-04-04 — End: 2017-04-04
  Filled 2017-04-04: qty 2

## 2017-04-04 MED ORDER — CEFAZOLIN SODIUM-DEXTROSE 2-4 GM/100ML-% IV SOLN
2.0000 g | INTRAVENOUS | Status: AC
  Administered 2017-04-04: 2 g via INTRAVENOUS
  Filled 2017-04-04: qty 100

## 2017-04-04 MED ORDER — BUPIVACAINE HCL (PF) 0.25 % IJ SOLN
INTRAMUSCULAR | Status: AC
  Filled 2017-04-04: qty 30

## 2017-04-04 MED ORDER — GABAPENTIN 300 MG PO CAPS
300.0000 mg | ORAL_CAPSULE | ORAL | Status: AC
  Administered 2017-04-04: 300 mg via ORAL
  Filled 2017-04-04: qty 1

## 2017-04-04 MED ORDER — LIDOCAINE 2% (20 MG/ML) 5 ML SYRINGE
INTRAMUSCULAR | Status: DC | PRN
  Administered 2017-04-04: 75 mg via INTRAVENOUS

## 2017-04-04 MED ORDER — LACTATED RINGERS IV SOLN: INTRAVENOUS | Status: DC

## 2017-04-04 MED ORDER — ONDANSETRON HCL 4 MG/2ML IJ SOLN
INTRAMUSCULAR | Status: DC | PRN
  Administered 2017-04-04: 4 mg via INTRAVENOUS

## 2017-04-04 MED ORDER — BUPIVACAINE-EPINEPHRINE (PF) 0.25% -1:200000 IJ SOLN
INTRAMUSCULAR | Status: AC
  Filled 2017-04-04: qty 30

## 2017-04-04 MED ORDER — HEPARIN SOD (PORK) LOCK FLUSH 100 UNIT/ML IV SOLN
INTRAVENOUS | Status: DC | PRN
  Administered 2017-04-04: 500 [IU] via INTRAVENOUS

## 2017-04-04 MED ORDER — LACTATED RINGERS IV SOLN
INTRAVENOUS | Status: DC | PRN
  Administered 2017-04-04: 07:00:00 via INTRAVENOUS

## 2017-04-04 MED ORDER — LIDOCAINE HCL 1 % IJ SOLN
INTRAMUSCULAR | Status: AC
  Filled 2017-04-04: qty 20

## 2017-04-04 MED ORDER — MIDAZOLAM HCL 5 MG/5ML IJ SOLN
INTRAMUSCULAR | Status: DC | PRN
  Administered 2017-04-04: 2 mg via INTRAVENOUS

## 2017-04-04 MED ORDER — ACETAMINOPHEN 500 MG PO TABS
1000.0000 mg | ORAL_TABLET | ORAL | Status: AC
  Administered 2017-04-04: 1000 mg via ORAL
  Filled 2017-04-04: qty 2

## 2017-04-04 MED ORDER — CHLORHEXIDINE GLUCONATE CLOTH 2 % EX PADS: 6.0000 | MEDICATED_PAD | Freq: Once | CUTANEOUS | Status: DC

## 2017-04-04 MED ORDER — FENTANYL CITRATE (PF) 100 MCG/2ML IJ SOLN: 25.0000 ug | INTRAMUSCULAR | Status: DC | PRN

## 2017-04-04 MED ORDER — LIDOCAINE 2% (20 MG/ML) 5 ML SYRINGE
INTRAMUSCULAR | Status: AC
Start: 2017-04-04 — End: 2017-04-04
  Filled 2017-04-04: qty 5

## 2017-04-04 SURGICAL SUPPLY — 32 items
ADH SKN CLS APL DERMABOND .7 (GAUZE/BANDAGES/DRESSINGS) ×1
APL SKNCLS STERI-STRIP NONHPOA (GAUZE/BANDAGES/DRESSINGS)
BAG DECANTER FOR FLEXI CONT (MISCELLANEOUS) ×3 IMPLANT
BENZOIN TINCTURE PRP APPL 2/3 (GAUZE/BANDAGES/DRESSINGS) IMPLANT
BLADE SURG 15 STRL LF DISP TIS (BLADE) ×1 IMPLANT
BLADE SURG 15 STRL SS (BLADE) ×3
CHLORAPREP W/TINT 26ML (MISCELLANEOUS) ×3 IMPLANT
COVER PROBE U/S 5X48 (MISCELLANEOUS) IMPLANT
COVER SURGICAL LIGHT HANDLE (MISCELLANEOUS) ×3 IMPLANT
DECANTER SPIKE VIAL GLASS SM (MISCELLANEOUS) ×3 IMPLANT
DERMABOND ADVANCED (GAUZE/BANDAGES/DRESSINGS) ×2
DERMABOND ADVANCED .7 DNX12 (GAUZE/BANDAGES/DRESSINGS) IMPLANT
DRAPE C-ARM 42X120 X-RAY (DRAPES) ×3 IMPLANT
DRAPE LAPAROSCOPIC ABDOMINAL (DRAPES) ×3 IMPLANT
ELECT PENCIL ROCKER SW 15FT (MISCELLANEOUS) ×3 IMPLANT
ELECT REM PT RETURN 15FT ADLT (MISCELLANEOUS) ×3 IMPLANT
GAUZE SPONGE 2X2 8PLY STRL LF (GAUZE/BANDAGES/DRESSINGS) IMPLANT
GAUZE SPONGE 4X4 16PLY XRAY LF (GAUZE/BANDAGES/DRESSINGS) ×3 IMPLANT
GLOVE SURG SIGNA 7.5 PF LTX (GLOVE) ×3 IMPLANT
GOWN STRL REUS W/TWL XL LVL3 (GOWN DISPOSABLE) ×6 IMPLANT
KIT BASIN OR (CUSTOM PROCEDURE TRAY) ×3 IMPLANT
KIT PORT POWER 8FR ISP CVUE (Catheter) ×2 IMPLANT
NDL HYPO 25X1 1.5 SAFETY (NEEDLE) ×1 IMPLANT
NEEDLE HYPO 25X1 1.5 SAFETY (NEEDLE) ×3 IMPLANT
PACK BASIC VI WITH GOWN DISP (CUSTOM PROCEDURE TRAY) ×3 IMPLANT
SPONGE GAUZE 2X2 STER 10/PKG (GAUZE/BANDAGES/DRESSINGS)
SUT MNCRL AB 4-0 PS2 18 (SUTURE) ×3 IMPLANT
SUT VIC AB 3-0 SH 18 (SUTURE) ×3 IMPLANT
SYR 10ML ECCENTRIC (SYRINGE) ×3 IMPLANT
SYR CONTROL 10ML LL (SYRINGE) ×3 IMPLANT
TOWEL OR 17X26 10 PK STRL BLUE (TOWEL DISPOSABLE) ×3 IMPLANT
TOWEL OR NON WOVEN STRL DISP B (DISPOSABLE) ×3 IMPLANT

## 2017-04-04 NOTE — Anesthesia Preprocedure Evaluation (Signed)
Anesthesia Evaluation  Patient identified by MRN, date of birth, ID band Patient awake    Reviewed: Allergy & Precautions, NPO status , Patient's Chart, lab work & pertinent test results  Airway Mallampati: II  TM Distance: >3 FB Neck ROM: Full    Dental no notable dental hx.    Pulmonary neg pulmonary ROS,    Pulmonary exam normal breath sounds clear to auscultation       Cardiovascular negative cardio ROS Normal cardiovascular exam Rhythm:Regular Rate:Normal     Neuro/Psych negative neurological ROS  negative psych ROS   GI/Hepatic negative GI ROS, Neg liver ROS,   Endo/Other  negative endocrine ROS  Renal/GU negative Renal ROS  negative genitourinary   Musculoskeletal negative musculoskeletal ROS (+)   Abdominal   Peds negative pediatric ROS (+)  Hematology negative hematology ROS (+)   Anesthesia Other Findings   Reproductive/Obstetrics negative OB ROS                             Anesthesia Physical Anesthesia Plan  ASA: II  Anesthesia Plan: General   Post-op Pain Management:    Induction: Intravenous  PONV Risk Score and Plan: 1 and Ondansetron and Dexamethasone  Airway Management Planned: LMA  Additional Equipment:   Intra-op Plan:   Post-operative Plan:   Informed Consent: I have reviewed the patients History and Physical, chart, labs and discussed the procedure including the risks, benefits and alternatives for the proposed anesthesia with the patient or authorized representative who has indicated his/her understanding and acceptance.   Dental advisory given  Plan Discussed with: CRNA and Surgeon  Anesthesia Plan Comments:         Anesthesia Quick Evaluation

## 2017-04-04 NOTE — Progress Notes (Signed)
Pt c/o labial cyst this morning that she states developed overnight.  Pt advised per Dr Lucia Gaskins to go to GYN for assessment/treatment.

## 2017-04-04 NOTE — Telephone Encounter (Signed)
Becca at [facility] recovery called stating pt has a cystic development on her labia similar to in the past. Dr [name] had rx an antibiotic in the past. Chart investigation showed he ordered keflex on 05/26/15. S/w Wilber Bihari NP. Called Becca back and asked her to instruct the pt to go to her PCP, urgent care or her GYN doctor. Dr [name] is out of town and Mendel Ryder is unavailable. Cyst would need to be seen and evaluated before antibiotics could be rx.

## 2017-04-04 NOTE — Transfer of Care (Signed)
Immediate Anesthesia Transfer of Care Note  Patient: Phyllis Pruitt  Procedure(s) Performed: Procedure(s): POWER PORT PLACEMENT (N/A)  Patient Location: PACU  Anesthesia Type:General  Level of Consciousness: awake, alert  and patient cooperative  Airway & Oxygen Therapy: Patient Spontanous Breathing and Patient connected to face mask oxygen  Post-op Assessment: Report given to RN and Post -op Vital signs reviewed and stable  Post vital signs: Reviewed and stable  Last Vitals:  Vitals:   04/04/17 0540  BP: (!) 131/97  Pulse: 89  Resp: 18  Temp: 37 C    Last Pain:  Vitals:   04/04/17 0610  TempSrc:   PainSc: 7       Patients Stated Pain Goal: 4 (36/92/23 0097)  Complications: No apparent anesthesia complications

## 2017-04-04 NOTE — Discharge Instructions (Signed)
General Anesthesia, Adult, Care After These instructions provide you with information about caring for yourself after your procedure. Your health care provider may also give you more specific instructions. Your treatment has been planned according to current medical practices, but problems sometimes occur. Call your health care provider if you have any problems or questions after your procedure. What can I expect after the procedure? After the procedure, it is common to have:  Vomiting.  A sore throat.  Mental slowness.  It is common to feel:  Nauseous.  Cold or shivery.  Sleepy.  Tired.  Sore or achy, even in parts of your body where you did not have surgery.  Follow these instructions at home: For at least 24 hours after the procedure:  Do not: ? Participate in activities where you could fall or become injured. ? Drive. ? Use heavy machinery. ? Drink alcohol. ? Take sleeping pills or medicines that cause drowsiness. ? Make important decisions or sign legal documents. ? Take care of children on your own.  Rest. Eating and drinking  If you vomit, drink water, juice, or soup when you can drink without vomiting.  Drink enough fluid to keep your urine clear or pale yellow.  Make sure you have little or no nausea before eating solid foods.  Follow the diet recommended by your health care provider. General instructions  Have a responsible adult stay with you until you are awake and alert.  Return to your normal activities as told by your health care provider. Ask your health care provider what activities are safe for you.  Take over-the-counter and prescription medicines only as told by your health care provider.  If you smoke, do not smoke without supervision.  Keep all follow-up visits as told by your health care provider. This is important. Contact a health care provider if:  You continue to have nausea or vomiting at home, and medicines are not helpful.  You  cannot drink fluids or start eating again.  You cannot urinate after 8-12 hours.  You develop a skin rash.  You have fever.  You have increasing redness at the site of your procedure. Get help right away if:  You have difficulty breathing.  You have chest pain.  You have unexpected bleeding.  You feel that you are having a life-threatening or urgent problem. This information is not intended to replace advice given to you by your health care provider. Make sure you discuss any questions you have with your health care provider. Document Released: 01/06/2001 Document Revised: 03/04/2016 Document Reviewed: 09/14/2015 Elsevier Interactive Patient Education  2018 [place] TO PATIENT  Activity:  Driving - may drive tomorrow, if doing well   Lifting - No lifting more than 15 pounds for 5 days, then no limit  Wound Care:   Keep wound dry for 2 days, then may shower  Diet:  As tolerated  Follow up appointment:  Call Dr. Pollie Friar office [facility]) at [phone] for an appointment in as needed  Medications and dosages:  Resume your home medications.  Call Dr. Lucia Gaskins or his office  [phone]) if you have:  Temperature greater than 100.4,  Persistent nausea and vomiting,  Severe uncontrolled pain,  Redness, tenderness, or signs of infection (pain, swelling, redness, odor or green/yellow discharge around the site),  Difficulty breathing, headache or visual disturbances,  Any other questions or concerns you may have after discharge.  In an emergency, call 911 or go to an  Emergency Department at a nearby hospital.

## 2017-04-04 NOTE — Interval H&P Note (Signed)
History and Physical Interval Note:  04/04/2017 7:43 AM  Phyllis Pruitt  has presented today for surgery, with the diagnosis of recurrent breast cancer  The various methods of treatment have been discussed with the patient and family. Husband here with patient.    After consideration of risks, benefits and other options for treatment, the patient has consented to  Procedure(s): POWER PORT PLACEMENT (N/A) as a surgical intervention .  The patient's history has been reviewed, patient examined, no change in status, stable for surgery.  I have reviewed the patient's chart and labs.  Questions were answered to the patient's satisfaction.     Lazariah Savard H

## 2017-04-04 NOTE — Anesthesia Postprocedure Evaluation (Signed)
Anesthesia Post Note  Patient: Phyllis Pruitt  Procedure(s) Performed: Procedure(s) (LRB): POWER PORT PLACEMENT (N/A)     Patient location during evaluation: PACU Anesthesia Type: General Level of consciousness: awake and alert Pain management: pain level controlled Vital Signs Assessment: post-procedure vital signs reviewed and stable Respiratory status: spontaneous breathing, nonlabored ventilation, respiratory function stable and patient connected to nasal cannula oxygen Cardiovascular status: blood pressure returned to baseline and stable Postop Assessment: no signs of nausea or vomiting Anesthetic complications: no    Last Vitals:  Vitals:   04/04/17 0900 04/04/17 0915  BP: 133/88 128/82  Pulse: 70 69  Resp: (!) 9 15  Temp:      Last Pain:  Vitals:   04/04/17 0610  TempSrc:   PainSc: 7                  [name]

## 2017-04-04 NOTE — Op Note (Signed)
04/04/2017  8:44 AM  PATIENT:  Phyllis Pruitt, 46 y.o., female MRN: [id number] DOB: [birth year]  PREOP DIAGNOSIS:  recurrent breast cancer, receiving chemotherapy  POSTOP DIAGNOSIS:   Recurrent breast cancer, receiving chemotherapy  PROCEDURE:   Procedure(s): Left subclavian POWER PORT PLACEMENT  SURGEON:   Alphonsa Overall, M.D.  ANESTHESIA:   general  Anesthesiologist: Myrtie Soman, MD CRNA: Lollie Sails, CRNA  General  EBL:  minimal  ml  COUNTS CORRECT:  YES  INDICATIONS FOR PROCEDURE:  [name] is a 46 y.o. (DOB: 1971/09/17) AA female whose primary care physician is [name], MD and comes for power port placement for the treatment of recurrent breast cancer.  She has already had two courses of chemotx and is doing some better with her pain in her right shoulder.  Dr. [name] is her treating oncologist.   The indications and risks of the surgery were explained to the patient.  The risks include, but are not limited to, infection, bleeding, pneumothorax, nerve injury, and thrombosis of the vein.  OPERATIVE NOTE:  The patient was taken to Room #4 at [facility].  Anesthesia was provided by Anesthesiologist: Myrtie Soman, MD CRNA: Lollie Sails, CRNA.  At the beginning of the operation, the patient was given 2 gm Ancef, had a roll placed under her back, and had the upper chest/neck prepped with Chloroprep and draped.   A time out was held and the surgery checklist reviewed.   The patient was placed in Trendelenburg position.  The left subclavian vein was accessed with a 16 gauge needle and a guide wire threaded through the needle into the vein.  The position of the wire was checked with fluoroscopy.   I then developed a pocket in the upper inner aspect of the left chest for the port reservoir.  I used the [employer] for venous access.  The reservoir was sewn in place with a 3-0 Vicryl suture.  The reservoir had been flushed with dilute (10 units/cc)  heparin.   I then passed the silastic tubing from the reservoir incision to the subclavian stick site and used the 8 French introducer to pass it into the vein.  The tip of the silastic catheter was position at the junction of the SVC and the right atrium under fluoroscopy.  The silastic catheter was then attached to the port with the bayonet device.     The entire port and tubing were checked with fluoroscopy and then the port was flushed with 4 cc of concentrated heparin (100 units/cc).   The wounds were then closed with 3-0 vicryl subcutaneous sutures and the skin closed with a 4-0 Monocryl suture.  The skin was painted with DermaBond.   The patient was transferred to the recovery room in good condition.  The sponge and needle count were correct at the end of the case.  A CXR is ordered for port placement and pending at the time of this note.  Alphonsa Overall, MD, [facility] Surgery Pager: [phone] Office phone:  [phone]

## 2017-04-04 NOTE — Anesthesia Procedure Notes (Signed)
Procedure Name: LMA Insertion Date/Time: 04/04/2017 7:56 AM Performed by: Lollie Sails Pre-anesthesia Checklist: Patient identified, Emergency Drugs available, Suction available, Patient being monitored and Timeout performed Patient Re-evaluated:Patient Re-evaluated prior to inductionOxygen Delivery Method: Circle system utilized Preoxygenation: Pre-oxygenation with 100% oxygen Intubation Type: IV induction Ventilation: Mask ventilation without difficulty LMA: LMA inserted LMA Size: 4.0 Number of attempts: 1 Placement Confirmation: positive ETCO2 and breath sounds checked- equal and bilateral Tube secured with: Tape Dental Injury: Teeth and Oropharynx as per pre-operative assessment

## 2017-04-07 ENCOUNTER — Ambulatory Visit: Payer: 59

## 2017-04-08 ENCOUNTER — Other Ambulatory Visit: Payer: 59

## 2017-04-08 ENCOUNTER — Ambulatory Visit: Payer: 59

## 2017-04-09 ENCOUNTER — Ambulatory Visit: Payer: 59

## 2017-04-10 ENCOUNTER — Ambulatory Visit: Payer: 59

## 2017-04-10 ENCOUNTER — Encounter (HOSPITAL_COMMUNITY): Payer: Self-pay | Admitting: Surgery

## 2017-04-15 ENCOUNTER — Ambulatory Visit (HOSPITAL_BASED_OUTPATIENT_CLINIC_OR_DEPARTMENT_OTHER): Payer: 59 | Admitting: Hematology and Oncology

## 2017-04-15 ENCOUNTER — Encounter: Payer: Self-pay | Admitting: Hematology and Oncology

## 2017-04-15 ENCOUNTER — Other Ambulatory Visit (HOSPITAL_BASED_OUTPATIENT_CLINIC_OR_DEPARTMENT_OTHER): Payer: 59

## 2017-04-15 ENCOUNTER — Ambulatory Visit (HOSPITAL_BASED_OUTPATIENT_CLINIC_OR_DEPARTMENT_OTHER): Payer: 59

## 2017-04-15 DIAGNOSIS — Z171 Estrogen receptor negative status [ER-]: Principal | ICD-10-CM

## 2017-04-15 DIAGNOSIS — Z5111 Encounter for antineoplastic chemotherapy: Secondary | ICD-10-CM

## 2017-04-15 DIAGNOSIS — K5900 Constipation, unspecified: Secondary | ICD-10-CM | POA: Diagnosis not present

## 2017-04-15 DIAGNOSIS — R110 Nausea: Secondary | ICD-10-CM

## 2017-04-15 DIAGNOSIS — C642 Malignant neoplasm of left kidney, except renal pelvis: Secondary | ICD-10-CM

## 2017-04-15 DIAGNOSIS — C773 Secondary and unspecified malignant neoplasm of axilla and upper limb lymph nodes: Secondary | ICD-10-CM

## 2017-04-15 DIAGNOSIS — C50411 Malignant neoplasm of upper-outer quadrant of right female breast: Secondary | ICD-10-CM | POA: Diagnosis not present

## 2017-04-15 LAB — COMPREHENSIVE METABOLIC PANEL
ALT: 31 U/L (ref 0–55)
ANION GAP: 8 meq/L (ref 3–11)
AST: 25 U/L (ref 5–34)
Albumin: 3.5 g/dL (ref 3.5–5.0)
Alkaline Phosphatase: 88 U/L (ref 40–150)
BUN: 7.1 mg/dL (ref 7.0–26.0)
CHLORIDE: 108 meq/L (ref 98–109)
CO2: 26 meq/L (ref 22–29)
CREATININE: 0.8 mg/dL (ref 0.6–1.1)
Calcium: 9.3 mg/dL (ref 8.4–10.4)
EGFR: >90 mL/min/{1.73_m2} (ref >90)
Glucose: 94 mg/dl (ref 70–140)
Potassium: 3.5 mEq/L (ref 3.5–5.1)
Sodium: 142 mEq/L (ref 136–145)
Total Bilirubin: 0.27 mg/dL (ref 0.20–1.20)
Total Protein: 7 g/dL (ref 6.4–8.3)

## 2017-04-15 LAB — CBC WITH DIFFERENTIAL/PLATELET
BASO%: 0.8 % (ref 0.0–2.0)
BASOS ABS: 0 10*3/uL (ref 0.0–0.1)
EOS ABS: 0 10*3/uL (ref 0.0–0.5)
EOS%: 0.7 % (ref 0.0–7.0)
HEMATOCRIT: 32 % — AB (ref 34.8–46.6)
HEMOGLOBIN: 10.6 g/dL — AB (ref 11.6–15.9)
LYMPH%: 22.4 % (ref 14.0–49.7)
MCH: 25.7 pg (ref 25.1–34.0)
MCHC: 33.2 g/dL (ref 31.5–36.0)
MCV: 77.3 fL — AB (ref 79.5–101.0)
MONO#: 0.5 10*3/uL (ref 0.1–0.9)
MONO%: 9.1 % (ref 0.0–14.0)
NEUT#: 3.8 10*3/uL (ref 1.5–6.5)
NEUT%: 67 % (ref 38.4–76.8)
PLATELETS: 395 10*3/uL (ref 145–400)
RBC: 4.14 10*6/uL (ref 3.70–5.45)
RDW: 17.6 % — AB (ref 11.2–14.5)
WBC: 5.6 10*3/uL (ref 3.9–10.3)
lymph#: 1.3 10*3/uL (ref 0.9–3.3)

## 2017-04-15 MED ORDER — GEMCITABINE HCL CHEMO INJECTION 1 GM/26.3ML
2000.0000 mg | Freq: Once | INTRAVENOUS | Status: AC
  Administered 2017-04-15: 2000 mg via INTRAVENOUS
  Filled 2017-04-15: qty 52.6

## 2017-04-15 MED ORDER — DEXAMETHASONE SODIUM PHOSPHATE 10 MG/ML IJ SOLN
INTRAMUSCULAR | Status: AC
  Filled 2017-04-15: qty 1

## 2017-04-15 MED ORDER — PALONOSETRON HCL INJECTION 0.25 MG/5ML
INTRAVENOUS | Status: AC
  Filled 2017-04-15: qty 5

## 2017-04-15 MED ORDER — SODIUM CHLORIDE 0.9% FLUSH
10.0000 mL | INTRAVENOUS | Status: DC | PRN
  Administered 2017-04-15: 10 mL
  Filled 2017-04-15: qty 10

## 2017-04-15 MED ORDER — HEPARIN SOD (PORK) LOCK FLUSH 100 UNIT/ML IV SOLN
500.0000 [IU] | Freq: Once | INTRAVENOUS | Status: AC | PRN
  Administered 2017-04-15: 500 [IU]
  Filled 2017-04-15: qty 5

## 2017-04-15 MED ORDER — PALONOSETRON HCL INJECTION 0.25 MG/5ML
0.2500 mg | Freq: Once | INTRAVENOUS | Status: AC
  Administered 2017-04-15: 0.25 mg via INTRAVENOUS

## 2017-04-15 MED ORDER — DEXAMETHASONE SODIUM PHOSPHATE 10 MG/ML IJ SOLN
10.0000 mg | Freq: Once | INTRAMUSCULAR | Status: AC
  Administered 2017-04-15: 10 mg via INTRAVENOUS

## 2017-04-15 MED ORDER — SODIUM CHLORIDE 0.9 % IV SOLN
Freq: Once | INTRAVENOUS | Status: AC
  Administered 2017-04-15: 12:00:00 via INTRAVENOUS

## 2017-04-15 MED ORDER — SODIUM CHLORIDE 0.9 % IV SOLN
290.8000 mg | Freq: Once | INTRAVENOUS | Status: AC
  Administered 2017-04-15: 290 mg via INTRAVENOUS
  Filled 2017-04-15: qty 29

## 2017-04-15 NOTE — Addendum Note (Signed)
Addended by: Docia Chuck on: 04/15/2017 08:48 AM   Modules accepted: Orders

## 2017-04-15 NOTE — Assessment & Plan Note (Signed)
Right breast invasive ductal carcinoma multifocal, multicentric disease with at least 8 tumors, 2.3 cm, 1.6 cm of the biggest tumors in addition 6 more tumors 1 cm or less spanning 13.7 cm, right axillary lymph node biopsy positive (ER 22%); primary tumor is ER 0%, PR 0%, HER-2 negative, Ki-67 77% and 73% respectively, grade 3 T3 N1 M0 equals stage IIIa, Grade 3 1. Rt mastectomy 07/28/15: Multifocal IDC 2 cm, 1.5 cm. 0.6 cm; 3/13 LN positive, T1c N1 M0 stage IIA, ER/PR HER-2 negative. 2. Adjuvant XRT 09/03/15 to 10/13/15 3. Adjuvant Xeloda X 6 months (8 cycles) started 11/15/2015 completed July 2017 ----------------------------------------------------------------------- PET-CT scan 03/03/17: Hypermet rec tumor in right pectoralis musculature, Rt Hilar, Rt Para tracheal and Rt Int Mamm LN mets, Solit Left scapular mets, Sol 8 mm lesion in Rt liver mets, Several sub cm lung nodules.  Rt Axillary LN Biopsy: Triple negative right breast cancer Bronchoscopic Biopsy at [facility]: Poorly differentiated metastatic adenocarcinoma triple negative -------------------------------------------------------------- Current treatment: Carboplatin-gemcitabine days 1 and 8 every 3 weeks started 03/14/2017. Today is cycle 2  Chemotherapy toxicities:  Return to clinic in 3 weeks for cycle 3

## 2017-04-15 NOTE — Progress Notes (Signed)
 Patient Care Team: Griffin, Elaine, MD as PCP - General (Family Medicine) Magrinat, Gustav C, MD as Consulting Physician (Oncology) Wentworth, Stacy, MD (Inactive) as Consulting Physician (Radiation Oncology) Stuart, Dawn C, RN as Registered Nurse Martini, Keisha N, RN as Registered Nurse Newman, David, MD as Consulting Physician (General Surgery) Mackey, Heather Thompson, NP as Nurse Practitioner (Hematology and Oncology)  DIAGNOSIS:  Encounter Diagnosis  Name Primary?  . Malignant neoplasm of upper-outer quadrant of right breast in female, estrogen receptor negative (HCC)     SUMMARY OF ONCOLOGIC HISTORY:   Breast cancer of upper-outer quadrant of right female breast (HCC)   01/19/2015 Mammogram    Right breast: density with indistinct margin at 12:00, posterior depth; also oval mass with obscured margin at 11:00, middle depth; irregular mass with indistinct margin central to nipple, middle depth      01/23/2015 Initial Biopsy    Right breast 9:00 and 10:00 biopsy ([facility]): Grade 3 IDC ER/PR negative, HER-2 negative, Ki-67 77% and 73%; the right axillary lymph node biopsy: ER 22% positive      01/30/2015 Breast MRI    Right breast masses 9 and 10:00 position, additional irregular masses with non-mass enhancement upper outer quadrant with one mass extending into upper inner quadrant, multifocal, multicentric 13.7 x 4.6 x 4.6 cm, malignant right axillary lymph node      02/08/2015 Clinical Stage    Stage IIIA: T3, N1, M0      02/09/2015 Procedure    BreastNext panel revealed VUS BRCA2 gene called [id number] ([id number]>G); otherwise no clinically significant variant at ATM, BARD1, BRCA1, BRCA2, BRIP1, CDH1, CHEK2, MRE11A, MUTYH, NBN, NF1, PALB2, PTEN, RAD50, RAD51C, RAD51D, and TP53      02/15/2015 Imaging    No evidence of metastatic disease, 2.2 cm enhancing lesion anterior left lower kidney suspicious for solid renal neoplasm      02/20/2015 - 07/10/2015 Neo-Adjuvant Chemotherapy   dose dense doxorubicin and cyclophosphamide x 4 followed by Abraxane weekly x 12      07/10/2015 Breast MRI    Multiple irregular enhancing masses right breast are less confluent and smaller dominant mass 10:00 2.1 x 1.9 x 1.3 cm; right breast 11:00 mass measures 1.2 x 1.2 x 0.8 cm, decreased right axillary lymph node thickening      07/28/2015 Definitive Surgery    Rt mastectomy: Multifocal IDC 2 cm, 1.5 cm. 0.6 cm; 3/13 LN positive, ER/PR HER-2      07/28/2015 Pathologic Stage    Stage IIA: T1c N1 M0        09/03/2015 - 10/13/2015 Radiation Therapy    Adjuvant radiation therapy: Right chest wall/50.4 Gray @ 1.8 Gray per fraction x 28 fractions. Right supraclavicular fossa 45 Gy @1.8 Gy per fraction x 25 fractions      11/15/2015 - 05/10/2016 Chemotherapy    Adjuvant capecitabine - completed 8 cycles      11/16/2015 Survivorship    Survivorship visit completed and copy of care plan given to patient      02/23/2016 Procedure    Left mandible biopsy: Scant material, no malignancy      05/21/2016 -  Anti-estrogen oral therapy    Patient did not take tamoxifen because her ER was weakly positive at 22% and she was worried about toxicities      02/21/2017 Imaging    Rt Axilla 8 X 7 X 7 mm mass inv pectoralis musculature, additional mass 9 mm 1.5 cm apart      02/21/2017 Relapse/Recurrence      Right axillary mass biopsy: 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      03/25/2017 -  Chemotherapy    Carboplatin and gemcitabine palliative chemotherapy       Renal cell carcinoma of left kidney ([place])   01/29/2016 Surgery    Left renal mass partial resection: 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 Overall she appears to be tolerating the treatment relatively well. She had constipation after last chemotherapy for which she took MiraLAX. The pain in the right axilla appears to be improving.  REVIEW OF SYSTEMS:   Constitutional: Denies fevers, chills or abnormal weight loss Eyes: Denies blurriness of vision Ears, nose, mouth, throat, and face: Denies mucositis or sore throat Respiratory: Denies cough, dyspnea or wheezes Cardiovascular: Denies palpitation, chest discomfort Gastrointestinal:  Intermittent constipation Skin: Denies abnormal skin rashes Lymphatics: Denies new lymphadenopathy or easy bruising Neurological:Denies numbness, tingling or new weaknesses Behavioral/Psych: Mood is stable, no new changes  Extremities: No lower extremity edema  All other systems were reviewed with the patient and are negative.  I have reviewed the past medical history, past surgical history, social history and family history with the patient and they are unchanged from previous note.  ALLERGIES:  is allergic to sulfa antibiotics.  MEDICATIONS:  Current Outpatient Prescriptions  Medication Sig Dispense Refill  . benazepril (LOTENSIN) 10 MG tablet Take 1 tablet (10 mg total) by mouth daily.    . Black Pepper-Turmeric 3-500 MG CAPS Take 1 capsule by mouth daily.    Marland Kitchen dexamethasone (DECADRON) 4 MG tablet Take 1 tablet (4 mg total) by mouth daily. Start the day after chemotherapy for 2 days. Take with food. (Patient taking differently: Take 4 mg by mouth every 7 (seven) days. Start the day after chemotherapy for 2 days. Take with food.) 30 tablet 1  . dexamethasone (DECADRON) 4 MG tablet Take 4 mg by mouth every 7 (seven) days. wednesday    . Ginger, Zingiber officinalis, (GINGER ROOT PO) Take 1 tablet by mouth daily.    Marland Kitchen HYDROcodone-acetaminophen (NORCO/VICODIN) 5-325 MG tablet Take 1-2 tablets by mouth every 4 (four) hours as needed  for moderate pain. (Patient taking differently: Take 1-2 tablets by mouth at bedtime as needed for moderate pain. ) 90 tablet 0  . lidocaine-prilocaine (EMLA) cream Apply to affected area once 30 g 3  . LORazepam (ATIVAN) 0.5 MG tablet Take 1 tablet (0.5 mg total) by mouth every 6 (six) hours as needed (Nausea or vomiting). 30 tablet 0  . Multiple Vitamins-Minerals (MULTIVITAMIN) tablet Take 1 tablet by mouth daily.    . ondansetron (ZOFRAN) 8 MG tablet Take 1 tablet (8 mg total) by mouth 2 (two) times daily as needed for refractory nausea / vomiting. Start on day 3 after chemotherapy. (Patient not taking: Reported on 03/25/2017) 30 tablet 1  . pregabalin (LYRICA) 25 MG capsule Take 1 capsule (25 mg total) by mouth 2 (two) times daily. 60 capsule 0  . Probiotic Product (PROBIOTIC PO) Take 1 tablet by mouth daily.    . prochlorperazine (COMPAZINE) 10 MG tablet Take 1 tablet (10 mg total) by mouth every 6 (six) hours as needed (Nausea or vomiting). (Patient not taking:  Reported on 03/25/2017) 30 tablet 1   No current facility-administered medications for this visit.     PHYSICAL EXAMINATION: ECOG PERFORMANCE STATUS: 1 - Symptomatic but completely ambulatory  Vitals:   04/15/17 1103  BP: (!) 166/82  Pulse: 97  Resp: 18  Temp: 98.3 F (36.8 C)   Filed Weights   04/15/17 1103  Weight: 187 lb 11.2 oz (85.1 kg)    GENERAL:alert, no distress and comfortable SKIN: skin color, texture, turgor are normal, no rashes or significant lesions EYES: normal, Conjunctiva are pink and non-injected, sclera clear OROPHARYNX:no exudate, no erythema and lips, buccal mucosa, and tongue normal  NECK: supple, thyroid normal size, non-tender, without nodularity LYMPH:  no palpable lymphadenopathy in the cervical, axillary or inguinal LUNGS: clear to auscultation and percussion with normal breathing effort HEART: regular rate & rhythm and no murmurs and no lower extremity edema ABDOMEN:abdomen soft, non-tender  and normal bowel sounds MUSCULOSKELETAL:no cyanosis of digits and no clubbing  NEURO: alert & oriented x 3 with fluent speech, no focal motor/sensory deficits EXTREMITIES: No lower extremity edema  LABORATORY DATA:  I have reviewed the data as listed   Chemistry      Component Value Date/Time   NA 142 04/15/2017 1027   K 3.5 04/15/2017 1027   CL 104 04/23/2016 1551   CO2 26 04/15/2017 1027   BUN 7.1 04/15/2017 1027   CREATININE 0.8 04/15/2017 1027      Component Value Date/Time   CALCIUM 9.3 04/15/2017 1027   ALKPHOS 88 04/15/2017 1027   AST 25 04/15/2017 1027   ALT 31 04/15/2017 1027   BILITOT 0.27 04/15/2017 1027       Lab Results  Component Value Date   WBC 5.6 04/15/2017   HGB 10.6 (L) 04/15/2017   HCT 32.0 (L) 04/15/2017   MCV 77.3 (L) 04/15/2017   PLT 395 04/15/2017   NEUTROABS 3.8 04/15/2017    ASSESSMENT & PLAN:  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 Rt mastectomy 07/28/15: Multifocal IDC 2 cm, 1.5 cm. 0.6 cm; 3/13 LN positive, T1c N1 M0 stage IIA, ER/PR HER-2 negative. 2. Adjuvant XRT 09/03/15 to 10/13/15 3. Adjuvant Xeloda X 6 months (8 cycles) started 11/15/2015 completed July 2017 ----------------------------------------------------------------------- PET-CT scan 03/03/17: Hypermet rec tumor in right pectoralis musculature, Rt Hilar, Rt Para tracheal and Rt Int Mamm LN mets, Solit Left scapular mets, Sol 8 mm lesion in Rt liver mets, Several sub cm lung nodules.  Rt Axillary LN Biopsy: Triple negative right breast cancer Bronchoscopic Biopsy at [facility]: Poorly differentiated  metastatic adenocarcinoma triple negative -------------------------------------------------------------- Current treatment: Carboplatin-gemcitabine days 1 and 8 every 3 weeks started 03/14/2017. Today is cycle 2  Chemotherapy toxicities: 1. Mild nausea 2. Constipation after chemotherapy  Return to clinic in 3 weeks for cycle 3 Plan is to obtain scans after 3 cycles.  I spent 25 minutes talking to the patient of which more than half was spent in counseling and coordination of care.  No orders of the defined types were placed in this encounter.  The patient has a good understanding of the overall plan. she  agrees with it. she will call with any problems that may develop before the next visit here.   Rulon Eisenmenger, MD 04/15/17

## 2017-04-18 ENCOUNTER — Other Ambulatory Visit: Payer: Self-pay | Admitting: Hematology and Oncology

## 2017-04-18 ENCOUNTER — Other Ambulatory Visit: Payer: Self-pay | Admitting: Adult Health

## 2017-04-18 DIAGNOSIS — Z171 Estrogen receptor negative status [ER-]: Principal | ICD-10-CM

## 2017-04-18 DIAGNOSIS — C50411 Malignant neoplasm of upper-outer quadrant of right female breast: Secondary | ICD-10-CM

## 2017-04-18 MED ORDER — PREGABALIN 50 MG PO CAPS: 50.0000 mg | ORAL_CAPSULE | Freq: Two times a day (BID) | ORAL | 0 refills | Status: DC

## 2017-04-22 ENCOUNTER — Ambulatory Visit (HOSPITAL_BASED_OUTPATIENT_CLINIC_OR_DEPARTMENT_OTHER): Payer: 59

## 2017-04-22 ENCOUNTER — Other Ambulatory Visit (HOSPITAL_BASED_OUTPATIENT_CLINIC_OR_DEPARTMENT_OTHER): Payer: 59

## 2017-04-22 ENCOUNTER — Encounter (HOSPITAL_COMMUNITY): Payer: Self-pay

## 2017-04-22 ENCOUNTER — Ambulatory Visit: Payer: 59 | Admitting: Hematology and Oncology

## 2017-04-22 VITALS — BP 149/98 | HR 84 | Temp 98.7°F | Resp 18

## 2017-04-22 DIAGNOSIS — Z5111 Encounter for antineoplastic chemotherapy: Secondary | ICD-10-CM

## 2017-04-22 DIAGNOSIS — C50411 Malignant neoplasm of upper-outer quadrant of right female breast: Secondary | ICD-10-CM | POA: Diagnosis not present

## 2017-04-22 DIAGNOSIS — Z171 Estrogen receptor negative status [ER-]: Principal | ICD-10-CM

## 2017-04-22 LAB — CBC WITH DIFFERENTIAL/PLATELET
BASO%: 1.4 % (ref 0.0–2.0)
BASOS ABS: 0 10*3/uL (ref 0.0–0.1)
EOS ABS: 0 10*3/uL (ref 0.0–0.5)
EOS%: 0.4 % (ref 0.0–7.0)
HCT: 31.6 % — ABNORMAL LOW (ref 34.8–46.6)
HEMOGLOBIN: 10.4 g/dL — AB (ref 11.6–15.9)
LYMPH%: 39.7 % (ref 14.0–49.7)
MCH: 25.6 pg (ref 25.1–34.0)
MCHC: 32.7 g/dL (ref 31.5–36.0)
MCV: 78.2 fL — AB (ref 79.5–101.0)
MONO#: 0.2 10*3/uL (ref 0.1–0.9)
MONO%: 7.9 % (ref 0.0–14.0)
NEUT%: 50.6 % (ref 38.4–76.8)
NEUTROS ABS: 1.4 10*3/uL — AB (ref 1.5–6.5)
PLATELETS: 366 10*3/uL (ref 145–400)
RBC: 4.04 10*6/uL (ref 3.70–5.45)
RDW: 17.5 % — ABNORMAL HIGH (ref 11.2–14.5)
WBC: 2.8 10*3/uL — AB (ref 3.9–10.3)
lymph#: 1.1 10*3/uL (ref 0.9–3.3)

## 2017-04-22 LAB — COMPREHENSIVE METABOLIC PANEL
ALBUMIN: 3.5 g/dL (ref 3.5–5.0)
ALK PHOS: 98 U/L (ref 40–150)
ALT: 32 U/L (ref 0–55)
ANION GAP: 7 meq/L (ref 3–11)
AST: 24 U/L (ref 5–34)
BILIRUBIN TOTAL: 0.25 mg/dL (ref 0.20–1.20)
BUN: 5 mg/dL — ABNORMAL LOW (ref 7.0–26.0)
CO2: 27 mEq/L (ref 22–29)
Calcium: 9.5 mg/dL (ref 8.4–10.4)
Chloride: 106 mEq/L (ref 98–109)
Creatinine: 0.8 mg/dL (ref 0.6–1.1)
Glucose: 95 mg/dl (ref 70–140)
POTASSIUM: 3.5 meq/L (ref 3.5–5.1)
Sodium: 140 mEq/L (ref 136–145)
TOTAL PROTEIN: 7.2 g/dL (ref 6.4–8.3)

## 2017-04-22 MED ORDER — DEXAMETHASONE SODIUM PHOSPHATE 10 MG/ML IJ SOLN
INTRAMUSCULAR | Status: AC
  Filled 2017-04-22: qty 1

## 2017-04-22 MED ORDER — PALONOSETRON HCL INJECTION 0.25 MG/5ML
0.2500 mg | Freq: Once | INTRAVENOUS | Status: AC
  Administered 2017-04-22: 0.25 mg via INTRAVENOUS

## 2017-04-22 MED ORDER — SODIUM CHLORIDE 0.9% FLUSH
10.0000 mL | INTRAVENOUS | Status: DC | PRN
  Administered 2017-04-22: 10 mL
  Filled 2017-04-22: qty 10

## 2017-04-22 MED ORDER — DEXAMETHASONE SODIUM PHOSPHATE 10 MG/ML IJ SOLN
10.0000 mg | Freq: Once | INTRAMUSCULAR | Status: AC
  Administered 2017-04-22: 10 mg via INTRAVENOUS

## 2017-04-22 MED ORDER — GEMCITABINE HCL CHEMO INJECTION 1 GM/26.3ML
2000.0000 mg | Freq: Once | INTRAVENOUS | Status: AC
  Administered 2017-04-22: 2000 mg via INTRAVENOUS
  Filled 2017-04-22: qty 52.6

## 2017-04-22 MED ORDER — PALONOSETRON HCL INJECTION 0.25 MG/5ML
INTRAVENOUS | Status: AC
  Filled 2017-04-22: qty 5

## 2017-04-22 MED ORDER — SODIUM CHLORIDE 0.9 % IV SOLN
Freq: Once | INTRAVENOUS | Status: AC
  Administered 2017-04-22: 11:00:00 via INTRAVENOUS

## 2017-04-22 MED ORDER — SODIUM CHLORIDE 0.9 % IV SOLN
290.8000 mg | Freq: Once | INTRAVENOUS | Status: AC
  Administered 2017-04-22: 290 mg via INTRAVENOUS
  Filled 2017-04-22: qty 29

## 2017-04-22 MED ORDER — HEPARIN SOD (PORK) LOCK FLUSH 100 UNIT/ML IV SOLN
500.0000 [IU] | Freq: Once | INTRAVENOUS | Status: AC | PRN
  Administered 2017-04-22: 500 [IU]
  Filled 2017-04-22: qty 5

## 2017-04-22 NOTE — Progress Notes (Signed)
Okay to continue chemo treatment today with ANC 1.4, per Dr.Gudena.

## 2017-04-22 NOTE — Patient Instructions (Signed)
[facility] Discharge Instructions for Patients Receiving Chemotherapy  Today you received the following chemotherapy agents: Gemzar, Carboplatin    To help prevent nausea and vomiting after your treatment, we encourage you to take your nausea medication as prescribed. If you develop nausea and vomiting that is not controlled by your nausea medication, call the clinic. NO ZOFRAN FOR 3 DAYS.   BELOW ARE SYMPTOMS THAT SHOULD BE REPORTED IMMEDIATELY:  *FEVER GREATER THAN 100.5 F  *CHILLS WITH OR WITHOUT FEVER  NAUSEA AND VOMITING THAT IS NOT CONTROLLED WITH YOUR NAUSEA MEDICATION  *UNUSUAL SHORTNESS OF BREATH  *UNUSUAL BRUISING OR BLEEDING  TENDERNESS IN MOUTH AND THROAT WITH OR WITHOUT PRESENCE OF ULCERS  *URINARY PROBLEMS  *BOWEL PROBLEMS  UNUSUAL RASH Items with * indicate a potential emergency and should be followed up as soon as possible.  Feel free to call the clinic you have any questions or concerns. The clinic phone number is [phone].  Please show the [facility] at check-in to the Emergency Department and triage nurse.

## 2017-04-22 NOTE — Progress Notes (Signed)
Ok treat with [place] today per May RN with Dr [name]

## 2017-04-29 ENCOUNTER — Telehealth: Payer: Self-pay | Admitting: Medical Oncology

## 2017-04-29 NOTE — Telephone Encounter (Signed)
[facility] LVMOM informing patient of study Cardiac MRI resulting normal and with no alert findings. Dr. [name] reviewed report and then was sent to HIM for scanning to patient's med rec. Patient thanked and encouraged to call Dr. [name] or myself with any questions or concerns. Adele Dan, RN, BSN Clinical Research 04/29/2017 1:34 PM

## 2017-05-06 ENCOUNTER — Ambulatory Visit (HOSPITAL_BASED_OUTPATIENT_CLINIC_OR_DEPARTMENT_OTHER): Payer: 59

## 2017-05-06 ENCOUNTER — Encounter: Payer: Self-pay | Admitting: Hematology and Oncology

## 2017-05-06 ENCOUNTER — Ambulatory Visit (HOSPITAL_BASED_OUTPATIENT_CLINIC_OR_DEPARTMENT_OTHER): Payer: 59 | Admitting: Hematology and Oncology

## 2017-05-06 ENCOUNTER — Other Ambulatory Visit (HOSPITAL_BASED_OUTPATIENT_CLINIC_OR_DEPARTMENT_OTHER): Payer: 59

## 2017-05-06 VITALS — BP 164/93 | HR 82 | Temp 98.2°F | Resp 18 | Ht 65.0 in | Wt 182.9 lb

## 2017-05-06 VITALS — BP 138/72

## 2017-05-06 DIAGNOSIS — Z5111 Encounter for antineoplastic chemotherapy: Secondary | ICD-10-CM | POA: Diagnosis not present

## 2017-05-06 DIAGNOSIS — Z171 Estrogen receptor negative status [ER-]: Principal | ICD-10-CM

## 2017-05-06 DIAGNOSIS — C50411 Malignant neoplasm of upper-outer quadrant of right female breast: Secondary | ICD-10-CM

## 2017-05-06 DIAGNOSIS — C642 Malignant neoplasm of left kidney, except renal pelvis: Secondary | ICD-10-CM

## 2017-05-06 LAB — CBC WITH DIFFERENTIAL/PLATELET
BASO%: 0.6 % (ref 0.0–2.0)
BASOS ABS: 0 10*3/uL (ref 0.0–0.1)
EOS ABS: 0 10*3/uL (ref 0.0–0.5)
EOS%: 0.5 % (ref 0.0–7.0)
HCT: 32.7 % — ABNORMAL LOW (ref 34.8–46.6)
HEMOGLOBIN: 10.8 g/dL — AB (ref 11.6–15.9)
LYMPH#: 1 10*3/uL (ref 0.9–3.3)
LYMPH%: 19.9 % (ref 14.0–49.7)
MCH: 26.1 pg (ref 25.1–34.0)
MCHC: 33 g/dL (ref 31.5–36.0)
MCV: 79 fL — AB (ref 79.5–101.0)
MONO#: 0.5 10*3/uL (ref 0.1–0.9)
MONO%: 8.9 % (ref 0.0–14.0)
NEUT#: 3.7 10*3/uL (ref 1.5–6.5)
NEUT%: 70.1 % (ref 38.4–76.8)
PLATELETS: 255 10*3/uL (ref 145–400)
RBC: 4.13 10*6/uL (ref 3.70–5.45)
RDW: 19.3 % — AB (ref 11.2–14.5)
WBC: 5.2 10*3/uL (ref 3.9–10.3)

## 2017-05-06 LAB — COMPREHENSIVE METABOLIC PANEL
ALT: 35 U/L (ref 0–55)
ANION GAP: 10 meq/L (ref 3–11)
AST: 31 U/L (ref 5–34)
Albumin: 3.7 g/dL (ref 3.5–5.0)
Alkaline Phosphatase: 94 U/L (ref 40–150)
BUN: 7.2 mg/dL (ref 7.0–26.0)
CHLORIDE: 107 meq/L (ref 98–109)
CO2: 25 meq/L (ref 22–29)
CREATININE: 0.8 mg/dL (ref 0.6–1.1)
Calcium: 9.5 mg/dL (ref 8.4–10.4)
EGFR: >90 mL/min/{1.73_m2} (ref >90)
GLUCOSE: 94 mg/dL (ref 70–140)
Potassium: 3.3 mEq/L — ABNORMAL LOW (ref 3.5–5.1)
SODIUM: 142 meq/L (ref 136–145)
Total Bilirubin: 0.44 mg/dL (ref 0.20–1.20)
Total Protein: 7.4 g/dL (ref 6.4–8.3)

## 2017-05-06 MED ORDER — PALONOSETRON HCL INJECTION 0.25 MG/5ML
INTRAVENOUS | Status: AC
  Filled 2017-05-06: qty 5

## 2017-05-06 MED ORDER — HEPARIN SOD (PORK) LOCK FLUSH 100 UNIT/ML IV SOLN
500.0000 [IU] | Freq: Once | INTRAVENOUS | Status: AC | PRN
  Administered 2017-05-06: 500 [IU]
  Filled 2017-05-06: qty 5

## 2017-05-06 MED ORDER — GEMCITABINE HCL CHEMO INJECTION 1 GM/26.3ML
2000.0000 mg | Freq: Once | INTRAVENOUS | Status: AC
  Administered 2017-05-06: 2000 mg via INTRAVENOUS
  Filled 2017-05-06: qty 52.6

## 2017-05-06 MED ORDER — CARBOPLATIN CHEMO INJECTION 450 MG/45ML
290.0000 mg | Freq: Once | INTRAVENOUS | Status: AC
  Administered 2017-05-06: 290 mg via INTRAVENOUS
  Filled 2017-05-06: qty 29

## 2017-05-06 MED ORDER — DEXAMETHASONE SODIUM PHOSPHATE 10 MG/ML IJ SOLN
INTRAMUSCULAR | Status: AC
  Filled 2017-05-06: qty 1

## 2017-05-06 MED ORDER — PALONOSETRON HCL INJECTION 0.25 MG/5ML
0.2500 mg | Freq: Once | INTRAVENOUS | Status: AC
  Administered 2017-05-06: 0.25 mg via INTRAVENOUS

## 2017-05-06 MED ORDER — SODIUM CHLORIDE 0.9 % IV SOLN
Freq: Once | INTRAVENOUS | Status: AC
  Administered 2017-05-06: 11:00:00 via INTRAVENOUS

## 2017-05-06 MED ORDER — DEXAMETHASONE SODIUM PHOSPHATE 10 MG/ML IJ SOLN
10.0000 mg | Freq: Once | INTRAMUSCULAR | Status: AC
  Administered 2017-05-06: 10 mg via INTRAVENOUS

## 2017-05-06 MED ORDER — SODIUM CHLORIDE 0.9% FLUSH
10.0000 mL | INTRAVENOUS | Status: DC | PRN
  Administered 2017-05-06: 10 mL
  Filled 2017-05-06: qty 10

## 2017-05-06 NOTE — Patient Instructions (Signed)
[facility] Discharge Instructions for Patients Receiving Chemotherapy  Today you received the following chemotherapy agents: Gemzar and Carboplatin  To help prevent nausea and vomiting after your treatment, we encourage you to take your nausea medication as directed.    If you develop nausea and vomiting that is not controlled by your nausea medication, call the clinic.   BELOW ARE SYMPTOMS THAT SHOULD BE REPORTED IMMEDIATELY:  *FEVER GREATER THAN 100.5 F  *CHILLS WITH OR WITHOUT FEVER  NAUSEA AND VOMITING THAT IS NOT CONTROLLED WITH YOUR NAUSEA MEDICATION  *UNUSUAL SHORTNESS OF BREATH  *UNUSUAL BRUISING OR BLEEDING  TENDERNESS IN MOUTH AND THROAT WITH OR WITHOUT PRESENCE OF ULCERS  *URINARY PROBLEMS  *BOWEL PROBLEMS  UNUSUAL RASH Items with * indicate a potential emergency and should be followed up as soon as possible.  Feel free to call the clinic you have any questions or concerns. The clinic phone number is [phone].  Please show the [facility] at check-in to the Emergency Department and triage nurse.     Hypokalemia Hypokalemia means that the amount of potassium in the blood is lower than normal.Potassium is a chemical that helps regulate the amount of fluid in the body (electrolyte). It also stimulates muscle tightening (contraction) and helps nerves work properly.Normally, most of the body's potassium is inside of cells, and only a very small amount is in the blood. Because the amount in the blood is so small, minor changes to potassium levels in the blood can be life-threatening. What are the causes? This condition may be caused by:  Antibiotic medicine.  Diarrhea or vomiting. Taking too much of a medicine that helps you have a bowel movement (laxative) can cause diarrhea and lead to hypokalemia.  Chronic kidney disease (CKD).  Medicines that help the body get rid of excess fluid (diuretics).  Eating disorders, such as  bulimia.  Low magnesium levels in the body.  Sweating a lot.  What are the signs or symptoms? Symptoms of this condition include:  Weakness.  Constipation.  Fatigue.  Muscle cramps.  Mental confusion.  Skipped heartbeats or irregular heartbeat (palpitations).  Tingling or numbness.  How is this diagnosed? This condition is diagnosed with a blood test. How is this treated? Hypokalemia can be treated by taking potassium supplements by mouth or adjusting the medicines that you take. Treatment may also include eating more foods that contain a lot of potassium. If your potassium level is very low, you may need to get potassium through an IV tube in one of your veins and be monitored in the hospital. Follow these instructions at home:  Take over-the-counter and prescription medicines only as told by your health care provider. This includes vitamins and supplements.  Eat a healthy diet. A healthy diet includes fresh fruits and vegetables, whole grains, healthy fats, and lean proteins.  If instructed, eat more foods that contain a lot of potassium, such as: ? Nuts, such as peanuts and pistachios. ? Seeds, such as sunflower seeds and pumpkin seeds. ? Peas, lentils, and lima beans. ? Whole grain and bran cereals and breads. ? Fresh fruits and vegetables, such as apricots, avocado, bananas, cantaloupe, kiwi, oranges, tomatoes, asparagus, and potatoes. ? Orange juice. ? Tomato juice. ? Red meats. ? Yogurt.  Keep all follow-up visits as told by your health care provider. This is important. Contact a health care provider if:  You have weakness that gets worse.  You feel your heart pounding or racing.  You vomit.  You have diarrhea.  You have diabetes (diabetes mellitus) and you have trouble keeping your blood sugar (glucose) in your target range. Get help right away if:  You have chest pain.  You have shortness of breath.  You have vomiting or diarrhea that lasts for  more than 2 days.  You faint. This information is not intended to replace advice given to you by your health care provider. Make sure you discuss any questions you have with your health care provider. Document Released: 09/30/2005 Document Revised: 05/18/2016 Document Reviewed: 05/18/2016 Elsevier Interactive Patient Education  2018 [employer].

## 2017-05-06 NOTE — Progress Notes (Signed)
Patient Care Team: [name], MD as PCP - General (Family Medicine) Magrinat, Virgie Dad, MD as Consulting Physician (Oncology) Thea Silversmith, MD (Inactive) as Consulting Physician (Radiation Oncology) Mauro Kaufmann, RN as Registered Nurse Rockwell Germany, RN as Registered Nurse Alphonsa Overall, MD as Consulting Physician (General Surgery) Sylvan Cheese, NP as Nurse Practitioner (Hematology and Oncology)  DIAGNOSIS:  Encounter Diagnoses  Name Primary?  . Malignant neoplasm of upper-outer quadrant of right breast in female, estrogen receptor negative ([place])   . Renal cell carcinoma of left kidney ([facility]) Yes    SUMMARY OF ONCOLOGIC HISTORY:   Breast cancer of upper-outer quadrant of right female breast ([place])   01/19/2015 Mammogram    Right breast: density with indistinct margin at 12:00, posterior depth; also oval mass with obscured margin at 11:00, middle depth; irregular mass with indistinct margin central to nipple, middle depth      01/23/2015 Initial Biopsy    Right breast 9:00 and 10:00 biopsy ([facility]): Grade 3 IDC ER/PR negative, HER-2 negative, Ki-67 77% and 73%; the right axillary lymph node biopsy: ER 22% positive      01/30/2015 Breast MRI    Right breast masses 9 and 10:00 position, additional irregular masses with non-mass enhancement upper outer quadrant with one mass extending into upper inner quadrant, multifocal, multicentric 13.7 x 4.6 x 4.6 cm, malignant right axillary lymph node      02/08/2015 Clinical Stage    Stage IIIA: T3, N1, M0      02/09/2015 Procedure    BreastNext panel revealed VUS BRCA2 gene called [id number] (c.[id number]>G); otherwise no clinically significant variant at ATM, BARD1, BRCA1, BRCA2, BRIP1, CDH1, CHEK2, MRE11A, MUTYH, NBN, NF1, PALB2, PTEN, RAD50, RAD51C, RAD51D, and TP53      02/15/2015 Imaging    No evidence of metastatic disease, 2.2 cm enhancing lesion anterior left lower kidney suspicious for solid renal neoplasm      02/20/2015 - 07/10/2015 Neo-Adjuvant Chemotherapy    dose dense doxorubicin and cyclophosphamide x 4 followed by Abraxane weekly x 12      07/10/2015 Breast MRI    Multiple irregular enhancing masses right breast are less confluent and smaller dominant mass 10:00 2.1 x 1.9 x 1.3 cm; right breast 11:00 mass measures 1.2 x 1.2 x 0.8 cm, decreased right axillary lymph node thickening      07/28/2015 Definitive Surgery    Rt mastectomy: Multifocal IDC 2 cm, 1.5 cm. 0.6 cm; 3/13 LN positive, ER/PR HER-2      07/28/2015 Pathologic Stage    Stage IIA: T1c N1 M0        09/03/2015 - 10/13/2015 Radiation Therapy    Adjuvant radiation therapy: Right chest wall/50.4 [place] Cables @ 1.8 Gray per fraction x 28 fractions. Right supraclavicular fossa 45 Gy _0 .8 Gy per fraction x 25 fractions      11/15/2015 - 05/10/2016 Chemotherapy    Adjuvant capecitabine - completed 8 cycles      11/16/2015 Survivorship    Survivorship visit completed and copy of care plan given to patient      02/23/2016 Procedure    Left mandible biopsy: Scant material, no malignancy      05/21/2016 -  Anti-estrogen oral therapy    Patient did not take tamoxifen because her ER was weakly positive at 22% and she was worried about toxicities      02/21/2017 Imaging    Rt Axilla 8 X 7 X 7 mm mass inv pectoralis musculature, additional mass 9 mm 1.5  cm apart      02/21/2017 Relapse/Recurrence    Right axillary mass biopsy: 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      03/25/2017 -  Chemotherapy    Carboplatin and gemcitabine palliative chemotherapy       Renal cell carcinoma of left kidney ([place])   01/29/2016 Surgery    Left renal mass partial resection: 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 Today is cycle 3. Overall she is tolerated chemotherapy reasonably well. She does have fatigue. Denies any nausea vomiting. The pain in the right shoulder has improved markedly. She does not take much pain medication and takes Lyrica twice a day. Patient lost almost 35 pounds by watching what she eats.  REVIEW OF SYSTEMS:   Constitutional: Denies fevers, chills or abnormal weight loss Eyes: Denies blurriness of vision Ears, nose, mouth, throat, and face: Denies mucositis or sore throat Respiratory: Denies cough, dyspnea or wheezes Cardiovascular: Denies palpitation, chest discomfort Gastrointestinal:  Denies nausea, heartburn or change in bowel habits Skin: Denies abnormal skin rashes Lymphatics: Denies new lymphadenopathy or easy bruising Neurological:Denies numbness, tingling or new weaknesses Behavioral/Psych: Mood is stable, no new changes  Extremities: No lower extremity edema, right shoulder pain has improved significantly  All other systems were reviewed with the patient and are negative.  I have reviewed the past medical history, past surgical history, social history and family history with the patient and they are unchanged from previous note.  ALLERGIES:  is allergic to sulfa antibiotics.  MEDICATIONS:  Current Outpatient Prescriptions  Medication Sig Dispense Refill  . benazepril (LOTENSIN) 10 MG tablet Take 1 tablet (10 mg total) by mouth daily.    . Black Pepper-Turmeric 3-500 MG CAPS Take 1 capsule by mouth daily.    Marland Kitchen dexamethasone (DECADRON) 4 MG tablet Take 1 tablet (4 mg total) by mouth daily. Start the day after chemotherapy for 2 days. Take with food. (Patient taking differently: Take 4 mg by mouth every 7 (seven) days. Start the day after chemotherapy for 2 days. Take with food.) 30 tablet 1  . dexamethasone (DECADRON) 4 MG tablet Take 4 mg by mouth every 7 (seven) days. wednesday    . Ginger, Zingiber  officinalis, (GINGER ROOT PO) Take 1 tablet by mouth daily.    Marland Kitchen HYDROcodone-acetaminophen (NORCO/VICODIN) 5-325 MG tablet Take 1-2 tablets by mouth every 4 (four) hours as needed for moderate pain. (Patient taking differently: Take 1-2 tablets by mouth at bedtime as needed for moderate pain. ) 90 tablet 0  . lidocaine-prilocaine (EMLA) cream Apply to affected area once 30 g 3  . LORazepam (ATIVAN) 0.5 MG tablet Take 1 tablet (0.5 mg total) by mouth every 6 (six) hours as needed (Nausea or vomiting). 30 tablet 0  . Multiple Vitamins-Minerals (MULTIVITAMIN) tablet Take 1 tablet by mouth daily.    . ondansetron (ZOFRAN) 8 MG tablet Take 1 tablet (8 mg total) by mouth 2 (two) times daily as needed for refractory nausea / vomiting. Start on day 3 after chemotherapy. (Patient not taking: Reported on 03/25/2017) 30 tablet 1  . pregabalin (LYRICA) 50 MG capsule Take 1 capsule (50 mg total) by mouth 2 (two) times daily. 60 capsule 0  . Probiotic Product (  PROBIOTIC PO) Take 1 tablet by mouth daily.    . prochlorperazine (COMPAZINE) 10 MG tablet Take 1 tablet (10 mg total) by mouth every 6 (six) hours as needed (Nausea or vomiting). (Patient not taking: Reported on 03/25/2017) 30 tablet 1   No current facility-administered medications for this visit.     PHYSICAL EXAMINATION: ECOG PERFORMANCE STATUS: 1 - Symptomatic but completely ambulatory  Vitals:   05/06/17 1024  BP: (!) 164/93  Pulse: 82  Resp: 18  Temp: 98.2 F (36.8 C)   Filed Weights   05/06/17 1024  Weight: 182 lb 14.4 oz (83 kg)    GENERAL:alert, no distress and comfortable SKIN: skin color, texture, turgor are normal, no rashes or significant lesions EYES: normal, Conjunctiva are pink and non-injected, sclera clear OROPHARYNX:no exudate, no erythema and lips, buccal mucosa, and tongue normal  NECK: supple, thyroid normal size, non-tender, without nodularity LYMPH:  no palpable lymphadenopathy in the cervical, axillary or  inguinal LUNGS: clear to auscultation and percussion with normal breathing effort HEART: regular rate & rhythm and no murmurs and no lower extremity edema ABDOMEN:abdomen soft, non-tender and normal bowel sounds MUSCULOSKELETAL:no cyanosis of digits and no clubbing  NEURO: alert & oriented x 3 with fluent speech, no focal motor/sensory deficits EXTREMITIES: No lower extremity edema  LABORATORY DATA:  I have reviewed the data as listed   Chemistry      Component Value Date/Time   NA 140 04/22/2017 0945   K 3.5 04/22/2017 0945   CL 104 04/23/2016 1551   CO2 27 04/22/2017 0945   BUN 5.0 (L) 04/22/2017 0945   CREATININE 0.8 04/22/2017 0945      Component Value Date/Time   CALCIUM 9.5 04/22/2017 0945   ALKPHOS 98 04/22/2017 0945   AST 24 04/22/2017 0945   ALT 32 04/22/2017 0945   BILITOT 0.25 04/22/2017 0945       Lab Results  Component Value Date   WBC 5.2 05/06/2017   HGB 10.8 (L) 05/06/2017   HCT 32.7 (L) 05/06/2017   MCV 79.0 (L) 05/06/2017   PLT 255 05/06/2017   NEUTROABS 3.7 05/06/2017    ASSESSMENT & PLAN:  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 Rt mastectomy 07/28/15: Multifocal IDC 2 cm, 1.5 cm. 0.6 cm; 3/13 LN positive, T1c N1 M0 stage IIA, ER/PR HER-2 negative. 2. Adjuvant XRT 09/03/15 to 10/13/15 3. Adjuvant Xeloda X 6 months (8 cycles) started 11/15/2015 completed July 2017 ----------------------------------------------------------------------- PET-CT scan 03/03/17: Hypermet rec tumor in right pectoralis musculature, Rt Hilar, Rt Para tracheal and Rt Int Mamm LN mets, Solit Left  scapular mets, Sol 8 mm lesion in Rt liver mets, Several sub cm lung nodules.  Rt Axillary LN Biopsy: Triple negative right breast cancer [facility]opsy at [facility]: Poorly differentiated metastatic adenocarcinoma triple negative -------------------------------------------------------------- Current treatment: Carboplatin-gemcitabine days 1 and 8 every 3 weeks started 03/14/2017. Today is cycle 3  Chemotherapy toxicities: 1. Mild nausea 2. Constipation after chemotherapy  Return to clinic in 3 weeks for cycle 4 Plan is to obtain scans in 3 weeks and follow-up.   I spent 25 minutes  talking to the patient of which more than half was spent in counseling and coordination of care.  Orders Placed This Encounter  Procedures  . CT Abdomen Pelvis W Contrast    Standing Status:   Future    Standing Expiration Date:   05/06/2018    Order Specific Question:   If indicated for the ordered procedure, I authorize the administration of contrast media per Radiology protocol    Answer:   Yes    Order Specific Question:   Reason for Exam (SYMPTOM  OR DIAGNOSIS REQUIRED)    Answer:   Met breast cancer restaging on chemo    Order Specific Question:   Is patient pregnant?    Answer:   No    Order Specific Question:   Preferred imaging location?    Answer:   [facility]    Order Specific Question:   Radiology Contrast Protocol - do NOT remove file path    Answer:   \\[email or web address].pdf  . CT Chest W Contrast    Standing Status:   Future    Standing Expiration Date:   05/06/2018    Order Specific Question:   If indicated for the ordered procedure, I authorize the administration of contrast media per Radiology protocol    Answer:   Yes    Order Specific Question:   Reason for Exam (SYMPTOM  OR DIAGNOSIS REQUIRED)    Answer:   Met breast cancer restaging on chemo    Order Specific Question:   Is patient pregnant?    Answer:   No    Order Specific Question:    Preferred imaging location?    Answer:   [facility]    Order Specific Question:   Radiology Contrast Protocol - do NOT remove file path    Answer:   \\[email or web address].pdf   The patient has a good understanding of the overall plan. she agrees with it. she will call with any problems that may develop before the next visit here.   Rulon Eisenmenger, MD 05/06/17

## 2017-05-06 NOTE — Progress Notes (Signed)
Printed instructions on hypokalemia given to pt. Pt verbalizes understanding. Pt stable at discharge.

## 2017-05-06 NOTE — Assessment & Plan Note (Signed)
Right breast invasive ductal carcinoma multifocal, multicentric disease with at least 8 tumors, 2.3 cm, 1.6 cm of the biggest tumors in addition 6 more tumors 1 cm or less spanning 13.7 cm, right axillary lymph node biopsy positive (ER 22%); primary tumor is ER 0%, PR 0%, HER-2 negative, Ki-67 77% and 73% respectively, grade 3 T3 N1 M0 equals stage IIIa, Grade 3 1. Rt mastectomy 07/28/15: Multifocal IDC 2 cm, 1.5 cm. 0.6 cm; 3/13 LN positive, T1c N1 M0 stage IIA, ER/PR HER-2 negative. 2. Adjuvant XRT 09/03/15 to 10/13/15 3. Adjuvant Xeloda X 6 months (8 cycles) started 11/15/2015 completed July 2017 ----------------------------------------------------------------------- PET-CT scan 03/03/17: Hypermet rec tumor in right pectoralis musculature, Rt Hilar, Rt Para tracheal and Rt Int Mamm LN mets, Solit Left scapular mets, Sol 8 mm lesion in Rt liver mets, Several sub cm lung nodules.  Rt Axillary LN Biopsy: Triple negative right breast cancer Bronchoscopic Biopsy at [facility]: Poorly differentiated metastatic adenocarcinoma triple negative -------------------------------------------------------------- Current treatment: Carboplatin-gemcitabine days 1 and 8 every 3 weeks started 03/14/2017. Today is cycle 3  Chemotherapy toxicities: 1. Mild nausea 2. Constipation after chemotherapy  Return to clinic in 3 weeks for cycle 4 Plan is to obtain scans in 3 weeks and follow-up.

## 2017-05-13 ENCOUNTER — Ambulatory Visit (HOSPITAL_BASED_OUTPATIENT_CLINIC_OR_DEPARTMENT_OTHER): Payer: 59

## 2017-05-13 ENCOUNTER — Other Ambulatory Visit: Payer: Self-pay | Admitting: *Deleted

## 2017-05-13 ENCOUNTER — Telehealth: Payer: Self-pay | Admitting: *Deleted

## 2017-05-13 VITALS — BP 143/92 | HR 82 | Temp 98.7°F | Resp 20 | Ht 65.0 in

## 2017-05-13 DIAGNOSIS — Z5111 Encounter for antineoplastic chemotherapy: Secondary | ICD-10-CM

## 2017-05-13 DIAGNOSIS — C50411 Malignant neoplasm of upper-outer quadrant of right female breast: Secondary | ICD-10-CM

## 2017-05-13 DIAGNOSIS — Z171 Estrogen receptor negative status [ER-]: Principal | ICD-10-CM

## 2017-05-13 LAB — CBC WITH DIFFERENTIAL/PLATELET
BASO%: 1.4 % (ref 0.0–2.0)
Basophils Absolute: 0 10*3/uL (ref 0.0–0.1)
EOS%: 0.7 % (ref 0.0–7.0)
Eosinophils Absolute: 0 10*3/uL (ref 0.0–0.5)
HCT: 31.9 % — ABNORMAL LOW (ref 34.8–46.6)
HEMOGLOBIN: 10.4 g/dL — AB (ref 11.6–15.9)
LYMPH%: 41.4 % (ref 14.0–49.7)
MCH: 26.4 pg (ref 25.1–34.0)
MCHC: 32.6 g/dL (ref 31.5–36.0)
MCV: 81 fL (ref 79.5–101.0)
MONO#: 0.2 10*3/uL (ref 0.1–0.9)
MONO%: 7.3 % (ref 0.0–14.0)
NEUT%: 49.2 % (ref 38.4–76.8)
NEUTROS ABS: 1.2 10*3/uL — AB (ref 1.5–6.5)
Platelets: 272 10*3/uL (ref 145–400)
RBC: 3.94 10*6/uL (ref 3.70–5.45)
RDW: 20.9 % — AB (ref 11.2–14.5)
WBC: 2.4 10*3/uL — AB (ref 3.9–10.3)
lymph#: 1 10*3/uL (ref 0.9–3.3)

## 2017-05-13 LAB — COMPREHENSIVE METABOLIC PANEL
ALBUMIN: 3.6 g/dL (ref 3.5–5.0)
ALK PHOS: 89 U/L (ref 40–150)
ALT: 39 U/L (ref 0–55)
AST: 26 U/L (ref 5–34)
Anion Gap: 9 mEq/L (ref 3–11)
BILIRUBIN TOTAL: 0.25 mg/dL (ref 0.20–1.20)
BUN: 5.7 mg/dL — AB (ref 7.0–26.0)
CO2: 26 meq/L (ref 22–29)
Calcium: 9.5 mg/dL (ref 8.4–10.4)
Chloride: 107 mEq/L (ref 98–109)
Creatinine: 0.8 mg/dL (ref 0.6–1.1)
EGFR: >90 mL/min/{1.73_m2} (ref >90)
GLUCOSE: 113 mg/dL (ref 70–140)
POTASSIUM: 3.4 meq/L — AB (ref 3.5–5.1)
SODIUM: 142 meq/L (ref 136–145)
TOTAL PROTEIN: 7.3 g/dL (ref 6.4–8.3)

## 2017-05-13 MED ORDER — DEXAMETHASONE SODIUM PHOSPHATE 10 MG/ML IJ SOLN
INTRAMUSCULAR | Status: AC
  Filled 2017-05-13: qty 1

## 2017-05-13 MED ORDER — SODIUM CHLORIDE 0.9% FLUSH
10.0000 mL | INTRAVENOUS | Status: DC | PRN
  Administered 2017-05-13: 10 mL
  Filled 2017-05-13: qty 10

## 2017-05-13 MED ORDER — DEXAMETHASONE SODIUM PHOSPHATE 10 MG/ML IJ SOLN
10.0000 mg | Freq: Once | INTRAMUSCULAR | Status: AC
  Administered 2017-05-13: 10 mg via INTRAVENOUS

## 2017-05-13 MED ORDER — CARBOPLATIN CHEMO INJECTION 450 MG/45ML
290.8000 mg | Freq: Once | INTRAVENOUS | Status: AC
  Administered 2017-05-13: 290 mg via INTRAVENOUS
  Filled 2017-05-13: qty 29

## 2017-05-13 MED ORDER — PALONOSETRON HCL INJECTION 0.25 MG/5ML
INTRAVENOUS | Status: AC
  Filled 2017-05-13: qty 5

## 2017-05-13 MED ORDER — GEMCITABINE HCL CHEMO INJECTION 1 GM/26.3ML
2000.0000 mg | Freq: Once | INTRAVENOUS | Status: AC
  Administered 2017-05-13: 2000 mg via INTRAVENOUS
  Filled 2017-05-13: qty 52.6

## 2017-05-13 MED ORDER — HEPARIN SOD (PORK) LOCK FLUSH 100 UNIT/ML IV SOLN
500.0000 [IU] | Freq: Once | INTRAVENOUS | Status: AC | PRN
Start: 2017-05-13 — End: 2017-05-13
  Administered 2017-05-13: 500 [IU]
  Filled 2017-05-13: qty 5

## 2017-05-13 MED ORDER — SODIUM CHLORIDE 0.9 % IV SOLN
Freq: Once | INTRAVENOUS | Status: AC
  Administered 2017-05-13: 11:00:00 via INTRAVENOUS

## 2017-05-13 MED ORDER — PALONOSETRON HCL INJECTION 0.25 MG/5ML
0.2500 mg | Freq: Once | INTRAVENOUS | Status: AC
  Administered 2017-05-13: 0.25 mg via INTRAVENOUS

## 2017-05-13 NOTE — Telephone Encounter (Signed)
Ok to treat despite ANC 1.2

## 2017-05-13 NOTE — Progress Notes (Signed)
OK to treat with ANC  1.2  Today as per Dr. [name].

## 2017-05-13 NOTE — Patient Instructions (Signed)
[facility] Discharge Instructions for Patients Receiving Chemotherapy  Today you received the following chemotherapy agents :  Gemcitabine, Carboplatin.  To help prevent nausea and vomiting after your treatment, we encourage you to take your nausea medication as prescribed.   If you develop nausea and vomiting that is not controlled by your nausea medication, call the clinic.   BELOW ARE SYMPTOMS THAT SHOULD BE REPORTED IMMEDIATELY:  *FEVER GREATER THAN 100.5 F  *CHILLS WITH OR WITHOUT FEVER  NAUSEA AND VOMITING THAT IS NOT CONTROLLED WITH YOUR NAUSEA MEDICATION  *UNUSUAL SHORTNESS OF BREATH  *UNUSUAL BRUISING OR BLEEDING  TENDERNESS IN MOUTH AND THROAT WITH OR WITHOUT PRESENCE OF ULCERS  *URINARY PROBLEMS  *BOWEL PROBLEMS  UNUSUAL RASH Items with * indicate a potential emergency and should be followed up as soon as possible.  Feel free to call the clinic you have any questions or concerns. The clinic phone number is [phone].  Please show the CHEMO ALERT CARD at check-in to the Emergency Department and triage nurse.   

## 2017-05-26 ENCOUNTER — Encounter (HOSPITAL_COMMUNITY): Payer: Self-pay | Admitting: Radiology

## 2017-05-26 ENCOUNTER — Ambulatory Visit (HOSPITAL_COMMUNITY)
Admission: RE | Admit: 2017-05-26 | Discharge: 2017-05-26 | Disposition: A | Discharge status: Home / Self Care | Payer: 59 | Source: Ambulatory Visit | Attending: Hematology and Oncology | Admitting: Hematology and Oncology

## 2017-05-26 DIAGNOSIS — C787 Secondary malignant neoplasm of liver and intrahepatic bile duct: Secondary | ICD-10-CM | POA: Diagnosis not present

## 2017-05-26 DIAGNOSIS — C7951 Secondary malignant neoplasm of bone: Secondary | ICD-10-CM | POA: Diagnosis not present

## 2017-05-26 DIAGNOSIS — C50411 Malignant neoplasm of upper-outer quadrant of right female breast: Secondary | ICD-10-CM | POA: Diagnosis not present

## 2017-05-26 DIAGNOSIS — R590 Localized enlarged lymph nodes: Secondary | ICD-10-CM | POA: Diagnosis not present

## 2017-05-26 DIAGNOSIS — Z171 Estrogen receptor negative status [ER-]: Secondary | ICD-10-CM | POA: Insufficient documentation

## 2017-05-26 DIAGNOSIS — R918 Other nonspecific abnormal finding of lung field: Secondary | ICD-10-CM | POA: Insufficient documentation

## 2017-05-26 MED ORDER — IOPAMIDOL (ISOVUE-300) INJECTION 61%
100.0000 mL | Freq: Once | INTRAVENOUS | Status: AC | PRN
Start: 2017-05-26 — End: 2017-05-26
  Administered 2017-05-26: 100 mL via INTRAVENOUS

## 2017-05-26 MED ORDER — IOPAMIDOL (ISOVUE-300) INJECTION 61%
INTRAVENOUS | Status: AC
  Filled 2017-05-26: qty 100

## 2017-05-27 ENCOUNTER — Other Ambulatory Visit (HOSPITAL_BASED_OUTPATIENT_CLINIC_OR_DEPARTMENT_OTHER): Payer: 59

## 2017-05-27 ENCOUNTER — Ambulatory Visit (HOSPITAL_BASED_OUTPATIENT_CLINIC_OR_DEPARTMENT_OTHER): Payer: 59

## 2017-05-27 DIAGNOSIS — Z5111 Encounter for antineoplastic chemotherapy: Secondary | ICD-10-CM

## 2017-05-27 DIAGNOSIS — C50411 Malignant neoplasm of upper-outer quadrant of right female breast: Secondary | ICD-10-CM

## 2017-05-27 DIAGNOSIS — Z171 Estrogen receptor negative status [ER-]: Principal | ICD-10-CM

## 2017-05-27 LAB — CBC WITH DIFFERENTIAL/PLATELET
BASO%: 0.3 % (ref 0.0–2.0)
BASOS ABS: 0 10*3/uL (ref 0.0–0.1)
EOS ABS: 0 10*3/uL (ref 0.0–0.5)
EOS%: 0.7 % (ref 0.0–7.0)
HEMATOCRIT: 30.4 % — AB (ref 34.8–46.6)
HEMOGLOBIN: 10 g/dL — AB (ref 11.6–15.9)
LYMPH#: 1 10*3/uL (ref 0.9–3.3)
LYMPH%: 22.4 % (ref 14.0–49.7)
MCH: 27.1 pg (ref 25.1–34.0)
MCHC: 32.9 g/dL (ref 31.5–36.0)
MCV: 82.3 fL (ref 79.5–101.0)
MONO#: 0.5 10*3/uL (ref 0.1–0.9)
MONO%: 10.8 % (ref 0.0–14.0)
NEUT#: 2.9 10*3/uL (ref 1.5–6.5)
NEUT%: 65.8 % (ref 38.4–76.8)
PLATELETS: 361 10*3/uL (ref 145–400)
RBC: 3.69 10*6/uL — ABNORMAL LOW (ref 3.70–5.45)
RDW: 22.6 % — AB (ref 11.2–14.5)
WBC: 4.4 10*3/uL (ref 3.9–10.3)

## 2017-05-27 LAB — COMPREHENSIVE METABOLIC PANEL
ALBUMIN: 3.4 g/dL — AB (ref 3.5–5.0)
ALK PHOS: 91 U/L (ref 40–150)
ALT: 34 U/L (ref 0–55)
ANION GAP: 8 meq/L (ref 3–11)
AST: 28 U/L (ref 5–34)
BUN: 5.3 mg/dL — AB (ref 7.0–26.0)
CALCIUM: 9.2 mg/dL (ref 8.4–10.4)
CO2: 26 mEq/L (ref 22–29)
Chloride: 107 mEq/L (ref 98–109)
Creatinine: 0.8 mg/dL (ref 0.6–1.1)
EGFR: >90 mL/min/{1.73_m2} (ref >90)
Glucose: 119 mg/dl (ref 70–140)
POTASSIUM: 3.1 meq/L — AB (ref 3.5–5.1)
Sodium: 141 mEq/L (ref 136–145)
Total Bilirubin: 0.43 mg/dL (ref 0.20–1.20)
Total Protein: 7 g/dL (ref 6.4–8.3)

## 2017-05-27 MED ORDER — SODIUM CHLORIDE 0.9 % IV SOLN
2000.0000 mg | Freq: Once | INTRAVENOUS | Status: AC
  Administered 2017-05-27: 2000 mg via INTRAVENOUS
  Filled 2017-05-27: qty 52.6

## 2017-05-27 MED ORDER — SODIUM CHLORIDE 0.9 % IV SOLN
Freq: Once | INTRAVENOUS | Status: AC
  Administered 2017-05-27: 13:00:00 via INTRAVENOUS

## 2017-05-27 MED ORDER — PALONOSETRON HCL INJECTION 0.25 MG/5ML
INTRAVENOUS | Status: AC
  Filled 2017-05-27: qty 5

## 2017-05-27 MED ORDER — DEXAMETHASONE SODIUM PHOSPHATE 10 MG/ML IJ SOLN
10.0000 mg | Freq: Once | INTRAMUSCULAR | Status: AC
  Administered 2017-05-27: 10 mg via INTRAVENOUS

## 2017-05-27 MED ORDER — SODIUM CHLORIDE 0.9% FLUSH
10.0000 mL | INTRAVENOUS | Status: DC | PRN
  Administered 2017-05-27: 10 mL
  Filled 2017-05-27: qty 10

## 2017-05-27 MED ORDER — HEPARIN SOD (PORK) LOCK FLUSH 100 UNIT/ML IV SOLN
500.0000 [IU] | Freq: Once | INTRAVENOUS | Status: AC | PRN
  Administered 2017-05-27: 500 [IU]
  Filled 2017-05-27: qty 5

## 2017-05-27 MED ORDER — DEXAMETHASONE SODIUM PHOSPHATE 10 MG/ML IJ SOLN
INTRAMUSCULAR | Status: AC
  Filled 2017-05-27: qty 1

## 2017-05-27 MED ORDER — SODIUM CHLORIDE 0.9 % IV SOLN
290.8000 mg | Freq: Once | INTRAVENOUS | Status: AC
  Administered 2017-05-27: 290 mg via INTRAVENOUS
  Filled 2017-05-27: qty 29

## 2017-05-27 MED ORDER — PALONOSETRON HCL INJECTION 0.25 MG/5ML
0.2500 mg | Freq: Once | INTRAVENOUS | Status: AC
  Administered 2017-05-27: 0.25 mg via INTRAVENOUS

## 2017-05-27 NOTE — Patient Instructions (Signed)
  [facility] Discharge Instructions for Patients Receiving Chemotherapy  Today you received the following chemotherapy agents Gemzar/Carboplatin To help prevent nausea and vomiting after your treatment, we encourage you to take your nausea medication as prescribed.  If you develop nausea and vomiting that is not controlled by your nausea medication, call the clinic.   BELOW ARE SYMPTOMS THAT SHOULD BE REPORTED IMMEDIATELY:  *FEVER GREATER THAN 100.5 F  *CHILLS WITH OR WITHOUT FEVER  NAUSEA AND VOMITING THAT IS NOT CONTROLLED WITH YOUR NAUSEA MEDICATION  *UNUSUAL SHORTNESS OF BREATH  *UNUSUAL BRUISING OR BLEEDING  TENDERNESS IN MOUTH AND THROAT WITH OR WITHOUT PRESENCE OF ULCERS  *URINARY PROBLEMS  *BOWEL PROBLEMS  UNUSUAL RASH Items with * indicate a potential emergency and should be followed up as soon as possible.  Feel free to call the clinic you have any questions or concerns. The clinic phone number is [phone].  Please show the CHEMO ALERT CARD at check-in to the Emergency Department and triage nurse.   

## 2017-06-03 ENCOUNTER — Other Ambulatory Visit (HOSPITAL_BASED_OUTPATIENT_CLINIC_OR_DEPARTMENT_OTHER): Payer: 59

## 2017-06-03 ENCOUNTER — Ambulatory Visit (HOSPITAL_BASED_OUTPATIENT_CLINIC_OR_DEPARTMENT_OTHER): Payer: 59 | Admitting: Hematology and Oncology

## 2017-06-03 ENCOUNTER — Ambulatory Visit (HOSPITAL_BASED_OUTPATIENT_CLINIC_OR_DEPARTMENT_OTHER): Payer: 59

## 2017-06-03 ENCOUNTER — Encounter: Payer: Self-pay | Admitting: Hematology and Oncology

## 2017-06-03 ENCOUNTER — Other Ambulatory Visit: Payer: Self-pay

## 2017-06-03 DIAGNOSIS — C642 Malignant neoplasm of left kidney, except renal pelvis: Secondary | ICD-10-CM

## 2017-06-03 DIAGNOSIS — C773 Secondary and unspecified malignant neoplasm of axilla and upper limb lymph nodes: Secondary | ICD-10-CM

## 2017-06-03 DIAGNOSIS — C50411 Malignant neoplasm of upper-outer quadrant of right female breast: Secondary | ICD-10-CM

## 2017-06-03 DIAGNOSIS — Z5111 Encounter for antineoplastic chemotherapy: Secondary | ICD-10-CM

## 2017-06-03 DIAGNOSIS — Z171 Estrogen receptor negative status [ER-]: Principal | ICD-10-CM

## 2017-06-03 LAB — CBC WITH DIFFERENTIAL/PLATELET
BASO%: 1.6 % (ref 0.0–2.0)
Basophils Absolute: 0.1 10*3/uL (ref 0.0–0.1)
EOS%: 0.3 % (ref 0.0–7.0)
Eosinophils Absolute: 0 10*3/uL (ref 0.0–0.5)
HEMATOCRIT: 30.5 % — AB (ref 34.8–46.6)
HGB: 10.1 g/dL — ABNORMAL LOW (ref 11.6–15.9)
LYMPH#: 1.5 10*3/uL (ref 0.9–3.3)
LYMPH%: 48.3 % (ref 14.0–49.7)
MCH: 26.9 pg (ref 25.1–34.0)
MCHC: 33.1 g/dL (ref 31.5–36.0)
MCV: 81.3 fL (ref 79.5–101.0)
MONO#: 0.3 10*3/uL (ref 0.1–0.9)
MONO%: 7.9 % (ref 0.0–14.0)
NEUT%: 41.9 % (ref 38.4–76.8)
NEUTROS ABS: 1.3 10*3/uL — AB (ref 1.5–6.5)
NRBC: 0 % (ref 0–0)
Platelets: 336 10*3/uL (ref 145–400)
RBC: 3.75 10*6/uL (ref 3.70–5.45)
RDW: 20.4 % — AB (ref 11.2–14.5)
WBC: 3.2 10*3/uL — AB (ref 3.9–10.3)

## 2017-06-03 LAB — COMPREHENSIVE METABOLIC PANEL
ALT: 55 U/L (ref 0–55)
AST: 42 U/L — ABNORMAL HIGH (ref 5–34)
Albumin: 3.7 g/dL (ref 3.5–5.0)
Alkaline Phosphatase: 93 U/L (ref 40–150)
Anion Gap: 9 mEq/L (ref 3–11)
BILIRUBIN TOTAL: 0.32 mg/dL (ref 0.20–1.20)
BUN: 6 mg/dL — ABNORMAL LOW (ref 7.0–26.0)
CALCIUM: 10 mg/dL (ref 8.4–10.4)
CO2: 25 meq/L (ref 22–29)
CREATININE: 0.8 mg/dL (ref 0.6–1.1)
Chloride: 107 mEq/L (ref 98–109)
EGFR: >90 mL/min/{1.73_m2} (ref >90)
Glucose: 105 mg/dl (ref 70–140)
Potassium: 3.7 mEq/L (ref 3.5–5.1)
Sodium: 141 mEq/L (ref 136–145)
TOTAL PROTEIN: 7.7 g/dL (ref 6.4–8.3)

## 2017-06-03 LAB — TECHNOLOGIST REVIEW

## 2017-06-03 MED ORDER — PALONOSETRON HCL INJECTION 0.25 MG/5ML
0.2500 mg | Freq: Once | INTRAVENOUS | Status: AC
  Administered 2017-06-03: 0.25 mg via INTRAVENOUS

## 2017-06-03 MED ORDER — DEXAMETHASONE SODIUM PHOSPHATE 10 MG/ML IJ SOLN
10.0000 mg | Freq: Once | INTRAMUSCULAR | Status: AC
  Administered 2017-06-03: 10 mg via INTRAVENOUS

## 2017-06-03 MED ORDER — CARBOPLATIN CHEMO INJECTION 450 MG/45ML
290.0000 mg | Freq: Once | INTRAVENOUS | Status: AC
  Administered 2017-06-03: 290 mg via INTRAVENOUS
  Filled 2017-06-03: qty 29

## 2017-06-03 MED ORDER — SODIUM CHLORIDE 0.9 % IV SOLN
Freq: Once | INTRAVENOUS | Status: AC
  Administered 2017-06-03: 13:00:00 via INTRAVENOUS

## 2017-06-03 MED ORDER — SODIUM CHLORIDE 0.9 % IV SOLN
2000.0000 mg | Freq: Once | INTRAVENOUS | Status: AC
  Administered 2017-06-03: 2000 mg via INTRAVENOUS
  Filled 2017-06-03: qty 52.6

## 2017-06-03 MED ORDER — PALONOSETRON HCL INJECTION 0.25 MG/5ML
INTRAVENOUS | Status: AC
  Filled 2017-06-03: qty 5

## 2017-06-03 MED ORDER — DEXAMETHASONE SODIUM PHOSPHATE 10 MG/ML IJ SOLN
INTRAMUSCULAR | Status: AC
Start: 2017-06-03 — End: 2017-06-03
  Filled 2017-06-03: qty 1

## 2017-06-03 MED ORDER — HEPARIN SOD (PORK) LOCK FLUSH 100 UNIT/ML IV SOLN
500.0000 [IU] | Freq: Once | INTRAVENOUS | Status: AC | PRN
  Administered 2017-06-03: 500 [IU]
  Filled 2017-06-03: qty 5

## 2017-06-03 MED ORDER — SODIUM CHLORIDE 0.9% FLUSH
10.0000 mL | INTRAVENOUS | Status: DC | PRN
  Administered 2017-06-03: 10 mL
  Filled 2017-06-03: qty 10

## 2017-06-03 NOTE — Progress Notes (Signed)
Per Dr. [name], ok to proceed with treatment with [place] 1.3.

## 2017-06-03 NOTE — Patient Instructions (Signed)
[facility] Discharge Instructions for Patients Receiving Chemotherapy  Today you received the following chemotherapy agents: Gemzar and Carboplatin   To help prevent nausea and vomiting after your treatment, we encourage you to take your nausea medication as directed.    If you develop nausea and vomiting that is not controlled by your nausea medication, call the clinic.   BELOW ARE SYMPTOMS THAT SHOULD BE REPORTED IMMEDIATELY:  *FEVER GREATER THAN 100.5 F  *CHILLS WITH OR WITHOUT FEVER  NAUSEA AND VOMITING THAT IS NOT CONTROLLED WITH YOUR NAUSEA MEDICATION  *UNUSUAL SHORTNESS OF BREATH  *UNUSUAL BRUISING OR BLEEDING  TENDERNESS IN MOUTH AND THROAT WITH OR WITHOUT PRESENCE OF ULCERS  *URINARY PROBLEMS  *BOWEL PROBLEMS  UNUSUAL RASH Items with * indicate a potential emergency and should be followed up as soon as possible.  Feel free to call the clinic you have any questions or concerns. The clinic phone number is [phone].  Please show the CHEMO ALERT CARD at check-in to the Emergency Department and triage nurse.   

## 2017-06-03 NOTE — Progress Notes (Signed)
 Patient Care Team: Griffin, Elaine, MD as PCP - General (Family Medicine) Magrinat, Gustav C, MD as Consulting Physician (Oncology) Wentworth, Stacy, MD (Inactive) as Consulting Physician (Radiation Oncology) Stuart, Dawn C, RN as Registered Nurse Martini, Keisha N, RN as Registered Nurse Newman, David, MD as Consulting Physician (General Surgery) Mackey, Heather Thompson, NP as Nurse Practitioner (Hematology and Oncology)  DIAGNOSIS:  Encounter Diagnosis  Name Primary?  . Malignant neoplasm of upper-outer quadrant of right breast in female, estrogen receptor negative (HCC)     SUMMARY OF ONCOLOGIC HISTORY:   Breast cancer of upper-outer quadrant of right female breast (HCC)   01/19/2015 Mammogram    Right breast: density with indistinct margin at 12:00, posterior depth; also oval mass with obscured margin at 11:00, middle depth; irregular mass with indistinct margin central to nipple, middle depth      01/23/2015 Initial Biopsy    Right breast 9:00 and 10:00 biopsy ([facility]): Grade 3 IDC ER/PR negative, HER-2 negative, Ki-67 77% and 73%; the right axillary lymph node biopsy: ER 22% positive      01/30/2015 Breast MRI    Right breast masses 9 and 10:00 position, additional irregular masses with non-mass enhancement upper outer quadrant with one mass extending into upper inner quadrant, multifocal, multicentric 13.7 x 4.6 x 4.6 cm, malignant right axillary lymph node      02/08/2015 Clinical Stage    Stage IIIA: T3, N1, M0      02/09/2015 Procedure    BreastNext panel revealed VUS BRCA2 gene called [id number] ([id number]); otherwise no clinically significant variant at ATM, BARD1, BRCA1, BRCA2, BRIP1, CDH1, CHEK2, MRE11A, MUTYH, NBN, NF1, PALB2, PTEN, RAD50, RAD51C, RAD51D, and TP53      02/15/2015 Imaging    No evidence of metastatic disease, 2.2 cm enhancing lesion anterior left lower kidney suspicious for solid renal neoplasm      02/20/2015 - 07/10/2015 Neo-Adjuvant Chemotherapy   dose dense doxorubicin and cyclophosphamide x 4 followed by Abraxane weekly x 12      07/10/2015 Breast MRI    Multiple irregular enhancing masses right breast are less confluent and smaller dominant mass 10:00 2.1 x 1.9 x 1.3 cm; right breast 11:00 mass measures 1.2 x 1.2 x 0.8 cm, decreased right axillary lymph node thickening      07/28/2015 Definitive Surgery    Rt mastectomy: Multifocal IDC 2 cm, 1.5 cm. 0.6 cm; 3/13 LN positive, ER/PR HER-2      07/28/2015 Pathologic Stage    Stage IIA: T1c N1 M0        09/03/2015 - 10/13/2015 Radiation Therapy    Adjuvant radiation therapy: Right chest wall/50.4 Gray @ 1.8 Gray per fraction x 28 fractions. Right supraclavicular fossa 45 Gy @1.8 Gy per fraction x 25 fractions      11/15/2015 - 05/10/2016 Chemotherapy    Adjuvant capecitabine - completed 8 cycles      11/16/2015 Survivorship    Survivorship visit completed and copy of care plan given to patient      02/23/2016 Procedure    Left mandible biopsy: Scant material, no malignancy      05/21/2016 -  Anti-estrogen oral therapy    Patient did not take tamoxifen because her ER was weakly positive at 22% and she was worried about toxicities      02/21/2017 Imaging    Rt Axilla 8 X 7 X 7 mm mass inv pectoralis musculature, additional mass 9 mm 1.5 cm apart      02/21/2017 Relapse/Recurrence      Right axillary mass biopsy: 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      03/25/2017 -  Chemotherapy    Carboplatin and gemcitabine palliative chemotherapy       Renal cell carcinoma of left kidney ([place])   01/29/2016 Surgery    Left renal mass partial resection: 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 She also had recent CT scans and is here to discuss those results. Overall she is tolerating chemotherapy extremely well. She does not have any nausea vomiting. She had profound pain in the right pectoral mass which is also improved significantly with chemotherapy.  REVIEW OF SYSTEMS:   Constitutional: Denies fevers, chills or abnormal weight loss Eyes: Denies blurriness of vision Ears, nose, mouth, throat, and face: Denies mucositis or sore throat Respiratory: Denies cough, dyspnea or wheezes Cardiovascular: Denies palpitation, chest discomfort Gastrointestinal:  Denies nausea, heartburn or change in bowel habits Skin: Denies abnormal skin rashes Lymphatics: Denies new lymphadenopathy or easy bruising Neurological:Denies numbness, tingling or new weaknesses Behavioral/Psych: Mood is stable, no new changes  Extremities: No lower extremity edema Breast: Right subpectoral mass All other systems were reviewed with the patient and are negative.  I have reviewed the past medical history, past surgical history, social history and family history with the patient and they are unchanged from previous note.  ALLERGIES:  is allergic to sulfa antibiotics.  MEDICATIONS:  Current Outpatient Prescriptions  Medication Sig Dispense Refill  . benazepril (LOTENSIN) 10 MG tablet Take 1 tablet (10 mg total) by mouth daily.    . Black Pepper-Turmeric 3-500 MG CAPS Take 1 capsule by mouth daily.    Marland Kitchen dexamethasone (DECADRON) 4 MG tablet Take 1 tablet (4 mg total) by mouth daily. Start the day after chemotherapy for 2 days. Take with food. (Patient taking differently: Take 4 mg by mouth every 7 (seven) days. Start the day after chemotherapy for 2 days. Take with food.) 30 tablet 1  . dexamethasone (DECADRON) 4 MG tablet Take 4 mg by mouth every 7 (seven) days. wednesday    . Ginger, Zingiber officinalis, (GINGER ROOT PO) Take 1  tablet by mouth daily.    Marland Kitchen HYDROcodone-acetaminophen (NORCO/VICODIN) 5-325 MG tablet Take 1-2 tablets by mouth every 4 (four) hours as needed for moderate pain. (Patient taking differently: Take 1-2 tablets by mouth at bedtime as needed for moderate pain. ) 90 tablet 0  . lidocaine-prilocaine (EMLA) cream Apply to affected area once 30 g 3  . LORazepam (ATIVAN) 0.5 MG tablet Take 1 tablet (0.5 mg total) by mouth every 6 (six) hours as needed (Nausea or vomiting). 30 tablet 0  . Multiple Vitamins-Minerals (MULTIVITAMIN) tablet Take 1 tablet by mouth daily.    . ondansetron (ZOFRAN) 8 MG tablet Take 1 tablet (8 mg total) by mouth 2 (two) times daily as needed for refractory nausea / vomiting. Start on day 3 after chemotherapy. (Patient not taking: Reported on 03/25/2017) 30 tablet 1  . pregabalin (LYRICA) 50 MG capsule Take 1 capsule (50 mg total) by mouth 2 (two) times daily. 60 capsule 0  . Probiotic Product (PROBIOTIC PO) Take 1 tablet by mouth daily.    Marland Kitchen  prochlorperazine (COMPAZINE) 10 MG tablet Take 1 tablet (10 mg total) by mouth every 6 (six) hours as needed (Nausea or vomiting). (Patient not taking: Reported on 03/25/2017) 30 tablet 1   No current facility-administered medications for this visit.     PHYSICAL EXAMINATION: ECOG PERFORMANCE STATUS: 1 - Symptomatic but completely ambulatory  Vitals:   06/03/17 1023  BP: (!) 151/90  Pulse: 89  Resp: 18  Temp: 98.3 F (36.8 C)  SpO2: 100%   Filed Weights   06/03/17 1023  Weight: 175 lb 11.2 oz (79.7 kg)    GENERAL:alert, no distress and comfortable SKIN: skin color, texture, turgor are normal, no rashes or significant lesions EYES: normal, Conjunctiva are pink and non-injected, sclera clear OROPHARYNX:no exudate, no erythema and lips, buccal mucosa, and tongue normal  NECK: supple, thyroid normal size, non-tender, without nodularity LYMPH:  no palpable lymphadenopathy in the cervical, axillary or inguinal LUNGS: clear to  auscultation and percussion with normal breathing effort HEART: regular rate & rhythm and no murmurs and no lower extremity edema ABDOMEN:abdomen soft, non-tender and normal bowel sounds MUSCULOSKELETAL:no cyanosis of digits and no clubbing  NEURO: alert & oriented x 3 with fluent speech, no focal motor/sensory deficits EXTREMITIES: No lower extremity edema BREAST: No palpable masses or nodules in either right or left breasts. No palpable axillary supraclavicular or infraclavicular adenopathy no breast tenderness or nipple discharge. (exam performed in the presence of a chaperone)  LABORATORY DATA:  I have reviewed the data as listed   Chemistry      Component Value Date/Time   NA 141 05/27/2017 1203   K 3.1 (L) 05/27/2017 1203   CL 104 04/23/2016 1551   CO2 26 05/27/2017 1203   BUN 5.3 (L) 05/27/2017 1203   CREATININE 0.8 05/27/2017 1203      Component Value Date/Time   CALCIUM 9.2 05/27/2017 1203   ALKPHOS 91 05/27/2017 1203   AST 28 05/27/2017 1203   ALT 34 05/27/2017 1203   BILITOT 0.43 05/27/2017 1203       Lab Results  Component Value Date   WBC 3.2 (L) 06/03/2017   HGB 10.1 (L) 06/03/2017   HCT 30.5 (L) 06/03/2017   MCV 81.3 06/03/2017   PLT 336 06/03/2017   NEUTROABS 1.3 (L) 06/03/2017    ASSESSMENT & PLAN:  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 Rt mastectomy 07/28/15: Multifocal IDC 2 cm, 1.5 cm. 0.6 cm; 3/13 LN positive, T1c N1 M0 stage IIA, ER/PR HER-2 negative. 2. Adjuvant XRT 09/03/15 to 10/13/15 3. Adjuvant Xeloda X 6 months (8 cycles) started 11/15/2015 completed July  2017 ----------------------------------------------------------------------- PET-CT scan 03/03/17: Hypermet rec tumor in right pectoralis musculature, Rt Hilar, Rt Para tracheal and Rt Int Mamm LN mets, Solit Left scapular mets, Sol 8 mm lesion in Rt liver mets, Several sub cm lung nodules.  Rt Axillary LN Biopsy: Triple negative right breast cancer Bronchoscopic Biopsy at [facility]: Poorly differentiated metastatic adenocarcinoma triple negative -------------------------------------------------------------- Current treatment:Carboplatin-gemcitabine days 1 and 8 every 3 weeks started 03/14/2017. Today is cycle 4  Chemotherapy toxicities: 1. Mild nausea 2. Constipation after chemotherapy  CT CAP 05/26/2017: Stable infiltrative soft tissue mass in the right axilla/right pectoralis minor muscle, stable right internal mammary and right paratracheal lymph nodes, solid tree stable right liver metastases, stable right pulmonary nodules, stable left scapular lytic bone metastases, new mild left inguinal lymphadenopathy. Interval growth of a small elliptical left T3 vertebral body lesion concerning for progression   I do not believe that these findings of left inguinal lymph node or the T3 lesion or significant enough to warrant change in treatment. We will continue with 3 more cycles of the same chemotherapy and then perform a PET/CT scan.  Return to clinic in 3 weeks for cycle 5   I spent 25 minutes talking to the patient of which more than half was spent in counseling and coordination of care.  No orders of the defined types were placed in this encounter.  The patient has a good understanding of the overall plan. she agrees with it. she will call with any problems that may develop before the next visit here.   Rulon Eisenmenger, MD 06/03/17

## 2017-06-03 NOTE — Assessment & Plan Note (Signed)
Right breast invasive ductal carcinoma multifocal, multicentric disease with at least 8 tumors, 2.3 cm, 1.6 cm of the biggest tumors in addition 6 more tumors 1 cm or less spanning 13.7 cm, right axillary lymph node biopsy positive (ER 22%); primary tumor is ER 0%, PR 0%, HER-2 negative, Ki-67 77% and 73% respectively, grade 3 T3 N1 M0 equals stage IIIa, Grade 3 1. Rt mastectomy 07/28/15: Multifocal IDC 2 cm, 1.5 cm. 0.6 cm; 3/13 LN positive, T1c N1 M0 stage IIA, ER/PR HER-2 negative. 2. Adjuvant XRT 09/03/15 to 10/13/15 3. Adjuvant Xeloda X 6 months (8 cycles) started 11/15/2015 completed July 2017 ----------------------------------------------------------------------- PET-CT scan 03/03/17: Hypermet rec tumor in right pectoralis musculature, Rt Hilar, Rt Para tracheal and Rt Int Mamm LN mets, Solit Left scapular mets, Sol 8 mm lesion in Rt liver mets, Several sub cm lung nodules.  Rt Axillary LN Biopsy: Triple negative right breast cancer [facility]: Poorly differentiated metastatic adenocarcinoma triple negative -------------------------------------------------------------- Current treatment:Carboplatin-gemcitabine days 1 and 8 every 3 weeks started 03/14/2017. Today is cycle 4  Chemotherapy toxicities: 1. Mild nausea 2. Constipation after chemotherapy  CT CAP 05/26/2017: Stable infiltrative soft tissue mass in the right axilla/right pectoralis minor muscle, stable right internal mammary and right paratracheal lymph nodes, solid tree stable right liver metastases, stable right pulmonary nodules, stable left scapular lytic bone metastases, new mild left inguinal lymphadenopathy. Interval growth of a small elliptical left T3 vertebral body lesion concerning for progression Return to clinic in 3 weeks for cycle 4  I do not believe that these findings of left inguinal lymph node or the T3 lesion or significant enough to warrant change in treatment. We will continue with 3 more  cycles of the same chemotherapy and then perform a PET/CT scan.

## 2017-06-05 ENCOUNTER — Telehealth: Payer: Self-pay | Admitting: Hematology and Oncology

## 2017-06-05 NOTE — Telephone Encounter (Signed)
sw pt to confirm 9/5 appt at 1115 per sch msg

## 2017-06-17 ENCOUNTER — Other Ambulatory Visit: Payer: 59

## 2017-06-17 ENCOUNTER — Ambulatory Visit: Payer: 59 | Admitting: Hematology and Oncology

## 2017-06-18 ENCOUNTER — Encounter: Payer: Self-pay | Admitting: Hematology and Oncology

## 2017-06-18 ENCOUNTER — Other Ambulatory Visit: Payer: Self-pay

## 2017-06-18 ENCOUNTER — Other Ambulatory Visit: Payer: Self-pay | Admitting: Hematology and Oncology

## 2017-06-18 ENCOUNTER — Ambulatory Visit (HOSPITAL_BASED_OUTPATIENT_CLINIC_OR_DEPARTMENT_OTHER): Payer: 59

## 2017-06-18 ENCOUNTER — Encounter (INDEPENDENT_AMBULATORY_CARE_PROVIDER_SITE_OTHER): Payer: Self-pay

## 2017-06-18 ENCOUNTER — Ambulatory Visit (HOSPITAL_BASED_OUTPATIENT_CLINIC_OR_DEPARTMENT_OTHER): Payer: 59 | Admitting: Hematology and Oncology

## 2017-06-18 ENCOUNTER — Other Ambulatory Visit (HOSPITAL_BASED_OUTPATIENT_CLINIC_OR_DEPARTMENT_OTHER): Payer: 59

## 2017-06-18 DIAGNOSIS — C642 Malignant neoplasm of left kidney, except renal pelvis: Secondary | ICD-10-CM

## 2017-06-18 DIAGNOSIS — Z171 Estrogen receptor negative status [ER-]: Principal | ICD-10-CM

## 2017-06-18 DIAGNOSIS — C50411 Malignant neoplasm of upper-outer quadrant of right female breast: Secondary | ICD-10-CM

## 2017-06-18 DIAGNOSIS — C773 Secondary and unspecified malignant neoplasm of axilla and upper limb lymph nodes: Secondary | ICD-10-CM | POA: Diagnosis not present

## 2017-06-18 DIAGNOSIS — R110 Nausea: Secondary | ICD-10-CM | POA: Diagnosis not present

## 2017-06-18 DIAGNOSIS — Z5111 Encounter for antineoplastic chemotherapy: Secondary | ICD-10-CM

## 2017-06-18 DIAGNOSIS — K5900 Constipation, unspecified: Secondary | ICD-10-CM

## 2017-06-18 LAB — CBC WITH DIFFERENTIAL/PLATELET
BASO%: 0.4 % (ref 0.0–2.0)
Basophils Absolute: 0 10*3/uL (ref 0.0–0.1)
EOS%: 0.7 % (ref 0.0–7.0)
Eosinophils Absolute: 0 10*3/uL (ref 0.0–0.5)
HCT: 31.9 % — ABNORMAL LOW (ref 34.8–46.6)
HGB: 10.6 g/dL — ABNORMAL LOW (ref 11.6–15.9)
LYMPH%: 21.1 % (ref 14.0–49.7)
MCH: 28 pg (ref 25.1–34.0)
MCHC: 33.2 g/dL (ref 31.5–36.0)
MCV: 84.2 fL (ref 79.5–101.0)
MONO#: 0.9 10*3/uL (ref 0.1–0.9)
MONO%: 15.2 % — AB (ref 0.0–14.0)
NEUT#: 3.5 10*3/uL (ref 1.5–6.5)
NEUT%: 62.6 % (ref 38.4–76.8)
Platelets: 261 10*3/uL (ref 145–400)
RBC: 3.79 10*6/uL (ref 3.70–5.45)
RDW: 21.7 % — ABNORMAL HIGH (ref 11.2–14.5)
WBC: 5.7 10*3/uL (ref 3.9–10.3)
lymph#: 1.2 10*3/uL (ref 0.9–3.3)

## 2017-06-18 LAB — COMPREHENSIVE METABOLIC PANEL
ALT: 44 U/L (ref 0–55)
AST: 31 U/L (ref 5–34)
Albumin: 3.8 g/dL (ref 3.5–5.0)
Alkaline Phosphatase: 100 U/L (ref 40–150)
Anion Gap: 13 mEq/L — ABNORMAL HIGH (ref 3–11)
BUN: 9 mg/dL (ref 7.0–26.0)
CHLORIDE: 105 meq/L (ref 98–109)
CO2: 22 meq/L (ref 22–29)
Calcium: 10 mg/dL (ref 8.4–10.4)
Creatinine: 0.8 mg/dL (ref 0.6–1.1)
EGFR: >90 mL/min/{1.73_m2} (ref >90)
GLUCOSE: 78 mg/dL (ref 70–140)
POTASSIUM: 3.2 meq/L — AB (ref 3.5–5.1)
SODIUM: 140 meq/L (ref 136–145)
Total Bilirubin: 0.38 mg/dL (ref 0.20–1.20)
Total Protein: 8.1 g/dL (ref 6.4–8.3)

## 2017-06-18 MED ORDER — SODIUM CHLORIDE 0.9% FLUSH
10.0000 mL | INTRAVENOUS | Status: DC | PRN
  Administered 2017-06-18: 10 mL
  Filled 2017-06-18: qty 10

## 2017-06-18 MED ORDER — HEPARIN SOD (PORK) LOCK FLUSH 100 UNIT/ML IV SOLN
500.0000 [IU] | Freq: Once | INTRAVENOUS | Status: AC | PRN
  Administered 2017-06-18: 500 [IU]
  Filled 2017-06-18: qty 5

## 2017-06-18 MED ORDER — SODIUM CHLORIDE 0.9 % IV SOLN
290.8000 mg | Freq: Once | INTRAVENOUS | Status: AC
  Administered 2017-06-18: 290 mg via INTRAVENOUS
  Filled 2017-06-18: qty 29

## 2017-06-18 MED ORDER — DEXAMETHASONE SODIUM PHOSPHATE 10 MG/ML IJ SOLN
10.0000 mg | Freq: Once | INTRAMUSCULAR | Status: AC
  Administered 2017-06-18: 10 mg via INTRAVENOUS

## 2017-06-18 MED ORDER — PALONOSETRON HCL INJECTION 0.25 MG/5ML
0.2500 mg | Freq: Once | INTRAVENOUS | Status: AC
  Administered 2017-06-18: 0.25 mg via INTRAVENOUS

## 2017-06-18 MED ORDER — SODIUM CHLORIDE 0.9 % IV SOLN
Freq: Once | INTRAVENOUS | Status: AC
  Administered 2017-06-18: 13:00:00 via INTRAVENOUS

## 2017-06-18 MED ORDER — GEMCITABINE HCL CHEMO INJECTION 1 GM/26.3ML
2000.0000 mg | Freq: Once | INTRAVENOUS | Status: AC
  Administered 2017-06-18: 2000 mg via INTRAVENOUS
  Filled 2017-06-18: qty 52.6

## 2017-06-18 MED ORDER — PALONOSETRON HCL INJECTION 0.25 MG/5ML
INTRAVENOUS | Status: AC
  Filled 2017-06-18: qty 5

## 2017-06-18 MED ORDER — DEXAMETHASONE SODIUM PHOSPHATE 10 MG/ML IJ SOLN
INTRAMUSCULAR | Status: AC
  Filled 2017-06-18: qty 1

## 2017-06-18 NOTE — Patient Instructions (Signed)
[facility] Discharge Instructions for Patients Receiving Chemotherapy  Today you received the following chemotherapy agents: Carboplatin and Gemzar   To help prevent nausea and vomiting after your treatment, we encourage you to take your nausea medication as directed.    If you develop nausea and vomiting that is not controlled by your nausea medication, call the clinic.   BELOW ARE SYMPTOMS THAT SHOULD BE REPORTED IMMEDIATELY:  *FEVER GREATER THAN 100.5 F  *CHILLS WITH OR WITHOUT FEVER  NAUSEA AND VOMITING THAT IS NOT CONTROLLED WITH YOUR NAUSEA MEDICATION  *UNUSUAL SHORTNESS OF BREATH  *UNUSUAL BRUISING OR BLEEDING  TENDERNESS IN MOUTH AND THROAT WITH OR WITHOUT PRESENCE OF ULCERS  *URINARY PROBLEMS  *BOWEL PROBLEMS  UNUSUAL RASH Items with * indicate a potential emergency and should be followed up as soon as possible.  Feel free to call the clinic you have any questions or concerns. The clinic phone number is [phone].  Please show the CHEMO ALERT CARD at check-in to the Emergency Department and triage nurse.   

## 2017-06-18 NOTE — Progress Notes (Signed)
Per Dr.Gudena, okay to proceed with treatment today without CMET results.  CMET ordered and notified lab. Extra tube sample was obtained to run cmet today.

## 2017-06-18 NOTE — Assessment & Plan Note (Signed)
Right breast invasive ductal carcinoma multifocal, multicentric disease with at least 8 tumors, 2.3 cm, 1.6 cm of the biggest tumors in addition 6 more tumors 1 cm or less spanning 13.7 cm, right axillary lymph node biopsy positive (ER 22%); primary tumor is ER 0%, PR 0%, HER-2 negative, Ki-67 77% and 73% respectively, grade 3 T3 N1 M0 equals stage IIIa, Grade 3 1. Rt mastectomy 07/28/15: Multifocal IDC 2 cm, 1.5 cm. 0.6 cm; 3/13 LN positive, T1c N1 M0 stage IIA, ER/PR HER-2 negative. 2. Adjuvant XRT 09/03/15 to 10/13/15 3. Adjuvant Xeloda X 6 months (8 cycles) started 11/15/2015 completed July 2017 ----------------------------------------------------------------------- PET-CT scan 03/03/17: Hypermet rec tumor in right pectoralis musculature, Rt Hilar, Rt Para tracheal and Rt Int Mamm LN mets, Solit Left scapular mets, Sol 8 mm lesion in Rt liver mets, Several sub cm lung nodules.  Rt Axillary LN Biopsy: Triple negative right breast cancer Bronchoscopic Biopsy at [facility]: Poorly differentiated metastatic adenocarcinoma triple negative -------------------------------------------------------------- Current treatment:Carboplatin-gemcitabine days 1 and 8 every 3 weeks started 03/14/2017. Today is cycle 5  Chemotherapy toxicities: 1. Mild nausea 2. Constipation after chemotherapy  CT 05/26/2017: Stable findings. Plan is to perform PET CT scan after 6 cycles of chemotherapy.

## 2017-06-18 NOTE — Progress Notes (Signed)
 Patient Care Team: Griffin, Elaine, MD as PCP - General (Family Medicine) Magrinat, Gustav C, MD as Consulting Physician (Oncology) Wentworth, Stacy, MD (Inactive) as Consulting Physician (Radiation Oncology) Stuart, Dawn C, RN as Registered Nurse Martini, Keisha N, RN as Registered Nurse Newman, David, MD as Consulting Physician (General Surgery) Mackey, Heather Thompson, NP as Nurse Practitioner (Hematology and Oncology)  DIAGNOSIS:  Encounter Diagnosis  Name Primary?  . Malignant neoplasm of upper-outer quadrant of right breast in female, estrogen receptor negative (HCC)     SUMMARY OF ONCOLOGIC HISTORY:   Breast cancer of upper-outer quadrant of right female breast (HCC)   01/19/2015 Mammogram    Right breast: density with indistinct margin at 12:00, posterior depth; also oval mass with obscured margin at 11:00, middle depth; irregular mass with indistinct margin central to nipple, middle depth      01/23/2015 Initial Biopsy    Right breast 9:00 and 10:00 biopsy ([facility]): Grade 3 IDC ER/PR negative, HER-2 negative, Ki-67 77% and 73%; the right axillary lymph node biopsy: ER 22% positive      01/30/2015 Breast MRI    Right breast masses 9 and 10:00 position, additional irregular masses with non-mass enhancement upper outer quadrant with one mass extending into upper inner quadrant, multifocal, multicentric 13.7 x 4.6 x 4.6 cm, malignant right axillary lymph node      02/08/2015 Clinical Stage    Stage IIIA: T3, N1, M0      02/09/2015 Procedure    BreastNext panel revealed VUS BRCA2 gene called [id number] (c.[id number]>G); otherwise no clinically significant variant at ATM, BARD1, BRCA1, BRCA2, BRIP1, CDH1, CHEK2, MRE11A, MUTYH, NBN, NF1, PALB2, PTEN, RAD50, RAD51C, RAD51D, and TP53      02/15/2015 Imaging    No evidence of metastatic disease, 2.2 cm enhancing lesion anterior left lower kidney suspicious for solid renal neoplasm      02/20/2015 - 07/10/2015 Neo-Adjuvant Chemotherapy   dose dense doxorubicin and cyclophosphamide x 4 followed by Abraxane weekly x 12      07/10/2015 Breast MRI    Multiple irregular enhancing masses right breast are less confluent and smaller dominant mass 10:00 2.1 x 1.9 x 1.3 cm; right breast 11:00 mass measures 1.2 x 1.2 x 0.8 cm, decreased right axillary lymph node thickening      07/28/2015 Definitive Surgery    Rt mastectomy: Multifocal IDC 2 cm, 1.5 cm. 0.6 cm; 3/13 LN positive, ER/PR HER-2      07/28/2015 Pathologic Stage    Stage IIA: T1c N1 M0        09/03/2015 - 10/13/2015 Radiation Therapy    Adjuvant radiation therapy: Right chest wall/50.4 Gray @ 1.8 Gray per fraction x 28 fractions. Right supraclavicular fossa 45 Gy @1.8 Gy per fraction x 25 fractions      11/15/2015 - 05/10/2016 Chemotherapy    Adjuvant capecitabine - completed 8 cycles      11/16/2015 Survivorship    Survivorship visit completed and copy of care plan given to patient      02/23/2016 Procedure    Left mandible biopsy: Scant material, no malignancy      05/21/2016 -  Anti-estrogen oral therapy    Patient did not take tamoxifen because her ER was weakly positive at 22% and she was worried about toxicities      02/21/2017 Imaging    Rt Axilla 8 X 7 X 7 mm mass inv pectoralis musculature, additional mass 9 mm 1.5 cm apart      02/21/2017 Relapse/Recurrence      Right axillary mass biopsy: 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      03/24/2017 Miscellaneous    Foundation 1 analysis: Microsatellite stable; TMB 9 muts/Mb; MYC amplification, PIK3C2B p717L; TP53 [id number]      03/25/2017 -  Chemotherapy    Carboplatin and gemcitabine palliative chemotherapy       Renal cell carcinoma of left kidney ([place])   01/29/2016 Surgery    Left renal mass partial resection: 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 Today is cycle 5. She had remarkable improvement in her right chest wall pain since he started chemotherapy. She has been tolerating the treatment extremely well. She does not have any nausea vomiting. The pain in the right chest wall is 4 out of 10.  REVIEW OF SYSTEMS:   Constitutional: Denies fevers, chills or abnormal weight loss Eyes: Denies blurriness of vision Ears, nose, mouth, throat, and face: Denies mucositis or sore throat Respiratory: Denies cough, dyspnea or wheezes Cardiovascular: Denies palpitation, chest discomfort Gastrointestinal:  Denies nausea, heartburn or change in bowel habits Skin: Denies abnormal skin rashes Lymphatics: Denies new lymphadenopathy or easy bruising Neurological:Denies numbness, tingling or new weaknesses Behavioral/Psych: Mood is stable, no new changes  Extremities: No lower extremity edema All other systems were reviewed with the patient and are negative.  I have reviewed the past medical history, past surgical history, social history and family history with the patient and they are unchanged from previous note.  ALLERGIES:  is allergic to sulfa antibiotics.  MEDICATIONS:  Current Outpatient Prescriptions  Medication Sig Dispense Refill  . benazepril (LOTENSIN) 10 MG tablet Take 1 tablet (10 mg total) by mouth daily.    . Black Pepper-Turmeric 3-500 MG CAPS Take 1 capsule by mouth daily.    Marland Kitchen dexamethasone (DECADRON) 4 MG tablet Take 1 tablet (4 mg total) by mouth daily. Start the day after chemotherapy for 2 days. Take with food. (Patient taking differently: Take 4 mg by mouth every 7 (seven) days. Start the day after chemotherapy for 2 days. Take with food.) 30 tablet 1  . dexamethasone (DECADRON) 4 MG tablet Take 4 mg by mouth every 7 (seven) days. wednesday    . Ginger,  Zingiber officinalis, (GINGER ROOT PO) Take 1 tablet by mouth daily.    Marland Kitchen HYDROcodone-acetaminophen (NORCO/VICODIN) 5-325 MG tablet Take 1-2 tablets by mouth every 4 (four) hours as needed for moderate pain. (Patient taking differently: Take 1-2 tablets by mouth at bedtime as needed for moderate pain. ) 90 tablet 0  . lidocaine-prilocaine (EMLA) cream Apply to affected area once 30 g 3  . LORazepam (ATIVAN) 0.5 MG tablet Take 1 tablet (0.5 mg total) by mouth every 6 (six) hours as needed (Nausea or vomiting). 30 tablet 0  . Multiple Vitamins-Minerals (MULTIVITAMIN) tablet Take 1 tablet by mouth daily.    . ondansetron (ZOFRAN) 8 MG tablet Take 1 tablet (8 mg total) by mouth 2 (two) times daily as needed for refractory nausea / vomiting. Start on day 3 after chemotherapy. (Patient not taking: Reported on 03/25/2017) 30 tablet 1  . Probiotic Product (PROBIOTIC PO) Take 1 tablet by mouth daily.    . prochlorperazine (COMPAZINE) 10 MG tablet Take 1  tablet (10 mg total) by mouth every 6 (six) hours as needed (Nausea or vomiting). (Patient not taking: Reported on 03/25/2017) 30 tablet 1   No current facility-administered medications for this visit.     PHYSICAL EXAMINATION: ECOG PERFORMANCE STATUS: 1 - Symptomatic but completely ambulatory  Vitals:   06/18/17 1137  BP: (!) 150/89  Pulse: 82  Resp: 18  Temp: 98.4 F (36.9 C)  SpO2: 99%   Filed Weights   06/18/17 1137  Weight: 174 lb 14.4 oz (79.3 kg)    GENERAL:alert, no distress and comfortable SKIN: skin color, texture, turgor are normal, no rashes or significant lesions EYES: normal, Conjunctiva are pink and non-injected, sclera clear OROPHARYNX:no exudate, no erythema and lips, buccal mucosa, and tongue normal  NECK: supple, thyroid normal size, non-tender, without nodularity LYMPH:  no palpable lymphadenopathy in the cervical, axillary or inguinal LUNGS: clear to auscultation and percussion with normal breathing effort HEART: regular  rate & rhythm and no murmurs and no lower extremity edema ABDOMEN:abdomen soft, non-tender and normal bowel sounds MUSCULOSKELETAL:no cyanosis of digits and no clubbing  NEURO: alert & oriented x 3 with fluent speech, no focal motor/sensory deficits EXTREMITIES: No lower extremity edema  LABORATORY DATA:  I have reviewed the data as listed   Chemistry      Component Value Date/Time   NA 141 06/03/2017 1005   K 3.7 06/03/2017 1005   CL 104 04/23/2016 1551   CO2 25 06/03/2017 1005   BUN 6.0 (L) 06/03/2017 1005   CREATININE 0.8 06/03/2017 1005      Component Value Date/Time   CALCIUM 10.0 06/03/2017 1005   ALKPHOS 93 06/03/2017 1005   AST 42 (H) 06/03/2017 1005   ALT 55 06/03/2017 1005   BILITOT 0.32 06/03/2017 1005       Lab Results  Component Value Date   WBC 5.7 06/18/2017   HGB 10.6 (L) 06/18/2017   HCT 31.9 (L) 06/18/2017   MCV 84.2 06/18/2017   PLT 261 06/18/2017   NEUTROABS 3.5 06/18/2017    ASSESSMENT & PLAN:  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 Rt mastectomy 07/28/15: Multifocal IDC 2 cm, 1.5 cm. 0.6 cm; 3/13 LN positive, T1c N1 M0 stage IIA, ER/PR HER-2 negative. 2. Adjuvant XRT 09/03/15 to 10/13/15 3. Adjuvant Xeloda X 6 months (8 cycles) started 11/15/2015 completed July 2017 ----------------------------------------------------------------------- PET-CT scan 03/03/17: Hypermet rec tumor in right pectoralis musculature, Rt Hilar, Rt Para tracheal and Rt Int Mamm LN mets, Solit Left scapular mets, Sol 8 mm lesion in Rt liver mets, Several sub cm lung nodules.  Rt Axillary LN  Biopsy: Triple negative right breast cancer [facility]: Poorly differentiated metastatic adenocarcinoma triple negative -------------------------------------------------------------- Current treatment:Carboplatin-gemcitabine days 1 and 8 every 3 weeks started 03/14/2017. Today is cycle 5  Chemotherapy toxicities: 1. Mild nausea 2. Constipation after chemotherapy  CT 05/26/2017: Stable findings. Plan is to perform PET CT scan after 6 cycles of chemotherapy.   I spent 25 minutes talking to the patient of which more than half was spent in counseling and coordination of care.  Orders Placed  This Encounter  Procedures  . NM PET Image Restag (PS) Skull Base To Thigh    Standing Status:   Future    Standing Expiration Date:   06/18/2018    Order Specific Question:   If indicated for the ordered procedure, I authorize the administration of a radiopharmaceutical per Radiology protocol    Answer:   Yes    Order Specific Question:   Is the patient pregnant?    Answer:   No    Order Specific Question:   Preferred imaging location?    Answer:   [facility]    Order Specific Question:   Radiology Contrast Protocol - do NOT remove file path    Answer:   [email or web address]    Order Specific Question:   Reason for Exam additional comments    Answer:   Metastatic breast cancer restaging   The patient has a good understanding of the overall plan. she agrees with it. she will call with any problems that may develop before the next visit here.   Rulon Eisenmenger, MD 06/18/17

## 2017-06-24 ENCOUNTER — Ambulatory Visit: Payer: 59

## 2017-06-24 ENCOUNTER — Other Ambulatory Visit: Payer: 59

## 2017-06-24 ENCOUNTER — Ambulatory Visit: Payer: 59 | Admitting: Hematology and Oncology

## 2017-06-26 ENCOUNTER — Other Ambulatory Visit (HOSPITAL_BASED_OUTPATIENT_CLINIC_OR_DEPARTMENT_OTHER): Payer: 59

## 2017-06-26 ENCOUNTER — Encounter (INDEPENDENT_AMBULATORY_CARE_PROVIDER_SITE_OTHER): Payer: Self-pay

## 2017-06-26 ENCOUNTER — Other Ambulatory Visit: Payer: Self-pay

## 2017-06-26 ENCOUNTER — Ambulatory Visit (HOSPITAL_BASED_OUTPATIENT_CLINIC_OR_DEPARTMENT_OTHER): Payer: 59

## 2017-06-26 VITALS — BP 158/95 | HR 74 | Temp 98.5°F | Resp 18

## 2017-06-26 DIAGNOSIS — Z171 Estrogen receptor negative status [ER-]: Principal | ICD-10-CM

## 2017-06-26 DIAGNOSIS — C50411 Malignant neoplasm of upper-outer quadrant of right female breast: Secondary | ICD-10-CM | POA: Diagnosis not present

## 2017-06-26 DIAGNOSIS — Z5111 Encounter for antineoplastic chemotherapy: Secondary | ICD-10-CM | POA: Diagnosis not present

## 2017-06-26 LAB — CBC WITH DIFFERENTIAL/PLATELET
BASO%: 0.3 % (ref 0.0–2.0)
Basophils Absolute: 0 10*3/uL (ref 0.0–0.1)
EOS%: 0.8 % (ref 0.0–7.0)
Eosinophils Absolute: 0 10*3/uL (ref 0.0–0.5)
HEMATOCRIT: 29.6 % — AB (ref 34.8–46.6)
HEMOGLOBIN: 9.7 g/dL — AB (ref 11.6–15.9)
LYMPH#: 1.3 10*3/uL (ref 0.9–3.3)
LYMPH%: 33.7 % (ref 14.0–49.7)
MCH: 27.9 pg (ref 25.1–34.0)
MCHC: 32.8 g/dL (ref 31.5–36.0)
MCV: 85.1 fL (ref 79.5–101.0)
MONO#: 0.2 10*3/uL (ref 0.1–0.9)
MONO%: 5.4 % (ref 0.0–14.0)
NEUT#: 2.2 10*3/uL (ref 1.5–6.5)
NEUT%: 59.8 % (ref 38.4–76.8)
Platelets: 180 10*3/uL (ref 145–400)
RBC: 3.48 10*6/uL — ABNORMAL LOW (ref 3.70–5.45)
RDW: 20.6 % — AB (ref 11.2–14.5)
WBC: 3.7 10*3/uL — AB (ref 3.9–10.3)

## 2017-06-26 LAB — BASIC METABOLIC PANEL
ANION GAP: 10 meq/L (ref 3–11)
BUN: 8.6 mg/dL (ref 7.0–26.0)
CALCIUM: 9.9 mg/dL (ref 8.4–10.4)
CO2: 24 meq/L (ref 22–29)
Chloride: 107 mEq/L (ref 98–109)
Creatinine: 0.8 mg/dL (ref 0.6–1.1)
EGFR: >90 mL/min/{1.73_m2} (ref >90)
GLUCOSE: 97 mg/dL (ref 70–140)
POTASSIUM: 3.9 meq/L (ref 3.5–5.1)
SODIUM: 141 meq/L (ref 136–145)

## 2017-06-26 MED ORDER — SODIUM CHLORIDE 0.9 % IV SOLN
2000.0000 mg | Freq: Once | INTRAVENOUS | Status: AC
  Administered 2017-06-26: 2000 mg via INTRAVENOUS
  Filled 2017-06-26: qty 52.6

## 2017-06-26 MED ORDER — PALONOSETRON HCL INJECTION 0.25 MG/5ML
0.2500 mg | Freq: Once | INTRAVENOUS | Status: AC
  Administered 2017-06-26: 0.25 mg via INTRAVENOUS

## 2017-06-26 MED ORDER — SODIUM CHLORIDE 0.9% FLUSH
10.0000 mL | INTRAVENOUS | Status: DC | PRN
  Administered 2017-06-26: 10 mL
  Filled 2017-06-26: qty 10

## 2017-06-26 MED ORDER — PALONOSETRON HCL INJECTION 0.25 MG/5ML
INTRAVENOUS | Status: AC
Start: 2017-06-26 — End: 2017-06-26
  Filled 2017-06-26: qty 5

## 2017-06-26 MED ORDER — SODIUM CHLORIDE 0.9 % IV SOLN
290.0000 mg | Freq: Once | INTRAVENOUS | Status: AC
  Administered 2017-06-26: 290 mg via INTRAVENOUS
  Filled 2017-06-26: qty 29

## 2017-06-26 MED ORDER — SODIUM CHLORIDE 0.9 % IV SOLN
Freq: Once | INTRAVENOUS | Status: AC
  Administered 2017-06-26: 10:00:00 via INTRAVENOUS

## 2017-06-26 MED ORDER — DEXAMETHASONE SODIUM PHOSPHATE 10 MG/ML IJ SOLN
10.0000 mg | Freq: Once | INTRAMUSCULAR | Status: AC
  Administered 2017-06-26: 10 mg via INTRAVENOUS

## 2017-06-26 MED ORDER — HEPARIN SOD (PORK) LOCK FLUSH 100 UNIT/ML IV SOLN
500.0000 [IU] | Freq: Once | INTRAVENOUS | Status: AC | PRN
  Administered 2017-06-26: 500 [IU]
  Filled 2017-06-26: qty 5

## 2017-06-26 MED ORDER — DEXAMETHASONE SODIUM PHOSPHATE 10 MG/ML IJ SOLN
INTRAMUSCULAR | Status: AC
Start: 2017-06-26 — End: 2017-06-26
  Filled 2017-06-26: qty 1

## 2017-06-26 NOTE — Patient Instructions (Signed)
[facility] Discharge Instructions for Patients Receiving Chemotherapy  Today you received the following chemotherapy agents Carboplatin, Gemzar  To help prevent nausea and vomiting after your treatment, we encourage you to take your nausea medication as directed  If you develop nausea and vomiting that is not controlled by your nausea medication, call the clinic.   BELOW ARE SYMPTOMS THAT SHOULD BE REPORTED IMMEDIATELY:  *FEVER GREATER THAN 100.5 F  *CHILLS WITH OR WITHOUT FEVER  NAUSEA AND VOMITING THAT IS NOT CONTROLLED WITH YOUR NAUSEA MEDICATION  *UNUSUAL SHORTNESS OF BREATH  *UNUSUAL BRUISING OR BLEEDING  TENDERNESS IN MOUTH AND THROAT WITH OR WITHOUT PRESENCE OF ULCERS  *URINARY PROBLEMS  *BOWEL PROBLEMS  UNUSUAL RASH Items with * indicate a potential emergency and should be followed up as soon as possible.  Feel free to call the clinic you have any questions or concerns. The clinic phone number is [phone].  Please show the [place] at check-in to the Emergency Department and triage nurse.

## 2017-07-01 ENCOUNTER — Other Ambulatory Visit: Payer: 59

## 2017-07-01 ENCOUNTER — Ambulatory Visit: Payer: 59

## 2017-07-08 ENCOUNTER — Ambulatory Visit (HOSPITAL_BASED_OUTPATIENT_CLINIC_OR_DEPARTMENT_OTHER): Payer: 59

## 2017-07-08 ENCOUNTER — Other Ambulatory Visit (HOSPITAL_BASED_OUTPATIENT_CLINIC_OR_DEPARTMENT_OTHER): Payer: 59

## 2017-07-08 VITALS — BP 154/87 | HR 75 | Temp 98.0°F | Resp 18

## 2017-07-08 DIAGNOSIS — Z171 Estrogen receptor negative status [ER-]: Principal | ICD-10-CM

## 2017-07-08 DIAGNOSIS — Z5111 Encounter for antineoplastic chemotherapy: Secondary | ICD-10-CM | POA: Diagnosis not present

## 2017-07-08 DIAGNOSIS — C50411 Malignant neoplasm of upper-outer quadrant of right female breast: Secondary | ICD-10-CM

## 2017-07-08 LAB — COMPREHENSIVE METABOLIC PANEL
ALBUMIN: 3.7 g/dL (ref 3.5–5.0)
ALK PHOS: 109 U/L (ref 40–150)
ALT: 58 U/L — ABNORMAL HIGH (ref 0–55)
AST: 46 U/L — AB (ref 5–34)
Anion Gap: 9 mEq/L (ref 3–11)
BUN: 4.2 mg/dL — AB (ref 7.0–26.0)
CHLORIDE: 106 meq/L (ref 98–109)
CO2: 26 mEq/L (ref 22–29)
Calcium: 9.5 mg/dL (ref 8.4–10.4)
Creatinine: 0.8 mg/dL (ref 0.6–1.1)
EGFR: >90 mL/min/{1.73_m2} (ref >90)
GLUCOSE: 89 mg/dL (ref 70–140)
POTASSIUM: 3.4 meq/L — AB (ref 3.5–5.1)
Sodium: 141 mEq/L (ref 136–145)
Total Bilirubin: 0.39 mg/dL (ref 0.20–1.20)
Total Protein: 7.9 g/dL (ref 6.4–8.3)

## 2017-07-08 LAB — CBC WITH DIFFERENTIAL/PLATELET
BASO%: 0.3 % (ref 0.0–2.0)
BASOS ABS: 0 10*3/uL (ref 0.0–0.1)
EOS ABS: 0 10*3/uL (ref 0.0–0.5)
EOS%: 0.3 % (ref 0.0–7.0)
HEMATOCRIT: 30.9 % — AB (ref 34.8–46.6)
HEMOGLOBIN: 10.3 g/dL — AB (ref 11.6–15.9)
LYMPH#: 0.8 10*3/uL — AB (ref 0.9–3.3)
LYMPH%: 23.7 % (ref 14.0–49.7)
MCH: 29.1 pg (ref 25.1–34.0)
MCHC: 33.3 g/dL (ref 31.5–36.0)
MCV: 87.3 fL (ref 79.5–101.0)
MONO#: 0.4 10*3/uL (ref 0.1–0.9)
MONO%: 10.7 % (ref 0.0–14.0)
NEUT#: 2.3 10*3/uL (ref 1.5–6.5)
NEUT%: 65 % (ref 38.4–76.8)
PLATELETS: 185 10*3/uL (ref 145–400)
RBC: 3.54 10*6/uL — ABNORMAL LOW (ref 3.70–5.45)
RDW: 20.5 % — AB (ref 11.2–14.5)
WBC: 3.5 10*3/uL — ABNORMAL LOW (ref 3.9–10.3)

## 2017-07-08 MED ORDER — PALONOSETRON HCL INJECTION 0.25 MG/5ML
0.2500 mg | Freq: Once | INTRAVENOUS | Status: AC
  Administered 2017-07-08: 0.25 mg via INTRAVENOUS

## 2017-07-08 MED ORDER — CARBOPLATIN CHEMO INJECTION 450 MG/45ML
290.0000 mg | Freq: Once | INTRAVENOUS | Status: AC
  Administered 2017-07-08: 290 mg via INTRAVENOUS
  Filled 2017-07-08: qty 29

## 2017-07-08 MED ORDER — HEPARIN SOD (PORK) LOCK FLUSH 100 UNIT/ML IV SOLN
500.0000 [IU] | Freq: Once | INTRAVENOUS | Status: AC | PRN
  Administered 2017-07-08: 500 [IU]
  Filled 2017-07-08: qty 5

## 2017-07-08 MED ORDER — PALONOSETRON HCL INJECTION 0.25 MG/5ML
INTRAVENOUS | Status: AC
  Filled 2017-07-08: qty 5

## 2017-07-08 MED ORDER — DEXAMETHASONE SODIUM PHOSPHATE 10 MG/ML IJ SOLN
10.0000 mg | Freq: Once | INTRAMUSCULAR | Status: AC
  Administered 2017-07-08: 10 mg via INTRAVENOUS

## 2017-07-08 MED ORDER — DEXAMETHASONE SODIUM PHOSPHATE 10 MG/ML IJ SOLN
INTRAMUSCULAR | Status: AC
Start: 2017-07-08 — End: 2017-07-08
  Filled 2017-07-08: qty 1

## 2017-07-08 MED ORDER — GEMCITABINE HCL CHEMO INJECTION 1 GM/26.3ML
2000.0000 mg | Freq: Once | INTRAVENOUS | Status: AC
  Administered 2017-07-08: 2000 mg via INTRAVENOUS
  Filled 2017-07-08: qty 52.6

## 2017-07-08 MED ORDER — SODIUM CHLORIDE 0.9 % IV SOLN
Freq: Once | INTRAVENOUS | Status: AC
  Administered 2017-07-08: 10:00:00 via INTRAVENOUS

## 2017-07-08 MED ORDER — SODIUM CHLORIDE 0.9% FLUSH
10.0000 mL | INTRAVENOUS | Status: DC | PRN
  Administered 2017-07-08: 10 mL
  Filled 2017-07-08: qty 10

## 2017-07-08 NOTE — Patient Instructions (Signed)
[facility] Discharge Instructions for Patients Receiving Chemotherapy  Today you received the following chemotherapy agents Gemzar/Carboplatin To help prevent nausea and vomiting after your treatment, we encourage you to take your nausea medication as prescribed.   If you develop nausea and vomiting that is not controlled by your nausea medication, call the clinic.   BELOW ARE SYMPTOMS THAT SHOULD BE REPORTED IMMEDIATELY:  *FEVER GREATER THAN 100.5 F  *CHILLS WITH OR WITHOUT FEVER  NAUSEA AND VOMITING THAT IS NOT CONTROLLED WITH YOUR NAUSEA MEDICATION  *UNUSUAL SHORTNESS OF BREATH  *UNUSUAL BRUISING OR BLEEDING  TENDERNESS IN MOUTH AND THROAT WITH OR WITHOUT PRESENCE OF ULCERS  *URINARY PROBLEMS  *BOWEL PROBLEMS  UNUSUAL RASH Items with * indicate a potential emergency and should be followed up as soon as possible.  Feel free to call the clinic should you have any questions or concerns. The clinic phone number is [phone].  Please show the CHEMO ALERT CARD at check-in to the Emergency Department and triage nurse.   

## 2017-07-15 ENCOUNTER — Ambulatory Visit (HOSPITAL_BASED_OUTPATIENT_CLINIC_OR_DEPARTMENT_OTHER): Payer: 59

## 2017-07-15 ENCOUNTER — Other Ambulatory Visit (HOSPITAL_BASED_OUTPATIENT_CLINIC_OR_DEPARTMENT_OTHER): Payer: 59

## 2017-07-15 VITALS — BP 140/92 | HR 86 | Temp 98.6°F | Resp 16

## 2017-07-15 DIAGNOSIS — Z5111 Encounter for antineoplastic chemotherapy: Secondary | ICD-10-CM | POA: Diagnosis not present

## 2017-07-15 DIAGNOSIS — C50411 Malignant neoplasm of upper-outer quadrant of right female breast: Secondary | ICD-10-CM

## 2017-07-15 DIAGNOSIS — Z171 Estrogen receptor negative status [ER-]: Principal | ICD-10-CM

## 2017-07-15 LAB — CBC WITH DIFFERENTIAL/PLATELET
BASO%: 0.7 % (ref 0.0–2.0)
Basophils Absolute: 0 10*3/uL (ref 0.0–0.1)
EOS%: 0 % (ref 0.0–7.0)
Eosinophils Absolute: 0 10*3/uL (ref 0.0–0.5)
HEMATOCRIT: 30.5 % — AB (ref 34.8–46.6)
HGB: 10.2 g/dL — ABNORMAL LOW (ref 11.6–15.9)
LYMPH#: 1.3 10*3/uL (ref 0.9–3.3)
LYMPH%: 48.5 % (ref 14.0–49.7)
MCH: 29.2 pg (ref 25.1–34.0)
MCHC: 33.4 g/dL (ref 31.5–36.0)
MCV: 87.4 fL (ref 79.5–101.0)
MONO#: 0.1 10*3/uL (ref 0.1–0.9)
MONO%: 3.4 % (ref 0.0–14.0)
NEUT%: 47.4 % (ref 38.4–76.8)
NEUTROS ABS: 1.3 10*3/uL — AB (ref 1.5–6.5)
PLATELETS: 199 10*3/uL (ref 145–400)
RBC: 3.49 10*6/uL — AB (ref 3.70–5.45)
RDW: 18.9 % — AB (ref 11.2–14.5)
WBC: 2.7 10*3/uL — AB (ref 3.9–10.3)

## 2017-07-15 LAB — COMPREHENSIVE METABOLIC PANEL
ALT: 69 U/L — AB (ref 0–55)
ANION GAP: 12 meq/L — AB (ref 3–11)
AST: 34 U/L (ref 5–34)
Albumin: 4 g/dL (ref 3.5–5.0)
Alkaline Phosphatase: 118 U/L (ref 40–150)
BILIRUBIN TOTAL: 0.4 mg/dL (ref 0.20–1.20)
BUN: 8 mg/dL (ref 7.0–26.0)
CHLORIDE: 105 meq/L (ref 98–109)
CO2: 23 meq/L (ref 22–29)
CREATININE: 0.8 mg/dL (ref 0.6–1.1)
Calcium: 9.9 mg/dL (ref 8.4–10.4)
EGFR: >90 mL/min/{1.73_m2} (ref >90)
Glucose: 83 mg/dl (ref 70–140)
Potassium: 3.3 mEq/L — ABNORMAL LOW (ref 3.5–5.1)
Sodium: 141 mEq/L (ref 136–145)
TOTAL PROTEIN: 8.5 g/dL — AB (ref 6.4–8.3)

## 2017-07-15 MED ORDER — SODIUM CHLORIDE 0.9% FLUSH
10.0000 mL | INTRAVENOUS | Status: DC | PRN
Start: 2017-07-15 — End: 2017-07-15
  Administered 2017-07-15: 10 mL
  Filled 2017-07-15: qty 10

## 2017-07-15 MED ORDER — HEPARIN SOD (PORK) LOCK FLUSH 100 UNIT/ML IV SOLN
500.0000 [IU] | Freq: Once | INTRAVENOUS | Status: AC | PRN
  Administered 2017-07-15: 500 [IU]
  Filled 2017-07-15: qty 5

## 2017-07-15 MED ORDER — PALONOSETRON HCL INJECTION 0.25 MG/5ML
0.2500 mg | Freq: Once | INTRAVENOUS | Status: AC
  Administered 2017-07-15: 0.25 mg via INTRAVENOUS

## 2017-07-15 MED ORDER — DEXAMETHASONE SODIUM PHOSPHATE 10 MG/ML IJ SOLN
INTRAMUSCULAR | Status: AC
  Filled 2017-07-15: qty 1

## 2017-07-15 MED ORDER — SODIUM CHLORIDE 0.9 % IV SOLN
Freq: Once | INTRAVENOUS | Status: AC
  Administered 2017-07-15: 10:00:00 via INTRAVENOUS

## 2017-07-15 MED ORDER — CARBOPLATIN CHEMO INJECTION 450 MG/45ML
290.8000 mg | Freq: Once | INTRAVENOUS | Status: AC
  Administered 2017-07-15: 290 mg via INTRAVENOUS
  Filled 2017-07-15: qty 29

## 2017-07-15 MED ORDER — DEXAMETHASONE SODIUM PHOSPHATE 10 MG/ML IJ SOLN
10.0000 mg | Freq: Once | INTRAMUSCULAR | Status: AC
  Administered 2017-07-15: 10 mg via INTRAVENOUS

## 2017-07-15 MED ORDER — PALONOSETRON HCL INJECTION 0.25 MG/5ML
INTRAVENOUS | Status: AC
  Filled 2017-07-15: qty 5

## 2017-07-15 MED ORDER — SODIUM CHLORIDE 0.9 % IV SOLN
2000.0000 mg | Freq: Once | INTRAVENOUS | Status: AC
  Administered 2017-07-15: 2000 mg via INTRAVENOUS
  Filled 2017-07-15: qty 52.6

## 2017-07-15 NOTE — Patient Instructions (Signed)
[facility] Discharge Instructions for Patients Receiving Chemotherapy  Today you received the following chemotherapy agents Gemzar and Carboplatin   To help prevent nausea and vomiting after your treatment, we encourage you to take your nausea medication as directed.    If you develop nausea and vomiting that is not controlled by your nausea medication, call the clinic.   BELOW ARE SYMPTOMS THAT SHOULD BE REPORTED IMMEDIATELY:  *FEVER GREATER THAN 100.5 F  *CHILLS WITH OR WITHOUT FEVER  NAUSEA AND VOMITING THAT IS NOT CONTROLLED WITH YOUR NAUSEA MEDICATION  *UNUSUAL SHORTNESS OF BREATH  *UNUSUAL BRUISING OR BLEEDING  TENDERNESS IN MOUTH AND THROAT WITH OR WITHOUT PRESENCE OF ULCERS  *URINARY PROBLEMS  *BOWEL PROBLEMS  UNUSUAL RASH Items with * indicate a potential emergency and should be followed up as soon as possible.  Feel free to call the clinic should you have any questions or concerns. The clinic phone number is [phone].  Please show the CHEMO ALERT CARD at check-in to the Emergency Department and triage nurse.   

## 2017-07-15 NOTE — Progress Notes (Signed)
P/C to Dr Geralyn Flash nurse and received okay to treat with [place] of 1.3.

## 2017-07-16 ENCOUNTER — Telehealth: Payer: Self-pay | Admitting: *Deleted

## 2017-07-16 NOTE — Telephone Encounter (Signed)
Per voicemail from Dr. Leo Grosser, patient scheduled for a follow up appt with Dr. Denman George. Appt scheduled on October 15th at 11:45.

## 2017-07-28 ENCOUNTER — Ambulatory Visit: Payer: 59 | Attending: Gynecologic Oncology | Admitting: Gynecologic Oncology

## 2017-07-28 ENCOUNTER — Encounter: Payer: Self-pay | Admitting: Gynecologic Oncology

## 2017-07-28 ENCOUNTER — Ambulatory Visit (HOSPITAL_COMMUNITY)
Admission: RE | Admit: 2017-07-28 | Discharge: 2017-07-28 | Disposition: A | Discharge status: Home / Self Care | Payer: 59 | Source: Ambulatory Visit | Attending: Hematology and Oncology | Admitting: Hematology and Oncology

## 2017-07-28 VITALS — BP 148/73 | HR 78 | Temp 97.7°F | Resp 18 | Wt 171.3 lb

## 2017-07-28 DIAGNOSIS — R918 Other nonspecific abnormal finding of lung field: Secondary | ICD-10-CM | POA: Diagnosis not present

## 2017-07-28 DIAGNOSIS — Z85528 Personal history of other malignant neoplasm of kidney: Secondary | ICD-10-CM | POA: Diagnosis not present

## 2017-07-28 DIAGNOSIS — F329 Major depressive disorder, single episode, unspecified: Secondary | ICD-10-CM | POA: Diagnosis not present

## 2017-07-28 DIAGNOSIS — C7951 Secondary malignant neoplasm of bone: Secondary | ICD-10-CM

## 2017-07-28 DIAGNOSIS — G629 Polyneuropathy, unspecified: Secondary | ICD-10-CM | POA: Diagnosis not present

## 2017-07-28 DIAGNOSIS — R971 Elevated cancer antigen 125 [CA 125]: Secondary | ICD-10-CM | POA: Diagnosis not present

## 2017-07-28 DIAGNOSIS — Z79899 Other long term (current) drug therapy: Secondary | ICD-10-CM | POA: Diagnosis not present

## 2017-07-28 DIAGNOSIS — Z171 Estrogen receptor negative status [ER-]: Secondary | ICD-10-CM | POA: Diagnosis not present

## 2017-07-28 DIAGNOSIS — C787 Secondary malignant neoplasm of liver and intrahepatic bile duct: Secondary | ICD-10-CM

## 2017-07-28 DIAGNOSIS — C50411 Malignant neoplasm of upper-outer quadrant of right female breast: Secondary | ICD-10-CM | POA: Insufficient documentation

## 2017-07-28 DIAGNOSIS — N83202 Unspecified ovarian cyst, left side: Secondary | ICD-10-CM

## 2017-07-28 DIAGNOSIS — D573 Sickle-cell trait: Secondary | ICD-10-CM | POA: Diagnosis not present

## 2017-07-28 DIAGNOSIS — K769 Liver disease, unspecified: Secondary | ICD-10-CM | POA: Insufficient documentation

## 2017-07-28 DIAGNOSIS — C773 Secondary and unspecified malignant neoplasm of axilla and upper limb lymph nodes: Secondary | ICD-10-CM

## 2017-07-28 LAB — GLUCOSE, CAPILLARY: Glucose-Capillary: 93 mg/dL (ref 65–99)

## 2017-07-28 MED ORDER — FLUDEOXYGLUCOSE F - 18 (FDG) INJECTION
8.6200 | Freq: Once | INTRAVENOUS | Status: AC | PRN
  Administered 2017-07-28: 8.62 via INTRAVENOUS

## 2017-07-28 NOTE — Patient Instructions (Signed)
Please follow-up with Dr [name] as scheduled for your breast cancer treatment. There is no evidence for ovarian cancer on your ultrasound or PET scan. Therefore surgery on the ovaries and uterus is not recommended or necessary at this time. The elevation in CA 125 is likely secondary to metastatic breast cancer.  Please contact Dr Serita Grit office with questions. [place]

## 2017-07-28 NOTE — Progress Notes (Signed)
Follow-up Note: Gyn-Onc  Consult was requested by Dr. Leo Grosser for the evaluation of Phyllis Pruitt 46 y.o. female  CC:  Chief Complaint  Patient presents with  . Left ovarian cyst    Assessment/Plan:  Phyllis Pruitt  is a 46 y.o.  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  I have personally viewed her images from her PET scan of the abdomen and pelvis from today and her report from the pelvic US on 07/14/17.  I feel confident in stating that this patient does not have ovarian/FT or primary peritoneal cancer as a source of the CA 125 elevations. The mildly elevated CA 355 is almost certainly a function of her metastatic breast cancer. Given the progression in her cancer, I do not feel that a pause in cytotoxic therapy for a gynecologic surgery is indicated. I discussed this with Dr [name] who is in agreement.  She will follow-up with him as scheduled (tomorrow). She does not require follow-up with me at this time.  HPI: Phyllis Pruitt is a 46 year old G4P4 who is seen in consultation at the request of Dr Leo Grosser for a left ovarian cyst and elevated CA 125 in the setting of a personal hx of breast and renal cell carcinoma.  The patient Has a personal history of breast cancer and is treated with Dr. Payton Mccallum for this. As part of the workup for her history of breast cancer she underwent a CT scan of the abdomen and pelvis on 11/01/2015. This identified a 2 cm enhancing renal mass on the left concerning for renal carcinoma. It also identified an approximately 3 cm left ovarian cyst that was not commented on in the report but was visible upon evaluation of the images.  As part of workup for this she saw her Dr. the necessity Haygood and on 12/13/2015 received a transvaginal ultrasound scan which revealed a grossly normal uterus measuring 5.2 x 4.9 x 4 cm with a 3 mm endometrial stripe. The left ovary measured 1.8 x 1.9 x 1.28 cm and the  right ovary measured 2.08 x 2.2 x 1.15 cm. The left ovary contained a small simple appearing cystic structure measuring 1.1 x 0.6 x 1.0 cm with no flow seen to this area on call Doppler.  CA-125 had been drawn on 12/06/2015 and was elevated at 130 units per milliliter (upper limit of normal range is 35).  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 She is not recommended to have additional treatment at this time.  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 She has measurable disease in the pectoralis muscle, spine, lungs, liver, and inguinal nodes.  CA 125 had been rechecked on 12/12/16 and was elevated at 118. CA 125 was 165 on 07/02/17.  This prompted a TVUS to be performed on 07/14/17 which showed a small uterus and normal endometrium. The left and right ovaries were not well visualized but no masses or cysts were seen.  PET/CT on 07/28/17 was performed and revealed overall progression of metabolically active metastatic disease, as evidenced by a new liver lesion and new osseous lesions. Hypermetabolic right pectoral/subpectoral soft tissue and mediastinal/internal mammary lymph nodes are grossly stable. Scattered small pulmonary nodules, stable.  Continued attention on followup exams is warranted. There was no ovarian lesion, carcinomatosis or ascites identified.   Current Meds:  Outpatient Encounter Prescriptions as of 07/28/2017  Medication Sig  . benazepril (LOTENSIN) 10 MG tablet Take 1 tablet (10 mg total) by mouth daily.  . Black Pepper-Turmeric 3-500 MG CAPS Take 1 capsule by mouth daily.  Marland Kitchen dexamethasone (DECADRON) 4 MG tablet Take 1 tablet (4 mg total) by mouth daily. Start the day after chemotherapy for 2 days. Take with  food. (Patient taking differently: Take 4 mg by mouth every 7 (seven) days. Start the day after chemotherapy for 2 days. Take with food.)  . dexamethasone (DECADRON) 4 MG tablet Take 4 mg by mouth every 7 (seven) days. wednesday  . Ginger, Zingiber officinalis, (GINGER ROOT PO) Take 1 tablet by mouth daily.  Marland Kitchen HYDROcodone-acetaminophen (NORCO/VICODIN) 5-325 MG tablet Take 1-2 tablets by mouth every 4 (four) hours as needed for moderate pain. (Patient taking differently: Take 1-2 tablets by mouth at bedtime as needed for moderate pain. )  . lidocaine-prilocaine (EMLA) cream Apply to affected area once  . LORazepam (ATIVAN) 0.5 MG tablet Take 1 tablet (0.5 mg total) by mouth every 6 (six) hours as needed (Nausea or vomiting).  . Multiple Vitamins-Minerals (MULTIVITAMIN) tablet Take 1 tablet by mouth daily.  . ondansetron (ZOFRAN) 8 MG tablet Take 1 tablet (8 mg total) by mouth 2 (two) times daily as needed for refractory nausea / vomiting. Start on day 3 after chemotherapy.  . Probiotic Product (PROBIOTIC PO) Take 1 tablet by mouth daily.  . prochlorperazine (COMPAZINE) 10 MG tablet Take 1 tablet (10 mg total) by mouth every 6 (six) hours as needed (Nausea or vomiting).   No facility-administered encounter medications on file as of 07/28/2017.     Allergy:  Allergies  Allergen Reactions  . Sulfa Antibiotics Itching    Vaginal bleeding    Social Hx:   Social History   Social History  . Marital status: Married    Spouse name: N/A  . Number of children: N/A  . Years of education: N/A   Occupational History  . Not on file.   Social History Main Topics  . Smoking status: Never Smoker  . Smokeless tobacco: Never Used  . Alcohol use No  . Drug use: No  . Sexual activity: Not on file   Other Topics Concern  . Not on file   Social History Narrative  . No narrative on file    Past Surgical Hx:  Past Surgical History:  Procedure Laterality Date  . LATISSIMUS FLAP TO BREAST Right  06/03/2016   Procedure: RIGHT LATISSIMUS FLAP TO BREAST;  Surgeon: Irene Limbo, MD;  Location: [facility];  Service: Plastics;  Laterality: Right;  . LIPOSUCTION WITH LIPOFILLING Right 09/20/2016   Procedure: LIPOSUCTION WITH LIPOFILLING;  Surgeon: Irene Limbo, MD;  Location: [place] [facility];  Service: Plastics;  Laterality: Right;  LIPOSUCTION WITH LIPOFILLING  . MASTECTOMY    . MASTECTOMY MODIFIED RADICAL Right 07/28/2015  . MASTECTOMY MODIFIED RADICAL Right 07/28/2015   Procedure: RIGHT MODIFIED RADICAL MASTECTOMY;  Surgeon: Alphonsa Overall, MD;  Location: [facility];  Service: General;  Laterality: Right;  . PORT-A-CATH REMOVAL  07/28/2015  . PORT-A-CATH REMOVAL  07/28/2015   Procedure: REMOVAL PORT-A-CATH;  Surgeon: Alphonsa Overall, MD;  Location: [place];  Service: General;;  . PORTACATH PLACEMENT N/A 02/10/2015   Procedure: INSERTION PORT-A-CATH WITH ULTRA SOUND, left subclavian,;  Surgeon: Alphonsa Overall, MD;  Location: [facility];  Service: General;  Laterality: N/A;  . PORTACATH PLACEMENT N/A 04/04/2017   Procedure: POWER PORT PLACEMENT;  Surgeon: Alphonsa Overall, MD;  Location: [facility];  Service: General;  Laterality: N/A;  . REMOVAL OF TISSUE EXPANDER AND PLACEMENT OF IMPLANT Right 09/20/2016   Procedure: REMOVAL OF RIGHT TISSUE EXPANDER WITH PLACEMENT OF RIGHT SILICONE BREAST IMPLANTS LIPOFILLING FROM ABDOMEN TO RIGHT CHEST;  Surgeon: Irene Limbo, MD;  Location: [facility];  Service: Plastics;  Laterality: Right;  PLACEMENT OF RIGHT SILICONE BREAST IMPLANTS LIPOFILLING FROM ABDOMEN TO RIGHT CHEST  . ROBOTIC ASSITED PARTIAL NEPHRECTOMY Left 12/29/2015   Procedure: XI ROBOTIC ASSITED PARTIAL NEPHRECTOMY;  Surgeon: Cleon Gustin, MD;  Location: [facility];  Service: Urology;  Laterality: Left;  . TISSUE EXPANDER PLACEMENT Right 06/03/2016   Procedure: PLACEMENT RIGHT TISSUE EXPANDER;  Surgeon: Irene Limbo, MD;  Location: [facility];  Service: Plastics;  Laterality: Right;  .  TONSILLECTOMY    . TUBAL LIGATION    . wisdome teeth extraction      Past Medical Hx:  Past Medical History:  Diagnosis Date  . Anemia    during pregnancy  . Breast cancer of upper-outer quadrant of right female breast ([place]) 01/27/2015  . Depression    hx denies any problems now  . History of kidney cancer 12/2015   left removed "spot"  . Lymphedema    rt arm  . Neuropathy   . Sickle cell trait ([place])     Past Gynecological History:  SVD x 4  No LMP recorded. Patient is not currently having periods (Reason: Chemotherapy).  Family Hx: History reviewed. No pertinent family history.  Review of Systems:  Constitutional  Feels well,    ENT Normal appearing ears and nares bilaterally Skin/Breast  No rash, sores, jaundice, itching, dryness Cardiovascular  No chest pain, shortness of breath, or edema  Pulmonary  No cough or wheeze.  Gastro Intestinal  No nausea, vomitting, or diarrhoea. No bright red blood per rectum, no abdominal pain, change in bowel movement, or constipation.  Genito Urinary  No frequency, urgency, dysuria,  Musculo Skeletal  No myalgia, arthralgia, joint swelling or pain  Neurologic  No weakness, numbness, change in gait,  Psychology  No depression, anxiety, insomnia.   Vitals:  Blood pressure (!) 148/73, pulse 78, temperature 97.7 F (36.5 C), temperature source Oral, resp. rate 18, weight 171 lb 4.8 oz (77.7 kg), SpO2 100 %.  Physical Exam: WD in NAD Neck  Supple NROM, without any enlargements.  Lymph Node Survey No cervical supraclavicular or inguinal adenopathy Cardiovascular  Pulse normal rate, regularity and rhythm. S1 and S2 normal.  Lungs  Clear to auscultation bilateraly, without wheezes/crackles/rhonchi. Good air movement.  Skin  No rash/lesions/breakdown  Psychiatry  Alert and oriented to person, place, and time  Abdomen  Normoactive bowel sounds, abdomen soft, non-tender and obese without evidence of hernia.  Back No CVA  tenderness Genito Urinary  deferred Rectal  deferred Extremities  No bilateral cyanosis, clubbing or edema.   Donaciano Eva, MD  07/28/2017, 2:02 PM

## 2017-07-29 ENCOUNTER — Ambulatory Visit (HOSPITAL_BASED_OUTPATIENT_CLINIC_OR_DEPARTMENT_OTHER): Payer: 59 | Admitting: Hematology and Oncology

## 2017-07-29 ENCOUNTER — Other Ambulatory Visit (HOSPITAL_BASED_OUTPATIENT_CLINIC_OR_DEPARTMENT_OTHER): Payer: 59

## 2017-07-29 ENCOUNTER — Ambulatory Visit: Payer: 59

## 2017-07-29 DIAGNOSIS — Z171 Estrogen receptor negative status [ER-]: Secondary | ICD-10-CM

## 2017-07-29 DIAGNOSIS — C773 Secondary and unspecified malignant neoplasm of axilla and upper limb lymph nodes: Secondary | ICD-10-CM | POA: Diagnosis not present

## 2017-07-29 DIAGNOSIS — C50411 Malignant neoplasm of upper-outer quadrant of right female breast: Secondary | ICD-10-CM

## 2017-07-29 DIAGNOSIS — G893 Neoplasm related pain (acute) (chronic): Secondary | ICD-10-CM

## 2017-07-29 DIAGNOSIS — C787 Secondary malignant neoplasm of liver and intrahepatic bile duct: Secondary | ICD-10-CM | POA: Diagnosis not present

## 2017-07-29 DIAGNOSIS — M25511 Pain in right shoulder: Secondary | ICD-10-CM | POA: Diagnosis not present

## 2017-07-29 DIAGNOSIS — C7951 Secondary malignant neoplasm of bone: Secondary | ICD-10-CM | POA: Diagnosis not present

## 2017-07-29 LAB — COMPREHENSIVE METABOLIC PANEL
ALBUMIN: 3.7 g/dL (ref 3.5–5.0)
ALT: 60 U/L — AB (ref 0–55)
ANION GAP: 9 meq/L (ref 3–11)
AST: 37 U/L — AB (ref 5–34)
Alkaline Phosphatase: 127 U/L (ref 40–150)
BILIRUBIN TOTAL: 0.51 mg/dL (ref 0.20–1.20)
BUN: 5.3 mg/dL — AB (ref 7.0–26.0)
CALCIUM: 9.4 mg/dL (ref 8.4–10.4)
CO2: 26 mEq/L (ref 22–29)
CREATININE: 0.8 mg/dL (ref 0.6–1.1)
Chloride: 108 mEq/L (ref 98–109)
EGFR: >60 mL/min/{1.73_m2} (ref >60)
Glucose: 87 mg/dl (ref 70–140)
Potassium: 3.3 mEq/L — ABNORMAL LOW (ref 3.5–5.1)
Sodium: 143 mEq/L (ref 136–145)
TOTAL PROTEIN: 7.7 g/dL (ref 6.4–8.3)

## 2017-07-29 LAB — CBC WITH DIFFERENTIAL/PLATELET
BASO%: 0.5 % (ref 0.0–2.0)
BASOS ABS: 0 10*3/uL (ref 0.0–0.1)
EOS%: 0.2 % (ref 0.0–7.0)
Eosinophils Absolute: 0 10*3/uL (ref 0.0–0.5)
HEMATOCRIT: 29 % — AB (ref 34.8–46.6)
HEMOGLOBIN: 9.8 g/dL — AB (ref 11.6–15.9)
LYMPH%: 25.1 % (ref 14.0–49.7)
MCH: 30.9 pg (ref 25.1–34.0)
MCHC: 33.9 g/dL (ref 31.5–36.0)
MCV: 91.3 fL (ref 79.5–101.0)
MONO#: 0.4 10*3/uL (ref 0.1–0.9)
MONO%: 11.7 % (ref 0.0–14.0)
NEUT#: 1.9 10*3/uL (ref 1.5–6.5)
NEUT%: 62.5 % (ref 38.4–76.8)
PLATELETS: 213 10*3/uL (ref 145–400)
RBC: 3.18 10*6/uL — ABNORMAL LOW (ref 3.70–5.45)
RDW: 21.3 % — AB (ref 11.2–14.5)
WBC: 3 10*3/uL — ABNORMAL LOW (ref 3.9–10.3)
lymph#: 0.8 10*3/uL — ABNORMAL LOW (ref 0.9–3.3)

## 2017-07-29 NOTE — Progress Notes (Signed)
Histology and Location of Primary Cancer: Upper-outer quadrant of right breast in female disease progression to the liver and bone.  Sites of Visceral and Bony Metastatic Disease: liver and bone.  Location(s) of Symptomatic Metastases: Right shoulder down the right arm  Past/Anticipated chemotherapy by medical oncology, if any: Dr. [name] start [place] chemotherapy next Wednesday   -IMPRESSION: PET  07-28-17 1. Overall progression of metabolically active metastatic disease, as evidenced by a new liver lesion and new osseous lesions. 2. Hypermetabolic right pectoral/subpectoral soft tissue and mediastinal/internal mammary lymph nodes are grossly stable. 3. Scattered small pulmonary nodules, stable. Continued attention on followup exams is warranted.  03-25-17  Carboplatin and gemcitabine palliative chemotherapy    03-24-17 Foundation 1 analysis: Microsatellite stable; TMB 9 muts/Mb; MYC amplification, PIK3C2B p717L; TP53 R248Q,    02-21-17 [place] axilla right        04-04-17 Dr. Alphonsa Overall Power port placement                                 Pain on a scale of 0-10 is: 7/10 Right arm not taking anything accept Nyquil   If Spine Met(s), symptoms, if any, include:No  Bowel/Bladder retention or incontinence (please describe): Having a bowel movement daily and voiding every two to three hours with discomfort.  Numbness or weakness in extremities (please describe): Numbness in fingers right hand.  Current Decadron regimen, if applicable: 4 mg every 7 days on Wednesday has not been taking at home.  Ambulatory status? Walker? Wheelchair?: Ambulatory  SAFETY ISSUES: Prior radiation? :09-03-15  10-13-15  Adjuvant radiation therapy RCW and supraclavicular fossa   Pacemaker/ICD? : No  Possible current pregnancy? : No  Is the patient on methotrexate? : No  Current Complaints / other details: 46 y.o. married woman with recurrent triple negative right breast cancer disease progression  to liver and bone. History of renal cell carcinoma of left kidney.  Wt Readings from Last 3 Encounters:  07/31/17 165 lb 6.4 oz (75 kg)  07/29/17 170 lb 1.6 oz (77.2 kg)  07/28/17 171 lb 4.8 oz (77.7 kg)  BP (!) 138/94   Pulse 88   Temp 98.6 F (37 C) (Oral)   Resp 18   Ht '5\' 5"'$  (1.651 m)   Wt 165 lb 6.4 oz (75 kg)   SpO2 99%   BMI 27.52 kg/m  Left arm sitting BP (!) 134/109   Pulse (!) 103   Temp 98.6 F (37 C) (Oral)   Resp 18   Ht '5\' 5"'$  (1.651 m)   Wt 165 lb 6.4 oz (75 kg)   SpO2 99%   BMI 27.52 kg/m   Left arm standing

## 2017-07-29 NOTE — Assessment & Plan Note (Signed)
Right breast invasive ductal carcinoma multifocal, multicentric disease with at least 8 tumors, 2.3 cm, 1.6 cm of the biggest tumors in addition 6 more tumors 1 cm or less spanning 13.7 cm, right axillary lymph node biopsy positive (ER 22%); primary tumor is ER 0%, PR 0%, HER-2 negative, Ki-67 77% and 73% respectively, grade 3 T3 N1 M0 equals stage IIIa, Grade 3 1. Rt mastectomy 07/28/15: Multifocal IDC 2 cm, 1.5 cm. 0.6 cm; 3/13 LN positive, T1c N1 M0 stage IIA, ER/PR HER-2 negative. 2. Adjuvant XRT 09/03/15 to 10/13/15 3. Adjuvant Xeloda X 6 months (8 cycles) started 11/15/2015 completed July 2017 4. Current treatment: Carboplatin and gemcitabine started 03/14/2017 ----------------------------------------------------------------------- Rt Axillary LN Biopsy: Triple negative right breast cancer Bronchoscopic Biopsy at [facility]: Poorly differentiated metastatic adenocarcinoma triple negative  PET/CT 07/28/2017:Progression of metastatic disease new liver lesion on 0.6 cm SUV 7.2, previous liver lesion 1.4 cm unchanged but shows no activity; scattered hypermetabolic bone lesions some of which are new, scattered pulmonary nodules stable hypermetabolic right subpectoral and mediastinal/internal mammary nodes stable  Radiology counseling: because of progression of disease, We discussed changing therapy to [place] to start chemotherapy next week

## 2017-07-29 NOTE — Progress Notes (Signed)
 Patient Care Team: Griffin, Elaine, MD as PCP - General (Family Medicine) Magrinat, Gustav C, MD as Consulting Physician (Oncology) Wentworth, Stacy, MD (Inactive) as Consulting Physician (Radiation Oncology) Stuart, Dawn C, RN as Registered Nurse Martini, Keisha N, RN as Registered Nurse Newman, David, MD as Consulting Physician (General Surgery) Mackey, Heather Thompson, NP as Nurse Practitioner (Hematology and Oncology)  DIAGNOSIS:  Encounter Diagnosis  Name Primary?  . Malignant neoplasm of upper-outer quadrant of right breast in female, estrogen receptor negative (HCC)     SUMMARY OF ONCOLOGIC HISTORY:   Breast cancer of upper-outer quadrant of right female breast (HCC)   01/19/2015 Mammogram    Right breast: density with indistinct margin at 12:00, posterior depth; also oval mass with obscured margin at 11:00, middle depth; irregular mass with indistinct margin central to nipple, middle depth      01/23/2015 Initial Biopsy    Right breast 9:00 and 10:00 biopsy ([facility]): Grade 3 IDC ER/PR negative, HER-2 negative, Ki-67 77% and 73%; the right axillary lymph node biopsy: ER 22% positive      01/30/2015 Breast MRI    Right breast masses 9 and 10:00 position, additional irregular masses with non-mass enhancement upper outer quadrant with one mass extending into upper inner quadrant, multifocal, multicentric 13.7 x 4.6 x 4.6 cm, malignant right axillary lymph node      02/08/2015 Clinical Stage    Stage IIIA: T3, N1, M0      02/09/2015 Procedure    BreastNext panel revealed VUS BRCA2 gene called [id number] (c.[id number]>G); otherwise no clinically significant variant at ATM, BARD1, BRCA1, BRCA2, BRIP1, CDH1, CHEK2, MRE11A, MUTYH, NBN, NF1, PALB2, PTEN, RAD50, RAD51C, RAD51D, and TP53      02/15/2015 Imaging    No evidence of metastatic disease, 2.2 cm enhancing lesion anterior left lower kidney suspicious for solid renal neoplasm      02/20/2015 - 07/10/2015 Neo-Adjuvant Chemotherapy   dose dense doxorubicin and cyclophosphamide x 4 followed by Abraxane weekly x 12      07/10/2015 Breast MRI    Multiple irregular enhancing masses right breast are less confluent and smaller dominant mass 10:00 2.1 x 1.9 x 1.3 cm; right breast 11:00 mass measures 1.2 x 1.2 x 0.8 cm, decreased right axillary lymph node thickening      07/28/2015 Definitive Surgery    Rt mastectomy: Multifocal IDC 2 cm, 1.5 cm. 0.6 cm; 3/13 LN positive, ER/PR HER-2      07/28/2015 Pathologic Stage    Stage IIA: T1c N1 M0        09/03/2015 - 10/13/2015 Radiation Therapy    Adjuvant radiation therapy: Right chest wall/50.4 Gray @ 1.8 Gray per fraction x 28 fractions. Right supraclavicular fossa 45 Gy @1.8 Gy per fraction x 25 fractions      11/15/2015 - 05/10/2016 Chemotherapy    Adjuvant capecitabine - completed 8 cycles      11/16/2015 Survivorship    Survivorship visit completed and copy of care plan given to patient      02/23/2016 Procedure    Left mandible biopsy: Scant material, no malignancy      05/21/2016 -  Anti-estrogen oral therapy    Patient did not take tamoxifen because her ER was weakly positive at 22% and she was worried about toxicities      02/21/2017 Imaging    Rt Axilla 8 X 7 X 7 mm mass inv pectoralis musculature, additional mass 9 mm 1.5 cm apart      02/21/2017 Relapse/Recurrence      Right axillary mass biopsy: 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      03/24/2017 Miscellaneous    Foundation 1 analysis: Microsatellite stable; TMB 9 muts/Mb; MYC amplification, PIK3C2B p717L; TP53 [id number]      03/25/2017 -  Chemotherapy    Carboplatin and gemcitabine palliative chemotherapy      07/28/2017 PET scan    Progression of metastatic disease new liver lesion on 0.6 cm SUV 7.2, previous liver lesion 1.4 cm unchanged but shows no activity; scattered hypermetabolic bone lesions some of which are new, scattered pulmonary nodules stable hypermetabolic right subpectoral and  mediastinal/internal mammary nodes stable       Renal cell carcinoma of left kidney ([place])   01/29/2016 Surgery    Left renal mass partial resection: 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 PET scan showed progression of the liver and bone. She is also complaining of worsening pain in the right shoulder radiating down the arm.  REVIEW OF SYSTEMS:   Constitutional: Denies fevers, chills or abnormal weight loss Eyes: Denies blurriness of vision Ears, nose, mouth, throat, and face: Denies mucositis or sore throat Respiratory: Denies cough, dyspnea or wheezes Cardiovascular: Denies palpitation, chest discomfort Gastrointestinal:  Denies nausea, heartburn or change in bowel habits Skin: Denies abnormal skin rashes Lymphatics: Denies new lymphadenopathy or easy bruising Neurological:pain and numbness in the right shoulder radiating down the arm Behavioral/Psych: Mood is stable, no new changes  Extremities: No lower extremity edema All other systems were reviewed with the patient and are negative.  I have reviewed the past medical history, past surgical history, social history and family history with the patient and they are unchanged from previous note.  ALLERGIES:  is allergic to sulfa antibiotics.  MEDICATIONS:  Current Outpatient Prescriptions  Medication Sig Dispense Refill  . benazepril (LOTENSIN) 10 MG tablet Take 1 tablet (10 mg total) by mouth daily.    . Black Pepper-Turmeric 3-500 MG CAPS Take 1 capsule by mouth daily.    Marland Kitchen dexamethasone (DECADRON) 4 MG tablet Take 1 tablet (4 mg total) by mouth daily. Start the  day after chemotherapy for 2 days. Take with food. (Patient taking differently: Take 4 mg by mouth every 7 (seven) days. Start the day after chemotherapy for 2 days. Take with food.) 30 tablet 1  . dexamethasone (DECADRON) 4 MG tablet Take 4 mg by mouth every 7 (seven) days. wednesday    . Ginger, Zingiber officinalis, (GINGER ROOT PO) Take 1 tablet by mouth daily.    Marland Kitchen HYDROcodone-acetaminophen (NORCO/VICODIN) 5-325 MG tablet Take 1-2 tablets by mouth every 4 (four) hours as needed for moderate pain. (Patient taking differently: Take 1-2 tablets by mouth at bedtime as needed for moderate pain. ) 90 tablet 0  . lidocaine-prilocaine (EMLA) cream Apply to affected area once 30 g 3  . LORazepam (ATIVAN) 0.5 MG tablet Take 1 tablet (0.5 mg total) by mouth every 6 (six) hours as needed (Nausea or vomiting). 30 tablet 0  . Multiple Vitamins-Minerals (MULTIVITAMIN) tablet Take 1 tablet by mouth daily.    . ondansetron (ZOFRAN) 8 MG tablet Take 1 tablet (8 mg  total) by mouth 2 (two) times daily as needed for refractory nausea / vomiting. Start on day 3 after chemotherapy. 30 tablet 1  . Probiotic Product (PROBIOTIC PO) Take 1 tablet by mouth daily.    . prochlorperazine (COMPAZINE) 10 MG tablet Take 1 tablet (10 mg total) by mouth every 6 (six) hours as needed (Nausea or vomiting). 30 tablet 1   No current facility-administered medications for this visit.     PHYSICAL EXAMINATION: ECOG PERFORMANCE STATUS: 1 - Symptomatic but completely ambulatory  Vitals:   07/29/17 1057  BP: (!) 170/88  Pulse: 83  Resp: 20  Temp: 98.2 F (36.8 C)  SpO2: 100%   Filed Weights   07/29/17 1057  Weight: 170 lb 1.6 oz (77.2 kg)    GENERAL:alert, no distress and comfortable SKIN: skin color, texture, turgor are normal, no rashes or significant lesions EYES: normal, Conjunctiva are pink and non-injected, sclera clear OROPHARYNX:no exudate, no erythema and lips, buccal mucosa, and tongue normal  NECK: supple,  thyroid normal size, non-tender, without nodularity LYMPH:  no palpable lymphadenopathy in the cervical, axillary or inguinal LUNGS: clear to auscultation and percussion with normal breathing effort HEART: regular rate & rhythm and no murmurs and no lower extremity edema ABDOMEN:abdomen soft, non-tender and normal bowel sounds MUSCULOSKELETAL:no cyanosis of digits and no clubbing  NEURO: alert & oriented x 3 with fluent speech, no focal motor/sensory deficits EXTREMITIES: No lower extremity edema  LABORATORY DATA:  I have reviewed the data as listed   Chemistry      Component Value Date/Time   NA 141 07/15/2017 0908   K 3.3 (L) 07/15/2017 0908   CL 104 04/23/2016 1551   CO2 23 07/15/2017 0908   BUN 8.0 07/15/2017 0908   CREATININE 0.8 07/15/2017 0908      Component Value Date/Time   CALCIUM 9.9 07/15/2017 0908   ALKPHOS 118 07/15/2017 0908   AST 34 07/15/2017 0908   ALT 69 (H) 07/15/2017 0908   BILITOT 0.40 07/15/2017 0908       Lab Results  Component Value Date   WBC 3.0 (L) 07/29/2017   HGB 9.8 (L) 07/29/2017   HCT 29.0 (L) 07/29/2017   MCV 91.3 07/29/2017   PLT 213 07/29/2017   NEUTROABS 1.9 07/29/2017    ASSESSMENT & PLAN:  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 Rt mastectomy 07/28/15: Multifocal IDC 2 cm, 1.5 cm. 0.6 cm; 3/13 LN positive, T1c N1 M0 stage IIA, ER/PR HER-2 negative. 2. Adjuvant XRT 09/03/15 to 10/13/15 3. Adjuvant Xeloda X 6 months (8 cycles) started 11/15/2015 completed July 2017 4. Current treatment: Carboplatin and gemcitabine started  03/14/2017 ----------------------------------------------------------------------- Rt Axillary LN Biopsy: Triple negative right breast cancer [facility]: Poorly differentiated metastatic adenocarcinoma triple negative  PET/CT 07/28/2017:Progression of metastatic disease new liver lesion on 0.6 cm SUV 7.2, previous liver lesion 1.4 cm unchanged but shows no activity; scattered hypermetabolic bone lesions some of which are new, scattered pulmonary nodules stable hypermetabolic right subpectoral and mediastinal/internal mammary nodes stable  Radiology counseling: because of progression of disease, We discussed changing therapy to [facility]  Plan to start chemotherapy next week  Pain in the right shoulder radiating down the arm: The PET/CT scan shows no abnormal uptake in this region. I will send her to see radiation oncology  table to obtain an appointment tomorrow to see radiation oncology.  I plan to start [place] chemotherapy next Wednesday.  I spent 25 minutes talking to the patient of which more than half was spent in counseling and coordination of care.  No orders of the defined types were placed in this encounter.  The patient has a good understanding of the overall plan. she agrees with it. she will call with any problems that may develop before the next visit here.   Rulon Eisenmenger, MD 07/29/17

## 2017-07-31 ENCOUNTER — Ambulatory Visit
Admission: RE | Admit: 2017-07-31 | Discharge: 2017-07-31 | Disposition: A | Discharge status: Home / Self Care | Payer: 59 | Source: Ambulatory Visit | Attending: Radiation Oncology | Admitting: Radiation Oncology

## 2017-07-31 ENCOUNTER — Encounter: Payer: Self-pay | Admitting: Radiation Oncology

## 2017-07-31 VITALS — BP 134/109 | HR 103 | Temp 98.6°F | Resp 18 | Ht 65.0 in | Wt 165.4 lb

## 2017-07-31 DIAGNOSIS — C50411 Malignant neoplasm of upper-outer quadrant of right female breast: Secondary | ICD-10-CM | POA: Diagnosis not present

## 2017-07-31 DIAGNOSIS — Z9011 Acquired absence of right breast and nipple: Secondary | ICD-10-CM | POA: Insufficient documentation

## 2017-07-31 DIAGNOSIS — Z171 Estrogen receptor negative status [ER-]: Principal | ICD-10-CM

## 2017-07-31 DIAGNOSIS — G629 Polyneuropathy, unspecified: Secondary | ICD-10-CM | POA: Insufficient documentation

## 2017-07-31 DIAGNOSIS — Z853 Personal history of malignant neoplasm of breast: Secondary | ICD-10-CM | POA: Insufficient documentation

## 2017-07-31 DIAGNOSIS — Z905 Acquired absence of kidney: Secondary | ICD-10-CM | POA: Insufficient documentation

## 2017-07-31 DIAGNOSIS — D573 Sickle-cell trait: Secondary | ICD-10-CM | POA: Diagnosis not present

## 2017-07-31 DIAGNOSIS — Z85528 Personal history of other malignant neoplasm of kidney: Secondary | ICD-10-CM | POA: Insufficient documentation

## 2017-07-31 DIAGNOSIS — F329 Major depressive disorder, single episode, unspecified: Secondary | ICD-10-CM | POA: Diagnosis not present

## 2017-07-31 DIAGNOSIS — Z79899 Other long term (current) drug therapy: Secondary | ICD-10-CM | POA: Diagnosis not present

## 2017-07-31 DIAGNOSIS — R0789 Other chest pain: Secondary | ICD-10-CM | POA: Diagnosis not present

## 2017-07-31 DIAGNOSIS — Z882 Allergy status to sulfonamides status: Secondary | ICD-10-CM | POA: Insufficient documentation

## 2017-07-31 DIAGNOSIS — M25511 Pain in right shoulder: Secondary | ICD-10-CM | POA: Insufficient documentation

## 2017-07-31 DIAGNOSIS — Z87891 Personal history of nicotine dependence: Secondary | ICD-10-CM | POA: Diagnosis not present

## 2017-07-31 DIAGNOSIS — Z923 Personal history of irradiation: Secondary | ICD-10-CM | POA: Diagnosis not present

## 2017-07-31 DIAGNOSIS — Z9889 Other specified postprocedural states: Secondary | ICD-10-CM | POA: Insufficient documentation

## 2017-07-31 DIAGNOSIS — Z510 Encounter for antineoplastic radiation therapy: Secondary | ICD-10-CM | POA: Diagnosis not present

## 2017-07-31 MED ORDER — PREGABALIN 75 MG PO CAPS: 75.0000 mg | ORAL_CAPSULE | Freq: Two times a day (BID) | ORAL | 1 refills | Status: DC

## 2017-07-31 NOTE — Addendum Note (Signed)
Encounter addended by: Malena Edman, RN on: 07/31/2017  1:32 PM<BR>    Actions taken: Charge Capture section accepted

## 2017-07-31 NOTE — Progress Notes (Signed)
Radiation Oncology         ([phone]) [phone] ________________________________  Name: Phyllis Pruitt MRN: [id number]  Date: 07/31/2017  DOB: August 22, 1971  AS:[id number], Margaretha Sheffield, MD  Nicholas Lose, MD     REFERRING PHYSICIAN: Nicholas Lose, MD   DIAGNOSIS: The encounter diagnosis was Malignant neoplasm of upper-outer quadrant of right breast in female, estrogen receptor negative ([place]).   HISTORY OF PRESENT ILLNESS: Phyllis Pruitt is a 46 y.o. female seen at the request of Dr. 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 She completed right mastectomy on 07/28/15 which revealed multifocal ICD measuring 2 cm, 1.5 cm, and 57mm. 3/13 nodes were positive, and the tumor was still triple negative. She received adjuvant therapy to the chest wall and supraclavicular fossa with minimal overlap of the axilla. She has been followed in surveillance but developed pain in the right chest wall and right hand, as well as palpable fullness along the right chest wall posterior to her implant. An axillary ultrasound on 02/21/17 which revealed an 8 x 7 x 7 mm hypoechoic mass in the 9 x 7 x 7 mm mass in the chest wall beneath the pectoralis musculature. Lesions were about 1.5 cm apart. The medial right lesion was biopsied on 02/21/2017 revealing metastatic carcinoma assistant with breast primary, and 2 additional nodes were biopsied on 02/27/2017 both involved with metastatic carcinoma again consistent with her known primary breast cancer. A PET scan on 03/03/2017 revealed poorly marginated area of hypermetabolic activity measuring 3.5 x 2.1 cm in the right axillary region consistent with her known tumor, as well as focal hypermetabolic change in the upper right pectoralis muscle with an SUV of 5.5. Mildly enlarged hypermetabolic right internal mammary  nodes were noted within the maxillary SUV of 7.6 and measured up to 1.2 cm. A mildly enlarged hypermetabolic 1 cm right paratracheal lymph node is also noted as well as some new solid pulmonary nodules in the right lung 6 mm in the right upper lobe and 6 mm in the right lower lobe. New disease is also noted in the liver with a 1.2 cm mass in the right lobe, and leg left hypermetabolic region in the scapula with SUV of 6.2 was also noted. No other hypermetabolic osseous lesions were noted. She was gearing up to start radiation over 3 weeks back in June but did go for second opinion at [facility] and was encouraged to move forward with chemo alone rather than radiation. Since that time, she has been on Gemzar/Carboplatin and her most recent PET scan on 07/28/17 revealed a new lesion in the head of her right humerus, persistent though stable nodes in the axilla/mediastinum/internal mammary regions, and new disease in the liver. She was having pain in the right shoulder at the time of reviewing this with Dr. [name], and comes today to discuss palliative radiotherapy.  PREVIOUS RADIATION THERAPY:  09/03/15-10/13/15: Right chest wall/50.4 Pearline Cables @ 1.8 Pearline Cables per fraction x 28 fractions Right supraclavicular fossa 45 Gy @1 .8 Gy per fraction x 25 fractions  PAST MEDICAL HISTORY:  Past Medical History:  Diagnosis Date  . Anemia    during pregnancy  . Breast cancer of upper-outer quadrant of right female breast ([place]) 01/27/2015  . Depression    hx denies any problems now  . History of kidney cancer 12/2015   left removed "spot"  .  Lymphedema    rt arm  . Neuropathy   . Sickle cell trait ([place])        PAST SURGICAL HISTORY: Past Surgical History:  Procedure Laterality Date  . LATISSIMUS FLAP TO BREAST Right 06/03/2016   Procedure: RIGHT LATISSIMUS FLAP TO BREAST;  Surgeon: Irene Limbo, MD;  Location: [place];  Service: Plastics;  Laterality: Right;  . LIPOSUCTION WITH LIPOFILLING Right 09/20/2016    Procedure: LIPOSUCTION WITH LIPOFILLING;  Surgeon: Irene Limbo, MD;  Location: [place];  Service: Plastics;  Laterality: Right;  LIPOSUCTION WITH LIPOFILLING  . MASTECTOMY    . MASTECTOMY MODIFIED RADICAL Right 07/28/2015  . MASTECTOMY MODIFIED RADICAL Right 07/28/2015   Procedure: RIGHT MODIFIED RADICAL MASTECTOMY;  Surgeon: Alphonsa Overall, MD;  Location: [facility];  Service: General;  Laterality: Right;  . PORT-A-CATH REMOVAL  07/28/2015  . PORT-A-CATH REMOVAL  07/28/2015   Procedure: REMOVAL PORT-A-CATH;  Surgeon: Alphonsa Overall, MD;  Location: [facility];  Service: General;;  . PORTACATH PLACEMENT N/A 02/10/2015   Procedure: INSERTION PORT-A-CATH WITH ULTRA SOUND, left subclavian,;  Surgeon: Alphonsa Overall, MD;  Location: [facility];  Service: General;  Laterality: N/A;  . PORTACATH PLACEMENT N/A 04/04/2017   Procedure: POWER PORT PLACEMENT;  Surgeon: Alphonsa Overall, MD;  Location: [facility];  Service: General;  Laterality: N/A;  . REMOVAL OF TISSUE EXPANDER AND PLACEMENT OF IMPLANT Right 09/20/2016   Procedure: REMOVAL OF RIGHT TISSUE EXPANDER WITH PLACEMENT OF RIGHT SILICONE BREAST IMPLANTS LIPOFILLING FROM ABDOMEN TO RIGHT CHEST;  Surgeon: Irene Limbo, MD;  Location: [place];  Service: Plastics;  Laterality: Right;  PLACEMENT OF RIGHT SILICONE BREAST IMPLANTS LIPOFILLING FROM ABDOMEN TO RIGHT CHEST  . ROBOTIC ASSITED PARTIAL NEPHRECTOMY Left 12/29/2015   Procedure: XI ROBOTIC ASSITED PARTIAL NEPHRECTOMY;  Surgeon: Cleon Gustin, MD;  Location: [facility];  Service: Urology;  Laterality: Left;  . TISSUE EXPANDER PLACEMENT Right 06/03/2016   Procedure: PLACEMENT RIGHT TISSUE EXPANDER;  Surgeon: Irene Limbo, MD;  Location: [facility];  Service: Plastics;  Laterality: Right;  . TONSILLECTOMY    . TUBAL LIGATION    . wisdome teeth extraction       FAMILY HISTORY: No family history on file.   SOCIAL HISTORY:  reports that she has never smoked. She has never used  smokeless tobacco. She reports that she does not drink alcohol or use drugs. The patient is married and lives in [place].   ALLERGIES: Sulfa antibiotics   MEDICATIONS:  Current Outpatient Prescriptions  Medication Sig Dispense Refill  . benazepril (LOTENSIN) 10 MG tablet Take 1 tablet (10 mg total) by mouth daily.    . Black Pepper-Turmeric 3-500 MG CAPS Take 1 capsule by mouth daily.    Marland Kitchen dexamethasone (DECADRON) 4 MG tablet Take 1 tablet (4 mg total) by mouth daily. Start the day after chemotherapy for 2 days. Take with food. (Patient taking differently: Take 4 mg by mouth every 7 (seven) days. Start the day after chemotherapy for 2 days. Take with food.) 30 tablet 1  . Ginger, Zingiber officinalis, (GINGER ROOT PO) Take 1 tablet by mouth daily.    Marland Kitchen lidocaine-prilocaine (EMLA) cream Apply to affected area once 30 g 3  . Multiple Vitamins-Minerals (MULTIVITAMIN) tablet Take 1 tablet by mouth daily.    . Probiotic Product (PROBIOTIC PO) Take 1 tablet by mouth daily.    Marland Kitchen dexamethasone (DECADRON) 4 MG tablet Take 4 mg by mouth every 7 (seven) days. wednesday    . HYDROcodone-acetaminophen (NORCO/VICODIN)  5-325 MG tablet Take 1-2 tablets by mouth every 4 (four) hours as needed for moderate pain. (Patient not taking: Reported on 07/31/2017) 90 tablet 0  . LORazepam (ATIVAN) 0.5 MG tablet Take 1 tablet (0.5 mg total) by mouth every 6 (six) hours as needed (Nausea or vomiting). (Patient not taking: Reported on 07/31/2017) 30 tablet 0  . ondansetron (ZOFRAN) 8 MG tablet Take 1 tablet (8 mg total) by mouth 2 (two) times daily as needed for refractory nausea / vomiting. Start on day 3 after chemotherapy. (Patient not taking: Reported on 07/31/2017) 30 tablet 1  . pregabalin (LYRICA) 75 MG capsule Take 1 capsule (75 mg total) by mouth 2 (two) times daily. 60 capsule 1  . prochlorperazine (COMPAZINE) 10 MG tablet Take 1 tablet (10 mg total) by mouth every 6 (six) hours as needed (Nausea or vomiting).  (Patient not taking: Reported on 07/31/2017) 30 tablet 1   No current facility-administered medications for this encounter.      REVIEW OF SYSTEMS: On review of systems, the patient reports that she is doing well overall but continues to have constant pain in her right shoulder that has been present for about 2 weeks. She has tried to avoid narcotic pain medication due to constipation. She is needing a refill of Lyrica for the numbness she's had in her first third digits in the right hand but also has noted in the last month numbness and pain in her fourth and fifth digit. She continues to have edema in her right upper extremity. She denies any chest pain, shortness of breath, cough, fevers, chills, night sweats, unintended weight changes. She denies any bowel or bladder disturbances, and denies abdominal pain, nausea or vomiting. She denies any new additional musculoskeletal or joint aches or pains, new skin lesions or concerns. A complete review of systems is obtained and is otherwise negative.     PHYSICAL EXAM:  Wt Readings from Last 3 Encounters:  07/31/17 165 lb 6.4 oz (75 kg)  07/29/17 170 lb 1.6 oz (77.2 kg)  07/28/17 171 lb 4.8 oz (77.7 kg)   Temp Readings from Last 3 Encounters:  07/31/17 98.6 F (37 C) (Oral)  07/29/17 98.2 F (36.8 C) (Oral)  07/28/17 97.7 F (36.5 C) (Oral)   BP Readings from Last 3 Encounters:  07/31/17 (!) 134/109  07/29/17 (!) 170/88  07/28/17 (!) 148/73   Pulse Readings from Last 3 Encounters:  07/31/17 (!) 103  07/29/17 83  07/28/17 78   Pain Assessment Pain Score:  (standing 5 lb. weight loss)/10  In general this is a well appearing African-American female  in no acute distress. She is alert and oriented x4 and appropriate throughout the examination. HEENT reveals that the patient is normocephalic, atraumatic. EOMs are intact. PERRLA. Skin is intact without any evidence of gross lesions. Cardiopulmonary assessment is negative for acute distress  and she exhibits normal effort. There is trace pitting edema of the right upper extremity consistent with lymphedema and this is unchanged from her prior visit. She does have pain with external rotation and with lifting her right arm past 90 degrees.    ECOG = 1  0 - Asymptomatic (Fully active, able to carry on all predisease activities without restriction)  1 - Symptomatic but completely ambulatory (Restricted in physically strenuous activity but ambulatory and able to carry out work of a light or sedentary nature. For example, light housework, office work)  2 - Symptomatic, <50% in bed during the day (Ambulatory and capable of  all self care but unable to carry out any work activities. Up and about more than 50% of waking hours)  3 - Symptomatic, >50% in bed, but not bedbound (Capable of only limited self-care, confined to bed or chair 50% or more of waking hours)  4 - Bedbound (Completely disabled. Cannot carry on any self-care. Totally confined to bed or chair)  5 - Death   Eustace Pen MM, Creech RH, Tormey DC, et al. [phone]). "Toxicity and response criteria of the [facility]". [place] Oncol. 5 (6): 649-55    LABORATORY DATA:  Lab Results  Component Value Date   WBC 3.0 (L) 07/29/2017   HGB 9.8 (L) 07/29/2017   HCT 29.0 (L) 07/29/2017   MCV 91.3 07/29/2017   PLT 213 07/29/2017   Lab Results  Component Value Date   NA 143 07/29/2017   K 3.3 (L) 07/29/2017   CL 104 04/23/2016   CO2 26 07/29/2017   Lab Results  Component Value Date   ALT 60 (H) 07/29/2017   AST 37 (H) 07/29/2017   ALKPHOS 127 07/29/2017   BILITOT 0.51 07/29/2017      RADIOGRAPHY: Nm Pet Image Restag (ps) Skull Base To Thigh  Result Date: 07/28/2017 CLINICAL DATA:  Subsequent treatment strategy for right breast cancer. EXAM: NUCLEAR MEDICINE PET SKULL BASE TO THIGH TECHNIQUE: 8.6 mCi F-18 FDG was injected intravenously. Full-ring PET imaging was performed from the skull base to  thigh after the radiotracer. CT data was obtained and used for attenuation correction and anatomic localization. FASTING BLOOD GLUCOSE:  Value: 93 mg/dl COMPARISON:  CT chest abdomen pelvis 05/26/2017 and PET 03/03/2017. FINDINGS: NECK: No hypermetabolic lymph nodes in the neck. CT images show no acute findings. CHEST: Metabolic right internal mammary chain lymph nodes measure up to 11 mm (CT image 59) with an SUV max of 6.2, as before. Hypermetabolic right paratracheal lymph nodes measure up to 10 mm (CT image 52) with SUV max of 4.0, slightly decreased in FDG uptake, previously 6.5. Pectoral/subpectoral irregular soft tissue on the right measures approximately 2.1 x 3.2 cm, as before, with an SUV max of 6.6, previously 8.7. No hypermetabolic hilar or left axillary lymph nodes. No hypermetabolic pulmonary nodules. Left-sided Port-A-Cath tip terminates in the SVC. No pericardial or pleural effusion. There are multiple scattered small pulmonary nodules, measuring up to 5 mm in the apical segment right upper lobe, stable but too small for PET resolution. ABDOMEN/PELVIS: 1.4 cm low-attenuation lesion in the dome of the liver is unchanged in size and no longer shows abnormal hypermetabolism. A new hypoattenuating lesion in segment 4 measures 1.6 cm (CT image 83) with SUV max of 7.2. No abnormal hypermetabolism in the adrenal glands, spleen or pancreas. Liver, gallbladder, adrenal glands and right kidney are unremarkable. Low-attenuation lesion in the left kidney measures 1.7 cm, difficult to further characterize without post-contrast imaging. Spleen, pancreas, stomach and bowel are grossly unremarkable. SKELETON: There are scattered hypermetabolic osseous lesions, some of which are new. A new lucent lesion in the lateral aspect of the right humeral head has surrounding sclerosis (CT image 36) with an SUV max of 6.3. IMPRESSION: 1. Overall progression of metabolically active metastatic disease, as evidenced by a new  liver lesion and new osseous lesions. 2. Hypermetabolic right pectoral/subpectoral soft tissue and mediastinal/internal mammary lymph nodes are grossly stable. 3. Scattered small pulmonary nodules, stable. Continued attention on followup exams is warranted. Electronically Signed   By: Lorin Picket M.D.   On: 07/28/2017  13:34       IMPRESSION/PLAN: 1. 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 We reviewed her course since her last visit and she did not proceed with treatment at the recommendations of [facility]. She has become more symptomatic however and Dr. Lisbeth Renshaw discusses the utility of radiotherapy in her case. We did discuss the delivery and logistics and in order to treat but also avoid untoward toxicity of the brachial plexus and again as such, he recommends a course of 3 weeks of palliative radiotherapy to the right axillary and mammary nodes as well as the head of the right humerus. The patient is in agreement with like to begin. She is going to simulate today. Written consent is obtained and placed in the chart, a copy was provided to the patient. 2. Pain secondary to #1. She is given a new prescription for Lyrica and will start at 75 mg daily then go to BID dosing as well.   In a visit lasting 25 minutes, greater than 50% of the time was spent face to face discussing her case and options with radiation, and coordinating the patient's care.  The above documentation reflects my direct findings during this shared patient visit. Please see the separate note by Dr. Lisbeth Renshaw on this date for the remainder of the patient's plan of care.    Carola Rhine, PAC

## 2017-08-04 ENCOUNTER — Telehealth: Payer: Self-pay | Admitting: *Deleted

## 2017-08-04 NOTE — Telephone Encounter (Signed)
Received call from patient that she has decided to go to the [facility] in [place].  She would like to cancel her appointments for 10/26 and XRT.  Gave her the number for XRT.  Request sent to have her records and imaging sent there.

## 2017-08-06 ENCOUNTER — Ambulatory Visit: Payer: 59 | Admitting: Radiation Oncology

## 2017-08-06 ENCOUNTER — Telehealth: Payer: Self-pay | Admitting: Hematology and Oncology

## 2017-08-06 NOTE — Telephone Encounter (Signed)
FAXED RECORDS TO [facility] RELEASE ID [id number]

## 2017-08-07 ENCOUNTER — Ambulatory Visit: Payer: 59

## 2017-08-08 ENCOUNTER — Ambulatory Visit: Payer: 59

## 2017-08-08 ENCOUNTER — Telehealth: Payer: Self-pay | Admitting: *Deleted

## 2017-08-08 ENCOUNTER — Ambulatory Visit: Payer: 59 | Admitting: Adult Health

## 2017-08-08 ENCOUNTER — Other Ambulatory Visit: Payer: 59

## 2017-08-08 NOTE — Telephone Encounter (Signed)
On 08-08-17 fax medical records [facility], it was consult note, end of tx notes, follow up notes, and dosimetrist will do there part

## 2017-08-11 ENCOUNTER — Ambulatory Visit: Payer: 59

## 2017-08-12 ENCOUNTER — Ambulatory Visit: Payer: 59

## 2017-08-13 ENCOUNTER — Ambulatory Visit: Payer: 59

## 2017-08-14 ENCOUNTER — Telehealth: Payer: Self-pay | Admitting: *Deleted

## 2017-08-14 ENCOUNTER — Ambulatory Visit: Payer: 59

## 2017-08-14 ENCOUNTER — Encounter: Payer: Self-pay | Admitting: Radiation Oncology

## 2017-08-14 NOTE — Telephone Encounter (Signed)
patient called requesting to start radiation here  After all , and to start as  Soon as possible, trasnferred call to Anise Salvo 2:56 PM

## 2017-08-15 ENCOUNTER — Telehealth: Payer: Self-pay | Admitting: *Deleted

## 2017-08-15 ENCOUNTER — Ambulatory Visit: Payer: 59

## 2017-08-15 NOTE — Telephone Encounter (Signed)
Received notification pt would like to come back to [facility] for tx. Spoke to pt, scheduled and confirmed appt with Dr. [name] on 11/6.

## 2017-08-18 ENCOUNTER — Ambulatory Visit: Payer: 59

## 2017-08-19 ENCOUNTER — Ambulatory Visit (HOSPITAL_BASED_OUTPATIENT_CLINIC_OR_DEPARTMENT_OTHER): Payer: 59 | Admitting: Hematology and Oncology

## 2017-08-19 ENCOUNTER — Telehealth: Payer: Self-pay | Admitting: Hematology and Oncology

## 2017-08-19 ENCOUNTER — Ambulatory Visit: Payer: 59

## 2017-08-19 DIAGNOSIS — C787 Secondary malignant neoplasm of liver and intrahepatic bile duct: Secondary | ICD-10-CM | POA: Diagnosis not present

## 2017-08-19 DIAGNOSIS — C773 Secondary and unspecified malignant neoplasm of axilla and upper limb lymph nodes: Secondary | ICD-10-CM | POA: Diagnosis not present

## 2017-08-19 DIAGNOSIS — C642 Malignant neoplasm of left kidney, except renal pelvis: Secondary | ICD-10-CM

## 2017-08-19 DIAGNOSIS — C50411 Malignant neoplasm of upper-outer quadrant of right female breast: Secondary | ICD-10-CM

## 2017-08-19 DIAGNOSIS — C7951 Secondary malignant neoplasm of bone: Secondary | ICD-10-CM

## 2017-08-19 DIAGNOSIS — Z171 Estrogen receptor negative status [ER-]: Secondary | ICD-10-CM

## 2017-08-19 DIAGNOSIS — G893 Neoplasm related pain (acute) (chronic): Secondary | ICD-10-CM | POA: Diagnosis not present

## 2017-08-19 DIAGNOSIS — M25511 Pain in right shoulder: Secondary | ICD-10-CM

## 2017-08-19 MED ORDER — OXYCODONE HCL 5 MG PO TABS: 5.0000 mg | ORAL_TABLET | ORAL | 0 refills | Status: DC | PRN

## 2017-08-19 MED ORDER — CAPECITABINE 500 MG PO TABS: 1000.0000 mg | ORAL_TABLET | Freq: Two times a day (BID) | ORAL | 0 refills | Status: DC

## 2017-08-19 NOTE — Telephone Encounter (Signed)
Gave patient AVS and calendar of upcoming November through march appointments.

## 2017-08-19 NOTE — Progress Notes (Signed)
 Patient Care Team: Griffin, Elaine, MD as PCP - General (Family Medicine) Magrinat, Gustav C, MD as Consulting Physician (Oncology) Wentworth, Stacy, MD (Inactive) as Consulting Physician (Radiation Oncology) Stuart, Dawn C, RN as Registered Nurse Martini, Keisha N, RN as Registered Nurse Newman, David, MD as Consulting Physician (General Surgery) Mackey, Heather Thompson, NP as Nurse Practitioner (Hematology and Oncology)  DIAGNOSIS:  Encounter Diagnosis  Name Primary?  . Malignant neoplasm of upper-outer quadrant of right breast in female, estrogen receptor negative (HCC)     SUMMARY OF ONCOLOGIC HISTORY:   Breast cancer of upper-outer quadrant of right female breast (HCC)   01/19/2015 Mammogram    Right breast: density with indistinct margin at 12:00, posterior depth; also oval mass with obscured margin at 11:00, middle depth; irregular mass with indistinct margin central to nipple, middle depth      01/23/2015 Initial Biopsy    Right breast 9:00 and 10:00 biopsy ([facility]): Grade 3 IDC ER/PR negative, HER-2 negative, Ki-67 77% and 73%; the right axillary lymph node biopsy: ER 22% positive      01/30/2015 Breast MRI    Right breast masses 9 and 10:00 position, additional irregular masses with non-mass enhancement upper outer quadrant with one mass extending into upper inner quadrant, multifocal, multicentric 13.7 x 4.6 x 4.6 cm, malignant right axillary lymph node      02/08/2015 Clinical Stage    Stage IIIA: T3, N1, M0      02/09/2015 Procedure    BreastNext panel revealed VUS BRCA2 gene called [id number] (c.[id number]>G); otherwise no clinically significant variant at ATM, BARD1, BRCA1, BRCA2, BRIP1, CDH1, CHEK2, MRE11A, MUTYH, NBN, NF1, PALB2, PTEN, RAD50, RAD51C, RAD51D, and TP53      02/15/2015 Imaging    No evidence of metastatic disease, 2.2 cm enhancing lesion anterior left lower kidney suspicious for solid renal neoplasm      02/20/2015 - 07/10/2015 Neo-Adjuvant Chemotherapy   dose dense doxorubicin and cyclophosphamide x 4 followed by Abraxane weekly x 12      07/10/2015 Breast MRI    Multiple irregular enhancing masses right breast are less confluent and smaller dominant mass 10:00 2.1 x 1.9 x 1.3 cm; right breast 11:00 mass measures 1.2 x 1.2 x 0.8 cm, decreased right axillary lymph node thickening      07/28/2015 Definitive Surgery    Rt mastectomy: Multifocal IDC 2 cm, 1.5 cm. 0.6 cm; 3/13 LN positive, ER/PR HER-2      07/28/2015 Pathologic Stage    Stage IIA: T1c N1 M0        09/03/2015 - 10/13/2015 Radiation Therapy    Adjuvant radiation therapy: Right chest wall/50.4 Gray @ 1.8 Gray per fraction x 28 fractions. Right supraclavicular fossa 45 Gy @1.8 Gy per fraction x 25 fractions      11/15/2015 - 05/10/2016 Chemotherapy    Adjuvant capecitabine - completed 8 cycles      11/16/2015 Survivorship    Survivorship visit completed and copy of care plan given to patient      02/23/2016 Procedure    Left mandible biopsy: Scant material, no malignancy      05/21/2016 -  Anti-estrogen oral therapy    Patient did not take tamoxifen because her ER was weakly positive at 22% and she was worried about toxicities      02/21/2017 Imaging    Rt Axilla 8 X 7 X 7 mm mass inv pectoralis musculature, additional mass 9 mm 1.5 cm apart      02/21/2017 Relapse/Recurrence      Right axillary mass biopsy: 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      03/24/2017 Miscellaneous    Foundation 1 analysis: Microsatellite stable; TMB 9 muts/Mb; MYC amplification, PIK3C2B p717L; TP53 [id number]      03/25/2017 -  Chemotherapy    Carboplatin and gemcitabine palliative chemotherapy      07/28/2017 PET scan    Progression of metastatic disease new liver lesion on 0.6 cm SUV 7.2, previous liver lesion 1.4 cm unchanged but shows no activity; scattered hypermetabolic bone lesions some of which are new, scattered pulmonary nodules stable hypermetabolic right subpectoral and  mediastinal/internal mammary nodes stable       Renal cell carcinoma of left kidney ([place])   01/29/2016 Surgery    Left renal mass partial resection: 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  The recommended to a molecular testing with foundation 1.  It appears that they did not realize that we have already done that previously.  She is having lots of pain in the right shoulder and is going to see Dr. Lisbeth Renshaw tomorrow for palliative radiation.  REVIEW OF SYSTEMS:   Constitutional: Denies fevers, chills or abnormal weight loss Eyes: Denies blurriness of vision Ears, nose, mouth, throat, and face: Denies mucositis or sore throat Respiratory: Denies cough, dyspnea or wheezes Cardiovascular: Denies palpitation, chest discomfort Gastrointestinal:  Denies nausea, heartburn or change in bowel habits Skin: Denies abnormal skin rashes Lymphatics: Denies new lymphadenopathy or easy bruising Neurological:Denies numbness, tingling or new weaknesses Behavioral/Psych: Mood is stable, no new changes  Extremities: Severe pain and numbness in the right arm  All other systems were reviewed with the patient and are negative.  I have reviewed the past medical history, past surgical history, social history and family history with the patient and they are unchanged from previous note.  ALLERGIES:  is allergic to sulfa antibiotics.  MEDICATIONS:  Current Outpatient Medications  Medication Sig Dispense Refill  . benazepril (LOTENSIN) 10 MG tablet Take 1 tablet (10 mg total) by mouth daily.    . Black Pepper-Turmeric 3-500 MG CAPS Take 1 capsule by mouth daily.    . capecitabine  (XELODA) 500 MG tablet Take 2 tablets (1,000 mg total) 2 (two) times daily after a meal for 15 days by mouth. On days of radiation 60 tablet 0  . Ginger, Zingiber officinalis, (GINGER ROOT PO) Take 1 tablet by mouth daily.    Marland Kitchen lidocaine-prilocaine (EMLA) cream Apply to affected area once 30 g 3  . Multiple Vitamins-Minerals (MULTIVITAMIN) tablet Take 1 tablet by mouth daily.    . ondansetron (ZOFRAN) 8 MG tablet Take 1 tablet (8 mg total) by mouth 2 (two) times daily as needed for refractory nausea / vomiting. Start on day 3 after chemotherapy. (Patient not taking: Reported on 07/31/2017) 30 tablet 1  . oxyCODONE (OXY IR/ROXICODONE) 5 MG immediate release tablet Take 1 tablet (5 mg total) every 4 (four) hours as needed by mouth for severe pain. 90 tablet 0  . pregabalin (LYRICA) 75 MG capsule Take 1 capsule (75 mg total) by mouth 2 (two) times daily. 60 capsule 1  . Probiotic Product (PROBIOTIC PO) Take 1 tablet by mouth daily.  No current facility-administered medications for this visit.     PHYSICAL EXAMINATION: ECOG PERFORMANCE STATUS: 1 - Symptomatic but completely ambulatory  Vitals:   08/19/17 1452  BP: (!) 142/95  Pulse: 93  Resp: 18  Temp: 98.2 F (36.8 C)  SpO2: 100%   Filed Weights   08/19/17 1452  Weight: 163 lb (73.9 kg)    GENERAL:alert, no distress and comfortable SKIN: skin color, texture, turgor are normal, no rashes or significant lesions EYES: normal, Conjunctiva are pink and non-injected, sclera clear OROPHARYNX:no exudate, no erythema and lips, buccal mucosa, and tongue normal  NECK: supple, thyroid normal size, non-tender, without nodularity LYMPH:  no palpable lymphadenopathy in the cervical, axillary or inguinal LUNGS: clear to auscultation and percussion with normal breathing effort HEART: regular rate & rhythm and no murmurs and no lower extremity edema ABDOMEN:abdomen soft, non-tender and normal bowel sounds MUSCULOSKELETAL:no cyanosis of digits and  no clubbing  NEURO: alert & oriented x 3 with fluent speech, no focal motor/sensory deficits EXTREMITIES: No lower extremity edema  LABORATORY DATA:  I have reviewed the data as listed   Chemistry      Component Value Date/Time   NA 143 07/29/2017 1026   K 3.3 (L) 07/29/2017 1026   CL 104 04/23/2016 1551   CO2 26 07/29/2017 1026   BUN 5.3 (L) 07/29/2017 1026   CREATININE 0.8 07/29/2017 1026      Component Value Date/Time   CALCIUM 9.4 07/29/2017 1026   ALKPHOS 127 07/29/2017 1026   AST 37 (H) 07/29/2017 1026   ALT 60 (H) 07/29/2017 1026   BILITOT 0.51 07/29/2017 1026       Lab Results  Component Value Date   WBC 3.0 (L) 07/29/2017   HGB 9.8 (L) 07/29/2017   HCT 29.0 (L) 07/29/2017   MCV 91.3 07/29/2017   PLT 213 07/29/2017   NEUTROABS 1.9 07/29/2017    ASSESSMENT & PLAN:  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 Rt mastectomy 07/28/15: Multifocal IDC 2 cm, 1.5 cm. 0.6 cm; 3/13 LN positive, T1c N1 M0 stage IIA, ER/PR HER-2 negative. 2. Adjuvant XRT 09/03/15 to 10/13/15 3. Adjuvant Xeloda X 6 months (8 cycles) started 11/15/2015 completed July 2017 4. Current treatment: Carboplatin and gemcitabine started 03/14/2017 ----------------------------------------------------------------------- Rt Axillary LN Biopsy: Triple negative right breast cancer [facility] at [facility]: Poorly differentiated metastatic adenocarcinoma triple negative  PET/CT 07/28/2017:Progression of metastatic disease new liver lesion on 0.6 cm SUV 7.2, previous liver lesion 1.4 cm unchanged but shows no activity;  scattered hypermetabolic bone lesions some of which are new, scattered pulmonary nodules stable hypermetabolic right subpectoral and mediastinal/internal mammary nodes stable  Severe right brachial plexus pain: Patient is seen Dr. Lisbeth Renshaw tomorrow to start palliative radiation.  I recommended oral Xeloda concurrently with radiation. I sent a prescription for oxycodone  In 3 weeks she will start palliative systemic chemotherapy with [place].  Patient has a plan to go to [place] during Thanksgiving time.   I spent 25 minutes talking to the patient of which more than half was spent in counseling and coordination of care.  No orders  of the defined types were placed in this encounter.  The patient has a good understanding of the overall plan. she agrees with it. she will call with any problems that may develop before the next visit here.   Rulon Eisenmenger, MD 08/19/17

## 2017-08-19 NOTE — Assessment & Plan Note (Signed)
Right breast invasive ductal carcinoma multifocal, multicentric disease with at least 8 tumors, 2.3 cm, 1.6 cm of the biggest tumors in addition 6 more tumors 1 cm or less spanning 13.7 cm, right axillary lymph node biopsy positive (ER 22%); primary tumor is ER 0%, PR 0%, HER-2 negative, Ki-67 77% and 73% respectively, grade 3 T3 N1 M0 equals stage IIIa, Grade 3 1. Rt mastectomy 07/28/15: Multifocal IDC 2 cm, 1.5 cm. 0.6 cm; 3/13 LN positive, T1c N1 M0 stage IIA, ER/PR HER-2 negative. 2. Adjuvant XRT 09/03/15 to 10/13/15 3. Adjuvant Xeloda X 6 months (8 cycles) started 11/15/2015 completed July 2017 4. Current treatment: Carboplatin and gemcitabine started 03/14/2017 ----------------------------------------------------------------------- Rt Axillary LN Biopsy: Triple negative right breast cancer [facility]: Poorly differentiated metastatic adenocarcinoma triple negative  PET/CT 07/28/2017:Progression of metastatic disease new liver lesion on 0.6 cm SUV 7.2, previous liver lesion 1.4 cm unchanged but shows no activity; scattered hypermetabolic bone lesions some of which are new, scattered pulmonary nodules stable hypermetabolic right subpectoral and mediastinal/internal mammary nodes stable  Severe right brachial plexus pain: Patient is seen Dr. Lisbeth Renshaw tomorrow to start palliative radiation.  I recommended oral Xeloda concurrently with radiation.  In 3 weeks she will start palliative systemic chemotherapy with [place].  Patient has a plan to go to [place] during Thanksgiving time.

## 2017-08-20 ENCOUNTER — Ambulatory Visit: Payer: 59

## 2017-08-20 ENCOUNTER — Telehealth: Payer: Self-pay | Admitting: Pharmacist

## 2017-08-20 ENCOUNTER — Ambulatory Visit
Admission: RE | Admit: 2017-08-20 | Discharge: 2017-08-20 | Disposition: A | Discharge status: Home / Self Care | Payer: 59 | Source: Ambulatory Visit | Attending: Radiation Oncology | Admitting: Radiation Oncology

## 2017-08-20 DIAGNOSIS — Z171 Estrogen receptor negative status [ER-]: Principal | ICD-10-CM

## 2017-08-20 DIAGNOSIS — Z510 Encounter for antineoplastic radiation therapy: Secondary | ICD-10-CM | POA: Diagnosis not present

## 2017-08-20 DIAGNOSIS — C50411 Malignant neoplasm of upper-outer quadrant of right female breast: Secondary | ICD-10-CM

## 2017-08-20 DIAGNOSIS — I8290 Acute embolism and thrombosis of unspecified vein: Secondary | ICD-10-CM

## 2017-08-20 MED ORDER — CAPECITABINE 500 MG PO TABS: ORAL_TABLET | ORAL | 0 refills | Status: DC

## 2017-08-20 NOTE — Telephone Encounter (Signed)
Oral Oncology Pharmacist Encounter  Received new prescription for Xeloda (capecitabine) for the treatment of triple negative, metastatic breast cancer in conjunction with radiation, planned duration 15 treatment days.  Labs from 07/29/17 assessed, OK for treatment.  Current medication list in Epic reviewed, no DDIs with Xeloda identified.  Prescription has been e-scribed to [facility] (ph: [phone]) for benefits analysis and approval per insurance requirement.  I LVM for patient with offer on medication update and initial counseling.  Oral Oncology Clinic will continue to follow for insurance authorization, copayment issues, initial counseling and start date.  Johny Drilling, PharmD, BCPS, BCOP 08/20/2017 1:45 PM Oral Oncology Clinic [phone]

## 2017-08-20 NOTE — Telephone Encounter (Signed)
Oral Chemotherapy Pharmacist Encounter   I spoke with patient for overview of new oral chemotherapy medication: Xeloda.   Pt is doing well. Counseled patient on administration, dosing, side effects, monitoring, drug-food interactions, safe handling, storage, and disposal.  Patient will take Xeloda 500mg  tablets, 2 tablets (1000mg ) by mouth in AM and 2 tabs (1000mg ) by mouth in PM, within 30 minutes of finishing meals, on days of radiation only. Xeloda and radiation start date: 08/21/17  Side effects include but not limited to: fatigue, decreased blood counts, GI upset, diarrhea, and hand-foot syndrome. Patient has loperamide at home and will call the office if diarrhea develops.    Reviewed with patient importance of keeping a medication schedule and plan for any missed doses.  Phyllis Pruitt voiced understanding and appreciation.   All questions answered.  Patient updated about [facility] as dispensing pharmacy and provided phone number.  Medication reconciliation performed and medication/allergy list updated.  Patient knows to call the office with questions or concerns. Oral Oncology Clinic will continue to follow.  Thank you,  Johny Drilling, PharmD, BCPS, BCOP 08/20/2017  3:58 PM Oral Oncology Clinic [phone]

## 2017-08-21 ENCOUNTER — Ambulatory Visit: Payer: 59

## 2017-08-21 ENCOUNTER — Ambulatory Visit
Admission: RE | Admit: 2017-08-21 | Discharge: 2017-08-21 | Disposition: A | Discharge status: Home / Self Care | Payer: 59 | Source: Ambulatory Visit | Attending: Radiation Oncology | Admitting: Radiation Oncology

## 2017-08-21 DIAGNOSIS — Z510 Encounter for antineoplastic radiation therapy: Secondary | ICD-10-CM | POA: Diagnosis not present

## 2017-08-21 MED ORDER — CAPECITABINE 500 MG PO TABS: ORAL_TABLET | ORAL | 0 refills | Status: DC

## 2017-08-21 NOTE — Telephone Encounter (Signed)
Oral Oncology Pharmacist Encounter  Received notification from [facility] that patient's pharmacy benefits are now handled through [facility] and Xeloda prescription will need to be filled at [facility] per insurance requirement.  Xeloda prescription e-scribed to [facility] (ph: [phone]). I called and updated patient about change in dispensing pharmacy.  Patient expressed understanding and appreciation. She knows to call the office with questions or concerns.  Oral Oncology Clinic will continue to follow.  Johny Drilling, PharmD, BCPS, BCOP 08/21/2017 1:44 PM Oral Oncology Clinic [phone]

## 2017-08-22 ENCOUNTER — Ambulatory Visit
Admission: RE | Admit: 2017-08-22 | Discharge: 2017-08-22 | Disposition: A | Discharge status: Home / Self Care | Payer: 59 | Source: Ambulatory Visit | Attending: Radiation Oncology | Admitting: Radiation Oncology

## 2017-08-22 ENCOUNTER — Ambulatory Visit: Payer: 59

## 2017-08-22 ENCOUNTER — Encounter: Payer: Self-pay | Admitting: Hematology and Oncology

## 2017-08-22 DIAGNOSIS — C50411 Malignant neoplasm of upper-outer quadrant of right female breast: Secondary | ICD-10-CM

## 2017-08-22 DIAGNOSIS — Z510 Encounter for antineoplastic radiation therapy: Secondary | ICD-10-CM | POA: Diagnosis not present

## 2017-08-22 MED ORDER — ALRA NON-METALLIC DEODORANT (RAD-ONC)
1.0000 "application " | Freq: Once | TOPICAL | Status: AC
  Administered 2017-08-22: 1 via TOPICAL

## 2017-08-22 MED ORDER — RADIAPLEXRX EX GEL
Freq: Once | CUTANEOUS | Status: AC
Start: 2017-08-22 — End: 2017-08-22
  Administered 2017-08-22: 13:00:00 via TOPICAL

## 2017-08-22 NOTE — Progress Notes (Signed)
Office notes faxed to [facility] [phone]) confirmation received

## 2017-08-22 NOTE — Telephone Encounter (Signed)
Oral Oncology Pharmacist Encounter  Spoke with patient today who had questions about medication interactions. Patient stated she was started on Xarelto while at [facility] due to VTE. Xarelto not on patient's current medication list. Med list in Epic updated, no DDIs with Xeloda. Patient instructed to stop ginger root supplement due to increased risk of bleeding while on therapy with Xarelto, patient agreeable.  All questions answered. Radiatio  Started 08/21/17. [facility] has contacted patient and her Xeloda shipment will be delivered to her home tomorrow.  Patient knows to call the office with any questions or concerns.  Oral Oncology Clinic will continue to follow.  Johny Drilling, PharmD, BCPS, BCOP 08/22/2017 2:43 PM Oral Oncology Clinic [phone]

## 2017-08-25 ENCOUNTER — Ambulatory Visit
Admission: RE | Admit: 2017-08-25 | Discharge: 2017-08-25 | Disposition: A | Discharge status: Home / Self Care | Payer: 59 | Source: Ambulatory Visit | Attending: Radiation Oncology | Admitting: Radiation Oncology

## 2017-08-25 ENCOUNTER — Encounter (HOSPITAL_COMMUNITY): Payer: Self-pay

## 2017-08-25 ENCOUNTER — Ambulatory Visit: Payer: 59

## 2017-08-25 DIAGNOSIS — Z510 Encounter for antineoplastic radiation therapy: Secondary | ICD-10-CM | POA: Diagnosis not present

## 2017-08-26 ENCOUNTER — Ambulatory Visit: Payer: 59

## 2017-08-26 ENCOUNTER — Ambulatory Visit
Admission: RE | Admit: 2017-08-26 | Discharge: 2017-08-26 | Disposition: A | Discharge status: Home / Self Care | Payer: 59 | Source: Ambulatory Visit | Attending: Radiation Oncology | Admitting: Radiation Oncology

## 2017-08-26 DIAGNOSIS — Z510 Encounter for antineoplastic radiation therapy: Secondary | ICD-10-CM | POA: Diagnosis not present

## 2017-08-27 ENCOUNTER — Ambulatory Visit
Admission: RE | Admit: 2017-08-27 | Discharge: 2017-08-27 | Disposition: A | Discharge status: Home / Self Care | Payer: 59 | Source: Ambulatory Visit | Attending: Radiation Oncology | Admitting: Radiation Oncology

## 2017-08-27 DIAGNOSIS — Z510 Encounter for antineoplastic radiation therapy: Secondary | ICD-10-CM | POA: Diagnosis not present

## 2017-08-28 ENCOUNTER — Ambulatory Visit
Admission: RE | Admit: 2017-08-28 | Discharge: 2017-08-28 | Disposition: A | Discharge status: Home / Self Care | Payer: 59 | Source: Ambulatory Visit | Attending: Radiation Oncology | Admitting: Radiation Oncology

## 2017-08-28 DIAGNOSIS — Z510 Encounter for antineoplastic radiation therapy: Secondary | ICD-10-CM | POA: Diagnosis not present

## 2017-08-29 ENCOUNTER — Ambulatory Visit
Admission: RE | Admit: 2017-08-29 | Discharge: 2017-08-29 | Disposition: A | Discharge status: Home / Self Care | Payer: 59 | Source: Ambulatory Visit | Attending: Radiation Oncology | Admitting: Radiation Oncology

## 2017-08-29 DIAGNOSIS — Z510 Encounter for antineoplastic radiation therapy: Secondary | ICD-10-CM | POA: Diagnosis not present

## 2017-08-31 ENCOUNTER — Ambulatory Visit: Payer: 59

## 2017-08-31 ENCOUNTER — Ambulatory Visit
Admission: RE | Admit: 2017-08-31 | Discharge: 2017-08-31 | Disposition: A | Discharge status: Home / Self Care | Payer: 59 | Source: Ambulatory Visit | Attending: Radiation Oncology | Admitting: Radiation Oncology

## 2017-08-31 DIAGNOSIS — Z510 Encounter for antineoplastic radiation therapy: Secondary | ICD-10-CM | POA: Diagnosis not present

## 2017-09-01 ENCOUNTER — Ambulatory Visit
Admission: RE | Admit: 2017-09-01 | Discharge: 2017-09-01 | Disposition: A | Discharge status: Home / Self Care | Payer: 59 | Source: Ambulatory Visit | Attending: Radiation Oncology | Admitting: Radiation Oncology

## 2017-09-01 ENCOUNTER — Ambulatory Visit: Payer: 59

## 2017-09-01 DIAGNOSIS — Z510 Encounter for antineoplastic radiation therapy: Secondary | ICD-10-CM | POA: Diagnosis not present

## 2017-09-02 ENCOUNTER — Ambulatory Visit
Admission: RE | Admit: 2017-09-02 | Discharge: 2017-09-02 | Disposition: A | Discharge status: Home / Self Care | Payer: 59 | Source: Ambulatory Visit | Attending: Radiation Oncology | Admitting: Radiation Oncology

## 2017-09-02 ENCOUNTER — Ambulatory Visit: Payer: 59

## 2017-09-02 DIAGNOSIS — Z510 Encounter for antineoplastic radiation therapy: Secondary | ICD-10-CM | POA: Diagnosis not present

## 2017-09-03 ENCOUNTER — Other Ambulatory Visit: Payer: Self-pay

## 2017-09-03 DIAGNOSIS — C50411 Malignant neoplasm of upper-outer quadrant of right female breast: Secondary | ICD-10-CM

## 2017-09-03 DIAGNOSIS — Z171 Estrogen receptor negative status [ER-]: Principal | ICD-10-CM

## 2017-09-03 MED ORDER — CAPECITABINE 500 MG PO TABS: ORAL_TABLET | ORAL | 0 refills | Status: DC

## 2017-09-08 ENCOUNTER — Ambulatory Visit
Admission: RE | Admit: 2017-09-08 | Discharge: 2017-09-08 | Disposition: A | Discharge status: Home / Self Care | Payer: 59 | Source: Ambulatory Visit | Attending: Radiation Oncology | Admitting: Radiation Oncology

## 2017-09-08 ENCOUNTER — Other Ambulatory Visit: Payer: Self-pay | Admitting: *Deleted

## 2017-09-08 DIAGNOSIS — Z510 Encounter for antineoplastic radiation therapy: Secondary | ICD-10-CM | POA: Diagnosis not present

## 2017-09-08 DIAGNOSIS — I8290 Acute embolism and thrombosis of unspecified vein: Secondary | ICD-10-CM

## 2017-09-08 MED ORDER — RIVAROXABAN 20 MG PO TABS: 20.0000 mg | ORAL_TABLET | Freq: Every day | ORAL | 1 refills | Status: DC

## 2017-09-08 NOTE — Telephone Encounter (Signed)
Patient requesting refill on Xarelto for a blood clot.  [facility] started her on.  Refill called into her pharmacy per Dr. [name] ok.

## 2017-09-09 ENCOUNTER — Other Ambulatory Visit: Payer: 59

## 2017-09-09 ENCOUNTER — Ambulatory Visit: Payer: 59 | Admitting: Hematology and Oncology

## 2017-09-09 ENCOUNTER — Ambulatory Visit
Admission: RE | Admit: 2017-09-09 | Discharge: 2017-09-09 | Disposition: A | Discharge status: Home / Self Care | Payer: 59 | Source: Ambulatory Visit | Attending: Radiation Oncology | Admitting: Radiation Oncology

## 2017-09-09 DIAGNOSIS — Z510 Encounter for antineoplastic radiation therapy: Secondary | ICD-10-CM | POA: Diagnosis not present

## 2017-09-10 ENCOUNTER — Other Ambulatory Visit: Payer: 59

## 2017-09-10 ENCOUNTER — Ambulatory Visit
Admission: RE | Admit: 2017-09-10 | Discharge: 2017-09-10 | Disposition: A | Discharge status: Home / Self Care | Payer: 59 | Source: Ambulatory Visit | Attending: Radiation Oncology | Admitting: Radiation Oncology

## 2017-09-10 ENCOUNTER — Encounter: Payer: Self-pay | Admitting: Radiation Oncology

## 2017-09-10 ENCOUNTER — Ambulatory Visit: Payer: 59 | Admitting: Hematology and Oncology

## 2017-09-10 DIAGNOSIS — Z510 Encounter for antineoplastic radiation therapy: Secondary | ICD-10-CM | POA: Diagnosis not present

## 2017-09-11 ENCOUNTER — Other Ambulatory Visit: Payer: Self-pay | Admitting: *Deleted

## 2017-09-12 ENCOUNTER — Other Ambulatory Visit: Payer: Self-pay

## 2017-09-12 ENCOUNTER — Ambulatory Visit (HOSPITAL_BASED_OUTPATIENT_CLINIC_OR_DEPARTMENT_OTHER): Payer: 59

## 2017-09-12 ENCOUNTER — Ambulatory Visit (HOSPITAL_BASED_OUTPATIENT_CLINIC_OR_DEPARTMENT_OTHER): Payer: 59 | Admitting: Hematology and Oncology

## 2017-09-12 ENCOUNTER — Other Ambulatory Visit (HOSPITAL_BASED_OUTPATIENT_CLINIC_OR_DEPARTMENT_OTHER): Payer: 59

## 2017-09-12 ENCOUNTER — Other Ambulatory Visit: Payer: Self-pay | Admitting: Hematology and Oncology

## 2017-09-12 DIAGNOSIS — C50411 Malignant neoplasm of upper-outer quadrant of right female breast: Secondary | ICD-10-CM

## 2017-09-12 DIAGNOSIS — C7951 Secondary malignant neoplasm of bone: Secondary | ICD-10-CM | POA: Diagnosis not present

## 2017-09-12 DIAGNOSIS — C773 Secondary and unspecified malignant neoplasm of axilla and upper limb lymph nodes: Secondary | ICD-10-CM | POA: Diagnosis not present

## 2017-09-12 DIAGNOSIS — E876 Hypokalemia: Secondary | ICD-10-CM

## 2017-09-12 DIAGNOSIS — G893 Neoplasm related pain (acute) (chronic): Secondary | ICD-10-CM | POA: Diagnosis not present

## 2017-09-12 DIAGNOSIS — C642 Malignant neoplasm of left kidney, except renal pelvis: Secondary | ICD-10-CM

## 2017-09-12 DIAGNOSIS — Z171 Estrogen receptor negative status [ER-]: Secondary | ICD-10-CM | POA: Diagnosis not present

## 2017-09-12 DIAGNOSIS — C787 Secondary malignant neoplasm of liver and intrahepatic bile duct: Secondary | ICD-10-CM | POA: Diagnosis not present

## 2017-09-12 LAB — CBC WITH DIFFERENTIAL/PLATELET
BASO%: 0.3 % (ref 0.0–2.0)
Basophils Absolute: 0 10*3/uL (ref 0.0–0.1)
EOS%: 1 % (ref 0.0–7.0)
Eosinophils Absolute: 0.1 10*3/uL (ref 0.0–0.5)
HCT: 34.1 % — ABNORMAL LOW (ref 34.8–46.6)
HGB: 11.6 g/dL (ref 11.6–15.9)
LYMPH%: 15.1 % (ref 14.0–49.7)
MCH: 30.4 pg (ref 25.1–34.0)
MCHC: 34 g/dL (ref 31.5–36.0)
MCV: 89.5 fL (ref 79.5–101.0)
MONO#: 0.4 10*3/uL (ref 0.1–0.9)
MONO%: 5.9 % (ref 0.0–14.0)
NEUT#: 4.6 10*3/uL (ref 1.5–6.5)
NEUT%: 77.7 % — AB (ref 38.4–76.8)
PLATELETS: 246 10*3/uL (ref 145–400)
RBC: 3.81 10*6/uL (ref 3.70–5.45)
RDW: 14.9 % — ABNORMAL HIGH (ref 11.2–14.5)
WBC: 6 10*3/uL (ref 3.9–10.3)
lymph#: 0.9 10*3/uL (ref 0.9–3.3)

## 2017-09-12 LAB — COMPREHENSIVE METABOLIC PANEL
ALBUMIN: 3.4 g/dL — AB (ref 3.5–5.0)
ALK PHOS: 196 U/L — AB (ref 40–150)
ALT: 27 U/L (ref 0–55)
AST: 32 U/L (ref 5–34)
Anion Gap: 11 mEq/L (ref 3–11)
BILIRUBIN TOTAL: 0.4 mg/dL (ref 0.20–1.20)
BUN: 5.5 mg/dL — AB (ref 7.0–26.0)
CO2: 25 mEq/L (ref 22–29)
CREATININE: 0.8 mg/dL (ref 0.6–1.1)
Calcium: 9.6 mg/dL (ref 8.4–10.4)
Chloride: 103 mEq/L (ref 98–109)
EGFR: >60 mL/min/{1.73_m2} (ref >60)
GLUCOSE: 130 mg/dL (ref 70–140)
Potassium: 2.8 mEq/L — CL (ref 3.5–5.1)
SODIUM: 139 meq/L (ref 136–145)
TOTAL PROTEIN: 7.8 g/dL (ref 6.4–8.3)

## 2017-09-12 MED ORDER — POTASSIUM CHLORIDE 10 MEQ/100ML IV SOLN: 10.0000 meq | Freq: Once | INTRAVENOUS | Status: DC

## 2017-09-12 MED ORDER — SODIUM CHLORIDE 0.9 % IV SOLN
1.4000 mg/m2 | Freq: Once | INTRAVENOUS | Status: AC
  Administered 2017-09-12: 2.6 mg via INTRAVENOUS
  Filled 2017-09-12: qty 5.2

## 2017-09-12 MED ORDER — SODIUM CHLORIDE 0.9 % IV SOLN
Freq: Once | INTRAVENOUS | Status: AC
  Administered 2017-09-12: 12:00:00 via INTRAVENOUS

## 2017-09-12 MED ORDER — LIDOCAINE-PRILOCAINE 2.5-2.5 % EX CREA: TOPICAL_CREAM | CUTANEOUS | 3 refills | Status: DC

## 2017-09-12 MED ORDER — HEPARIN SOD (PORK) LOCK FLUSH 100 UNIT/ML IV SOLN
500.0000 [IU] | Freq: Once | INTRAVENOUS | Status: AC | PRN
  Administered 2017-09-12: 500 [IU]
  Filled 2017-09-12: qty 5

## 2017-09-12 MED ORDER — SODIUM CHLORIDE 0.9% FLUSH
10.0000 mL | INTRAVENOUS | Status: DC | PRN
  Administered 2017-09-12: 10 mL
  Filled 2017-09-12: qty 10

## 2017-09-12 MED ORDER — OXYCODONE HCL 5 MG PO TABS: 5.0000 mg | ORAL_TABLET | ORAL | 0 refills | Status: DC | PRN

## 2017-09-12 MED ORDER — GABAPENTIN 300 MG PO CAPS
300.0000 mg | ORAL_CAPSULE | Freq: Three times a day (TID) | ORAL | 3 refills | Status: AC
Start: 2017-09-12 — End: ?

## 2017-09-12 MED ORDER — GABAPENTIN 300 MG PO CAPS: 300.0000 mg | ORAL_CAPSULE | Freq: Three times a day (TID) | ORAL | 3 refills | Status: DC

## 2017-09-12 MED ORDER — POTASSIUM CHLORIDE 10 MEQ/100ML IV SOLN
10.0000 meq | Freq: Once | INTRAVENOUS | Status: AC
Start: 2017-09-12 — End: 2017-09-12
  Administered 2017-09-12: 10 meq via INTRAVENOUS
  Filled 2017-09-12: qty 100

## 2017-09-12 MED ORDER — POTASSIUM CHLORIDE CRYS ER 20 MEQ PO TBCR: 20.0000 meq | EXTENDED_RELEASE_TABLET | Freq: Every day | ORAL | 0 refills | Status: DC

## 2017-09-12 MED ORDER — PROCHLORPERAZINE MALEATE 10 MG PO TABS
ORAL_TABLET | ORAL | Status: AC
  Filled 2017-09-12: qty 1

## 2017-09-12 MED ORDER — PROCHLORPERAZINE MALEATE 10 MG PO TABS
10.0000 mg | ORAL_TABLET | Freq: Once | ORAL | Status: AC
  Administered 2017-09-12: 10 mg via ORAL

## 2017-09-12 NOTE — Progress Notes (Signed)
 Patient Care Team: Griffin, Elaine, MD as PCP - General (Family Medicine) Magrinat, Gustav C, MD as Consulting Physician (Oncology) Wentworth, Stacy, MD (Inactive) as Consulting Physician (Radiation Oncology) Stuart, Dawn C, RN as Registered Nurse Martini, Keisha N, RN as Registered Nurse Newman, David, MD as Consulting Physician (General Surgery) Mackey, Heather Thompson, NP as Nurse Practitioner (Hematology and Oncology)  DIAGNOSIS:  Encounter Diagnosis  Name Primary?  . Malignant neoplasm of upper-outer quadrant of right breast in female, estrogen receptor negative (HCC)     SUMMARY OF ONCOLOGIC HISTORY:   Breast cancer of upper-outer quadrant of right female breast (HCC)   01/19/2015 Mammogram    Right breast: density with indistinct margin at 12:00, posterior depth; also oval mass with obscured margin at 11:00, middle depth; irregular mass with indistinct margin central to nipple, middle depth      01/23/2015 Initial Biopsy    Right breast 9:00 and 10:00 biopsy ([facility]): Grade 3 IDC ER/PR negative, HER-2 negative, Ki-67 77% and 73%; the right axillary lymph node biopsy: ER 22% positive      01/30/2015 Breast MRI    Right breast masses 9 and 10:00 position, additional irregular masses with non-mass enhancement upper outer quadrant with one mass extending into upper inner quadrant, multifocal, multicentric 13.7 x 4.6 x 4.6 cm, malignant right axillary lymph node      02/08/2015 Clinical Stage    Stage IIIA: T3, N1, M0      02/09/2015 Procedure    BreastNext panel revealed VUS BRCA2 gene called p.[id number] (c.[id number]>G); otherwise no clinically significant variant at ATM, BARD1, BRCA1, BRCA2, BRIP1, CDH1, CHEK2, MRE11A, MUTYH, NBN, NF1, PALB2, PTEN, RAD50, RAD51C, RAD51D, and TP53      02/15/2015 Imaging    No evidence of metastatic disease, 2.2 cm enhancing lesion anterior left lower kidney suspicious for solid renal neoplasm      02/20/2015 - 07/10/2015 Neo-Adjuvant Chemotherapy   dose dense doxorubicin and cyclophosphamide x 4 followed by Abraxane weekly x 12      07/10/2015 Breast MRI    Multiple irregular enhancing masses right breast are less confluent and smaller dominant mass 10:00 2.1 x 1.9 x 1.3 cm; right breast 11:00 mass measures 1.2 x 1.2 x 0.8 cm, decreased right axillary lymph node thickening      07/28/2015 Definitive Surgery    Rt mastectomy: Multifocal IDC 2 cm, 1.5 cm. 0.6 cm; 3/13 LN positive, ER/PR HER-2      07/28/2015 Pathologic Stage    Stage IIA: T1c N1 M0        09/03/2015 - 10/13/2015 Radiation Therapy    Adjuvant radiation therapy: Right chest wall/50.4 Gray @ 1.8 Gray per fraction x 28 fractions. Right supraclavicular fossa 45 Gy @1.8 Gy per fraction x 25 fractions      11/15/2015 - 05/10/2016 Chemotherapy    Adjuvant capecitabine - completed 8 cycles      11/16/2015 Survivorship    Survivorship visit completed and copy of care plan given to patient      02/23/2016 Procedure    Left mandible biopsy: Scant material, no malignancy      05/21/2016 -  Anti-estrogen oral therapy    Patient did not take tamoxifen because her ER was weakly positive at 22% and she was worried about toxicities      02/21/2017 Imaging    Rt Axilla 8 X 7 X 7 mm mass inv pectoralis musculature, additional mass 9 mm 1.5 cm apart      02/21/2017 Relapse/Recurrence      Right axillary mass biopsy: 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      03/24/2017 Miscellaneous    Foundation 1 analysis: Microsatellite stable; TMB 9 muts/Mb; MYC amplification, PIK3C2B p717L; TP53 [id number]      03/25/2017 - 07/15/2017 Chemotherapy    Carboplatin and gemcitabine palliative chemotherapy      07/28/2017 PET scan    Progression of metastatic disease new liver lesion on 0.6 cm SUV 7.2, previous liver lesion 1.4 cm unchanged but shows no activity; scattered hypermetabolic bone lesions some of which are new, scattered pulmonary nodules stable hypermetabolic right subpectoral and  mediastinal/internal mammary nodes stable      08/22/2017 - 09/10/2017 Radiation Therapy    Palliative radiation to brachial plexus concurrently with Xeloda      09/12/2017 -  Chemotherapy    Halaven day 1 day 8 every 3 weeks        Renal cell carcinoma of left kidney ([place])   01/29/2016 Surgery    Left renal mass partial resection: 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  She just completed palliative radiation therapy to the brachial plexus.  The pain in the shoulder has resolved completely.  But her right palm appears to be in significant discomfort.  She feels that the right electric pulsations going into her arm.  It also feels heavy and uncomfortable.  REVIEW OF SYSTEMS:   Constitutional: Denies fevers, chills or abnormal weight loss Eyes: Denies blurriness of vision Ears, nose, mouth, throat, and face: Denies mucositis or sore throat Respiratory: Denies cough, dyspnea or wheezes Cardiovascular: Denies palpitation, chest discomfort Gastrointestinal:  Denies nausea, heartburn or change in bowel habits Skin: Denies abnormal skin rashes Lymphatics: Denies new lymphadenopathy or easy bruising Neurological:Denies numbness, tingling or new weaknesses Behavioral/Psych: Mood is stable, no new changes  Extremities: Right arm discomfort pain and electric shocklike sensations All other systems were reviewed with the patient and are negative.  I have reviewed the past medical history, past surgical history, social history and family history with the patient and they are unchanged from previous note.  ALLERGIES:  is allergic to sulfa antibiotics.  MEDICATIONS:  Current Outpatient  Medications  Medication Sig Dispense Refill  . benazepril (LOTENSIN) 10 MG tablet Take 1 tablet (10 mg total) by mouth daily.    . Black Pepper-Turmeric 3-500 MG CAPS Take 1 capsule by mouth daily.    Marland Kitchen gabapentin (NEURONTIN) 300 MG capsule Take 1 capsule (300 mg total) by mouth 3 (three) times daily. 90 capsule 3  . hyaluronate sodium (RADIAPLEXRX) GEL Apply 1 application 2 (two) times daily topically.    . lidocaine-prilocaine (EMLA) cream Apply to affected area once 30 g 3  . Multiple Vitamins-Minerals (MULTIVITAMIN) tablet Take 1 tablet by mouth daily.    Marland Kitchen oxyCODONE (OXY IR/ROXICODONE) 5 MG immediate release tablet Take 1 tablet (5 mg total) by mouth every 4 (four) hours as needed for severe pain. 90 tablet 0  . potassium chloride SA (K-DUR,KLOR-CON) 20 MEQ tablet Take 1 tablet (20 mEq total) by mouth daily. 30 tablet 0  . pregabalin (LYRICA) 75 MG capsule Take 1 capsule (75 mg total) by mouth 2 (two) times daily. 60 capsule 1  .  Probiotic Product (PROBIOTIC PO) Take 1 tablet by mouth daily.    . rivaroxaban (XARELTO) 20 MG TABS tablet Take 1 tablet (20 mg total) by mouth daily with supper. 90 tablet 1   No current facility-administered medications for this visit.    Facility-Administered Medications Ordered in Other Visits  Medication Dose Route Frequency Provider Last Rate Last Dose  . 0.9 %  sodium chloride infusion   Intravenous Once Nicholas Lose, MD      . eriBULin mesylate (HALAVEN) 2.6 mg in sodium chloride 0.9 % 100 mL chemo infusion  1.4 mg/m2 (Treatment Plan Recorded) Intravenous Once Nicholas Lose, MD      . heparin lock flush 100 unit/mL  500 Units Intracatheter Once PRN Nicholas Lose, MD      . potassium chloride 10 mEq in 100 mL IVPB  10 mEq Intravenous Once Nicholas Lose, MD   10 mEq at 09/12/17 1226  . prochlorperazine (COMPAZINE) tablet 10 mg  10 mg Oral Once Nicholas Lose, MD      . sodium chloride flush (NS) 0.9 % injection 10 mL  10 mL Intracatheter PRN Nicholas Lose,  MD        PHYSICAL EXAMINATION: ECOG PERFORMANCE STATUS: 1 - Symptomatic but completely ambulatory  Vitals:   09/12/17 1122  BP: (!) 151/92  Pulse: (!) 112  Resp: 18  Temp: 97.8 F (36.6 C)  SpO2: 100%   Filed Weights   09/12/17 1122  Weight: 159 lb 3.2 oz (72.2 kg)    GENERAL:alert, no distress and comfortable SKIN: skin color, texture, turgor are normal, no rashes or significant lesions EYES: normal, Conjunctiva are pink and non-injected, sclera clear OROPHARYNX:no exudate, no erythema and lips, buccal mucosa, and tongue normal  NECK: supple, thyroid normal size, non-tender, without nodularity LYMPH:  no palpable lymphadenopathy in the cervical, axillary or inguinal LUNGS: clear to auscultation and percussion with normal breathing effort HEART: regular rate & rhythm and no murmurs and no lower extremity edema ABDOMEN:abdomen soft, non-tender and normal bowel sounds MUSCULOSKELETAL:no cyanosis of digits and no clubbing  NEURO: Right arm neurological issues with numbness electric shocklike pulsations and feeling of heaviness EXTREMITIES: No lower extremity edema  LABORATORY DATA:  I have reviewed the data as listed   Chemistry      Component Value Date/Time   NA 139 09/12/2017 1056   K 2.8 (LL) 09/12/2017 1056   CL 104 04/23/2016 1551   CO2 25 09/12/2017 1056   BUN 5.5 (L) 09/12/2017 1056   CREATININE 0.8 09/12/2017 1056      Component Value Date/Time   CALCIUM 9.6 09/12/2017 1056   ALKPHOS 196 (H) 09/12/2017 1056   AST 32 09/12/2017 1056   ALT 27 09/12/2017 1056   BILITOT 0.40 09/12/2017 1056       Lab Results  Component Value Date   WBC 6.0 09/12/2017   HGB 11.6 09/12/2017   HCT 34.1 (L) 09/12/2017   MCV 89.5 09/12/2017   PLT 246 09/12/2017   NEUTROABS 4.6 09/12/2017    ASSESSMENT & PLAN:  Breast cancer of upper-outer quadrant of right female breast Right breast invasive ductal carcinoma multifocal, multicentric disease with at least 8 tumors, 2.3  cm, 1.6 cm of the biggest tumors in addition 6 more tumors 1 cm or less spanning 13.7 cm, right axillary lymph node biopsy positive (ER 22%); primary tumor is ER 0%, PR 0%, HER-2 negative, Ki-67 77% and 73% respectively, grade 3 T3 N1 M0 equals stage IIIa, Grade 3 1. Rt mastectomy  07/28/15: Multifocal IDC 2 cm, 1.5 cm. 0.6 cm; 3/13 LN positive, T1c N1 M0 stage IIA, ER/PR HER-2 negative. 2. Adjuvant XRT 09/03/15 to 10/13/15 3. Adjuvant Xeloda X 6 months (8 cycles) started 11/15/2015 completed July 2017 4. Carboplatin and gemcitabine started 03/14/2017 ----------------------------------------------------------------------- Rt Axillary LN Biopsy: Triple negative right breast cancer [facility] at [facility]: Poorly differentiated metastatic adenocarcinoma triple negative  PET/CT 07/28/2017:Progression of metastatic disease new liver lesion on 0.6 cm SUV 7.2, previous liver lesion 1.4 cm unchanged but shows no activity; scattered hypermetabolic bone lesions some of which are new, scattered pulmonary nodules stable hypermetabolic right subpectoral and mediastinal/internal mammary nodes stable  Severe right brachial plexus pain: Palliative radiation with oral Xeloda concurrently completed 09/11/2017 Right arm feeling of electric shocks: I sent a prescription for gabapentin.  If her symptoms do not get better than we need a neurology consult.  Current treatment: Halaven day 1 day 8 every 3 weeks started 09/12/2017, today cycle 1 day 1  I would like to see her next week  I spent 25 minutes talking to the patient of which more than half was spent in counseling and coordination of care.  No orders of the defined types were placed in this encounter.  The patient has a good understanding of the overall plan. she agrees with it. she will call with any problems that may develop before the next visit here.   Rulon Eisenmenger, MD 09/12/17

## 2017-09-12 NOTE — Progress Notes (Signed)
DISCONTINUE OFF PATHWAY REGIMEN - Breast   [id number]:Gemcitabine + Carboplatin (1000/2) q21 Days:   A cycle is every 21 days:     Gemcitabine      Carboplatin   **Always confirm dose/schedule in your pharmacy ordering system**    REASON: Disease Progression PRIOR TREATMENT: Off Pathway: Gemcitabine + Carboplatin (1000/2) q21 Days TREATMENT RESPONSE: Progressive Disease (PD)  START ON PATHWAY REGIMEN - Breast     A cycle is every 21 days:     Eribulin mesylate   **Always confirm dose/schedule in your pharmacy ordering system**    Patient Characteristics: Metastatic Chemotherapy, HER2 Negative/Unknown/Equivocal, ER Negative/Unknown, Third Line, Prior Anthracycline or Anthracycline Contraindicated and Prior Taxane Therapeutic Status: Distant Metastases BRCA Mutation Status: Absent ER Status: Negative (-) HER2 Status: Negative (-) Would you be surprised if this patient died  in the next year<= I would be surprised if this patient died in the next year PR Status: Negative (-) Line of therapy: Third Line Intent of Therapy: Non-Curative / Palliative Intent, Discussed with Patient

## 2017-09-12 NOTE — Patient Instructions (Signed)
[place] Discharge Instructions for Patients Receiving Chemotherapy  Today you received the following chemotherapy agents Halaven (ERIBULIN).    To help prevent nausea and vomiting after your treatment, we encourage you to take your nausea medication as directed by your doctor.     If you develop nausea and vomiting that is not controlled by your nausea medication, call the clinic.   BELOW ARE SYMPTOMS THAT SHOULD BE REPORTED IMMEDIATELY:  *FEVER GREATER THAN 100.5 F  *CHILLS WITH OR WITHOUT FEVER  NAUSEA AND VOMITING THAT IS NOT CONTROLLED WITH YOUR NAUSEA MEDICATION  *UNUSUAL SHORTNESS OF BREATH  *UNUSUAL BRUISING OR BLEEDING  TENDERNESS IN MOUTH AND THROAT WITH OR WITHOUT PRESENCE OF ULCERS  *URINARY PROBLEMS  *BOWEL PROBLEMS  UNUSUAL RASH Items with * indicate a potential emergency and should be followed up as soon as possible.  Feel free to call the clinic should you have any questions or concerns. The clinic phone number is [phone].  Please show the [facility] at check-in to the Emergency Department and triage nurse.  Eribulin solution for injection What is this medicine? ERIBULIN (er e bu lin) is a chemotherapy drug. It is used to treat breast cancer and liposarcoma. This medicine may be used for other purposes; ask your health care provider or pharmacist if you have questions. COMMON BRAND NAME(S): Halaven What should I tell my health care provider before I take this medicine? They need to know if you have any of these conditions: -heart disease -history of irregular heartbeat -kidney disease -liver disease -low blood counts, like low white cell, platelet, or red cell counts -low levels of potassium or magnesium in the blood -an unusual or allergic reaction to eribulin, other medicines, foods, dyes, or preservatives -pregnant or trying to get pregnant -breast-feeding How should I use this medicine? This medicine is for infusion  into a vein. It is given by a health care professional in a hospital or clinic setting. Talk to your pediatrician regarding the use of this medicine in children. Special care may be needed. Overdosage: If you think you have taken too much of this medicine contact a poison control center or emergency room at once. NOTE: This medicine is only for you. Do not share this medicine with others. What if I miss a dose? It is important not to miss your dose. Call your doctor or health care professional if you are unable to keep an appointment. What may interact with this medicine? Do not take this medicine with any of the following medications: -amiodarone -astemizole -arsenic trioxide -bepridil -bretylium -chloroquine -chlorpromazine -cisapride -clarithromycin -dextromethorphan, quinidine -disopyramide -dofetilide -droperidol -dronedarone -erythromycin -grepafloxacin -halofantrine -haloperidol -ibutilide -levomethadyl -mesoridazine -methadone -pentamidine -procainamide -quinidine -pimozide -posaconazole -probucol -propafenone -saquinavir -sotalol -sparfloxacin -terfenadine -thioridazine -troleandomycin -ziprasidone This list may not describe all possible interactions. Give your health care provider a list of all the medicines, herbs, non-prescription drugs, or dietary supplements you use. Also tell them if you smoke, drink alcohol, or use illegal drugs. Some items may interact with your medicine. What should I watch for while using this medicine? This drug may make you feel generally unwell. This is not uncommon, as chemotherapy can affect healthy cells as well as cancer cells. Report any side effects. Continue your course of treatment even though you feel ill unless your doctor tells you to stop. Call your doctor or health care professional for advice if you get a fever, chills or sore throat, or other symptoms of a cold or flu.  Do not treat yourself. This drug decreases your  body's ability to fight infections. Try to avoid being around people who are sick. This medicine may increase your risk to bruise or bleed. Call your doctor or health care professional if you notice any unusual bleeding. You may need blood work done while you are taking this medicine. Do not become pregnant while taking this medicine or for 2 weeks after stopping it. Women should inform their doctor if they wish to become pregnant or think they might be pregnant. Men should not father a child while taking this medicine and for 3.5 months after stopping it. There is a potential for serious side effects to an unborn child. Talk to your health care professional or pharmacist for more information. Do not breast-feed an infant while taking this medicine or for 2 weeks after stopping it. What side effects may I notice from receiving this medicine? Side effects that you should report to your doctor or health care professional as soon as possible: -allergic reactions like skin rash, itching or hives, swelling of the face, lips, or tongue -low blood counts - this medicine may decrease the number of white blood cells, red blood cells and platelets. You may be at increased risk for infections and bleeding. -signs of infection - fever or chills, cough, sore throat, pain or difficulty passing urine -signs of decreased platelets or bleeding - bruising, pinpoint red spots on the skin, black, tarry stools, blood in the urine -signs of decreased red blood cells - unusually weak or tired, fainting spells, lightheadedness -pain, tingling, numbness in the hands or feet Side effects that usually do not require medical attention (report to your doctor or health care professional if they continue or are bothersome): -constipation -hair loss -headache -loss of appetite -muscle or joint pain -nausea, vomiting -stomach pain This list may not describe all possible side effects. Call your doctor for medical advice about  side effects. You may report side effects to FDA at 1-800-FDA-1088. Where should I keep my medicine? This drug is given in a hospital or clinic and will not be stored at home. NOTE: This sheet is a summary. It may not cover all possible information. If you have questions about this medicine, talk to your doctor, pharmacist, or health care provider.  2018 Elsevier/Gold Standard (2015-11-02 10:11:26)

## 2017-09-12 NOTE — Progress Notes (Signed)
Pt potassium 2.8. Dr.Gudena reviewed and obtained order for 1 run of 27meq kcl IV with today's treatment.   Added 15meq kcl po to take at home per Dr.Gudena. Notfiied infusion RN.

## 2017-09-12 NOTE — Progress Notes (Signed)
Dr. [name] okay to tx with K 2.8 and HR 112. Pt to receive IV potassium today and 20mg  PO potassium daily at home. Pharmacy aware per May Armel, RN.

## 2017-09-12 NOTE — Assessment & Plan Note (Signed)
Right breast invasive ductal carcinoma multifocal, multicentric disease with at least 8 tumors, 2.3 cm, 1.6 cm of the biggest tumors in addition 6 more tumors 1 cm or less spanning 13.7 cm, right axillary lymph node biopsy positive (ER 22%); primary tumor is ER 0%, PR 0%, HER-2 negative, Ki-67 77% and 73% respectively, grade 3 T3 N1 M0 equals stage IIIa, Grade 3 1. Rt mastectomy 07/28/15: Multifocal IDC 2 cm, 1.5 cm. 0.6 cm; 3/13 LN positive, T1c N1 M0 stage IIA, ER/PR HER-2 negative. 2. Adjuvant XRT 09/03/15 to 10/13/15 3. Adjuvant Xeloda X 6 months (8 cycles) started 11/15/2015 completed July 2017 4. Carboplatin and gemcitabine started 03/14/2017  Current treatment: Halaven day 1 day 8 every 3 weeks started 09/12/2017, today cycle 1 day 1 ----------------------------------------------------------------------- Rt Axillary LN Biopsy: Triple negative right breast cancer Bronchoscopic Biopsy at [facility]: Poorly differentiated metastatic adenocarcinoma triple negative  PET/CT 07/28/2017:Progression of metastatic disease new liver lesion on 0.6 cm SUV 7.2, previous liver lesion 1.4 cm unchanged but shows no activity; scattered hypermetabolic bone lesions some of which are new, scattered pulmonary nodules stable hypermetabolic right subpectoral and mediastinal/internal mammary nodes stable  Severe right brachial plexus pain: Palliative radiation with oral Xeloda concurrently  In 3 weeks she will start palliative systemic chemotherapy with Halaven.  Patient has a plan to go to [place] during Thanksgiving time.

## 2017-09-15 ENCOUNTER — Telehealth: Payer: Self-pay

## 2017-09-15 NOTE — Telephone Encounter (Signed)
Left VM for pt to follow up after treatment on Friday.  Cyndia Bent RN

## 2017-09-16 NOTE — Progress Notes (Signed)
  Radiation Oncology         [phone] ________________________________  Name: Phyllis Pruitt MRN: [id number]  Date: 09/10/2017  DOB: 08/31/1971  End of Treatment Note  Diagnosis:   The encounter diagnosis was Malignant neoplasm of upper-outer quadrant of right breast in female, estrogen receptor negative ([place]).     Indication for treatment:  Palliative        Radiation treatment dates:   08/21/17 - 09/10/17  Site/dose:   Right Axilla // 24 Gy in 16 fx    5 Gy in 2 fx  Beams/energy:   Photon // 3D  Narrative: The patient tolerated radiation treatment relatively well.  No unexpected difficulties and appetite is good. Patient had mild tanning, skin intact she was using Radiaplex BID. Patient stated her arm was feeling a lot better. She continues to go to physcial therapy.  Plan: The patient has completed radiation treatment. The patient will return to radiation oncology clinic for routine followup in one month. I advised them to call or return sooner if they have any questions or concerns related to their recovery or treatment.  ------------------------------------------------  Jodelle Gross, MD, PhD   This document serves as a record of services personally performed by Kyung Rudd MD. It was created on his behalf by Delton Coombes, a trained medical scribe. The creation of this record is based on the scribe's personal observations and the provider's statements to them.

## 2017-09-19 ENCOUNTER — Other Ambulatory Visit (HOSPITAL_BASED_OUTPATIENT_CLINIC_OR_DEPARTMENT_OTHER): Payer: 59

## 2017-09-19 ENCOUNTER — Ambulatory Visit (HOSPITAL_BASED_OUTPATIENT_CLINIC_OR_DEPARTMENT_OTHER): Payer: 59

## 2017-09-19 ENCOUNTER — Ambulatory Visit (HOSPITAL_BASED_OUTPATIENT_CLINIC_OR_DEPARTMENT_OTHER): Payer: 59 | Admitting: Hematology and Oncology

## 2017-09-19 VITALS — BP 140/90 | HR 92

## 2017-09-19 DIAGNOSIS — C7951 Secondary malignant neoplasm of bone: Secondary | ICD-10-CM | POA: Diagnosis not present

## 2017-09-19 DIAGNOSIS — C773 Secondary and unspecified malignant neoplasm of axilla and upper limb lymph nodes: Secondary | ICD-10-CM | POA: Diagnosis not present

## 2017-09-19 DIAGNOSIS — C787 Secondary malignant neoplasm of liver and intrahepatic bile duct: Secondary | ICD-10-CM | POA: Diagnosis not present

## 2017-09-19 DIAGNOSIS — C50411 Malignant neoplasm of upper-outer quadrant of right female breast: Secondary | ICD-10-CM

## 2017-09-19 DIAGNOSIS — Z171 Estrogen receptor negative status [ER-]: Secondary | ICD-10-CM

## 2017-09-19 DIAGNOSIS — C642 Malignant neoplasm of left kidney, except renal pelvis: Secondary | ICD-10-CM

## 2017-09-19 DIAGNOSIS — C7800 Secondary malignant neoplasm of unspecified lung: Secondary | ICD-10-CM

## 2017-09-19 LAB — CBC WITH DIFFERENTIAL/PLATELET
BASO%: 0.4 % (ref 0.0–2.0)
Basophils Absolute: 0 10*3/uL (ref 0.0–0.1)
EOS%: 0.2 % (ref 0.0–7.0)
Eosinophils Absolute: 0 10*3/uL (ref 0.0–0.5)
HCT: 32.5 % — ABNORMAL LOW (ref 34.8–46.6)
HGB: 11 g/dL — ABNORMAL LOW (ref 11.6–15.9)
LYMPH%: 19.2 % (ref 14.0–49.7)
MCH: 30.4 pg (ref 25.1–34.0)
MCHC: 33.7 g/dL (ref 31.5–36.0)
MCV: 90.2 fL (ref 79.5–101.0)
MONO#: 0.2 10*3/uL (ref 0.1–0.9)
MONO%: 6.9 % (ref 0.0–14.0)
NEUT%: 73.3 % (ref 38.4–76.8)
NEUTROS ABS: 2.3 10*3/uL (ref 1.5–6.5)
Platelets: 247 10*3/uL (ref 145–400)
RBC: 3.61 10*6/uL — AB (ref 3.70–5.45)
RDW: 15.8 % — ABNORMAL HIGH (ref 11.2–14.5)
WBC: 3.2 10*3/uL — AB (ref 3.9–10.3)
lymph#: 0.6 10*3/uL — ABNORMAL LOW (ref 0.9–3.3)

## 2017-09-19 LAB — COMPREHENSIVE METABOLIC PANEL
ALBUMIN: 3.5 g/dL (ref 3.5–5.0)
ALK PHOS: 203 U/L — AB (ref 40–150)
ALT: 32 U/L (ref 0–55)
AST: 42 U/L — AB (ref 5–34)
Anion Gap: 12 mEq/L — ABNORMAL HIGH (ref 3–11)
BILIRUBIN TOTAL: 0.41 mg/dL (ref 0.20–1.20)
BUN: 4.3 mg/dL — AB (ref 7.0–26.0)
CALCIUM: 9.8 mg/dL (ref 8.4–10.4)
CO2: 25 mEq/L (ref 22–29)
CREATININE: 0.7 mg/dL (ref 0.6–1.1)
Chloride: 105 mEq/L (ref 98–109)
EGFR: >60 mL/min/{1.73_m2} (ref >60)
Glucose: 96 mg/dl (ref 70–140)
Potassium: 3.4 mEq/L — ABNORMAL LOW (ref 3.5–5.1)
Sodium: 142 mEq/L (ref 136–145)
TOTAL PROTEIN: 7.8 g/dL (ref 6.4–8.3)

## 2017-09-19 LAB — TECHNOLOGIST REVIEW

## 2017-09-19 MED ORDER — HEPARIN SOD (PORK) LOCK FLUSH 100 UNIT/ML IV SOLN
500.0000 [IU] | Freq: Once | INTRAVENOUS | Status: AC | PRN
  Administered 2017-09-19: 500 [IU]
  Filled 2017-09-19: qty 5

## 2017-09-19 MED ORDER — SODIUM CHLORIDE 0.9% FLUSH
10.0000 mL | INTRAVENOUS | Status: DC | PRN
  Administered 2017-09-19: 10 mL
  Filled 2017-09-19: qty 10

## 2017-09-19 MED ORDER — PROCHLORPERAZINE MALEATE 10 MG PO TABS
10.0000 mg | ORAL_TABLET | Freq: Once | ORAL | Status: AC
  Administered 2017-09-19: 10 mg via ORAL

## 2017-09-19 MED ORDER — LIDOCAINE-PRILOCAINE 2.5-2.5 % EX CREA: TOPICAL_CREAM | CUTANEOUS | 3 refills | Status: DC

## 2017-09-19 MED ORDER — SODIUM CHLORIDE 0.9 % IV SOLN
1.4000 mg/m2 | Freq: Once | INTRAVENOUS | Status: AC
  Administered 2017-09-19: 2.6 mg via INTRAVENOUS
  Filled 2017-09-19: qty 5.2

## 2017-09-19 MED ORDER — PROCHLORPERAZINE MALEATE 10 MG PO TABS
ORAL_TABLET | ORAL | Status: AC
  Filled 2017-09-19: qty 1

## 2017-09-19 MED ORDER — SODIUM CHLORIDE 0.9 % IV SOLN
Freq: Once | INTRAVENOUS | Status: AC
  Administered 2017-09-19: 12:00:00 via INTRAVENOUS

## 2017-09-19 NOTE — Progress Notes (Signed)
 Patient Care Team: Griffin, Elaine, MD as PCP - General (Family Medicine) Magrinat, Gustav C, MD as Consulting Physician (Oncology) Wentworth, Stacy, MD (Inactive) as Consulting Physician (Radiation Oncology) Stuart, Dawn C, RN as Registered Nurse Martini, Keisha N, RN as Registered Nurse Newman, David, MD as Consulting Physician (General Surgery) Mackey, Heather Thompson, NP as Nurse Practitioner (Hematology and Oncology)  DIAGNOSIS:  Encounter Diagnosis  Name Primary?  . Malignant neoplasm of upper-outer quadrant of right breast in female, estrogen receptor negative (HCC)     SUMMARY OF ONCOLOGIC HISTORY:   Breast cancer of upper-outer quadrant of right female breast (HCC)   01/19/2015 Mammogram    Right breast: density with indistinct margin at 12:00, posterior depth; also oval mass with obscured margin at 11:00, middle depth; irregular mass with indistinct margin central to nipple, middle depth      01/23/2015 Initial Biopsy    Right breast 9:00 and 10:00 biopsy ([facility]): Grade 3 IDC ER/PR negative, HER-2 negative, Ki-67 77% and 73%; the right axillary lymph node biopsy: ER 22% positive      01/30/2015 Breast MRI    Right breast masses 9 and 10:00 position, additional irregular masses with non-mass enhancement upper outer quadrant with one mass extending into upper inner quadrant, multifocal, multicentric 13.7 x 4.6 x 4.6 cm, malignant right axillary lymph node      02/08/2015 Clinical Stage    Stage IIIA: T3, N1, M0      02/09/2015 Procedure    BreastNext panel revealed VUS BRCA2 gene called p.[id number] (c.3749A>G); otherwise no clinically significant variant at ATM, BARD1, BRCA1, BRCA2, BRIP1, CDH1, CHEK2, MRE11A, MUTYH, NBN, NF1, PALB2, PTEN, RAD50, RAD51C, RAD51D, and TP53      02/15/2015 Imaging    No evidence of metastatic disease, 2.2 cm enhancing lesion anterior left lower kidney suspicious for solid renal neoplasm      02/20/2015 - 07/10/2015 Neo-Adjuvant Chemotherapy   dose dense doxorubicin and cyclophosphamide x 4 followed by Abraxane weekly x 12      07/10/2015 Breast MRI    Multiple irregular enhancing masses right breast are less confluent and smaller dominant mass 10:00 2.1 x 1.9 x 1.3 cm; right breast 11:00 mass measures 1.2 x 1.2 x 0.8 cm, decreased right axillary lymph node thickening      07/28/2015 Definitive Surgery    Rt mastectomy: Multifocal IDC 2 cm, 1.5 cm. 0.6 cm; 3/13 LN positive, ER/PR HER-2      07/28/2015 Pathologic Stage    Stage IIA: T1c N1 M0        09/03/2015 - 10/13/2015 Radiation Therapy    Adjuvant radiation therapy: Right chest wall/50.4 Gray @ 1.8 Gray per fraction x 28 fractions. Right supraclavicular fossa 45 Gy @1.8 Gy per fraction x 25 fractions      11/15/2015 - 05/10/2016 Chemotherapy    Adjuvant capecitabine - completed 8 cycles      11/16/2015 Survivorship    Survivorship visit completed and copy of care plan given to patient      02/23/2016 Procedure    Left mandible biopsy: Scant material, no malignancy      05/21/2016 -  Anti-estrogen oral therapy    Patient did not take tamoxifen because her ER was weakly positive at 22% and she was worried about toxicities      02/21/2017 Imaging    Rt Axilla 8 X 7 X 7 mm mass inv pectoralis musculature, additional mass 9 mm 1.5 cm apart      02/21/2017 Relapse/Recurrence      Right axillary mass biopsy: 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      03/24/2017 Miscellaneous    Foundation 1 analysis: Microsatellite stable; TMB 9 muts/Mb; MYC amplification, PIK3C2B p717L; TP53 [id number]      03/25/2017 - 07/15/2017 Chemotherapy    Carboplatin and gemcitabine palliative chemotherapy      07/28/2017 PET scan    Progression of metastatic disease new liver lesion on 0.6 cm SUV 7.2, previous liver lesion 1.4 cm unchanged but shows no activity; scattered hypermetabolic bone lesions some of which are new, scattered pulmonary nodules stable hypermetabolic right subpectoral and  mediastinal/internal mammary nodes stable      08/22/2017 - 09/10/2017 Radiation Therapy    Palliative radiation to brachial plexus concurrently with Xeloda      09/12/2017 -  Chemotherapy    Halaven day 1 day 8 every 3 weeks        Renal cell carcinoma of left kidney ([place])   01/29/2016 Surgery    Left renal mass partial resection: 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  Today is cycle 1 day 8.  She tolerated first dose of Halaven fairly well.  Did not have any nausea vomiting.  Monitoring closely for toxicities of neuropathy.  She continues to have discomfort in the right arm.  The distal part of the arm is numb and heavy feeling.  REVIEW OF SYSTEMS:   Constitutional: Denies fevers, chills or abnormal weight loss Eyes: Denies blurriness of vision Ears, nose, mouth, throat, and face: Denies mucositis or sore throat Respiratory: Denies cough, dyspnea or wheezes Cardiovascular: Denies palpitation, chest discomfort Gastrointestinal:  Denies nausea, heartburn or change in bowel habits Skin: Denies abnormal skin rashes Lymphatics: Denies new lymphadenopathy or easy bruising Neurological:Denies numbness, tingling or new weaknesses Behavioral/Psych: Mood is stable, no new changes  Extremities: Right arm discomfort Breast:  denies any pain or lumps or nodules in either breasts All other systems were reviewed with the patient and are negative.  I have reviewed the past medical history, past surgical history, social history and family history with the patient and they are unchanged from previous note.  ALLERGIES:  is allergic to sulfa antibiotics.  MEDICATIONS:  Current Outpatient  Medications  Medication Sig Dispense Refill  . benazepril (LOTENSIN) 10 MG tablet Take 1 tablet (10 mg total) by mouth daily.    . Black Pepper-Turmeric 3-500 MG CAPS Take 1 capsule by mouth daily.    Marland Kitchen gabapentin (NEURONTIN) 300 MG capsule Take 1 capsule (300 mg total) by mouth 3 (three) times daily. 90 capsule 3  . hyaluronate sodium (RADIAPLEXRX) GEL Apply 1 application 2 (two) times daily topically.    . lidocaine-prilocaine (EMLA) cream Apply to affected area once 30 g 3  . Multiple Vitamins-Minerals (MULTIVITAMIN) tablet Take 1 tablet by mouth daily.    Marland Kitchen oxyCODONE (OXY IR/ROXICODONE) 5 MG immediate release tablet Take 1 tablet (5 mg total) by mouth every 4 (four) hours as needed for severe pain. 90 tablet 0  . potassium chloride SA (K-DUR,KLOR-CON) 20 MEQ tablet Take 1 tablet (20 mEq total) by mouth daily. 30 tablet 0  . pregabalin (LYRICA) 75 MG capsule Take 1 capsule (75 mg total) by mouth 2 (  two) times daily. 60 capsule 1  . Probiotic Product (PROBIOTIC PO) Take 1 tablet by mouth daily.    . rivaroxaban (XARELTO) 20 MG TABS tablet Take 1 tablet (20 mg total) by mouth daily with supper. 90 tablet 1   No current facility-administered medications for this visit.     PHYSICAL EXAMINATION: ECOG PERFORMANCE STATUS: 2 - Symptomatic, <50% confined to bed  Vitals:   09/19/17 1015  BP: (!) 151/99  Pulse: 95  Resp: 20  Temp: 98.6 F (37 C)  SpO2: 98%   Filed Weights   09/19/17 1015  Weight: 154 lb 9.6 oz (70.1 kg)    GENERAL:alert, no distress and comfortable SKIN: skin color, texture, turgor are normal, no rashes or significant lesions EYES: normal, Conjunctiva are pink and non-injected, sclera clear OROPHARYNX:no exudate, no erythema and lips, buccal mucosa, and tongue normal  NECK: supple, thyroid normal size, non-tender, without nodularity LYMPH:  no palpable lymphadenopathy in the cervical, axillary or inguinal LUNGS: clear to auscultation and percussion with normal  breathing effort HEART: regular rate & rhythm and no murmurs and no lower extremity edema ABDOMEN:abdomen soft, non-tender and normal bowel sounds MUSCULOSKELETAL:no cyanosis of digits and no clubbing  NEURO: alert & oriented x 3 with fluent speech, numbness of the right distal upper extremity EXTREMITIES: No lower extremity edema  LABORATORY DATA:  I have reviewed the data as listed   Chemistry      Component Value Date/Time   NA 142 09/19/2017 0948   K 3.4 (L) 09/19/2017 0948   CL 104 04/23/2016 1551   CO2 25 09/19/2017 0948   BUN 4.3 (L) 09/19/2017 0948   CREATININE 0.7 09/19/2017 0948      Component Value Date/Time   CALCIUM 9.8 09/19/2017 0948   ALKPHOS 203 (H) 09/19/2017 0948   AST 42 (H) 09/19/2017 0948   ALT 32 09/19/2017 0948   BILITOT 0.41 09/19/2017 0948       Lab Results  Component Value Date   WBC 3.2 (L) 09/19/2017   HGB 11.0 (L) 09/19/2017   HCT 32.5 (L) 09/19/2017   MCV 90.2 09/19/2017   PLT 247 09/19/2017   NEUTROABS 2.3 09/19/2017    ASSESSMENT & PLAN:  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 Rt mastectomy 07/28/15: Multifocal IDC 2 cm, 1.5 cm. 0.6 cm; 3/13 LN positive, T1c N1 M0 stage IIA, ER/PR HER-2 negative. 2. Adjuvant XRT 09/03/15 to 10/13/15 3. Adjuvant Xeloda X 6 months (8 cycles) started 11/15/2015 completed July 2017 4. Carboplatin and gemcitabine started 03/14/2017 5. Severe right brachial plexus pain: Treated with palliative radiation with oral Xeloda concurrently  Current treatment: Halaven day 1 day 8 every 3 weeks started 09/12/2017, today cycle 1 day  8 ----------------------------------------------------------------------- Rt Axillary LN Biopsy: Triple negative right breast cancer [facility]: Poorly differentiated metastatic adenocarcinoma triple negative  PET/CT 07/28/2017:Progression of metastatic disease new liver lesion on 0.6 cm SUV 7.2, previous liver lesion 1.4 cm unchanged but shows no activity; scattered hypermetabolic bone lesions some of which are new, scattered pulmonary nodules stable hypermetabolic right subpectoral and mediastinal/internal mammary nodes stable  Halaven toxicities: Denies any nausea vomiting. Patient felt bit of tiredness and fatigue but it went away.  Return to clinic in 2 weeks for cycle 2   I spent 25 minutes talking to the patient of which more than half was spent in counseling and coordination of care.  No orders of the defined types were placed in this encounter.  The patient has a good understanding of the overall plan. she agrees with it. she will call with any problems that may develop before the next visit here.   Rulon Eisenmenger, MD 09/19/17

## 2017-09-19 NOTE — Assessment & Plan Note (Signed)
Right breast invasive ductal carcinoma multifocal, multicentric disease with at least 8 tumors, 2.3 cm, 1.6 cm of the biggest tumors in addition 6 more tumors 1 cm or less spanning 13.7 cm, right axillary lymph node biopsy positive (ER 22%); primary tumor is ER 0%, PR 0%, HER-2 negative, Ki-67 77% and 73% respectively, grade 3 T3 N1 M0 equals stage IIIa, Grade 3 1. Rt mastectomy 07/28/15: Multifocal IDC 2 cm, 1.5 cm. 0.6 cm; 3/13 LN positive, T1c N1 M0 stage IIA, ER/PR HER-2 negative. 2. Adjuvant XRT 09/03/15 to 10/13/15 3. Adjuvant Xeloda X 6 months (8 cycles) started 11/15/2015 completed July 2017 4. Carboplatin and gemcitabine started 03/14/2017  Current treatment: Halaven day 1 day 8 every 3 weeks started 09/12/2017, today cycle 1 day 8 ----------------------------------------------------------------------- Rt Axillary LN Biopsy: Triple negative right breast cancer Bronchoscopic Biopsy at [facility]: Poorly differentiated metastatic adenocarcinoma triple negative  PET/CT 07/28/2017:Progression of metastatic disease new liver lesion on 0.6 cm SUV 7.2, previous liver lesion 1.4 cm unchanged but shows no activity; scattered hypermetabolic bone lesions some of which are new, scattered pulmonary nodules stable hypermetabolic right subpectoral and mediastinal/internal mammary nodes stable  Severe right brachial plexus pain: Treated with palliative radiation with oral Xeloda concurrently  Halaven toxicities:  Return to clinic in 2 weeks for cycle 2 

## 2017-09-19 NOTE — Patient Instructions (Signed)
[facility] Discharge Instructions for Patients Receiving Chemotherapy  Today you received the following chemotherapy agents: Eribulin.  To help prevent nausea and vomiting after your treatment, we encourage you to take your nausea medication as directed.   If you develop nausea and vomiting that is not controlled by your nausea medication, call the clinic.   BELOW ARE SYMPTOMS THAT SHOULD BE REPORTED IMMEDIATELY:  *FEVER GREATER THAN 100.5 F  *CHILLS WITH OR WITHOUT FEVER  NAUSEA AND VOMITING THAT IS NOT CONTROLLED WITH YOUR NAUSEA MEDICATION  *UNUSUAL SHORTNESS OF BREATH  *UNUSUAL BRUISING OR BLEEDING  TENDERNESS IN MOUTH AND THROAT WITH OR WITHOUT PRESENCE OF ULCERS  *URINARY PROBLEMS  *BOWEL PROBLEMS  UNUSUAL RASH Items with * indicate a potential emergency and should be followed up as soon as possible.  Feel free to call the clinic should you have any questions or concerns. The clinic phone number is [phone].  Please show the CHEMO ALERT CARD at check-in to the Emergency Department and triage nurse.   

## 2017-09-24 NOTE — Progress Notes (Signed)
  Radiation Oncology         [phone] ________________________________  Name: Phyllis Pruitt MRN: [id number]  Date: 07/31/2017  DOB: 1970-12-13  SIMULATION AND TREATMENT PLANNING NOTE  DIAGNOSIS:     ICD-10-CM   1. Malignant neoplasm of upper-outer quadrant of right breast in female, estrogen receptor negative ([place]) C50.411    Z17.1      Site:  Right axilla  NARRATIVE:  The patient was brought to the [facility].  Identity was confirmed.  All relevant records and images related to the planned course of therapy were reviewed.   Written consent to proceed with treatment was confirmed which was freely given after reviewing the details related to the planned course of therapy had been reviewed with the patient.  Then, the patient was set-up in a stable reproducible  supine position for radiation therapy.  CT images were obtained.  Surface markings were placed.    Medically necessary complex treatment device(s) for immobilization:  Vac-lock bag.   The CT images were loaded into the planning software.  Then the target and avoidance structures were contoured.  Treatment planning then occurred.  The radiation prescription was entered and confirmed.  A total of 4 complex treatment devices were fabricated which relate to the designed radiation treatment fields. Each of these customized fields/ complex treatment devices will be used on a daily basis during the radiation course. I have requested : 3D Simulation  I have requested a DVH of the following structures: target volume, lungs, cord.   PLAN:  The patient will receive 5 Gy in 2 fractions followed by 24 fractions in 16 fractions.  ________________________________   Jodelle Gross, MD, PhD

## 2017-09-24 NOTE — Progress Notes (Signed)
  Radiation Oncology         [phone] ________________________________  Name: Phyllis Pruitt MRN: [id number]  Date: 07/31/2017  DOB: 11/26/1970  Optical Surface Tracking Plan:  Since intensity modulated radiotherapy (IMRT) and 3D conformal radiation treatment methods are predicated on accurate and precise positioning for treatment, intrafraction motion monitoring is medically necessary to ensure accurate and safe treatment delivery.  The ability to quantify intrafraction motion without excessive ionizing radiation dose can only be performed with optical surface tracking. Accordingly, surface imaging offers the opportunity to obtain 3D measurements of patient position throughout IMRT and 3D treatments without excessive radiation exposure.  I am ordering optical surface tracking for this patient's upcoming course of radiotherapy. ________________________________  Kyung Rudd, MD 09/24/2017 3:28 PM    Reference:   Particia Jasper, et al. Surface imaging-based analysis of intrafraction motion for breast radiotherapy patients.Journal of [place], n. 6, nov. 2014. ISSN [id number].   Available at: <http://www.jacmp.org/index.php/jacmp/article/view/4957>.

## 2017-09-25 ENCOUNTER — Telehealth: Payer: Self-pay | Admitting: *Deleted

## 2017-09-25 NOTE — Telephone Encounter (Signed)
Received call from patient stating she found a quarter size lump in her right axilla that is sore.  Apt. Made at [facility] for u/s today 09/25/17 at 3pm. Patient aware.

## 2017-10-03 ENCOUNTER — Ambulatory Visit (HOSPITAL_BASED_OUTPATIENT_CLINIC_OR_DEPARTMENT_OTHER): Payer: 59

## 2017-10-03 ENCOUNTER — Other Ambulatory Visit (HOSPITAL_BASED_OUTPATIENT_CLINIC_OR_DEPARTMENT_OTHER): Payer: 59

## 2017-10-03 ENCOUNTER — Encounter: Payer: Self-pay | Admitting: Adult Health

## 2017-10-03 ENCOUNTER — Ambulatory Visit (HOSPITAL_BASED_OUTPATIENT_CLINIC_OR_DEPARTMENT_OTHER): Payer: 59 | Admitting: Adult Health

## 2017-10-03 ENCOUNTER — Telehealth: Payer: Self-pay

## 2017-10-03 DIAGNOSIS — C773 Secondary and unspecified malignant neoplasm of axilla and upper limb lymph nodes: Secondary | ICD-10-CM

## 2017-10-03 DIAGNOSIS — C7951 Secondary malignant neoplasm of bone: Secondary | ICD-10-CM

## 2017-10-03 DIAGNOSIS — Z171 Estrogen receptor negative status [ER-]: Principal | ICD-10-CM

## 2017-10-03 DIAGNOSIS — C787 Secondary malignant neoplasm of liver and intrahepatic bile duct: Secondary | ICD-10-CM | POA: Diagnosis not present

## 2017-10-03 DIAGNOSIS — C50411 Malignant neoplasm of upper-outer quadrant of right female breast: Secondary | ICD-10-CM

## 2017-10-03 DIAGNOSIS — R530 Neoplastic (malignant) related fatigue: Secondary | ICD-10-CM

## 2017-10-03 DIAGNOSIS — G893 Neoplasm related pain (acute) (chronic): Secondary | ICD-10-CM | POA: Diagnosis not present

## 2017-10-03 LAB — CBC WITH DIFFERENTIAL/PLATELET
BASO%: 0.3 % (ref 0.0–2.0)
BASOS ABS: 0 10*3/uL (ref 0.0–0.1)
EOS ABS: 0 10*3/uL (ref 0.0–0.5)
EOS%: 0.7 % (ref 0.0–7.0)
HEMATOCRIT: 32.9 % — AB (ref 34.8–46.6)
HEMOGLOBIN: 10.9 g/dL — AB (ref 11.6–15.9)
LYMPH%: 17.2 % (ref 14.0–49.7)
MCH: 29.2 pg (ref 25.1–34.0)
MCHC: 33.1 g/dL (ref 31.5–36.0)
MCV: 88.2 fL (ref 79.5–101.0)
MONO#: 0.8 10*3/uL (ref 0.1–0.9)
MONO%: 27.9 % — AB (ref 0.0–14.0)
NEUT%: 53.9 % (ref 38.4–76.8)
NEUTROS ABS: 1.6 10*3/uL (ref 1.5–6.5)
PLATELETS: 218 10*3/uL (ref 145–400)
RBC: 3.73 10*6/uL (ref 3.70–5.45)
RDW: 15.4 % — AB (ref 11.2–14.5)
WBC: 3 10*3/uL — ABNORMAL LOW (ref 3.9–10.3)
lymph#: 0.5 10*3/uL — ABNORMAL LOW (ref 0.9–3.3)

## 2017-10-03 LAB — COMPREHENSIVE METABOLIC PANEL
ALT: 21 U/L (ref 0–55)
AST: 28 U/L (ref 5–34)
Albumin: 3.4 g/dL — ABNORMAL LOW (ref 3.5–5.0)
Alkaline Phosphatase: 205 U/L — ABNORMAL HIGH (ref 40–150)
Anion Gap: 11 mEq/L (ref 3–11)
BUN: 5.7 mg/dL — AB (ref 7.0–26.0)
CHLORIDE: 105 meq/L (ref 98–109)
CO2: 24 mEq/L (ref 22–29)
Calcium: 9.5 mg/dL (ref 8.4–10.4)
Creatinine: 0.8 mg/dL (ref 0.6–1.1)
EGFR: >60 mL/min/{1.73_m2} (ref >60)
GLUCOSE: 110 mg/dL (ref 70–140)
POTASSIUM: 3.2 meq/L — AB (ref 3.5–5.1)
SODIUM: 140 meq/L (ref 136–145)
Total Bilirubin: 0.44 mg/dL (ref 0.20–1.20)
Total Protein: 7.3 g/dL (ref 6.4–8.3)

## 2017-10-03 MED ORDER — SODIUM CHLORIDE 0.9% FLUSH
10.0000 mL | INTRAVENOUS | Status: DC | PRN
  Administered 2017-10-03: 10 mL
  Filled 2017-10-03: qty 10

## 2017-10-03 MED ORDER — SODIUM CHLORIDE 0.9 % IV SOLN
1.4000 mg/m2 | Freq: Once | INTRAVENOUS | Status: AC
  Administered 2017-10-03: 2.6 mg via INTRAVENOUS
  Filled 2017-10-03: qty 5.2

## 2017-10-03 MED ORDER — HEPARIN SOD (PORK) LOCK FLUSH 100 UNIT/ML IV SOLN
500.0000 [IU] | Freq: Once | INTRAVENOUS | Status: AC | PRN
  Administered 2017-10-03: 500 [IU]
  Filled 2017-10-03: qty 5

## 2017-10-03 MED ORDER — SODIUM CHLORIDE 0.9 % IV SOLN
Freq: Once | INTRAVENOUS | Status: AC
  Administered 2017-10-03: 09:00:00 via INTRAVENOUS

## 2017-10-03 MED ORDER — PROCHLORPERAZINE MALEATE 10 MG PO TABS
ORAL_TABLET | ORAL | Status: AC
  Filled 2017-10-03: qty 1

## 2017-10-03 MED ORDER — PROCHLORPERAZINE MALEATE 10 MG PO TABS: 10.0000 mg | ORAL_TABLET | Freq: Once | ORAL | Status: DC

## 2017-10-03 NOTE — Progress Notes (Signed)
[facility] Follow up:    [name], MD [address] [place] [address]   DIAGNOSIS: Cancer Staging Breast cancer of upper-outer quadrant of right female breast [facility]) Staging form: Breast, AJCC 7th Edition - Clinical stage from 02/08/2015: Stage IIIA (T3, N1, M0) - Unsigned - Pathologic stage from 07/28/2015: Stage IIA (yT1c, N1a, cM0) - Unsigned   SUMMARY OF ONCOLOGIC HISTORY:   Breast cancer of upper-outer quadrant of right female breast ([place])   01/19/2015 Mammogram    Right breast: density with indistinct margin at 12:00, posterior depth; also oval mass with obscured margin at 11:00, middle depth; irregular mass with indistinct margin central to nipple, middle depth      01/23/2015 Initial Biopsy    Right breast 9:00 and 10:00 biopsy ([facility]): Grade 3 IDC ER/PR negative, HER-2 negative, Ki-67 77% and 73%; the right axillary lymph node biopsy: ER 22% positive      01/30/2015 Breast MRI    Right breast masses 9 and 10:00 position, additional irregular masses with non-mass enhancement upper outer quadrant with one mass extending into upper inner quadrant, multifocal, multicentric 13.7 x 4.6 x 4.6 cm, malignant right axillary lymph node      02/08/2015 Clinical Stage    Stage IIIA: T3, N1, M0      02/09/2015 Procedure    BreastNext panel revealed VUS BRCA2 gene called p.[id number] (c.3749A>G); otherwise no clinically significant variant at ATM, BARD1, BRCA1, BRCA2, BRIP1, CDH1, CHEK2, MRE11A, MUTYH, NBN, NF1, PALB2, PTEN, RAD50, RAD51C, RAD51D, and TP53      02/15/2015 Imaging    No evidence of metastatic disease, 2.2 cm enhancing lesion anterior left lower kidney suspicious for solid renal neoplasm      02/20/2015 - 07/10/2015 Neo-Adjuvant Chemotherapy    dose dense doxorubicin and cyclophosphamide x 4 followed by Abraxane weekly x 12      07/10/2015 Breast MRI    Multiple irregular enhancing masses right breast are less confluent and smaller  dominant mass 10:00 2.1 x 1.9 x 1.3 cm; right breast 11:00 mass measures 1.2 x 1.2 x 0.8 cm, decreased right axillary lymph node thickening      07/28/2015 Definitive Surgery    Rt mastectomy: Multifocal IDC 2 cm, 1.5 cm. 0.6 cm; 3/13 LN positive, ER/PR HER-2      07/28/2015 Pathologic Stage    Stage IIA: T1c N1 M0        09/03/2015 - 10/13/2015 Radiation Therapy    Adjuvant radiation therapy: Right chest wall/50.4 Pearline Cables @ 1.8 Gray per fraction x 28 fractions. Right supraclavicular fossa 45 Gy '@1'$ .8 Gy per fraction x 25 fractions      11/15/2015 - 05/10/2016 Chemotherapy    Adjuvant capecitabine - completed 8 cycles      11/16/2015 Survivorship    Survivorship visit completed and copy of care plan given to patient      02/23/2016 Procedure    Left mandible biopsy: Scant material, no malignancy      05/21/2016 -  Anti-estrogen oral therapy    Patient did not take tamoxifen because her ER was weakly positive at 22% and she was worried about toxicities      02/21/2017 Imaging    Rt Axilla 8 X 7 X 7 mm mass inv pectoralis musculature, additional mass 9 mm 1.5 cm apart      02/21/2017 Relapse/Recurrence    Right axillary mass biopsy: 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      03/24/2017 Miscellaneous  Foundation 1 analysis: Microsatellite stable; TMB 9 muts/Mb; MYC amplification, PIK3C2B p717L; TP53 [id number]      03/25/2017 - 07/15/2017 Chemotherapy    Carboplatin and gemcitabine palliative chemotherapy      07/28/2017 PET scan    Progression of metastatic disease new liver lesion on 0.6 cm SUV 7.2, previous liver lesion 1.4 cm unchanged but shows no activity; scattered hypermetabolic bone lesions some of which are new, scattered pulmonary nodules stable hypermetabolic right subpectoral and mediastinal/internal mammary nodes stable      08/22/2017 - 09/10/2017 Radiation Therapy    Palliative radiation to brachial plexus concurrently with Xeloda      09/12/2017 -  Chemotherapy     Halaven day 1 day 8 every 3 weeks        Renal cell carcinoma of left kidney ([place])   01/29/2016 Surgery    Left renal mass partial resection: 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 female returns for evaluation of her metastatic breast cancer prior to receiving chemotherapy.  She is doing well today.  She continues to tolerate treatment well.  She does have pain in her right arm and is managing this with Gabapentin, Lyrica, and Oxycodone (at night).  Her bowels are working well.  She has no mouth sores.  She denies nausea and vomiting.  She endorses fatigue, and with her arm it limits her ability to do Zumba which she enjoys.  She denies peripheral neuropathy.     Patient Active Problem List   Diagnosis Date Noted  . Acquired absence of breast and nipple 06/03/2016  . Renal cell carcinoma of left kidney ([place]) 02/06/2016  . Left ovarian cyst 01/15/2016  . Renal mass 12/29/2015  . Chemotherapy-induced peripheral neuropathy ([place]) 08/04/2015  . Genetic testing 03/07/2015  . Left renal mass 02/17/2015  . Breast cancer of upper-outer quadrant of right female breast ([place]) 01/27/2015    is allergic to sulfa antibiotics.  MEDICAL HISTORY: Past Medical History:  Diagnosis Date  . Anemia    during pregnancy  . Breast cancer of upper-outer quadrant of right female breast ([place]) 01/27/2015  . Depression    hx denies any problems now  . History of kidney cancer 12/2015   left removed "spot"  . Lymphedema    rt arm  . Neuropathy   . Sickle cell trait ([place])     SURGICAL HISTORY: Past Surgical History:  Procedure Laterality Date  . LATISSIMUS FLAP TO BREAST Right 06/03/2016   Procedure: RIGHT LATISSIMUS FLAP TO BREAST;  Surgeon: Irene Limbo, MD;  Location: [facility];  Service: Plastics;  Laterality: Right;  .  LIPOSUCTION WITH LIPOFILLING Right 09/20/2016   Procedure: LIPOSUCTION WITH LIPOFILLING;  Surgeon: Irene Limbo, MD;  Location: [facility];  Service: Plastics;  Laterality: Right;  LIPOSUCTION WITH LIPOFILLING  . MASTECTOMY    . MASTECTOMY MODIFIED RADICAL Right 07/28/2015  . MASTECTOMY MODIFIED RADICAL Right 07/28/2015   Procedure: RIGHT MODIFIED RADICAL MASTECTOMY;  Surgeon: Alphonsa Overall, MD;  Location: [facility];  Service: General;  Laterality: Right;  . PORT-A-CATH REMOVAL  07/28/2015  . PORT-A-CATH REMOVAL  07/28/2015   Procedure: REMOVAL PORT-A-CATH;  Surgeon: Alphonsa Overall, MD;  Location: [facility];  Service: General;;  . PORTACATH PLACEMENT N/A 02/10/2015   Procedure: INSERTION PORT-A-CATH WITH ULTRA SOUND, left subclavian,;  Surgeon: Alphonsa Overall, MD;  Location: [facility];  Service: General;  Laterality: N/A;  . PORTACATH PLACEMENT N/A 04/04/2017   Procedure: POWER PORT PLACEMENT;  Surgeon: Alphonsa Overall, MD;  Location: [facility];  Service: General;  Laterality: N/A;  . REMOVAL OF TISSUE EXPANDER AND PLACEMENT OF IMPLANT Right 09/20/2016   Procedure: REMOVAL OF RIGHT TISSUE EXPANDER WITH PLACEMENT OF RIGHT SILICONE BREAST IMPLANTS LIPOFILLING FROM ABDOMEN TO RIGHT CHEST;  Surgeon: Irene Limbo, MD;  Location: [facility];  Service: Plastics;  Laterality: Right;  PLACEMENT OF RIGHT SILICONE BREAST IMPLANTS LIPOFILLING FROM ABDOMEN TO RIGHT CHEST  . ROBOTIC ASSITED PARTIAL NEPHRECTOMY Left 12/29/2015   Procedure: XI ROBOTIC ASSITED PARTIAL NEPHRECTOMY;  Surgeon: Cleon Gustin, MD;  Location: [facility];  Service: Urology;  Laterality: Left;  . TISSUE EXPANDER PLACEMENT Right 06/03/2016   Procedure: PLACEMENT RIGHT TISSUE EXPANDER;  Surgeon: Irene Limbo, MD;  Location: [facility];  Service: Plastics;  Laterality: Right;  . TONSILLECTOMY    . TUBAL LIGATION    . wisdome teeth extraction      SOCIAL HISTORY: Social History   Socioeconomic History  . Marital status:  Married    Spouse name: Not on file  . Number of children: Not on file  . Years of education: Not on file  . Highest education level: Not on file  Social Needs  . Financial resource strain: Not on file  . Food insecurity - worry: Not on file  . Food insecurity - inability: Not on file  . Transportation needs - medical: Not on file  . Transportation needs - non-medical: Not on file  Occupational History  . Not on file  Tobacco Use  . Smoking status: Never Smoker  . Smokeless tobacco: Never Used  Substance and Sexual Activity  . Alcohol use: No  . Drug use: No  . Sexual activity: Not on file  Other Topics Concern  . Not on file  Social History Narrative  . Not on file    FAMILY HISTORY: No family history on file.  Review of Systems  Constitutional: Positive for fatigue (see interval history). Negative for appetite change, chills, fever and unexpected weight change.  HENT:   Negative for hearing loss, lump/mass and mouth sores.   Eyes: Negative for eye problems and icterus.  Respiratory: Negative for chest tightness, cough and shortness of breath.   Cardiovascular: Negative for chest pain, leg swelling and palpitations.  Gastrointestinal: Negative for abdominal distention, abdominal pain, constipation, diarrhea, nausea and vomiting.  Endocrine: Negative for hot flashes.  Musculoskeletal: Negative for arthralgias.  Skin: Negative for itching and rash.  Neurological: Negative for dizziness and headaches.  Hematological: Negative for adenopathy. Does not bruise/bleed easily.  Psychiatric/Behavioral: Negative for depression. The patient is not nervous/anxious.       PHYSICAL EXAMINATION  ECOG PERFORMANCE STATUS: 1 - Symptomatic but completely ambulatory  Vitals:   10/03/17 0817  BP: 140/78  Pulse: (!) 102  Resp: 18  Temp: 98.4 F (36.9 C)  SpO2: 99%    Physical Exam  Constitutional: She is oriented to person, place, and time and well-developed, well-nourished, and  in no distress.  HENT:  Head: Normocephalic and atraumatic.  Mouth/Throat: Oropharynx is clear and moist. No oropharyngeal exudate.  Eyes: Pupils are equal, round, and reactive to light. No scleral icterus.  Neck: Neck supple.  Cardiovascular: Normal rate, regular rhythm and normal heart sounds.  Pulmonary/Chest: Effort normal and breath sounds normal. No respiratory distress. She has no wheezes. She  has no rales. She exhibits no tenderness.  Abdominal: Soft. Bowel sounds are normal. She exhibits no distension and no mass. There is no tenderness. There is no rebound and no guarding.  Musculoskeletal: She exhibits no edema.  Lymphadenopathy:    She has no cervical adenopathy.  Neurological: She is alert and oriented to person, place, and time.  Skin: Skin is warm and dry. No rash noted.  Psychiatric: Mood and affect normal.    LABORATORY DATA:  CBC    Component Value Date/Time   WBC 3.0 (L) 10/03/2017 0805   WBC 8.3 04/23/2016 1551   RBC 3.73 10/03/2017 0805   RBC 3.80 (L) 04/23/2016 1551   HGB 10.9 (L) 10/03/2017 0805   HCT 32.9 (L) 10/03/2017 0805   PLT 218 10/03/2017 0805   MCV 88.2 10/03/2017 0805   MCH 29.2 10/03/2017 0805   MCH 27.4 04/23/2016 1551   MCHC 33.1 10/03/2017 0805   MCHC 33.1 04/23/2016 1551   RDW 15.4 (H) 10/03/2017 0805   LYMPHSABS 0.5 (L) 10/03/2017 0805   MONOABS 0.8 10/03/2017 0805   EOSABS 0.0 10/03/2017 0805   BASOSABS 0.0 10/03/2017 0805    CMP     Component Value Date/Time   NA 140 10/03/2017 0805   K 3.2 (L) 10/03/2017 0805   CL 104 04/23/2016 1551   CO2 24 10/03/2017 0805   GLUCOSE 110 10/03/2017 0805   BUN 5.7 (L) 10/03/2017 0805   CREATININE 0.8 10/03/2017 0805   CALCIUM 9.5 10/03/2017 0805   PROT 7.3 10/03/2017 0805   ALBUMIN 3.4 (L) 10/03/2017 0805   AST 28 10/03/2017 0805   ALT 21 10/03/2017 0805   ALKPHOS 205 (H) 10/03/2017 0805   BILITOT 0.44 10/03/2017 0805   GFRNONAA >60 04/23/2016 1551   GFRAA >60 04/23/2016 1551         ASSESSMENT and PLAN:   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 Rt mastectomy 07/28/15: Multifocal IDC 2 cm, 1.5 cm. 0.6 cm; 3/13 LN positive, T1c N1 M0 stage IIA, ER/PR HER-2 negative. 2. Adjuvant XRT 09/03/15 to 10/13/15 3. Adjuvant Xeloda X 6 months (8 cycles) started 11/15/2015 completed July 2017 4. Carboplatin and gemcitabine started 03/14/2017  Current treatment: Halaven day 1 day 8 every 3 weeks started 09/12/2017, today cycle 2 day 1 ----------------------------------------------------------------------- Rt Axillary LN Biopsy: Triple negative right breast cancer [facility] at [facility]: Poorly differentiated metastatic adenocarcinoma triple negative  PET/CT 07/28/2017:Progression of metastatic disease new liver lesion on 0.6 cm SUV 7.2, previous liver lesion 1.4 cm unchanged but shows no activity; scattered hypermetabolic bone lesions some of which are new, scattered pulmonary nodules stable hypermetabolic right subpectoral and mediastinal/internal mammary nodes stable  Severe right brachial plexus pain: Treated with palliative radiation with oral Xeloda concurrently  Halaven toxicities:  Maur[place] is fatigued.  She is otherwise doing well.  I reviewed her CBC with her which is stable.  CMET pending.  She will proceed with Eribulin today (assuming the CMET is normal).  She will continue her current pain regimen.  She will return in 1 week for labs and Halaven, and in 3  weeks for labs, f/u with Dr. [name] and Sherald Hess.    All questions were answered. The patient knows to call the clinic with  any problems, questions or concerns. We can certainly see the patient much sooner if necessary.  A total of (30) minutes of face-to-face time was spent with this patient with greater than 50% of that time in counseling and care-coordination.  This note was electronically signed. Scot Dock, NP 10/03/2017

## 2017-10-03 NOTE — Telephone Encounter (Signed)
Left a message for patient to call center for lab results and potassium dosage.

## 2017-10-03 NOTE — Assessment & Plan Note (Signed)
Right breast invasive ductal carcinoma multifocal, multicentric disease with at least 8 tumors, 2.3 cm, 1.6 cm of the biggest tumors in addition 6 more tumors 1 cm or less spanning 13.7 cm, right axillary lymph node biopsy positive (ER 22%); primary tumor is ER 0%, PR 0%, HER-2 negative, Ki-67 77% and 73% respectively, grade 3 T3 N1 M0 equals stage IIIa, Grade 3 1. Rt mastectomy 07/28/15: Multifocal IDC 2 cm, 1.5 cm. 0.6 cm; 3/13 LN positive, T1c N1 M0 stage IIA, ER/PR HER-2 negative. 2. Adjuvant XRT 09/03/15 to 10/13/15 3. Adjuvant Xeloda X 6 months (8 cycles) started 11/15/2015 completed July 2017 4. Carboplatin and gemcitabine started 03/14/2017  Current treatment: Halaven day 1 day 8 every 3 weeks started 09/12/2017, today cycle 2 day 1 ----------------------------------------------------------------------- Rt Axillary LN Biopsy: Triple negative right breast cancer Bron[facility]oscopic Biopsy at [facility]: Poorly differentiated metastatic adenocarcinoma triple negative  PET/CT 07/28/2017:Progression of metastatic disease new liver lesion on 0.6 cm SUV 7.2, previous liver lesion 1.4 cm unchanged but shows no activity; scattered hypermetabolic bone lesions some of which are new, scattered pulmonary nodules stable hypermetabolic right subpectoral and mediastinal/internal mammary nodes stable  Severe right brachial plexus pain: Treated with palliative radiation with oral Xeloda concurrently  Halaven toxicities:  Phyllis Pruitt is fatigued.  She is otherwise doing well.  I reviewed her CBC with her which is stable.  CMET pending.  She will proceed with Eribulin today (assuming the CMET is normal).  She will continue her current pain regimen.  She will return in 1 week for labs and Halaven, and in 3 weeks for labs, f/u with Dr. [name] and Sherald Hess.

## 2017-10-03 NOTE — Telephone Encounter (Signed)
-----   Message from Gardenia Phlegm, NP sent at 10/03/2017  3:38 PM EST ----- P[lease call patient and see if she is taking potassium daily.  Her potassium is slightly lower.  If so, would recommend she take potassium twice a day for a couple of days, then go back to daily.   ----- Message ----- From: Interface, Lab In [facility] One Sent: 10/03/2017   8:11 AM To: Nicholas Lose, MD

## 2017-10-03 NOTE — Telephone Encounter (Signed)
Returned pt call regarding lab results. Informed pt of her potassium level of 3.2. Instructed her per Wilber Bihari, NP to take 2 tablets of potassium daily for 3 days and then go back to her 1 tablet once daily. Pt has good understanding of instructions. Will call with any other concerns.  Cyndia Bent RN

## 2017-10-03 NOTE — Patient Instructions (Signed)
[facility] Discharge Instructions for Patients Receiving Chemotherapy  Today you received the following chemotherapy agents Halaven  To help prevent nausea and vomiting after your treatment, we encourage you to take your nausea medication as directed If you develop nausea and vomiting that is not controlled by your nausea medication, call the clinic.   BELOW ARE SYMPTOMS THAT SHOULD BE REPORTED IMMEDIATELY:  *FEVER GREATER THAN 100.5 F  *CHILLS WITH OR WITHOUT FEVER  NAUSEA AND VOMITING THAT IS NOT CONTROLLED WITH YOUR NAUSEA MEDICATION  *UNUSUAL SHORTNESS OF BREATH  *UNUSUAL BRUISING OR BLEEDING  TENDERNESS IN MOUTH AND THROAT WITH OR WITHOUT PRESENCE OF ULCERS  *URINARY PROBLEMS  *BOWEL PROBLEMS  UNUSUAL RASH Items with * indicate a potential emergency and should be followed up as soon as possible.  Feel free to call the clinic should you have any questions or concerns. The clinic phone number is [phone].  Please show the CHEMO ALERT CARD at check-in to the Emergency Department and triage nurse.   

## 2017-10-08 ENCOUNTER — Other Ambulatory Visit: Payer: Self-pay | Admitting: Hematology and Oncology

## 2017-10-10 ENCOUNTER — Other Ambulatory Visit (HOSPITAL_BASED_OUTPATIENT_CLINIC_OR_DEPARTMENT_OTHER): Payer: 59

## 2017-10-10 ENCOUNTER — Ambulatory Visit (HOSPITAL_BASED_OUTPATIENT_CLINIC_OR_DEPARTMENT_OTHER): Payer: 59

## 2017-10-10 VITALS — BP 147/94 | HR 97 | Temp 98.1°F | Resp 18

## 2017-10-10 DIAGNOSIS — C787 Secondary malignant neoplasm of liver and intrahepatic bile duct: Secondary | ICD-10-CM | POA: Diagnosis not present

## 2017-10-10 DIAGNOSIS — C7951 Secondary malignant neoplasm of bone: Secondary | ICD-10-CM | POA: Diagnosis not present

## 2017-10-10 DIAGNOSIS — C50411 Malignant neoplasm of upper-outer quadrant of right female breast: Secondary | ICD-10-CM | POA: Diagnosis not present

## 2017-10-10 DIAGNOSIS — Z171 Estrogen receptor negative status [ER-]: Principal | ICD-10-CM

## 2017-10-10 LAB — CBC WITH DIFFERENTIAL/PLATELET
BASO%: 1.1 % (ref 0.0–2.0)
BASOS ABS: 0 10*3/uL (ref 0.0–0.1)
EOS ABS: 0 10*3/uL (ref 0.0–0.5)
EOS%: 0 % (ref 0.0–7.0)
HCT: 32.1 % — ABNORMAL LOW (ref 34.8–46.6)
HEMOGLOBIN: 10.7 g/dL — AB (ref 11.6–15.9)
LYMPH%: 19.4 % (ref 14.0–49.7)
MCH: 29.2 pg (ref 25.1–34.0)
MCHC: 33.3 g/dL (ref 31.5–36.0)
MCV: 87.7 fL (ref 79.5–101.0)
MONO#: 0.2 10*3/uL (ref 0.1–0.9)
MONO%: 4.2 % (ref 0.0–14.0)
NEUT%: 75.3 % (ref 38.4–76.8)
NEUTROS ABS: 2.7 10*3/uL (ref 1.5–6.5)
PLATELETS: 272 10*3/uL (ref 145–400)
RBC: 3.66 10*6/uL — ABNORMAL LOW (ref 3.70–5.45)
RDW: 15.1 % — ABNORMAL HIGH (ref 11.2–14.5)
WBC: 3.6 10*3/uL — AB (ref 3.9–10.3)
lymph#: 0.7 10*3/uL — ABNORMAL LOW (ref 0.9–3.3)

## 2017-10-10 LAB — COMPREHENSIVE METABOLIC PANEL
ALBUMIN: 3.4 g/dL — AB (ref 3.5–5.0)
ALK PHOS: 208 U/L — AB (ref 40–150)
ALT: 20 U/L (ref 0–55)
ANION GAP: 10 meq/L (ref 3–11)
AST: 29 U/L (ref 5–34)
BILIRUBIN TOTAL: 0.31 mg/dL (ref 0.20–1.20)
BUN: 7 mg/dL (ref 7.0–26.0)
CO2: 24 mEq/L (ref 22–29)
Calcium: 9.6 mg/dL (ref 8.4–10.4)
Chloride: 107 mEq/L (ref 98–109)
Creatinine: 0.8 mg/dL (ref 0.6–1.1)
Glucose: 115 mg/dl (ref 70–140)
POTASSIUM: 3.4 meq/L — AB (ref 3.5–5.1)
Sodium: 142 mEq/L (ref 136–145)
TOTAL PROTEIN: 7.6 g/dL (ref 6.4–8.3)

## 2017-10-10 MED ORDER — SODIUM CHLORIDE 0.9 % IV SOLN
1.4000 mg/m2 | Freq: Once | INTRAVENOUS | Status: AC
  Administered 2017-10-10: 2.6 mg via INTRAVENOUS
  Filled 2017-10-10: qty 5.2

## 2017-10-10 MED ORDER — SODIUM CHLORIDE 0.9% FLUSH
10.0000 mL | INTRAVENOUS | Status: DC | PRN
  Administered 2017-10-10: 10 mL
  Filled 2017-10-10: qty 10

## 2017-10-10 MED ORDER — SODIUM CHLORIDE 0.9 % IV SOLN
Freq: Once | INTRAVENOUS | Status: AC
  Administered 2017-10-10: 10:00:00 via INTRAVENOUS

## 2017-10-10 MED ORDER — HEPARIN SOD (PORK) LOCK FLUSH 100 UNIT/ML IV SOLN
500.0000 [IU] | Freq: Once | INTRAVENOUS | Status: AC | PRN
  Administered 2017-10-10: 500 [IU]
  Filled 2017-10-10: qty 5

## 2017-10-10 MED ORDER — PROCHLORPERAZINE MALEATE 10 MG PO TABS: 10.0000 mg | ORAL_TABLET | Freq: Once | ORAL | Status: DC

## 2017-10-10 NOTE — Patient Instructions (Signed)
[facility] Discharge Instructions for Patients Receiving Chemotherapy  Today you received the following chemotherapy agent:  Halaven.  To help prevent nausea and vomiting after your treatment, we encourage you to take your nausea medication as directed.   If you develop nausea and vomiting that is not controlled by your nausea medication, call the clinic.   BELOW ARE SYMPTOMS THAT SHOULD BE REPORTED IMMEDIATELY:  *FEVER GREATER THAN 100.5 F  *CHILLS WITH OR WITHOUT FEVER  NAUSEA AND VOMITING THAT IS NOT CONTROLLED WITH YOUR NAUSEA MEDICATION  *UNUSUAL SHORTNESS OF BREATH  *UNUSUAL BRUISING OR BLEEDING  TENDERNESS IN MOUTH AND THROAT WITH OR WITHOUT PRESENCE OF ULCERS  *URINARY PROBLEMS  *BOWEL PROBLEMS  UNUSUAL RASH Items with * indicate a potential emergency and should be followed up as soon as possible.  Feel free to call the clinic should you have any questions or concerns. The clinic phone number is [phone].  Please show the [place] at check-in to the Emergency Department and triage nurse.

## 2017-10-16 ENCOUNTER — Telehealth: Payer: Self-pay | Admitting: *Deleted

## 2017-10-16 NOTE — Telephone Encounter (Signed)
Called patient to alter fu appt. Per Phyllis Pruitt, spoke with patient and appt. rescheduled for 10-23-17 @ 9:30 am, patient agreed to new appt. date and time

## 2017-10-22 ENCOUNTER — Inpatient Hospital Stay: Admission: RE | Admit: 2017-10-22 | Payer: Self-pay | Source: Ambulatory Visit | Admitting: Radiation Oncology

## 2017-10-23 ENCOUNTER — Ambulatory Visit
Admission: RE | Admit: 2017-10-23 | Discharge: 2017-10-23 | Disposition: A | Discharge status: Home / Self Care | Payer: 59 | Source: Ambulatory Visit | Attending: Radiation Oncology | Admitting: Radiation Oncology

## 2017-10-23 ENCOUNTER — Encounter: Payer: Self-pay | Admitting: Radiation Oncology

## 2017-10-23 VITALS — BP 140/97 | HR 95 | Temp 98.1°F | Resp 18 | Ht 65.0 in | Wt 152.6 lb

## 2017-10-23 DIAGNOSIS — Z923 Personal history of irradiation: Secondary | ICD-10-CM | POA: Diagnosis not present

## 2017-10-23 DIAGNOSIS — C50911 Malignant neoplasm of unspecified site of right female breast: Secondary | ICD-10-CM | POA: Diagnosis present

## 2017-10-23 DIAGNOSIS — Z882 Allergy status to sulfonamides status: Secondary | ICD-10-CM | POA: Insufficient documentation

## 2017-10-23 DIAGNOSIS — I890 Lymphedema, not elsewhere classified: Secondary | ICD-10-CM | POA: Insufficient documentation

## 2017-10-23 DIAGNOSIS — R200 Anesthesia of skin: Secondary | ICD-10-CM | POA: Insufficient documentation

## 2017-10-23 DIAGNOSIS — Z171 Estrogen receptor negative status [ER-]: Secondary | ICD-10-CM | POA: Insufficient documentation

## 2017-10-23 DIAGNOSIS — Z79891 Long term (current) use of opiate analgesic: Secondary | ICD-10-CM | POA: Diagnosis not present

## 2017-10-23 DIAGNOSIS — Y842 Radiological procedure and radiotherapy as the cause of abnormal reaction of the patient, or of later complication, without mention of misadventure at the time of the procedure: Secondary | ICD-10-CM | POA: Insufficient documentation

## 2017-10-23 DIAGNOSIS — Z7901 Long term (current) use of anticoagulants: Secondary | ICD-10-CM | POA: Insufficient documentation

## 2017-10-23 DIAGNOSIS — Z79899 Other long term (current) drug therapy: Secondary | ICD-10-CM | POA: Insufficient documentation

## 2017-10-23 DIAGNOSIS — C50411 Malignant neoplasm of upper-outer quadrant of right female breast: Secondary | ICD-10-CM

## 2017-10-23 NOTE — Assessment & Plan Note (Signed)
Right breast invasive ductal carcinoma multifocal, multicentric disease with at least 8 tumors, 2.3 cm, 1.6 cm of the biggest tumors in addition 6 more tumors 1 cm or less spanning 13.7 cm, right axillary lymph node biopsy positive (ER 22%); primary tumor is ER 0%, PR 0%, HER-2 negative, Ki-67 77% and 73% respectively, grade 3 T3 N1 M0 equals stage IIIa, Grade 3 1. Rt mastectomy 07/28/15: Multifocal IDC 2 cm, 1.5 cm. 0.6 cm; 3/13 LN positive, T1c N1 M0 stage IIA, ER/PR HER-2 negative. 2. Adjuvant XRT 09/03/15 to 10/13/15 3. Adjuvant Xeloda X 6 months (8 cycles) started 11/15/2015 completed July 2017 4. Carboplatin and gemcitabine started 03/14/2017  Current treatment: Halaven day 1 day 8 every 3 weeks started 09/12/2017, today cycle 1 day 8 ----------------------------------------------------------------------- Rt Axillary LN Biopsy: Triple negative right breast cancer Bron[facility]oscopic Biopsy at [facility]: Poorly differentiated metastatic adenocarcinoma triple negative  PET/CT 07/28/2017:Progression of metastatic disease new liver lesion on 0.6 cm SUV 7.2, previous liver lesion 1.4 cm unchanged but shows no activity; scattered hypermetabolic bone lesions some of which are new, scattered pulmonary nodules stable hypermetabolic right subpectoral and mediastinal/internal mammary nodes stable  Severe right brachial plexus pain: Treated with palliative radiation with oral Xeloda concurrently  Current Treatment: Halaven cycle 3 day 1  Halaven toxicities:  Plan to get scans after this round. Return to clinic in 2 weeks for cycle 2

## 2017-10-23 NOTE — Addendum Note (Signed)
Encounter addended by: Malena Edman, RN on: 10/23/2017 10:32 AM  Actions taken: Charge Capture section accepted

## 2017-10-23 NOTE — Progress Notes (Signed)
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  Interval Since Last Radiation:  6 weeks   08/21/17 - 09/10/17: Right Axilla     24 Gy in 16 fx                        5 Gy in 2 fx  09/03/15-10/13/15: Right chest wall/50.4 Pearline Cables @ 1.8 Pearline Cables per fraction x 28 fractions Right supraclavicular fossa 45 Gy @1 .8 Gy per fraction x 25 fractions   Narrative:  The patient returns today for routine follow-up. During treatment she did very well with radiotherapy and did not have significant desquamation or skin issues.     On review of systems, the patient states she is doing much better and that her upper shoulder and upper arm pain has resolved. She reports that she has some numbness and tingling but no significant complaints of neuropathy are noted today and she is taking gabapentin. She is concerned about edema in the right hand which has been stable, however she is unable to wear compression devices.  She continues to work with physical therapy every week.  She reports her range of motion is also limited in the shoulder region, and also continues to work with physical therapy regarding this.  She does have intact sensation of the right upper extremity.  ALLERGIES:  is allergic to sulfa antibiotics.  Meds: Current Outpatient Medications  Medication Sig Dispense Refill  . benazepril (LOTENSIN) 10 MG tablet Take 1 tablet (10 mg total) by mouth daily.    . Black Pepper-Turmeric 3-500 MG CAPS Take 1 capsule by mouth daily.    Marland Kitchen gabapentin (NEURONTIN) 300 MG capsule Take 1 capsule (300 mg total) by mouth 3 (three) times daily. 90 capsule 3  . lidocaine-prilocaine (EMLA) cream Apply to affected area once 30 g 3  .  Multiple Vitamins-Minerals (MULTIVITAMIN) tablet Take 1 tablet by mouth daily.    Marland Kitchen oxyCODONE (OXY IR/ROXICODONE) 5 MG immediate release tablet Take 1 tablet (5 mg total) by mouth every 4 (four) hours as needed for severe pain. 90 tablet 0  . potassium chloride SA (K-DUR,KLOR-CON) 20 MEQ tablet TAKE 1 TABLET(20 MEQ) BY MOUTH DAILY 30 tablet 0  . Probiotic Product (PROBIOTIC PO) Take 1 tablet by mouth daily.    . rivaroxaban (XARELTO) 20 MG TABS tablet Take 1 tablet (20 mg total) by mouth daily with supper. 90 tablet 1   No current facility-administered medications for this encounter.     Physical Findings:  height is 5\' 5"  (1.651 m) and weight is 152 lb 9.6 oz (69.2 kg). Her oral temperature is 98.1 F (36.7 C). Her blood pressure is 140/97 (abnormal) and her pulse is 95. Her respiration is 18 and oxygen saturation is 100%.  Pain Assessment Pain Score: 5  Pain Loc: Hand(Right hand)/10 In general this is a well appearing African American female in no acute distress. She's alert and oriented x4 and appropriate throughout the examination. Cardiopulmonary assessment is negative for acute distress and she exhibits normal effort. The right upper extremity is evaluated and there is 2+ pitting edema of the right hand extending into the digits.  No erythema is identified.  She has  limited range of motion of her MIP and PIP joints.  This is secondary to edema.  She has adequate pulse at the radial and brachial sites in the right upper extremity.  The edema does not extend step cephalad past the wrist.  She has intact  intact sensory perception.  Lab Findings: Lab Results  Component Value Date   WBC 3.6 (L) 10/10/2017   HGB 10.7 (L) 10/10/2017   HCT 32.1 (L) 10/10/2017   MCV 87.7 10/10/2017   PLT 272 10/10/2017     Radiographic Findings: No results found.  Impression/Plan: 1. 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 The patient has been doing  well since completion of radiotherapy. We discussed that we would be happy to continue to follow her as needed, but she will also continue to follow up with Dr. [name] in medical oncology. 2. Lymphedema of the right upper extremity.  The patient will continue to follow-up with physical therapy for this, I encouraged her to try using a light Ace wrap to encourage lymphatic drainage and continuing to perform manual massage. 3. Decreased range of motion in hands and shoulder.  The patient continues to work with physical therapy.  I encouraged her to ask them for guidance as to whether or not she would benefit from an evaluation with orthopedics.      Carola Rhine, PAC

## 2017-10-24 ENCOUNTER — Inpatient Hospital Stay: Payer: 59

## 2017-10-24 ENCOUNTER — Inpatient Hospital Stay: Payer: 59 | Admitting: Hematology and Oncology

## 2017-10-24 ENCOUNTER — Inpatient Hospital Stay: Payer: 59 | Attending: Hematology and Oncology

## 2017-10-24 DIAGNOSIS — Z7901 Long term (current) use of anticoagulants: Secondary | ICD-10-CM

## 2017-10-24 DIAGNOSIS — M79641 Pain in right hand: Secondary | ICD-10-CM | POA: Insufficient documentation

## 2017-10-24 DIAGNOSIS — R200 Anesthesia of skin: Secondary | ICD-10-CM | POA: Insufficient documentation

## 2017-10-24 DIAGNOSIS — Z5111 Encounter for antineoplastic chemotherapy: Secondary | ICD-10-CM | POA: Diagnosis present

## 2017-10-24 DIAGNOSIS — C787 Secondary malignant neoplasm of liver and intrahepatic bile duct: Secondary | ICD-10-CM | POA: Diagnosis not present

## 2017-10-24 DIAGNOSIS — C7951 Secondary malignant neoplasm of bone: Secondary | ICD-10-CM | POA: Diagnosis not present

## 2017-10-24 DIAGNOSIS — Z171 Estrogen receptor negative status [ER-]: Secondary | ICD-10-CM | POA: Insufficient documentation

## 2017-10-24 DIAGNOSIS — C773 Secondary and unspecified malignant neoplasm of axilla and upper limb lymph nodes: Secondary | ICD-10-CM | POA: Diagnosis not present

## 2017-10-24 DIAGNOSIS — C50411 Malignant neoplasm of upper-outer quadrant of right female breast: Secondary | ICD-10-CM | POA: Diagnosis present

## 2017-10-24 DIAGNOSIS — G629 Polyneuropathy, unspecified: Secondary | ICD-10-CM | POA: Insufficient documentation

## 2017-10-24 DIAGNOSIS — R911 Solitary pulmonary nodule: Secondary | ICD-10-CM

## 2017-10-24 LAB — CBC WITH DIFFERENTIAL/PLATELET
Basophils Absolute: 0 10*3/uL (ref 0.0–0.1)
Basophils Relative: 1 %
EOS ABS: 0 10*3/uL (ref 0.0–0.5)
EOS PCT: 0 %
HCT: 31.2 % — ABNORMAL LOW (ref 34.8–46.6)
Hemoglobin: 10.5 g/dL — ABNORMAL LOW (ref 11.6–15.9)
Lymphocytes Relative: 16 %
Lymphs Abs: 0.7 10*3/uL — ABNORMAL LOW (ref 0.9–3.3)
MCH: 29.2 pg (ref 25.1–34.0)
MCHC: 33.7 g/dL (ref 31.5–36.0)
MCV: 86.6 fL (ref 79.5–101.0)
MONO ABS: 0.8 10*3/uL (ref 0.1–0.9)
Monocytes Relative: 17 %
Neutro Abs: 3 10*3/uL (ref 1.5–6.5)
Neutrophils Relative %: 66 %
PLATELETS: 284 10*3/uL (ref 145–400)
RBC: 3.6 MIL/uL — AB (ref 3.70–5.45)
RDW: 17.1 % — AB (ref 11.2–16.1)
WBC: 4.6 10*3/uL (ref 3.9–10.3)

## 2017-10-24 LAB — COMPREHENSIVE METABOLIC PANEL
ALT: 17 U/L (ref 0–55)
AST: 25 U/L (ref 5–34)
Albumin: 3.4 g/dL — ABNORMAL LOW (ref 3.5–5.0)
Alkaline Phosphatase: 248 U/L — ABNORMAL HIGH (ref 40–150)
Anion gap: 9 (ref 3–11)
BUN: 6 mg/dL — ABNORMAL LOW (ref 7–26)
CHLORIDE: 106 mmol/L (ref 98–109)
CO2: 27 mmol/L (ref 22–29)
CREATININE: 0.8 mg/dL (ref 0.60–1.10)
Calcium: 9.5 mg/dL (ref 8.4–10.4)
Glucose, Bld: 86 mg/dL (ref 70–140)
POTASSIUM: 3.5 mmol/L (ref 3.3–4.7)
SODIUM: 142 mmol/L (ref 136–145)
Total Bilirubin: 0.4 mg/dL (ref 0.2–1.2)
Total Protein: 7.5 g/dL (ref 6.4–8.3)

## 2017-10-24 MED ORDER — PROCHLORPERAZINE MALEATE 10 MG PO TABS: 10.0000 mg | ORAL_TABLET | Freq: Once | ORAL | Status: DC

## 2017-10-24 MED ORDER — SODIUM CHLORIDE 0.9 % IV SOLN
Freq: Once | INTRAVENOUS | Status: AC
  Administered 2017-10-24: 11:00:00 via INTRAVENOUS

## 2017-10-24 MED ORDER — HEPARIN SOD (PORK) LOCK FLUSH 100 UNIT/ML IV SOLN
500.0000 [IU] | Freq: Once | INTRAVENOUS | Status: AC | PRN
  Administered 2017-10-24: 500 [IU]
  Filled 2017-10-24: qty 5

## 2017-10-24 MED ORDER — SODIUM CHLORIDE 0.9% FLUSH
10.0000 mL | INTRAVENOUS | Status: DC | PRN
  Administered 2017-10-24: 10 mL
  Filled 2017-10-24: qty 10

## 2017-10-24 MED ORDER — ERIBULIN MESYLATE CHEMO INJECTION 1 MG/2ML
1.4000 mg/m2 | Freq: Once | INTRAVENOUS | Status: AC
  Administered 2017-10-24: 2.6 mg via INTRAVENOUS
  Filled 2017-10-24: qty 5.2

## 2017-10-24 NOTE — Patient Instructions (Signed)
[facility] Discharge Instructions for Patients Receiving Chemotherapy  Today you received the following chemotherapy agent:  Halaven.  To help prevent nausea and vomiting after your treatment, we encourage you to take your nausea medication as directed.   If you develop nausea and vomiting that is not controlled by your nausea medication, call the clinic.   BELOW ARE SYMPTOMS THAT SHOULD BE REPORTED IMMEDIATELY:  *FEVER GREATER THAN 100.5 F  *CHILLS WITH OR WITHOUT FEVER  NAUSEA AND VOMITING THAT IS NOT CONTROLLED WITH YOUR NAUSEA MEDICATION  *UNUSUAL SHORTNESS OF BREATH  *UNUSUAL BRUISING OR BLEEDING  TENDERNESS IN MOUTH AND THROAT WITH OR WITHOUT PRESENCE OF ULCERS  *URINARY PROBLEMS  *BOWEL PROBLEMS  UNUSUAL RASH Items with * indicate a potential emergency and should be followed up as soon as possible.  Feel free to call the clinic should you have any questions or concerns. The clinic phone number is [phone].  Please show the [place] at check-in to the Emergency Department and triage nurse.

## 2017-10-24 NOTE — Progress Notes (Signed)
 Patient Care Team: Griffin, Elaine, MD as PCP - General (Family Medicine) Magrinat, Gustav C, MD as Consulting Physician (Oncology) Wentworth, Stacy, MD (Inactive) as Consulting Physician (Radiation Oncology) Stuart, Dawn C, RN as Registered Nurse Martini, Keisha N, RN as Registered Nurse Newman, David, MD as Consulting Physician (General Surgery) Mackey, Heather Thompson, NP as Nurse Practitioner (Hematology and Oncology)  DIAGNOSIS:  Encounter Diagnosis  Name Primary?  . Malignant neoplasm of upper-outer quadrant of right breast in female, estrogen receptor negative (HCC)     SUMMARY OF ONCOLOGIC HISTORY:   Breast cancer of upper-outer quadrant of right female breast (HCC)   01/19/2015 Mammogram    Right breast: density with indistinct margin at 12:00, posterior depth; also oval mass with obscured margin at 11:00, middle depth; irregular mass with indistinct margin central to nipple, middle depth      01/23/2015 Initial Biopsy    Right breast 9:00 and 10:00 biopsy ([facility]): Grade 3 IDC ER/PR negative, HER-2 negative, Ki-67 77% and 73%; the right axillary lymph node biopsy: ER 22% positive      01/30/2015 Breast MRI    Right breast masses 9 and 10:00 position, additional irregular masses with non-mass enhancement upper outer quadrant with one mass extending into upper inner quadrant, multifocal, multicentric 13.7 x 4.6 x 4.6 cm, malignant right axillary lymph node      02/08/2015 Clinical Stage    Stage IIIA: T3, N1, M0      02/09/2015 Procedure    BreastNext panel revealed VUS BRCA2 gene called p.[id number] (c.3749A>G); otherwise no clinically significant variant at ATM, BARD1, BRCA1, BRCA2, BRIP1, CDH1, CHEK2, MRE11A, MUTYH, NBN, NF1, PALB2, PTEN, RAD50, RAD51C, RAD51D, and TP53      02/15/2015 Imaging    No evidence of metastatic disease, 2.2 cm enhancing lesion anterior left lower kidney suspicious for solid renal neoplasm      02/20/2015 - 07/10/2015 Neo-Adjuvant Chemotherapy   dose dense doxorubicin and cyclophosphamide x 4 followed by Abraxane weekly x 12      07/10/2015 Breast MRI    Multiple irregular enhancing masses right breast are less confluent and smaller dominant mass 10:00 2.1 x 1.9 x 1.3 cm; right breast 11:00 mass measures 1.2 x 1.2 x 0.8 cm, decreased right axillary lymph node thickening      07/28/2015 Definitive Surgery    Rt mastectomy: Multifocal IDC 2 cm, 1.5 cm. 0.6 cm; 3/13 LN positive, ER/PR HER-2      07/28/2015 Pathologic Stage    Stage IIA: T1c N1 M0        09/03/2015 - 10/13/2015 Radiation Therapy    Adjuvant radiation therapy: Right chest wall/50.4 Gray @ 1.8 Gray per fraction x 28 fractions. Right supraclavicular fossa 45 Gy @1.8 Gy per fraction x 25 fractions      11/15/2015 - 05/10/2016 Chemotherapy    Adjuvant capecitabine - completed 8 cycles      11/16/2015 Survivorship    Survivorship visit completed and copy of care plan given to patient      02/23/2016 Procedure    Left mandible biopsy: Scant material, no malignancy      05/21/2016 -  Anti-estrogen oral therapy    Patient did not take tamoxifen because her ER was weakly positive at 22% and she was worried about toxicities      02/21/2017 Imaging    Rt Axilla 8 X 7 X 7 mm mass inv pectoralis musculature, additional mass 9 mm 1.5 cm apart      02/21/2017 Relapse/Recurrence      Right axillary mass biopsy: 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      03/24/2017 Miscellaneous    Foundation 1 analysis: Microsatellite stable; TMB 9 muts/Mb; MYC amplification, PIK3C2B p717L; TP53 [id number]      03/25/2017 - 07/15/2017 Chemotherapy    Carboplatin and gemcitabine palliative chemotherapy      07/28/2017 PET scan    Progression of metastatic disease new liver lesion on 0.6 cm SUV 7.2, previous liver lesion 1.4 cm unchanged but shows no activity; scattered hypermetabolic bone lesions some of which are new, scattered pulmonary nodules stable hypermetabolic right subpectoral and  mediastinal/internal mammary nodes stable      08/22/2017 - 09/10/2017 Radiation Therapy    Palliative radiation to brachial plexus concurrently with Xeloda      09/12/2017 -  Chemotherapy    Halaven day 1 day 8 every 3 weeks        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  She reports no side effects from the treatment.  She is full of energy.  Her major complaints are related to neuropathic pain in the right hand as well as difficulty with flexing her fingers and muscle soreness and discomfort.  She has been to physical therapy but and she plans to continue to go to physical therapy.  She has had some improvement in her pain issues and neuropathy issues but she wishes that her hand would improve much more so that she can function better.  REVIEW OF SYSTEMS:   Constitutional: Denies fevers, chills or abnormal weight loss Eyes: Denies blurriness of vision Ears, nose, mouth, throat, and face: Denies mucositis or sore throat Respiratory: Denies cough, dyspnea or wheezes Cardiovascular: Denies palpitation, chest discomfort Gastrointestinal:  Denies nausea, heartburn or change in bowel habits Skin: Denies abnormal skin rashes Lymphatics: Denies new lymphadenopathy or easy bruising Neurological:Denies numbness, tingling or new weaknesses Behavioral/Psych: Mood is stable, no new changes  Extremities: Neuropathy, difficulty with flexion and neuropathic pain  All other systems were reviewed with the patient and are negative.  I have reviewed the past medical history, past  surgical history, social history and family history with the patient and they are unchanged from previous note.  ALLERGIES:  is allergic to sulfa antibiotics.  MEDICATIONS:  Current Outpatient Medications  Medication Sig Dispense Refill  . benazepril (LOTENSIN) 10 MG tablet Take 1 tablet (10 mg total) by mouth daily.    . Black Pepper-Turmeric 3-500 MG CAPS Take 1 capsule by mouth daily.    Marland Kitchen gabapentin (NEURONTIN) 300 MG capsule Take 1 capsule (300 mg total) by mouth 3 (three) times daily. 90 capsule 3  . lidocaine-prilocaine (EMLA) cream Apply to affected area once 30 g 3  . Multiple Vitamins-Minerals (MULTIVITAMIN) tablet Take 1 tablet by mouth daily.    Marland Kitchen oxyCODONE (OXY IR/ROXICODONE) 5 MG immediate release tablet Take 1 tablet (5 mg total) by mouth every 4 (four) hours as needed for severe pain. 90 tablet 0  . potassium chloride SA (K-DUR,KLOR-CON) 20 MEQ tablet TAKE 1 TABLET(20 MEQ) BY MOUTH DAILY 30 tablet 0  .  Probiotic Product (PROBIOTIC PO) Take 1 tablet by mouth daily.    . rivaroxaban (XARELTO) 20 MG TABS tablet Take 1 tablet (20 mg total) by mouth daily with supper. 90 tablet 1   No current facility-administered medications for this visit.    Facility-Administered Medications Ordered in Other Visits  Medication Dose Route Frequency Provider Last Rate Last Dose  . eriBULin mesylate (HALAVEN) 2.6 mg in sodium chloride 0.9 % 100 mL chemo infusion  1.4 mg/m2 (Treatment Plan Recorded) Intravenous Once Nicholas Lose, MD      . heparin lock flush 100 unit/mL  500 Units Intracatheter Once PRN Nicholas Lose, MD      . prochlorperazine (COMPAZINE) tablet 10 mg  10 mg Oral Once Nicholas Lose, MD      . sodium chloride flush (NS) 0.9 % injection 10 mL  10 mL Intracatheter PRN Nicholas Lose, MD        PHYSICAL EXAMINATION: ECOG PERFORMANCE STATUS: 1 - Symptomatic but completely ambulatory  Vitals:   10/24/17 0934  BP: (!) 144/93  Pulse: 88  Resp: 18  Temp: 98.7 F (37.1 C)  SpO2:  99%   Filed Weights   10/24/17 0934  Weight: 153 lb 6.4 oz (69.6 kg)    GENERAL:alert, no distress and comfortable SKIN: skin color, texture, turgor are normal, no rashes or significant lesions EYES: normal, Conjunctiva are pink and non-injected, sclera clear OROPHARYNX:no exudate, no erythema and lips, buccal mucosa, and tongue normal  NECK: supple, thyroid normal size, non-tender, without nodularity LYMPH:  no palpable lymphadenopathy in the cervical, axillary or inguinal LUNGS: clear to auscultation and percussion with normal breathing effort HEART: regular rate & rhythm and no murmurs and no lower extremity edema ABDOMEN:abdomen soft, non-tender and normal bowel sounds MUSCULOSKELETAL:no cyanosis of digits and no clubbing  NEURO: alert & oriented x 3 with fluent speech, no focal motor/sensory deficits EXTREMITIES: Right arm swelling has improved significantly, difficulty with flexion of the right arm and pain.  Difficult difficulty with range of motion of the right shoulder as well.  LABORATORY DATA:  I have reviewed the data as listed CMP Latest Ref Rng & Units 10/24/2017 10/10/2017 10/03/2017  Glucose 70 - 140 mg/dL 86 115 110  BUN 7 - 26 mg/dL 6(L) 7.0 5.7(L)  Creatinine 0.60 - 1.10 mg/dL 0.80 0.8 0.8  Sodium 136 - 145 mmol/L 142 142 140  Potassium 3.3 - 4.7 mmol/L 3.5 3.4(L) 3.2(L)  Chloride 98 - 109 mmol/L 106 - -  CO2 22 - 29 mmol/L '27 24 24  '$ Calcium 8.4 - 10.4 mg/dL 9.5 9.6 9.5  Total Protein 6.4 - 8.3 g/dL 7.5 7.6 7.3  Total Bilirubin 0.2 - 1.2 mg/dL 0.4 0.31 0.44  Alkaline Phos 40 - 150 U/L 248(H) 208(H) 205(H)  AST 5 - 34 U/L '25 29 28  '$ ALT 0 - 55 U/L '17 20 21    '$ Lab Results  Component Value Date   WBC 4.6 10/24/2017   HGB 10.5 (L) 10/24/2017   HCT 31.2 (L) 10/24/2017   MCV 86.6 10/24/2017   PLT 284 10/24/2017   NEUTROABS 3.0 10/24/2017    ASSESSMENT & PLAN:  Breast cancer of upper-outer quadrant of right female breast Right breast invasive ductal  carcinoma multifocal, multicentric disease with at least 8 tumors, 2.3 cm, 1.6 cm of the biggest tumors in addition 6 more tumors 1 cm or less spanning 13.7 cm, right axillary lymph node biopsy positive (ER 22%); primary tumor is ER 0%, PR 0%, HER-2 negative, Ki-67  77% and 73% respectively, grade 3 T3 N1 M0 equals stage IIIa, Grade 3 1. Rt mastectomy 07/28/15: Multifocal IDC 2 cm, 1.5 cm. 0.6 cm; 3/13 LN positive, T1c N1 M0 stage IIA, ER/PR HER-2 negative. 2. Adjuvant XRT 09/03/15 to 10/13/15 3. Adjuvant Xeloda X 6 months (8 cycles) started 11/15/2015 completed July 2017 4. Carboplatin and gemcitabine started 03/14/2017  Current treatment: Halaven day 1 day 8 every 3 weeks started 09/12/2017, today cycle 1 day 8 ----------------------------------------------------------------------- Rt Axillary LN Biopsy: Triple negative right breast cancer [facility] at [facility]: Poorly differentiated metastatic adenocarcinoma triple negative  PET/CT 07/28/2017:Progression of metastatic disease new liver lesion on 0.6 cm SUV 7.2, previous liver lesion 1.4 cm unchanged but shows no activity; scattered hypermetabolic bone lesions some of which are new, scattered pulmonary nodules stable hypermetabolic right subpectoral and mediastinal/internal mammary nodes stable  Severe right brachial plexus pain: Treated with palliative radiation with oral Xeloda concurrently  Current Treatment: Halaven cycle 3 day 1  Halaven toxicities: Patient denies any toxicities to Halaven.  Plan to get scans after this round. Return to clinic in 3 weeks for cycle 4 and to review the scans.   I spent 25 minutes talking to the patient of which more than half was spent in counseling and coordination of care.  Orders Placed This Encounter  Procedures  . CT Abdomen Pelvis W Contrast    Standing Status:   Future    Standing Expiration Date:   10/24/2018    Order Specific Question:   If indicated for the ordered procedure, I  authorize the administration of contrast media per Radiology protocol    Answer:   Yes    Order Specific Question:   Is patient pregnant?    Answer:   No    Order Specific Question:   Preferred imaging location?    Answer:   [facility]    Order Specific Question:   Radiology Contrast Protocol - do NOT remove file path    Answer:   [email or web address]    Order Specific Question:   Reason for Exam additional comments    Answer:   Metastatic breast cancer restaging  . CT Chest W Contrast    Standing Status:   Future    Standing Expiration Date:   10/24/2018    Order Specific Question:   If indicated for the ordered procedure, I authorize the administration of contrast media per Radiology protocol    Answer:   Yes    Order Specific Question:   Is patient pregnant?    Answer:   No    Order Specific Question:   Preferred imaging location?    Answer:   [facility]    Order Specific Question:   Radiology Contrast Protocol - do NOT remove file path    Answer:   [email or web address]    Order Specific Question:   Reason for Exam additional comments    Answer:   Metastatic breast cancer restaging   The patient has a good understanding of the overall plan. she agrees with it. she will call with any problems that may develop before the next visit here.   Harriette Ohara, MD 10/24/17

## 2017-10-27 ENCOUNTER — Telehealth: Payer: Self-pay | Admitting: Hematology and Oncology

## 2017-10-27 NOTE — Telephone Encounter (Signed)
Added additional appointments for January/February. Spoke with patient and she will get new schedule 1/18. Central radiology will call patient re scan.

## 2017-10-29 ENCOUNTER — Telehealth: Payer: Self-pay | Admitting: *Deleted

## 2017-10-29 ENCOUNTER — Inpatient Hospital Stay: Payer: 59

## 2017-10-29 ENCOUNTER — Other Ambulatory Visit: Payer: Self-pay | Admitting: *Deleted

## 2017-10-29 DIAGNOSIS — I82621 Acute embolism and thrombosis of deep veins of right upper extremity: Secondary | ICD-10-CM

## 2017-10-29 DIAGNOSIS — C50411 Malignant neoplasm of upper-outer quadrant of right female breast: Secondary | ICD-10-CM

## 2017-10-29 DIAGNOSIS — Z5111 Encounter for antineoplastic chemotherapy: Secondary | ICD-10-CM | POA: Diagnosis not present

## 2017-10-29 DIAGNOSIS — Z170 Estrogen receptor positive status [ER+]: Principal | ICD-10-CM

## 2017-10-29 LAB — CBC WITH DIFFERENTIAL (CANCER CENTER ONLY)
BASOS ABS: 0 10*3/uL (ref 0.0–0.1)
BASOS PCT: 1 %
Eosinophils Absolute: 0 10*3/uL (ref 0.0–0.5)
Eosinophils Relative: 0 %
HEMATOCRIT: 30.2 % — AB (ref 34.8–46.6)
HEMOGLOBIN: 10.1 g/dL — AB (ref 11.6–15.9)
Lymphocytes Relative: 18 %
Lymphs Abs: 0.8 10*3/uL — ABNORMAL LOW (ref 0.9–3.3)
MCH: 28.2 pg (ref 25.1–34.0)
MCHC: 33.4 g/dL (ref 31.5–36.0)
MCV: 84.4 fL (ref 79.5–101.0)
MONO ABS: 0.1 10*3/uL (ref 0.1–0.9)
MONOS PCT: 2 %
NEUTROS ABS: 3.5 10*3/uL (ref 1.5–6.5)
NEUTROS PCT: 79 %
Platelet Count: 275 10*3/uL (ref 145–400)
RBC: 3.58 MIL/uL — ABNORMAL LOW (ref 3.70–5.45)
RDW: 16.1 % (ref 11.2–16.1)
WBC Count: 4.4 10*3/uL (ref 3.9–10.3)

## 2017-10-29 LAB — PROTIME-INR
INR: 1.19
PROTHROMBIN TIME: 15 s (ref 11.4–15.2)

## 2017-10-29 NOTE — Telephone Encounter (Signed)
Received call from Phyllis Pruitt stating she accidentally took 2 doses of Xarelto on Friday because she couldn't remember if she had taken it.  She states on Saturday she started having vaginal bleeding with large clots.  She did continue to take her Xarelto.  Per Dr. [name] we will draw labs today and she will need to see her OB/GYN.  She is to stop Xarelto until the bleeding stops.  Patien verbalized understanding.  Instructed next time if she does not remember if she has taken the medication or not.  Please just to skip that day.

## 2017-10-31 ENCOUNTER — Inpatient Hospital Stay: Payer: 59

## 2017-10-31 VITALS — BP 135/79 | HR 94 | Temp 98.6°F | Resp 18 | Wt 150.2 lb

## 2017-10-31 DIAGNOSIS — C50411 Malignant neoplasm of upper-outer quadrant of right female breast: Secondary | ICD-10-CM

## 2017-10-31 DIAGNOSIS — Z5111 Encounter for antineoplastic chemotherapy: Secondary | ICD-10-CM | POA: Diagnosis not present

## 2017-10-31 DIAGNOSIS — Z171 Estrogen receptor negative status [ER-]: Principal | ICD-10-CM

## 2017-10-31 LAB — CBC WITH DIFFERENTIAL/PLATELET
Basophils Absolute: 0 10*3/uL (ref 0.0–0.1)
Basophils Relative: 1 %
EOS PCT: 0 %
Eosinophils Absolute: 0 10*3/uL (ref 0.0–0.5)
HEMATOCRIT: 30.2 % — AB (ref 34.8–46.6)
Hemoglobin: 10.2 g/dL — ABNORMAL LOW (ref 11.6–15.9)
LYMPHS ABS: 0.7 10*3/uL — AB (ref 0.9–3.3)
LYMPHS PCT: 16 %
MCH: 29 pg (ref 25.1–34.0)
MCHC: 33.6 g/dL (ref 31.5–36.0)
MCV: 86.2 fL (ref 79.5–101.0)
MONO ABS: 0.3 10*3/uL (ref 0.1–0.9)
MONOS PCT: 7 %
NEUTROS ABS: 3.2 10*3/uL (ref 1.5–6.5)
Neutrophils Relative %: 76 %
PLATELETS: 299 10*3/uL (ref 145–400)
RBC: 3.51 MIL/uL — ABNORMAL LOW (ref 3.70–5.45)
RDW: 17 % — AB (ref 11.2–16.1)
WBC: 4.2 10*3/uL (ref 3.9–10.3)

## 2017-10-31 LAB — COMPREHENSIVE METABOLIC PANEL
ALT: 17 U/L (ref 0–55)
AST: 30 U/L (ref 5–34)
Albumin: 3.4 g/dL — ABNORMAL LOW (ref 3.5–5.0)
Alkaline Phosphatase: 248 U/L — ABNORMAL HIGH (ref 40–150)
Anion gap: 9 (ref 3–11)
BILIRUBIN TOTAL: 0.3 mg/dL (ref 0.2–1.2)
BUN: 9 mg/dL (ref 7–26)
CHLORIDE: 105 mmol/L (ref 98–109)
CO2: 24 mmol/L (ref 22–29)
Calcium: 9.6 mg/dL (ref 8.4–10.4)
Creatinine, Ser: 0.74 mg/dL (ref 0.60–1.10)
GFR calc non Af Amer: >60 mL/min (ref >60)
Glucose, Bld: 96 mg/dL (ref 70–140)
POTASSIUM: 3.5 mmol/L (ref 3.3–4.7)
Sodium: 138 mmol/L (ref 136–145)
TOTAL PROTEIN: 7.8 g/dL (ref 6.4–8.3)

## 2017-10-31 MED ORDER — SODIUM CHLORIDE 0.9% FLUSH
10.0000 mL | INTRAVENOUS | Status: DC | PRN
  Administered 2017-10-31: 10 mL
  Filled 2017-10-31: qty 10

## 2017-10-31 MED ORDER — PROCHLORPERAZINE MALEATE 10 MG PO TABS: 10.0000 mg | ORAL_TABLET | Freq: Once | ORAL | Status: DC

## 2017-10-31 MED ORDER — HEPARIN SOD (PORK) LOCK FLUSH 100 UNIT/ML IV SOLN
500.0000 [IU] | Freq: Once | INTRAVENOUS | Status: AC | PRN
  Administered 2017-10-31: 500 [IU]
  Filled 2017-10-31: qty 5

## 2017-10-31 MED ORDER — SODIUM CHLORIDE 0.9 % IV SOLN
Freq: Once | INTRAVENOUS | Status: AC
  Administered 2017-10-31: 10:00:00 via INTRAVENOUS

## 2017-10-31 MED ORDER — SODIUM CHLORIDE 0.9 % IV SOLN
1.4000 mg/m2 | Freq: Once | INTRAVENOUS | Status: AC
  Administered 2017-10-31: 2.6 mg via INTRAVENOUS
  Filled 2017-10-31: qty 5.2

## 2017-10-31 NOTE — Patient Instructions (Signed)
[facility] Discharge Instructions for Patients Receiving Chemotherapy  Today you received the following chemotherapy agents Halaven  To help prevent nausea and vomiting after your treatment, we encourage you to take your nausea medication as directed If you develop nausea and vomiting that is not controlled by your nausea medication, call the clinic.   BELOW ARE SYMPTOMS THAT SHOULD BE REPORTED IMMEDIATELY:  *FEVER GREATER THAN 100.5 F  *CHILLS WITH OR WITHOUT FEVER  NAUSEA AND VOMITING THAT IS NOT CONTROLLED WITH YOUR NAUSEA MEDICATION  *UNUSUAL SHORTNESS OF BREATH  *UNUSUAL BRUISING OR BLEEDING  TENDERNESS IN MOUTH AND THROAT WITH OR WITHOUT PRESENCE OF ULCERS  *URINARY PROBLEMS  *BOWEL PROBLEMS  UNUSUAL RASH Items with * indicate a potential emergency and should be followed up as soon as possible.  Feel free to call the clinic should you have any questions or concerns. The clinic phone number is [phone].  Please show the CHEMO ALERT CARD at check-in to the Emergency Department and triage nurse.   

## 2017-11-04 ENCOUNTER — Encounter (HOSPITAL_COMMUNITY): Payer: Self-pay

## 2017-11-04 ENCOUNTER — Ambulatory Visit (HOSPITAL_COMMUNITY)
Admission: RE | Admit: 2017-11-04 | Discharge: 2017-11-04 | Disposition: A | Discharge status: Home / Self Care | Payer: 59 | Source: Ambulatory Visit | Attending: Hematology and Oncology | Admitting: Hematology and Oncology

## 2017-11-04 DIAGNOSIS — C50411 Malignant neoplasm of upper-outer quadrant of right female breast: Secondary | ICD-10-CM | POA: Diagnosis not present

## 2017-11-04 DIAGNOSIS — C7951 Secondary malignant neoplasm of bone: Secondary | ICD-10-CM | POA: Diagnosis not present

## 2017-11-04 DIAGNOSIS — J90 Pleural effusion, not elsewhere classified: Secondary | ICD-10-CM | POA: Diagnosis not present

## 2017-11-04 DIAGNOSIS — Z171 Estrogen receptor negative status [ER-]: Secondary | ICD-10-CM | POA: Insufficient documentation

## 2017-11-04 DIAGNOSIS — C787 Secondary malignant neoplasm of liver and intrahepatic bile duct: Secondary | ICD-10-CM | POA: Diagnosis not present

## 2017-11-04 MED ORDER — IOPAMIDOL (ISOVUE-300) INJECTION 61%
INTRAVENOUS | Status: AC
  Filled 2017-11-04: qty 100

## 2017-11-04 MED ORDER — IOPAMIDOL (ISOVUE-300) INJECTION 61%
100.0000 mL | Freq: Once | INTRAVENOUS | Status: AC | PRN
  Administered 2017-11-04: 100 mL via INTRAVENOUS

## 2017-11-12 NOTE — Assessment & Plan Note (Signed)
Right breast invasive ductal carcinoma multifocal, multicentric disease with at least 8 tumors, 2.3 cm, 1.6 cm of the biggest tumors in addition 6 more tumors 1 cm or less spanning 13.7 cm, right axillary lymph node biopsy positive (ER 22%); primary tumor is ER 0%, PR 0%, HER-2 negative, Ki-67 77% and 73% respectively, grade 3 T3 N1 M0 equals stage IIIa, Grade 3 1. Rt mastectomy 07/28/15: Multifocal IDC 2 cm, 1.5 cm. 0.6 cm; 3/13 LN positive, T1c N1 M0 stage IIA, ER/PR HER-2 negative. 2. Adjuvant XRT 09/03/15 to 10/13/15 3. Adjuvant Xeloda X 6 months (8 cycles) started 11/15/2015 completed July 2017 4. Carboplatin and gemcitabine started 03/14/2017  Current treatment: Halaven day 1 day 8 every 3 weeks started 09/12/2017, today cycle 1 day 8 ----------------------------------------------------------------------- Rt Axillary LN Biopsy: Triple negative right breast cancer Bronchoscopic Biopsy at [facility]: Poorly differentiated metastatic adenocarcinoma triple negative  Severe right brachial plexus pain: Treated with palliative radiation with oral Xeloda concurrently  Current Treatment: Halaven cycle 4 day 1  Halaven toxicities: Patient denies any toxicities to Halaven.  CT CAP: 11/04/17: Progression of disease: Inc in Rt Int mammary LN, inc in Rt Axillary ST mass . New ST nodules upper lateral margin Rt Breast implant New Lt Pleural mass, new and enlarging Liver mets, Sign inc in bine mets  Because of progression of disease, I recommend treatment with Immunotherapy with pembrolizumab  Return to clinic in 1 week to start Pembro on a compassionate use basis  

## 2017-11-13 ENCOUNTER — Inpatient Hospital Stay: Payer: 59

## 2017-11-13 ENCOUNTER — Inpatient Hospital Stay (HOSPITAL_BASED_OUTPATIENT_CLINIC_OR_DEPARTMENT_OTHER): Payer: 59 | Admitting: Hematology and Oncology

## 2017-11-13 DIAGNOSIS — Z5111 Encounter for antineoplastic chemotherapy: Secondary | ICD-10-CM | POA: Diagnosis not present

## 2017-11-13 DIAGNOSIS — Z95828 Presence of other vascular implants and grafts: Secondary | ICD-10-CM

## 2017-11-13 DIAGNOSIS — R911 Solitary pulmonary nodule: Secondary | ICD-10-CM

## 2017-11-13 DIAGNOSIS — R200 Anesthesia of skin: Secondary | ICD-10-CM

## 2017-11-13 DIAGNOSIS — C50411 Malignant neoplasm of upper-outer quadrant of right female breast: Secondary | ICD-10-CM

## 2017-11-13 DIAGNOSIS — C7951 Secondary malignant neoplasm of bone: Secondary | ICD-10-CM | POA: Diagnosis not present

## 2017-11-13 DIAGNOSIS — Z171 Estrogen receptor negative status [ER-]: Secondary | ICD-10-CM

## 2017-11-13 DIAGNOSIS — C787 Secondary malignant neoplasm of liver and intrahepatic bile duct: Secondary | ICD-10-CM | POA: Diagnosis not present

## 2017-11-13 DIAGNOSIS — C773 Secondary and unspecified malignant neoplasm of axilla and upper limb lymph nodes: Secondary | ICD-10-CM

## 2017-11-13 DIAGNOSIS — M79641 Pain in right hand: Secondary | ICD-10-CM | POA: Diagnosis not present

## 2017-11-13 LAB — COMPREHENSIVE METABOLIC PANEL
ALBUMIN: 3.2 g/dL — AB (ref 3.5–5.0)
ALT: 18 U/L (ref 0–55)
AST: 30 U/L (ref 5–34)
Alkaline Phosphatase: 229 U/L — ABNORMAL HIGH (ref 40–150)
Anion gap: 8 (ref 3–11)
BUN: 6 mg/dL — AB (ref 7–26)
CO2: 26 mmol/L (ref 22–29)
CREATININE: 0.71 mg/dL (ref 0.60–1.10)
Calcium: 9.4 mg/dL (ref 8.4–10.4)
Chloride: 106 mmol/L (ref 98–109)
GFR calc Af Amer: >60 mL/min (ref >60)
GFR calc non Af Amer: >60 mL/min (ref >60)
GLUCOSE: 89 mg/dL (ref 70–140)
Potassium: 3.6 mmol/L (ref 3.5–5.1)
SODIUM: 140 mmol/L (ref 136–145)
Total Bilirubin: 0.5 mg/dL (ref 0.2–1.2)
Total Protein: 7.4 g/dL (ref 6.4–8.3)

## 2017-11-13 LAB — CBC WITH DIFFERENTIAL/PLATELET
BASOS PCT: 1 %
Basophils Absolute: 0 10*3/uL (ref 0.0–0.1)
EOS ABS: 0 10*3/uL (ref 0.0–0.5)
Eosinophils Relative: 0 %
HCT: 29.6 % — ABNORMAL LOW (ref 34.8–46.6)
Hemoglobin: 9.7 g/dL — ABNORMAL LOW (ref 11.6–15.9)
Lymphocytes Relative: 19 %
Lymphs Abs: 0.8 10*3/uL — ABNORMAL LOW (ref 0.9–3.3)
MCH: 27.4 pg (ref 25.1–34.0)
MCHC: 32.8 g/dL (ref 31.5–36.0)
MCV: 83.6 fL (ref 79.5–101.0)
MONO ABS: 0.6 10*3/uL (ref 0.1–0.9)
Monocytes Relative: 15 %
Neutro Abs: 2.5 10*3/uL (ref 1.5–6.5)
Neutrophils Relative %: 65 %
Platelets: 347 10*3/uL (ref 145–400)
RBC: 3.54 MIL/uL — ABNORMAL LOW (ref 3.70–5.45)
RDW: 17.1 % — AB (ref 11.2–14.5)
WBC: 3.9 10*3/uL (ref 3.9–10.3)

## 2017-11-13 MED ORDER — SODIUM CHLORIDE 0.9% FLUSH
10.0000 mL | INTRAVENOUS | Status: DC | PRN
  Administered 2017-11-13: 10 mL via INTRAVENOUS
  Filled 2017-11-13: qty 10

## 2017-11-13 MED ORDER — HEPARIN SOD (PORK) LOCK FLUSH 100 UNIT/ML IV SOLN
500.0000 [IU] | Freq: Once | INTRAVENOUS | Status: AC | PRN
  Administered 2017-11-13: 500 [IU] via INTRAVENOUS
  Filled 2017-11-13: qty 5

## 2017-11-13 NOTE — Progress Notes (Signed)
Patient Care Team: [name], MD as PCP - General (Family Medicine) Magrinat, Virgie Dad, MD as Consulting Physician (Oncology) Thea Silversmith, MD (Inactive) as Consulting Physician (Radiation Oncology) Mauro Kaufmann, RN as Registered Nurse Rockwell Germany, RN as Registered Nurse Alphonsa Overall, MD as Consulting Physician (General Surgery) Sylvan Cheese, NP as Nurse Practitioner (Hematology and Oncology)  DIAGNOSIS:  Encounter Diagnosis  Name Primary?  . Malignant neoplasm of upper-outer quadrant of right breast in female, estrogen receptor negative ([place])     SUMMARY OF ONCOLOGIC HISTORY:   Breast cancer of upper-outer quadrant of right female breast ([place])   01/19/2015 Mammogram    Right breast: density with indistinct margin at 12:00, posterior depth; also oval mass with obscured margin at 11:00, middle depth; irregular mass with indistinct margin central to nipple, middle depth      01/23/2015 Initial Biopsy    Right breast 9:00 and 10:00 biopsy ([facility]): Grade 3 IDC ER/PR negative, HER-2 negative, Ki-67 77% and 73%; the right axillary lymph node biopsy: ER 22% positive      01/30/2015 Breast MRI    Right breast masses 9 and 10:00 position, additional irregular masses with non-mass enhancement upper outer quadrant with one mass extending into upper inner quadrant, multifocal, multicentric 13.7 x 4.6 x 4.6 cm, malignant right axillary lymph node      02/08/2015 Clinical Stage    Stage IIIA: T3, N1, M0      02/09/2015 Procedure    BreastNext panel revealed VUS BRCA2 gene called p.[id number] (c.[id number]>G); otherwise no clinically significant variant at ATM, BARD1, BRCA1, BRCA2, BRIP1, CDH1, CHEK2, MRE11A, MUTYH, NBN, NF1, PALB2, PTEN, RAD50, RAD51C, RAD51D, and TP53      02/15/2015 Imaging    No evidence of metastatic disease, 2.2 cm enhancing lesion anterior left lower kidney suspicious for solid renal neoplasm      02/20/2015 - 07/10/2015 Neo-Adjuvant Chemotherapy   dose dense doxorubicin and cyclophosphamide x 4 followed by Abraxane weekly x 12      07/10/2015 Breast MRI    Multiple irregular enhancing masses right breast are less confluent and smaller dominant mass 10:00 2.1 x 1.9 x 1.3 cm; right breast 11:00 mass measures 1.2 x 1.2 x 0.8 cm, decreased right axillary lymph node thickening      07/28/2015 Definitive Surgery    Rt mastectomy: Multifocal IDC 2 cm, 1.5 cm. 0.6 cm; 3/13 LN positive, ER/PR HER-2      07/28/2015 Pathologic Stage    Stage IIA: T1c N1 M0        09/03/2015 - 10/13/2015 Radiation Therapy    Adjuvant radiation therapy: Right chest wall/50.4 [place] Cables @ 1.8 Gray per fraction x 28 fractions. Right supraclavicular fossa 45 Gy _0 .8 Gy per fraction x 25 fractions      11/15/2015 - 05/10/2016 Chemotherapy    Adjuvant capecitabine - completed 8 cycles      11/16/2015 Survivorship    Survivorship visit completed and copy of care plan given to patient      02/23/2016 Procedure    Left mandible biopsy: Scant material, no malignancy      05/21/2016 -  Anti-estrogen oral therapy    Patient did not take tamoxifen because her ER was weakly positive at 22% and she was worried about toxicities      02/21/2017 Imaging    Rt Axilla 8 X 7 X 7 mm mass inv pectoralis musculature, additional mass 9 mm 1.5 cm apart      02/21/2017 Relapse/Recurrence  Right axillary mass biopsy: 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      03/24/2017 Miscellaneous    Foundation 1 analysis: Microsatellite stable; TMB 9 muts/Mb; MYC amplification, PIK3C2B p717L; TP53 [id number]      03/25/2017 - 07/15/2017 Chemotherapy    Carboplatin and gemcitabine palliative chemotherapy      07/28/2017 PET scan    Progression of metastatic disease new liver lesion on 0.6 cm SUV 7.2, previous liver lesion 1.4 cm unchanged but shows no activity; scattered hypermetabolic bone lesions some of which are new, scattered pulmonary nodules stable hypermetabolic right subpectoral and  mediastinal/internal mammary nodes stable      08/22/2017 - 09/10/2017 Radiation Therapy    Palliative radiation to brachial plexus concurrently with Xeloda      09/12/2017 -  Chemotherapy    Halaven day 1 day 8 every 3 weeks        Renal cell carcinoma of left kidney (HCC)   01/29/2016 Surgery    Left renal mass partial resection: 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  She is here today to discuss the results of the CT scans.  There is evidence of progression of disease in the scans.  Her disease has been quite difficult to control as it has been progressing through all the previous chemotherapy treatments.  She continues to have pain and discomfort in the right brachial plexus area.  She has numbness pain and discomfort in the right hand and right palm.  In spite of disease progression, her right shoulder symptoms have improved although she continues to have numbness in the right arm.  REVIEW OF SYSTEMS:   Constitutional: Denies fevers, chills or abnormal weight loss Eyes: Denies blurriness of vision Ears, nose, mouth, throat, and face: Denies mucositis or sore throat Respiratory: Denies cough, dyspnea or wheezes Cardiovascular: Denies palpitation, chest discomfort Gastrointestinal:  Denies nausea, heartburn or change in bowel habits Skin: Denies abnormal skin rashes Lymphatics: Denies new lymphadenopathy or easy bruising Neurological: Numbness of the right upper extremity, pain in the right shoulder Behavioral/Psych: Mood is stable, no new changes  Extremities: Right arm swelling  All other systems were reviewed with the patient and are negative.  I have  reviewed the past medical history, past surgical history, social history and family history with the patient and they are unchanged from previous note.  ALLERGIES:  is allergic to sulfa antibiotics.  MEDICATIONS:  Current Outpatient Medications  Medication Sig Dispense Refill  . benazepril (LOTENSIN) 10 MG tablet Take 1 tablet (10 mg total) by mouth daily.    . Black Pepper-Turmeric 3-500 MG CAPS Take 1 capsule by mouth daily.    . gabapentin (NEURONTIN) 300 MG capsule Take 1 capsule (300 mg total) by mouth 3 (three) times daily. 90 capsule 3  . lidocaine-prilocaine (EMLA) cream Apply to affected area once 30 g 3  . Multiple Vitamins-Minerals (MULTIVITAMIN) tablet Take 1 tablet by mouth daily.    . oxyCODONE (OXY IR/ROXICODONE) 5 MG immediate release tablet Take 1 tablet (5 mg total) by mouth every 4 (four) hours as needed for severe pain. 90 tablet 0  . potassium chloride SA (K-DUR,KLOR-CON) 20 MEQ tablet TAKE 1 TABLET(20 MEQ)   BY MOUTH DAILY 30 tablet 0  . Probiotic Product (PROBIOTIC PO) Take 1 tablet by mouth daily.    . rivaroxaban (XARELTO) 20 MG TABS tablet Take 1 tablet (20 mg total) by mouth daily with supper. 90 tablet 1   No current facility-administered medications for this visit.     PHYSICAL EXAMINATION: ECOG PERFORMANCE STATUS: 1 - Symptomatic but completely ambulatory  Vitals:   11/13/17 0840  BP: (!) 148/92  Pulse: 92  Resp: 20  Temp: 97.7 F (36.5 C)  SpO2: 99%   Filed Weights   11/13/17 0840  Weight: 149 lb 8 oz (67.8 kg)    GENERAL:alert, no distress and comfortable SKIN: skin color, texture, turgor are normal, no rashes or significant lesions EYES: normal, Conjunctiva are pink and non-injected, sclera clear OROPHARYNX:no exudate, no erythema and lips, buccal mucosa, and tongue normal  NECK: supple, thyroid normal size, non-tender, without nodularity LYMPH:  no palpable lymphadenopathy in the cervical, axillary or inguinal LUNGS: clear to auscultation and  percussion with normal breathing effort HEART: regular rate & rhythm and no murmurs and no lower extremity edema ABDOMEN:abdomen soft, non-tender and normal bowel sounds MUSCULOSKELETAL:no cyanosis of digits and no clubbing  NEURO: alert & oriented x 3 with fluent speech, no focal motor/sensory deficits EXTREMITIES: No lower extremity edema  LABORATORY DATA:  I have reviewed the data as listed CMP Latest Ref Rng & Units 11/13/2017 10/31/2017 10/24/2017  Glucose 70 - 140 mg/dL 89 96 86  BUN 7 - 26 mg/dL 6(L) 9 6(L)  Creatinine 0.60 - 1.10 mg/dL 0.71 0.74 0.80  Sodium 136 - 145 mmol/L 140 138 142  Potassium 3.5 - 5.1 mmol/L 3.6 3.5 3.5  Chloride 98 - 109 mmol/L 106 105 106  CO2 22 - 29 mmol/L 26 24 27  Calcium 8.4 - 10.4 mg/dL 9.4 9.6 9.5  Total Protein 6.4 - 8.3 g/dL 7.4 7.8 7.5  Total Bilirubin 0.2 - 1.2 mg/dL 0.5 0.3 0.4  Alkaline Phos 40 - 150 U/L 229(H) 248(H) 248(H)  AST 5 - 34 U/L 30 30 25  ALT 0 - 55 U/L 18 17 17    Lab Results  Component Value Date   WBC 3.9 11/13/2017   HGB 9.7 (L) 11/13/2017   HCT 29.6 (L) 11/13/2017   MCV 83.6 11/13/2017   PLT 347 11/13/2017   NEUTROABS 2.5 11/13/2017    ASSESSMENT & PLAN:  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 Rt mastectomy 07/28/15: Multifocal IDC 2 cm, 1.5 cm. 0.6 cm; 3/13 LN positive, T1c N1 M0 stage IIA, ER/PR HER-2 negative. 2. Adjuvant XRT 09/03/15 to 10/13/15 3. Adjuvant Xeloda X 6 months (8 cycles) started 11/15/2015 completed July 2017 4. Carboplatin and gemcitabine started 03/14/2017  Current treatment: Halaven day 1 day 8 every 3 weeks  started 09/12/2017, today cycle 1 day 8 ----------------------------------------------------------------------- Rt Axillary LN Biopsy: Triple negative right breast cancer Bronchoscopic Biopsy at [facility]: Poorly differentiated metastatic adenocarcinoma triple negative  Severe right brachial plexus pain: Treated with palliative radiation with oral Xeloda   Current Treatment: Halaven cycle 4 day 1  Halaven toxicities: Patient denies   any toxicities to Halaven.  CT CAP: 11/04/17: Progression of disease: Inc in Rt Int mammary LN, inc in Rt Axillary ST mass . New ST nodules upper lateral margin Rt Breast implant New Lt Pleural mass, new and enlarging Liver mets, Sign inc in bine mets  Because of progression of disease, discussed with him options for treatment which include the following 1.  Clinical trials for PI K3 mutations 2. clinical trials for MYC amplification 3.  Clinical trials for androgen receptor signaling pathway 4.  Compassionate use pembrolizumab 5.  Some other chemotherapy like Xeloda.  Because the patient does not have BRCA mutation and was 0% on PD1, the likelihood of response to PARP inhibitors or any of the standard chemotherapies or immunotherapy are likely to be fairly minimal We also discussed about going to [place] for clinical trial. We will cancel further chemotherapy until we make a decision on the next treatment plan.  I spent 25 minutes talking to the patient of which more than half was spent in counseling and coordination of care.  No orders of the defined types were placed in this encounter.  The patient has a good understanding of the overall plan. she agrees with it. she will call with any problems that may develop before the next visit here.   Harriette Ohara, MD 11/13/17

## 2017-11-14 ENCOUNTER — Ambulatory Visit: Payer: 59

## 2017-11-14 ENCOUNTER — Telehealth: Payer: Self-pay

## 2017-11-14 ENCOUNTER — Other Ambulatory Visit: Payer: Self-pay

## 2017-11-14 DIAGNOSIS — Z171 Estrogen receptor negative status [ER-]: Principal | ICD-10-CM

## 2017-11-14 DIAGNOSIS — C50411 Malignant neoplasm of upper-outer quadrant of right female breast: Secondary | ICD-10-CM

## 2017-11-14 NOTE — Progress Notes (Signed)
See note

## 2017-11-14 NOTE — Telephone Encounter (Signed)
Per Dr. [name] faxed pt information to Derald Macleod at [facility] for clinical trials including pt demographics, progress notes, lab work, and scan reports. Emailed Tedra Coupe to send pt CD of scans to the provided address. Also faxed referral along with pt information to [facility].  Cyndia Bent RN

## 2017-11-17 ENCOUNTER — Telehealth: Payer: Self-pay | Admitting: *Deleted

## 2017-11-17 NOTE — Telephone Encounter (Signed)
Received call from patient stating she is having some increased back pain.  She is taking oxycodone.  Offered appointment to come int today but patient would rather come tomorrow.  Appointment confirmed for 11/18/16 at 8:30am with Mendel Ryder.

## 2017-11-18 ENCOUNTER — Ambulatory Visit (HOSPITAL_COMMUNITY)
Admission: RE | Admit: 2017-11-18 | Discharge: 2017-11-18 | Disposition: A | Discharge status: Home / Self Care | Payer: 59 | Source: Ambulatory Visit | Attending: Adult Health | Admitting: Adult Health

## 2017-11-18 ENCOUNTER — Telehealth: Payer: Self-pay | Admitting: Radiation Oncology

## 2017-11-18 ENCOUNTER — Telehealth: Payer: Self-pay

## 2017-11-18 ENCOUNTER — Ambulatory Visit
Admission: RE | Admit: 2017-11-18 | Discharge: 2017-11-18 | Disposition: A | Discharge status: Home / Self Care | Payer: 59 | Source: Ambulatory Visit | Attending: Radiation Oncology | Admitting: Radiation Oncology

## 2017-11-18 ENCOUNTER — Encounter: Payer: Self-pay | Admitting: Adult Health

## 2017-11-18 ENCOUNTER — Other Ambulatory Visit: Payer: Self-pay | Admitting: Radiation Oncology

## 2017-11-18 ENCOUNTER — Inpatient Hospital Stay: Payer: 59 | Attending: Hematology and Oncology | Admitting: Adult Health

## 2017-11-18 VITALS — BP 157/94 | HR 100 | Temp 97.9°F | Resp 18 | Ht 65.0 in | Wt 146.9 lb

## 2017-11-18 DIAGNOSIS — K224 Dyskinesia of esophagus: Secondary | ICD-10-CM | POA: Diagnosis not present

## 2017-11-18 DIAGNOSIS — Z5112 Encounter for antineoplastic immunotherapy: Secondary | ICD-10-CM | POA: Diagnosis present

## 2017-11-18 DIAGNOSIS — Z95828 Presence of other vascular implants and grafts: Secondary | ICD-10-CM

## 2017-11-18 DIAGNOSIS — J949 Pleural condition, unspecified: Secondary | ICD-10-CM

## 2017-11-18 DIAGNOSIS — L989 Disorder of the skin and subcutaneous tissue, unspecified: Secondary | ICD-10-CM | POA: Insufficient documentation

## 2017-11-18 DIAGNOSIS — E860 Dehydration: Secondary | ICD-10-CM | POA: Diagnosis not present

## 2017-11-18 DIAGNOSIS — C7951 Secondary malignant neoplasm of bone: Secondary | ICD-10-CM

## 2017-11-18 DIAGNOSIS — C50411 Malignant neoplasm of upper-outer quadrant of right female breast: Secondary | ICD-10-CM | POA: Diagnosis not present

## 2017-11-18 DIAGNOSIS — K209 Esophagitis, unspecified: Secondary | ICD-10-CM | POA: Insufficient documentation

## 2017-11-18 DIAGNOSIS — Z510 Encounter for antineoplastic radiation therapy: Secondary | ICD-10-CM | POA: Diagnosis not present

## 2017-11-18 DIAGNOSIS — Z171 Estrogen receptor negative status [ER-]: Secondary | ICD-10-CM | POA: Insufficient documentation

## 2017-11-18 DIAGNOSIS — M545 Low back pain: Secondary | ICD-10-CM

## 2017-11-18 DIAGNOSIS — Z9011 Acquired absence of right breast and nipple: Secondary | ICD-10-CM | POA: Insufficient documentation

## 2017-11-18 DIAGNOSIS — R112 Nausea with vomiting, unspecified: Secondary | ICD-10-CM

## 2017-11-18 DIAGNOSIS — N9089 Other specified noninflammatory disorders of vulva and perineum: Secondary | ICD-10-CM | POA: Diagnosis not present

## 2017-11-18 DIAGNOSIS — M5127 Other intervertebral disc displacement, lumbosacral region: Secondary | ICD-10-CM | POA: Diagnosis not present

## 2017-11-18 DIAGNOSIS — C773 Secondary and unspecified malignant neoplasm of axilla and upper limb lymph nodes: Secondary | ICD-10-CM | POA: Diagnosis not present

## 2017-11-18 DIAGNOSIS — C787 Secondary malignant neoplasm of liver and intrahepatic bile duct: Secondary | ICD-10-CM | POA: Insufficient documentation

## 2017-11-18 DIAGNOSIS — Z79899 Other long term (current) drug therapy: Secondary | ICD-10-CM | POA: Diagnosis not present

## 2017-11-18 MED ORDER — SODIUM CHLORIDE 0.9 % IV SOLN
INTRAVENOUS | Status: AC
  Administered 2017-11-18: 10:00:00 via INTRAVENOUS

## 2017-11-18 MED ORDER — ONDANSETRON HCL 4 MG/2ML IJ SOLN
INTRAMUSCULAR | Status: AC
  Filled 2017-11-18: qty 4

## 2017-11-18 MED ORDER — ONDANSETRON HCL 4 MG/2ML IJ SOLN
8.0000 mg | Freq: Once | INTRAMUSCULAR | Status: AC
  Administered 2017-11-18: 8 mg via INTRAVENOUS

## 2017-11-18 MED ORDER — GADOBENATE DIMEGLUMINE 529 MG/ML IV SOLN
15.0000 mL | Freq: Once | INTRAVENOUS | Status: AC | PRN
  Administered 2017-11-18: 13 mL via INTRAVENOUS

## 2017-11-18 MED ORDER — HEPARIN SOD (PORK) LOCK FLUSH 100 UNIT/ML IV SOLN
500.0000 [IU] | Freq: Once | INTRAVENOUS | Status: AC | PRN
  Administered 2017-11-18: 500 [IU] via INTRAVENOUS
  Filled 2017-11-18: qty 5

## 2017-11-18 MED ORDER — DEXAMETHASONE SODIUM PHOSPHATE 10 MG/ML IJ SOLN
10.0000 mg | Freq: Once | INTRAMUSCULAR | Status: AC
  Administered 2017-11-18: 10 mg via INTRAVENOUS

## 2017-11-18 MED ORDER — DEXAMETHASONE SODIUM PHOSPHATE 10 MG/ML IJ SOLN
INTRAMUSCULAR | Status: AC
  Filled 2017-11-18: qty 1

## 2017-11-18 MED ORDER — DEXAMETHASONE 4 MG PO TABS: 4.0000 mg | ORAL_TABLET | Freq: Two times a day (BID) | ORAL | 1 refills | Status: DC

## 2017-11-18 MED ORDER — SODIUM CHLORIDE 0.9 % IV SOLN: Freq: Once | INTRAVENOUS | Status: DC

## 2017-11-18 MED ORDER — ONDANSETRON HCL 8 MG PO TABS: 8.0000 mg | ORAL_TABLET | Freq: Three times a day (TID) | ORAL | 0 refills | Status: DC | PRN

## 2017-11-18 MED ORDER — SODIUM CHLORIDE 0.9% FLUSH
10.0000 mL | INTRAVENOUS | Status: DC | PRN
  Administered 2017-11-18: 10 mL via INTRAVENOUS
  Filled 2017-11-18: qty 10

## 2017-11-18 MED ORDER — HYDROMORPHONE HCL 2 MG/ML IJ SOLN
INTRAMUSCULAR | Status: AC
  Filled 2017-11-18: qty 1

## 2017-11-18 MED ORDER — HYDROMORPHONE HCL 1 MG/ML IJ SOLN
2.0000 mg | Freq: Once | INTRAMUSCULAR | Status: AC
  Administered 2017-11-18: 2 mg via INTRAVENOUS

## 2017-11-18 MED ORDER — SODIUM CHLORIDE 0.9 % IV SOLN: 10.0000 mg | Freq: Once | INTRAVENOUS | Status: DC

## 2017-11-18 MED ORDER — OXYCODONE HCL 5 MG PO TABS: 5.0000 mg | ORAL_TABLET | ORAL | 0 refills | Status: DC | PRN

## 2017-11-18 NOTE — Telephone Encounter (Signed)
I called the patient's husband to let him know that we were aware of her CT findings and Mendel Ryder Causey's assessment. She is having stat MRI films of the thoracic and lumbar imaging. We will be able to simulate her for treatment planning today.

## 2017-11-18 NOTE — Progress Notes (Addendum)
[facility] Follow up:    [name], MD [address] [place] [address]   DIAGNOSIS: Cancer Staging Breast cancer of upper-outer quadrant of right female breast [facility]) Staging form: Breast, AJCC 7th Edition - Clinical stage from 02/08/2015: Stage IIIA (T3, N1, M0) - Unsigned - Pathologic stage from 07/28/2015: Stage IIA (yT1c, N1a, cM0) - Unsigned   SUMMARY OF ONCOLOGIC HISTORY:   Breast cancer of upper-outer quadrant of right female breast ([place])   01/19/2015 Mammogram    Right breast: density with indistinct margin at 12:00, posterior depth; also oval mass with obscured margin at 11:00, middle depth; irregular mass with indistinct margin central to nipple, middle depth      01/23/2015 Initial Biopsy    Right breast 9:00 and 10:00 biopsy ([facility]): Grade 3 IDC ER/PR negative, HER-2 negative, Ki-67 77% and 73%; the right axillary lymph node biopsy: ER 22% positive      01/30/2015 Breast MRI    Right breast masses 9 and 10:00 position, additional irregular masses with non-mass enhancement upper outer quadrant with one mass extending into upper inner quadrant, multifocal, multicentric 13.7 x 4.6 x 4.6 cm, malignant right axillary lymph node      02/08/2015 Clinical Stage    Stage IIIA: T3, N1, M0      02/09/2015 Procedure    BreastNext panel revealed VUS BRCA2 gene called p.[id number] (c.3749A>G); otherwise no clinically significant variant at ATM, BARD1, BRCA1, BRCA2, BRIP1, CDH1, CHEK2, MRE11A, MUTYH, NBN, NF1, PALB2, PTEN, RAD50, RAD51C, RAD51D, and TP53      02/15/2015 Imaging    No evidence of metastatic disease, 2.2 cm enhancing lesion anterior left lower kidney suspicious for solid renal neoplasm      02/20/2015 - 07/10/2015 Neo-Adjuvant Chemotherapy    dose dense doxorubicin and cyclophosphamide x 4 followed by Abraxane weekly x 12      07/10/2015 Breast MRI    Multiple irregular enhancing masses right breast are less confluent and smaller  dominant mass 10:00 2.1 x 1.9 x 1.3 cm; right breast 11:00 mass measures 1.2 x 1.2 x 0.8 cm, decreased right axillary lymph node thickening      07/28/2015 Definitive Surgery    Rt mastectomy: Multifocal IDC 2 cm, 1.5 cm. 0.6 cm; 3/13 LN positive, ER/PR HER-2      07/28/2015 Pathologic Stage    Stage IIA: T1c N1 M0        09/03/2015 - 10/13/2015 Radiation Therapy    Adjuvant radiation therapy: Right chest wall/50.4 Pearline Cables @ 1.8 Gray per fraction x 28 fractions. Right supraclavicular fossa 45 Gy '@1'$ .8 Gy per fraction x 25 fractions      11/15/2015 - 05/10/2016 Chemotherapy    Adjuvant capecitabine - completed 8 cycles      11/16/2015 Survivorship    Survivorship visit completed and copy of care plan given to patient      02/23/2016 Procedure    Left mandible biopsy: Scant material, no malignancy      05/21/2016 -  Anti-estrogen oral therapy    Patient did not take tamoxifen because her ER was weakly positive at 22% and she was worried about toxicities      02/21/2017 Imaging    Rt Axilla 8 X 7 X 7 mm mass inv pectoralis musculature, additional mass 9 mm 1.5 cm apart      02/21/2017 Relapse/Recurrence    Right axillary mass biopsy: 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      03/24/2017 Miscellaneous  Foundation 1 analysis: Microsatellite stable; TMB 9 muts/Mb; MYC amplification, PIK3C2B p717L; TP53 R248Q      03/25/2017 - 07/15/2017 Chemotherapy    Carboplatin and gemcitabine palliative chemotherapy      07/28/2017 PET scan    Progression of metastatic disease new liver lesion on 0.6 cm SUV 7.2, previous liver lesion 1.4 cm unchanged but shows no activity; scattered hypermetabolic bone lesions some of which are new, scattered pulmonary nodules stable hypermetabolic right subpectoral and mediastinal/internal mammary nodes stable      08/22/2017 - 09/10/2017 Radiation Therapy    Palliative radiation to brachial plexus concurrently with Xeloda      09/12/2017 -  Chemotherapy     Halaven day 1 day 8 every 3 weeks        Renal cell carcinoma of left kidney ([place])   01/29/2016 Surgery    Left renal mass partial resection: 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 female returns for an urgent evaluation due to increasing back pain located in her mid thoracic spine that radiates outward.  This pain is constant.  She can lie on a heating pad for a while, and this will help it.  Her pain is a 8 constantly, and may decrease to a 5.  She describes it as an aching pain like she has been punched.  She recently hit her knee, and is limping from that, but denies any new weakness in her lower extremities.  No bowel/bladder issues, no new numbness and tingling.  She did take some oxycodone.  She took 2, and this helped to ease her pain.     Patient Active Problem List   Diagnosis Date Noted  . Bone metastases ([place]) 11/18/2017  . Liver metastases ([place]) 11/18/2017  . Port-A-Cath in place 11/13/2017  . Acquired absence of breast and nipple 06/03/2016  . Renal cell carcinoma of left kidney ([place]) 02/06/2016  . Left ovarian cyst 01/15/2016  . Renal mass 12/29/2015  . Chemotherapy-induced peripheral neuropathy ([place]) 08/04/2015  . Genetic testing 03/07/2015  . Left renal mass 02/17/2015  . Breast cancer of upper-outer quadrant of right female breast ([place]) 01/27/2015    is allergic to sulfa antibiotics.  MEDICAL HISTORY: Past Medical History:  Diagnosis Date  . Anemia    during pregnancy  . Breast cancer of upper-outer quadrant of right female breast ([place]) 01/27/2015  . Depression    hx denies any problems now  . History of kidney cancer 12/2015   left removed "spot"  . Lymphedema    rt arm  . Neuropathy   . Sickle cell trait ([place])     SURGICAL HISTORY: Past Surgical History:  Procedure  Laterality Date  . LATISSIMUS FLAP TO BREAST Right 06/03/2016   Procedure: RIGHT LATISSIMUS FLAP TO BREAST;  Surgeon: Irene Limbo, MD;  Location: [place];  Service: Plastics;  Laterality: Right;  . LIPOSUCTION WITH LIPOFILLING Right 09/20/2016   Procedure: LIPOSUCTION WITH LIPOFILLING;  Surgeon: Irene Limbo, MD;  Location: [place];  Service: Plastics;  Laterality: Right;  LIPOSUCTION WITH LIPOFILLING  . MASTECTOMY    . MASTECTOMY MODIFIED RADICAL Right 07/28/2015  . MASTECTOMY MODIFIED RADICAL Right 07/28/2015   Procedure: RIGHT MODIFIED RADICAL MASTECTOMY;  Surgeon: Alphonsa Overall, MD;  Location: [place];  Service: General;  Laterality: Right;  .  PORT-A-CATH REMOVAL  07/28/2015  . PORT-A-CATH REMOVAL  07/28/2015   Procedure: REMOVAL PORT-A-CATH;  Surgeon: Alphonsa Overall, MD;  Location: [place];  Service: General;;  . PORTACATH PLACEMENT N/A 02/10/2015   Procedure: INSERTION PORT-A-CATH WITH ULTRA SOUND, left subclavian,;  Surgeon: Alphonsa Overall, MD;  Location: [facility];  Service: General;  Laterality: N/A;  . PORTACATH PLACEMENT N/A 04/04/2017   Procedure: POWER PORT PLACEMENT;  Surgeon: Alphonsa Overall, MD;  Location: [facility];  Service: General;  Laterality: N/A;  . REMOVAL OF TISSUE EXPANDER AND PLACEMENT OF IMPLANT Right 09/20/2016   Procedure: REMOVAL OF RIGHT TISSUE EXPANDER WITH PLACEMENT OF RIGHT SILICONE BREAST IMPLANTS LIPOFILLING FROM ABDOMEN TO RIGHT CHEST;  Surgeon: Irene Limbo, MD;  Location: [place];  Service: Plastics;  Laterality: Right;  PLACEMENT OF RIGHT SILICONE BREAST IMPLANTS LIPOFILLING FROM ABDOMEN TO RIGHT CHEST  . ROBOTIC ASSITED PARTIAL NEPHRECTOMY Left 12/29/2015   Procedure: XI ROBOTIC ASSITED PARTIAL NEPHRECTOMY;  Surgeon: Cleon Gustin, MD;  Location: [facility];  Service: Urology;  Laterality: Left;  . TISSUE EXPANDER PLACEMENT Right 06/03/2016   Procedure: PLACEMENT RIGHT TISSUE EXPANDER;  Surgeon: Irene Limbo, MD;  Location: [facility];  Service: Plastics;  Laterality: Right;  . TONSILLECTOMY    . TUBAL LIGATION    . wisdome teeth extraction      SOCIAL HISTORY: Social History   Socioeconomic History  . Marital status: Married    Spouse name: Not on file  . Number of children: Not on file  . Years of education: Not on file  . Highest education level: Not on file  Social Needs  . Financial resource strain: Not on file  . Food insecurity - worry: Not on file  . Food insecurity - inability: Not on file  . Transportation needs - medical: Not on file  . Transportation needs - non-medical: Not on file  Occupational History  . Not on file  Tobacco Use  . Smoking status: Never Smoker  . Smokeless tobacco: Never Used  Substance and Sexual Activity  . Alcohol use: No  . Drug use: No  . Sexual activity: Not on file  Other Topics Concern  . Not on file  Social History Narrative  . Not on file    FAMILY HISTORY: Non contributory  Review of Systems  Constitutional: Negative for appetite change, chills, fatigue, fever and unexpected weight change.  HENT:   Negative for hearing loss and lump/mass.   Eyes: Negative for eye problems and icterus.  Respiratory: Negative for chest tightness, cough and shortness of breath.   Cardiovascular: Negative for chest pain, leg swelling and palpitations.  Gastrointestinal: Negative for abdominal distention, abdominal pain, constipation, diarrhea, nausea and vomiting.  Musculoskeletal: Positive for back pain.  Skin: Negative for itching.  Neurological: Positive for extremity weakness (right arm-long standing and unchanged) and numbness (in right hand, longstanding and unchanged). Negative for dizziness and headaches.  Hematological: Negative for adenopathy. Does not bruise/bleed easily.  Psychiatric/Behavioral: Negative for depression. The patient is not nervous/anxious.       PHYSICAL EXAMINATION  ECOG PERFORMANCE STATUS: 2 - Symptomatic, <50% confined to bed  Vitals:    11/18/17 0820  BP: (!) 157/94  Pulse: 100  Resp: 18  Temp: 97.9 F (36.6 C)  SpO2: 100%    Physical Exam  Constitutional: She is oriented to person, place, and time and well-developed, well-nourished, and in no distress.  HENT:  Head: Normocephalic and atraumatic.  Mouth/Throat: Oropharynx is clear and moist. No  oropharyngeal exudate.  Eyes: Pupils are equal, round, and reactive to light. No scleral icterus.  Neck: Neck supple.  Cardiovascular: Normal rate, regular rhythm and normal heart sounds.  Pulmonary/Chest: Effort normal and breath sounds normal.  Abdominal: Soft. Bowel sounds are normal. She exhibits no distension and no mass. There is no tenderness. There is no rebound and no guarding.  Musculoskeletal: She exhibits no edema.  strength 5/5 in bilateral lower ext  Lymphadenopathy:    She has no cervical adenopathy.  Neurological: She is alert and oriented to person, place, and time.  Skin: Skin is warm and dry. No rash noted.  Psychiatric: Mood and affect normal.    LABORATORY DATA:  CBC    Component Value Date/Time   WBC 3.9 11/13/2017 0813   RBC 3.54 (L) 11/13/2017 0813   HGB 9.7 (L) 11/13/2017 0813   HGB 10.7 (L) 10/10/2017 0853   HCT 29.6 (L) 11/13/2017 0813   HCT 32.1 (L) 10/10/2017 0853   PLT 347 11/13/2017 0813   PLT 275 10/29/2017 1147   PLT 272 10/10/2017 0853   MCV 83.6 11/13/2017 0813   MCV 87.7 10/10/2017 0853   MCH 27.4 11/13/2017 0813   MCHC 32.8 11/13/2017 0813   RDW 17.1 (H) 11/13/2017 0813   RDW 15.1 (H) 10/10/2017 0853   LYMPHSABS 0.8 (L) 11/13/2017 0813   LYMPHSABS 0.7 (L) 10/10/2017 0853   MONOABS 0.6 11/13/2017 0813   MONOABS 0.2 10/10/2017 0853   EOSABS 0.0 11/13/2017 0813   EOSABS 0.0 10/10/2017 0853   BASOSABS 0.0 11/13/2017 0813   BASOSABS 0.0 10/10/2017 0853    CMP     Component Value Date/Time   NA 140 11/13/2017 0813   NA 142 10/10/2017 0853   K 3.6 11/13/2017 0813   K 3.4 (L) 10/10/2017 0853   CL 106 11/13/2017  0813   CO2 26 11/13/2017 0813   CO2 24 10/10/2017 0853   GLUCOSE 89 11/13/2017 0813   GLUCOSE 115 10/10/2017 0853   BUN 6 (L) 11/13/2017 0813   BUN 7.0 10/10/2017 0853   CREATININE 0.71 11/13/2017 0813   CREATININE 0.8 10/10/2017 0853   CALCIUM 9.4 11/13/2017 0813   CALCIUM 9.6 10/10/2017 0853   PROT 7.4 11/13/2017 0813   PROT 7.6 10/10/2017 0853   ALBUMIN 3.2 (L) 11/13/2017 0813   ALBUMIN 3.4 (L) 10/10/2017 0853   AST 30 11/13/2017 0813   AST 29 10/10/2017 0853   ALT 18 11/13/2017 0813   ALT 20 10/10/2017 0853   ALKPHOS 229 (H) 11/13/2017 0813   ALKPHOS 208 (H) 10/10/2017 0853   BILITOT 0.5 11/13/2017 0813   BILITOT 0.31 10/10/2017 0853   GFRNONAA >60 11/13/2017 0813   GFRAA >60 11/13/2017 0813       ASSESSMENT and THERAPY PLAN:   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 Rt mastectomy 07/28/15: Multifocal IDC 2 cm, 1.5 cm. 0.6 cm; 3/13 LN positive, T1c N1 M0 stage IIA, ER/PR HER-2 negative. 2. Adjuvant XRT 09/03/15 to 10/13/15 3. Adjuvant Xeloda X 6 months (8 cycles) started 11/15/2015 completed July 2017 4. Carboplatin and gemcitabine started 03/14/2017  Current treatment: Hal[place] day 1 day 8 every 3 weeks started 09/12/2017, today cycle 1 day 8 ----------------------------------------------------------------------- Rt Axillary LN Biopsy: Triple negative right breast cancer Bron[facility]oscopic Biopsy at [facility]: Poorly differentiated metastatic adenocarcinoma triple negative  Severe right brachial plexus pain: Treated with palliative radiation with oral Xeloda concurrently  Current Treatment:  changing to Pembrolizumab  CT CAP: 11/04/17: Progression of disease: Inc in Rt Int mammary LN, inc in Rt Axillary ST mass . New ST nodules upper lateral margin Rt Breast implant New Lt Pleural mass, new and enlarging Liver mets, Sign inc in bone mets  Back pain: Shelley has significant back pain.  Due to the increase in her bony metastases on recent CT scan, she needs a STAT thoracic and lumbar MRI to rule out cord compression.  I reviewed with her that her CT scan has told us that she has significant disease in her bones, and that the MRI will look more in detail at her spine and her spinal cord.  She is very tearful with this news.  I refilled her Oxycodone today, and gave her #120 for her to take 1-2 every 4 hours as needed.    I reviewed the above with Dr. [name], who saw the patient as well.  He also recommended that she have '2mg'$  IV dilaudid before the MRI today at 11 am, and '10mg'$  IV Dexamethasone due to the pain.  I ensured that [place] husband is coming to drive her home with the Dilaudid in her system.  We will also likely be consulting radiation, however will wait until we have results from her MRI.       Orders Placed This Encounter  Procedures  . MR THORACIC SPINE W WO CONTRAST    Standing Status:   Future    Standing Expiration Date:   01/17/2019    Order Specific Question:   GRA to provide read?    Answer:   Yes    Order Specific Question:   If indicated for the ordered procedure, I authorize the administration of contrast media per Radiology protocol    Answer:   Yes    Order Specific Question:   What is the patient's sedation requirement?    Answer:   No Sedation    Order Specific Question:   Does the patient have a pacemaker or implanted devices?    Answer:   No    Order Specific Question:   Preferred imaging location?    Answer:   [facility] (table limit-350 lbs)    Order Specific Question:   Call Results- Best Contact Number?    Answer:   [phone]    Order  Specific Question:   Radiology Contrast Protocol - do NOT remove file path    Answer:   \\[email or web address]  . MR LUMBAR SPINE W WO CONTRAST    Standing Status:   Future    Standing Expiration Date:   01/17/2019    Order Specific Question:   If indicated for the ordered procedure, I authorize the administration of contrast media per Radiology protocol    Answer:   Yes    Order Specific Question:   What is the patient's sedation requirement?    Answer:   No Sedation    Order Specific Question:   Does the patient have a pacemaker or implanted devices?    Answer:   No    Order Specific Question:   Radiology Contrast Protocol - do NOT remove file path    Answer:   \\  [email or web address]    Order Specific Question:   Preferred imaging location?    Answer:   [facility] (table limit-350 lbs)    All questions were answered. The patient knows to call the clinic with any problems, questions or concerns. We can certainly see the patient much sooner if necessary.  A total of (30) minutes of face-to-face time was spent with this patient with greater than 50% of that time in counseling and care-coordination.  This note was electronically signed. Scot Dock, NP 11/18/2017   Attending Note  I personally saw and examined [name]. The plan of care was discussed with her. I agree with the assessment and plan as documented above. Severe intractable mid back pain: Patient was set up for an urgent MRI.  The MRI did not show any cord compression although there was evidence of tumor in the spine without cord compression.  Patient was urgently set up to see radiation oncology who planned to start palliative radiation therapy to the spine. Patient was in tears because of the sudden onset of pain. I discussed her case with [facility] immunotherapy division and she is not eligible for their immunotherapy trial because she has 2 primary cancers. We still  have a referral placed for [facility] medical oncology division to see if she qualifies for any clinical trials. If she does not then I plan to treat her with immunotherapy with pembrolizumab on a compassionate use basis. Signed Harriette Ohara, MD

## 2017-11-18 NOTE — Assessment & Plan Note (Addendum)
Right breast invasive ductal carcinoma multifocal, multicentric disease with at least 8 tumors, 2.3 cm, 1.6 cm of the biggest tumors in addition 6 more tumors 1 cm or less spanning 13.7 cm, right axillary lymph node biopsy positive (ER 22%); primary tumor is ER 0%, PR 0%, HER-2 negative, Ki-67 77% and 73% respectively, grade 3 T3 N1 M0 equals stage IIIa, Grade 3 1. Rt mastectomy 07/28/15: Multifocal IDC 2 cm, 1.5 cm. 0.6 cm; 3/13 LN positive, T1c N1 M0 stage IIA, ER/PR HER-2 negative. 2. Adjuvant XRT 09/03/15 to 10/13/15 3. Adjuvant Xeloda X 6 months (8 cycles) started 11/15/2015 completed July 2017 4. Carboplatin and gemcitabine started 03/14/2017  Current treatment: [place] day 1 day 8 every 3 weeks started 09/12/2017, today cycle 1 day 8 ----------------------------------------------------------------------- Rt Axillary LN Biopsy: Triple negative right breast cancer Bronchoscopic Biopsy at [facility]: Poorly differentiated metastatic adenocarcinoma triple negative  Severe right brachial plexus pain: Treated with palliative radiation with oral Xeloda concurrently  Current Treatment: changing to Pembrolizumab  CT CAP: 11/04/17: Progression of disease: Inc in Rt Int mammary LN, inc in Rt Axillary ST mass . New ST nodules upper lateral margin Rt Breast implant New Lt Pleural mass, new and enlarging Liver mets, Sign inc in bone mets  Back pain: Phyllis Pruitt has significant back pain.  Due to the increase in her bony metastases on recent CT scan, she needs a STAT thoracic and lumbar MRI to rule out cord compression.  I reviewed with her that her CT scan has told us that she has significant disease in her bones, and that the MRI will look more in detail at her spine and her spinal cord.  She is very tearful with this news.  I refilled her Oxycodone today, and gave her #120 for her to take 1-2 every 4 hours as needed.    I reviewed the above with Dr. Gudena, who saw the patient as well.  He also recommended that  she have 2mg IV dilaudid before the MRI today at 11 am, and 10mg IV Dexamethasone due to the pain.  I ensured that [name]'s husband is coming to drive her home with the Dilaudid in her system.  We will also likely be consulting radiation, however will wait until we have results from her MRI.     

## 2017-11-18 NOTE — Progress Notes (Signed)
Called and spoke with Tedra Coupe from [facility] imaging regarding sending out a CD overnight via Fedex to be sent to [facility]  Please send CDs of the 3 most recent sets of scans. These can be sent to the address below, using our FED EX charge # [phone] or you can upload the scans electronically by following the directions in the link [email or web address]  Please enter [email or web address] when prompted to enter protocol number. Once the imaging is uploaded, please send an e-mail so we will be aware of the upload.  With address:  Derald Macleod (c) [facility] [place], [facility], Immunotherapy [address] [place], [place] [address] [phone]  [phone] Pager# [place] verified the last 3 recent scans that were placed in the CD to be sent out, as well as the address. Notified Dr.Gudena and is aware. Receipt of other medical records to [facility] were confirmed by Dr.Gudena this morning.

## 2017-11-18 NOTE — Telephone Encounter (Signed)
Error

## 2017-11-19 ENCOUNTER — Ambulatory Visit
Admission: RE | Admit: 2017-11-19 | Discharge: 2017-11-19 | Disposition: A | Discharge status: Home / Self Care | Payer: 59 | Source: Ambulatory Visit | Attending: Radiation Oncology | Admitting: Radiation Oncology

## 2017-11-19 ENCOUNTER — Telehealth: Payer: Self-pay | Admitting: Radiation Oncology

## 2017-11-19 ENCOUNTER — Encounter: Payer: Self-pay | Admitting: Radiation Oncology

## 2017-11-19 ENCOUNTER — Inpatient Hospital Stay: Admission: RE | Admit: 2017-11-19 | Payer: Self-pay | Source: Ambulatory Visit

## 2017-11-19 DIAGNOSIS — Z510 Encounter for antineoplastic radiation therapy: Secondary | ICD-10-CM | POA: Diagnosis not present

## 2017-11-19 NOTE — Telephone Encounter (Signed)
I called the patient and could not get through. I spoke with the patient's husband who states she does not want to have this done. I let him know that Dr. [name] and Mendel Ryder were in agreement to proceed. He will let his wife know our recommendations and try to have her reach out today.      Carola Rhine, PAC

## 2017-11-19 NOTE — Progress Notes (Signed)
I spoke with the patient regarding her decision to forgo MRI of the brain.  She has made this decision because she has been able to keep down when she is eaten today, and has not noticed any additional nausea.  I let her know that this could be because of her steroids that she is been taking as well, but she also states, that she has not had any headaches, changes in visual acuity or auditory acuity.  I encouraged her to keep me informed if any of this change, or if she had additional episodes of nausea.  She states agreement and understanding.  I discussed that the recommendation would still be to proceed with the MRI scan, and if she changes her mind she will call us back, and we will be seeing her at the end of the week for her undertreatment visit with Dr. Lisbeth Renshaw.  She does understand the risks of not proceeding with scan, if there is underlying brain disease.    Carola Rhine, PAC

## 2017-11-20 ENCOUNTER — Ambulatory Visit
Admission: RE | Admit: 2017-11-20 | Discharge: 2017-11-20 | Disposition: A | Discharge status: Home / Self Care | Payer: 59 | Source: Ambulatory Visit | Attending: Radiation Oncology | Admitting: Radiation Oncology

## 2017-11-20 DIAGNOSIS — Z510 Encounter for antineoplastic radiation therapy: Secondary | ICD-10-CM | POA: Diagnosis not present

## 2017-11-21 ENCOUNTER — Other Ambulatory Visit: Payer: 59

## 2017-11-21 ENCOUNTER — Ambulatory Visit: Payer: 59

## 2017-11-21 ENCOUNTER — Ambulatory Visit
Admission: RE | Admit: 2017-11-21 | Discharge: 2017-11-21 | Disposition: A | Discharge status: Home / Self Care | Payer: 59 | Source: Ambulatory Visit | Attending: Radiation Oncology | Admitting: Radiation Oncology

## 2017-11-21 DIAGNOSIS — C50411 Malignant neoplasm of upper-outer quadrant of right female breast: Secondary | ICD-10-CM

## 2017-11-21 DIAGNOSIS — Z510 Encounter for antineoplastic radiation therapy: Secondary | ICD-10-CM | POA: Diagnosis not present

## 2017-11-21 DIAGNOSIS — Z171 Estrogen receptor negative status [ER-]: Principal | ICD-10-CM

## 2017-11-21 MED ORDER — PROCHLORPERAZINE MALEATE 10 MG PO TABS: 10.0000 mg | ORAL_TABLET | Freq: Four times a day (QID) | ORAL | 1 refills | Status: AC | PRN

## 2017-11-21 NOTE — Progress Notes (Signed)
[place]  with [facility] made aware that her insurance approved the ondansetron she plans to purchase it this afternoon.

## 2017-11-24 ENCOUNTER — Ambulatory Visit
Admission: RE | Admit: 2017-11-24 | Discharge: 2017-11-24 | Disposition: A | Discharge status: Home / Self Care | Payer: 59 | Source: Ambulatory Visit | Attending: Radiation Oncology | Admitting: Radiation Oncology

## 2017-11-24 DIAGNOSIS — Z510 Encounter for antineoplastic radiation therapy: Secondary | ICD-10-CM | POA: Diagnosis not present

## 2017-11-25 ENCOUNTER — Ambulatory Visit
Admission: RE | Admit: 2017-11-25 | Discharge: 2017-11-25 | Disposition: A | Discharge status: Home / Self Care | Payer: 59 | Source: Ambulatory Visit | Attending: Radiation Oncology | Admitting: Radiation Oncology

## 2017-11-25 DIAGNOSIS — Z510 Encounter for antineoplastic radiation therapy: Secondary | ICD-10-CM | POA: Diagnosis not present

## 2017-11-26 ENCOUNTER — Inpatient Hospital Stay (HOSPITAL_BASED_OUTPATIENT_CLINIC_OR_DEPARTMENT_OTHER): Payer: 59 | Admitting: Hematology and Oncology

## 2017-11-26 ENCOUNTER — Ambulatory Visit
Admission: RE | Admit: 2017-11-26 | Discharge: 2017-11-26 | Disposition: A | Discharge status: Home / Self Care | Payer: 59 | Source: Ambulatory Visit | Attending: Radiation Oncology | Admitting: Radiation Oncology

## 2017-11-26 VITALS — BP 162/87 | HR 99 | Temp 97.9°F | Resp 18 | Ht 65.0 in | Wt 150.6 lb

## 2017-11-26 DIAGNOSIS — L989 Disorder of the skin and subcutaneous tissue, unspecified: Secondary | ICD-10-CM

## 2017-11-26 DIAGNOSIS — Z5112 Encounter for antineoplastic immunotherapy: Secondary | ICD-10-CM | POA: Diagnosis not present

## 2017-11-26 DIAGNOSIS — Z171 Estrogen receptor negative status [ER-]: Secondary | ICD-10-CM | POA: Diagnosis not present

## 2017-11-26 DIAGNOSIS — C50411 Malignant neoplasm of upper-outer quadrant of right female breast: Secondary | ICD-10-CM | POA: Diagnosis not present

## 2017-11-26 DIAGNOSIS — N9089 Other specified noninflammatory disorders of vulva and perineum: Secondary | ICD-10-CM

## 2017-11-26 DIAGNOSIS — C773 Secondary and unspecified malignant neoplasm of axilla and upper limb lymph nodes: Secondary | ICD-10-CM | POA: Diagnosis not present

## 2017-11-26 DIAGNOSIS — Z510 Encounter for antineoplastic radiation therapy: Secondary | ICD-10-CM | POA: Diagnosis not present

## 2017-11-26 MED ORDER — LIDOCAINE-PRILOCAINE 2.5-2.5 % EX CREA: TOPICAL_CREAM | CUTANEOUS | 3 refills | Status: AC

## 2017-11-26 NOTE — Progress Notes (Signed)
Patient Care Team: [name], MD as PCP - General (Family Medicine) Magrinat, Virgie Dad, MD as Consulting Physician (Oncology) Thea Silversmith, MD (Inactive) as Consulting Physician (Radiation Oncology) Mauro Kaufmann, RN as Registered Nurse Rockwell Germany, RN as Registered Nurse Alphonsa Overall, MD as Consulting Physician (General Surgery) Sylvan Cheese, NP as Nurse Practitioner (Hematology and Oncology)  DIAGNOSIS:  Encounter Diagnosis  Name Primary?  . Malignant neoplasm of upper-outer quadrant of right breast in female, estrogen receptor negative ([place]) Yes    SUMMARY OF ONCOLOGIC HISTORY:   Breast cancer of upper-outer quadrant of right female breast ([place])   01/19/2015 Mammogram    Right breast: density with indistinct margin at 12:00, posterior depth; also oval mass with obscured margin at 11:00, middle depth; irregular mass with indistinct margin central to nipple, middle depth      01/23/2015 Initial Biopsy    Right breast 9:00 and 10:00 biopsy ([facility]): Grade 3 IDC ER/PR negative, HER-2 negative, Ki-67 77% and 73%; the right axillary lymph node biopsy: ER 22% positive      01/30/2015 Breast MRI    Right breast masses 9 and 10:00 position, additional irregular masses with non-mass enhancement upper outer quadrant with one mass extending into upper inner quadrant, multifocal, multicentric 13.7 x 4.6 x 4.6 cm, malignant right axillary lymph node      02/08/2015 Clinical Stage    Stage IIIA: T3, N1, M0      02/09/2015 Procedure    BreastNext panel revealed VUS BRCA2 gene called p.[id number] (c.[id number]>G); otherwise no clinically significant variant at ATM, BARD1, BRCA1, BRCA2, BRIP1, CDH1, CHEK2, MRE11A, MUTYH, NBN, NF1, PALB2, PTEN, RAD50, RAD51C, RAD51D, and TP53      02/15/2015 Imaging    No evidence of metastatic disease, 2.2 cm enhancing lesion anterior left lower kidney suspicious for solid renal neoplasm      02/20/2015 - 07/10/2015 Neo-Adjuvant Chemotherapy      dose dense doxorubicin and cyclophosphamide x 4 followed by Abraxane weekly x 12      07/10/2015 Breast MRI    Multiple irregular enhancing masses right breast are less confluent and smaller dominant mass 10:00 2.1 x 1.9 x 1.3 cm; right breast 11:00 mass measures 1.2 x 1.2 x 0.8 cm, decreased right axillary lymph node thickening      07/28/2015 Definitive Surgery    Rt mastectomy: Multifocal IDC 2 cm, 1.5 cm. 0.6 cm; 3/13 LN positive, ER/PR HER-2      07/28/2015 Pathologic Stage    Stage IIA: T1c N1 M0        09/03/2015 - 10/13/2015 Radiation Therapy    Adjuvant radiation therapy: Right chest wall/50.4 Pearline Cables @ 1.8 Gray per fraction x 28 fractions. Right supraclavicular fossa 45 Gy _0 .8 Gy per fraction x 25 fractions      11/15/2015 - 05/10/2016 Chemotherapy    Adjuvant capecitabine - completed 8 cycles      11/16/2015 Survivorship    Survivorship visit completed and copy of care plan given to patient      02/23/2016 Procedure    Left mandible biopsy: Scant material, no malignancy      05/21/2016 -  Anti-estrogen oral therapy    Patient did not take tamoxifen because her ER was weakly positive at 22% and she was worried about toxicities      02/21/2017 Imaging    Rt Axilla 8 X 7 X 7 mm mass inv pectoralis musculature, additional mass 9 mm 1.5 cm apart      02/21/2017  Relapse/Recurrence    Right axillary mass biopsy: 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      03/24/2017 Miscellaneous    Foundation 1 analysis: Microsatellite stable; TMB 9 muts/Mb; MYC amplification, PIK3C2B p717L; TP53 [id number]      03/25/2017 - 07/15/2017 Chemotherapy    Carboplatin and gemcitabine palliative chemotherapy      07/28/2017 PET scan    Progression of metastatic disease new liver lesion on 0.6 cm SUV 7.2, previous liver lesion 1.4 cm unchanged but shows no activity; scattered hypermetabolic bone lesions some of which are new, scattered pulmonary nodules stable hypermetabolic right subpectoral  and mediastinal/internal mammary nodes stable      08/22/2017 - 09/10/2017 Radiation Therapy    Palliative radiation to brachial plexus concurrently with Xeloda      09/12/2017 -  Chemotherapy    Halaven day 1 day 8 every 3 weeks        Renal cell carcinoma of left kidney (HCC)   01/29/2016 Surgery    Left renal mass partial resection: 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  She has 1 more week of radiation left.  She is here today to discuss what treatment options he may have after radiation is complete.  She is feeling a lot better with less amount of pain.  REVIEW OF SYSTEMS:   Constitutional: Denies fevers, chills or abnormal weight loss Eyes: Denies blurriness of vision Ears, nose, mouth, throat, and face: Denies mucositis or sore throat Respiratory: Denies cough, dyspnea or wheezes Cardiovascular: Denies palpitation, chest discomfort Gastrointestinal:  Denies nausea, heartburn or change in bowel habits Skin: Denies abnormal skin rashes Lymphatics: Denies new lymphadenopathy or easy bruising Neurological:Denies numbness, tingling or new weaknesses Behavioral/Psych: Mood is stable, no new changes  Extremities: Right upper extremity pain discomfort and swelling  All other systems were reviewed with the patient and are negative.  I have reviewed the past medical history, past surgical history, social history and family history with the patient and they are unchanged from previous note.  ALLERGIES:  is allergic to sulfa antibiotics.  MEDICATIONS:  Current Outpatient  Medications  Medication Sig Dispense Refill  . benazepril (LOTENSIN) 10 MG tablet Take 1 tablet (10 mg total) by mouth daily.    . Black Pepper-Turmeric 3-500 MG CAPS Take 1 capsule by mouth daily.    . dexamethasone (DECADRON) 4 MG tablet Take 1 tablet (4 mg total) by mouth 2 (two) times daily. 60 tablet 1  . gabapentin (NEURONTIN) 300 MG capsule Take 1 capsule (300 mg total) by mouth 3 (three) times daily. 90 capsule 3  . lidocaine-prilocaine (EMLA) cream Apply to affected area once 30 g 3  . lidocaine-prilocaine (EMLA) cream Apply to affected area once 30 g 3  . Multiple Vitamins-Minerals (MULTIVITAMIN) tablet Take 1 tablet by mouth daily.    . oxyCODONE (OXY IR/ROXICODONE) 5 MG immediate release tablet Take 1-2 tablets (5-10 mg total) by mouth every 4 (four) hours as needed for severe pain. 120 tablet 0  . potassium chloride SA (K-DUR,KLOR-CON) 20 MEQ tablet TAKE 1 TABLET(20 MEQ) BY MOUTH DAILY 30 tablet 0  .   Probiotic Product (PROBIOTIC PO) Take 1 tablet by mouth daily.    . prochlorperazine (COMPAZINE) 10 MG tablet Take 1 tablet (10 mg total) by mouth every 6 (six) hours as needed for nausea or vomiting. 40 tablet 1  . rivaroxaban (XARELTO) 20 MG TABS tablet Take 1 tablet (20 mg total) by mouth daily with supper. 90 tablet 1   No current facility-administered medications for this visit.     PHYSICAL EXAMINATION: ECOG PERFORMANCE STATUS: 1 - Symptomatic but completely ambulatory  Vitals:   11/26/17 1612  BP: (!) 162/87  Pulse: 99  Resp: 18  Temp: 97.9 F (36.6 C)  SpO2: 100%   Filed Weights   11/26/17 1612  Weight: 150 lb 9.6 oz (68.3 kg)    GENERAL:alert, no distress and comfortable SKIN: skin color, texture, turgor are normal, no rashes or significant lesions EYES: normal, Conjunctiva are pink and non-injected, sclera clear OROPHARYNX:no exudate, no erythema and lips, buccal mucosa, and tongue normal  NECK: supple, thyroid normal size, non-tender, without  nodularity LYMPH:  no palpable lymphadenopathy in the cervical, axillary or inguinal LUNGS: clear to auscultation and percussion with normal breathing effort HEART: regular rate & rhythm and no murmurs and no lower extremity edema ABDOMEN:abdomen soft, non-tender and normal bowel sounds MUSCULOSKELETAL:no cyanosis of digits and no clubbing  NEURO: alert & oriented x 3 with fluent speech, no focal motor/sensory deficits EXTREMITIES: Right upper extremity numbness and weakness and swelling  LABORATORY DATA:  I have reviewed the data as listed CMP Latest Ref Rng & Units 11/13/2017 10/31/2017 10/24/2017  Glucose 70 - 140 mg/dL 89 96 86  BUN 7 - 26 mg/dL 6(L) 9 6(L)  Creatinine 0.60 - 1.10 mg/dL 0.71 0.74 0.80  Sodium 136 - 145 mmol/L 140 138 142  Potassium 3.5 - 5.1 mmol/L 3.6 3.5 3.5  Chloride 98 - 109 mmol/L 106 105 106  CO2 22 - 29 mmol/L 26 24 27  Calcium 8.4 - 10.4 mg/dL 9.4 9.6 9.5  Total Protein 6.4 - 8.3 g/dL 7.4 7.8 7.5  Total Bilirubin 0.2 - 1.2 mg/dL 0.5 0.3 0.4  Alkaline Phos 40 - 150 U/L 229(H) 248(H) 248(H)  AST 5 - 34 U/L 30 30 25  ALT 0 - 55 U/L 18 17 17    Lab Results  Component Value Date   WBC 3.9 11/13/2017   HGB 9.7 (L) 11/13/2017   HCT 29.6 (L) 11/13/2017   MCV 83.6 11/13/2017   PLT 347 11/13/2017   NEUTROABS 2.5 11/13/2017    ASSESSMENT & PLAN:  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 Rt mastectomy 07/28/15: Multifocal IDC 2 cm, 1.5 cm. 0.6 cm; 3/13 LN positive, T1c N1 M0 stage IIA, ER/PR HER-2 negative. 2. Adjuvant XRT 09/03/15 to 10/13/15 3. Adjuvant Xeloda X 6 months (8  cycles) started 11/15/2015 completed July 2017 4. Carboplatin and gemcitabine started 03/14/2017 5. Halaven day 1 day 8 every 3 weeks started 09/12/2017-10/31/17 6.  Palliative radiation therapy to we will were lesion as well as subcutaneous lesions in the back  Treatment plan: Pembrolizumab immunotherapy: We will request compassionate use. Return to   clinic in 1 week to start immunotherapy. Goals of care: Palliation I discussed with [facility] regarding the clinical trial option and she was not felt to be a candidate for that. Return to clinic next Wednesday to start pembrolizumab immunotherapy. After 3 cycles we will obtain scans.  I spent 25 minutes talking to the patient of which more than half was spent in counseling and coordination of care.  Orders Placed This Encounter  Procedures  . CBC with Differential (Cancer Center Only)    Standing Status:   Standing    Number of Occurrences:   20    Standing Expiration Date:   11/26/2018  . CMP ([place] only)    Standing Status:   Standing    Number of Occurrences:   20    Standing Expiration Date:   11/26/2018   The patient has a good understanding of the overall plan. she agrees with it. she will call with any problems that may develop before the next visit here.   Harriette Ohara, MD 11/26/17

## 2017-11-26 NOTE — Assessment & Plan Note (Signed)
Right breast invasive ductal carcinoma multifocal, multicentric disease with at least 8 tumors, 2.3 cm, 1.6 cm of the biggest tumors in addition 6 more tumors 1 cm or less spanning 13.7 cm, right axillary lymph node biopsy positive (ER 22%); primary tumor is ER 0%, PR 0%, HER-2 negative, Ki-67 77% and 73% respectively, grade 3 T3 N1 M0 equals stage IIIa, Grade 3 1. Rt mastectomy 07/28/15: Multifocal IDC 2 cm, 1.5 cm. 0.6 cm; 3/13 LN positive, T1c N1 M0 stage IIA, ER/PR HER-2 negative. 2. Adjuvant XRT 09/03/15 to 10/13/15 3. Adjuvant Xeloda X 6 months (8 cycles) started 11/15/2015 completed July 2017 4. Carboplatin and gemcitabine started 03/14/2017 5. [place] day 1 day 8 every 3 weeks started 09/12/2017-10/31/17 6.  Palliative radiation therapy to we will were lesion as well as subcutaneous lesions in the back  Treatment plan: Pembrolizumab immunotherapy: We will request compassionate use. Return to clinic in 1 week to start immunotherapy. Goals of care: Palliation I discussed with [facility] regarding the clinical trial option and she was not felt to be a candidate for that.

## 2017-11-26 NOTE — Progress Notes (Signed)
DISCONTINUE ON PATHWAY REGIMEN - Breast     A cycle is every 21 days:     Eribulin mesylate   **Always confirm dose/schedule in your pharmacy ordering system**    REASON: Disease Progression PRIOR TREATMENT: [id number]: Eribulin 1.4 mg/m2 D1, 8 q21 Days Until Progression or Unacceptable Toxicity TREATMENT RESPONSE: Progressive Disease (PD)  START OFF PATHWAY REGIMEN - Breast   [id number]:Pembrolizumab 2 mg/kg q21 Days:   A cycle is every 21 days:     Pembrolizumab   **Always confirm dose/schedule in your pharmacy ordering system**    Patient Characteristics: Distant Metastases or Locoregional Recurrent Disease - Unresected, HER2 Negative/Unknown/Equivocal, ER Negative/Unknown, Chemotherapy, [facility], Prior Eribulin Therapeutic Status: Distant Metastases BRCA Mutation Status: Absent ER Status: Negative (-) HER2 Status: Negative (-) Would you be surprised if this patient died  in the next year<= I would be surprised if this patient died in the next year PR Status: Negative (-) Line of therapy: [facility] and Beyond Intent of Therapy: Non-Curative / Palliative Intent, Discussed with Patient 

## 2017-11-27 ENCOUNTER — Ambulatory Visit
Admission: RE | Admit: 2017-11-27 | Discharge: 2017-11-27 | Disposition: A | Discharge status: Home / Self Care | Payer: 59 | Source: Ambulatory Visit | Attending: Radiation Oncology | Admitting: Radiation Oncology

## 2017-11-27 DIAGNOSIS — Z510 Encounter for antineoplastic radiation therapy: Secondary | ICD-10-CM | POA: Diagnosis not present

## 2017-11-28 ENCOUNTER — Ambulatory Visit
Admission: RE | Admit: 2017-11-28 | Discharge: 2017-11-28 | Disposition: A | Discharge status: Home / Self Care | Payer: 59 | Source: Ambulatory Visit | Attending: Radiation Oncology | Admitting: Radiation Oncology

## 2017-11-28 DIAGNOSIS — Z510 Encounter for antineoplastic radiation therapy: Secondary | ICD-10-CM | POA: Diagnosis not present

## 2017-12-01 ENCOUNTER — Telehealth: Payer: Self-pay | Admitting: Hematology and Oncology

## 2017-12-01 ENCOUNTER — Ambulatory Visit
Admission: RE | Admit: 2017-12-01 | Discharge: 2017-12-01 | Disposition: A | Discharge status: Home / Self Care | Payer: 59 | Source: Ambulatory Visit | Attending: Radiation Oncology | Admitting: Radiation Oncology

## 2017-12-01 DIAGNOSIS — Z510 Encounter for antineoplastic radiation therapy: Secondary | ICD-10-CM | POA: Diagnosis not present

## 2017-12-01 NOTE — Telephone Encounter (Signed)
Patient scheduled for appointments per 2/13 LOS

## 2017-12-02 ENCOUNTER — Ambulatory Visit
Admission: RE | Admit: 2017-12-02 | Discharge: 2017-12-02 | Disposition: A | Discharge status: Home / Self Care | Payer: 59 | Source: Ambulatory Visit | Attending: Radiation Oncology | Admitting: Radiation Oncology

## 2017-12-02 DIAGNOSIS — Z510 Encounter for antineoplastic radiation therapy: Secondary | ICD-10-CM | POA: Diagnosis not present

## 2017-12-03 ENCOUNTER — Inpatient Hospital Stay (HOSPITAL_BASED_OUTPATIENT_CLINIC_OR_DEPARTMENT_OTHER): Payer: 59 | Admitting: Hematology and Oncology

## 2017-12-03 ENCOUNTER — Inpatient Hospital Stay: Payer: 59

## 2017-12-03 ENCOUNTER — Telehealth: Payer: Self-pay | Admitting: Hematology and Oncology

## 2017-12-03 VITALS — BP 133/93 | HR 97 | Temp 97.7°F | Resp 17 | Ht 65.0 in | Wt 146.8 lb

## 2017-12-03 DIAGNOSIS — L989 Disorder of the skin and subcutaneous tissue, unspecified: Secondary | ICD-10-CM

## 2017-12-03 DIAGNOSIS — C773 Secondary and unspecified malignant neoplasm of axilla and upper limb lymph nodes: Secondary | ICD-10-CM

## 2017-12-03 DIAGNOSIS — I82621 Acute embolism and thrombosis of deep veins of right upper extremity: Secondary | ICD-10-CM

## 2017-12-03 DIAGNOSIS — Z171 Estrogen receptor negative status [ER-]: Principal | ICD-10-CM

## 2017-12-03 DIAGNOSIS — C50411 Malignant neoplasm of upper-outer quadrant of right female breast: Secondary | ICD-10-CM | POA: Diagnosis not present

## 2017-12-03 DIAGNOSIS — C7951 Secondary malignant neoplasm of bone: Secondary | ICD-10-CM

## 2017-12-03 DIAGNOSIS — E876 Hypokalemia: Secondary | ICD-10-CM | POA: Diagnosis not present

## 2017-12-03 DIAGNOSIS — Z5112 Encounter for antineoplastic immunotherapy: Secondary | ICD-10-CM | POA: Diagnosis not present

## 2017-12-03 LAB — CBC WITH DIFFERENTIAL (CANCER CENTER ONLY)
BASOS PCT: 0 %
Basophils Absolute: 0 10*3/uL (ref 0.0–0.1)
EOS ABS: 0.1 10*3/uL (ref 0.0–0.5)
Eosinophils Relative: 1 %
HCT: 33 % — ABNORMAL LOW (ref 34.8–46.6)
Hemoglobin: 10.6 g/dL — ABNORMAL LOW (ref 11.6–15.9)
LYMPHS ABS: 0.6 10*3/uL — AB (ref 0.9–3.3)
Lymphocytes Relative: 6 %
MCH: 27 pg (ref 25.1–34.0)
MCHC: 32.1 g/dL (ref 31.5–36.0)
MCV: 84.2 fL (ref 79.5–101.0)
Monocytes Absolute: 0.5 10*3/uL (ref 0.1–0.9)
Monocytes Relative: 5 %
Neutro Abs: 9 10*3/uL — ABNORMAL HIGH (ref 1.5–6.5)
Neutrophils Relative %: 88 %
Platelet Count: 314 10*3/uL (ref 145–400)
RBC: 3.92 MIL/uL (ref 3.70–5.45)
RDW: 19.2 % — ABNORMAL HIGH (ref 11.2–14.5)
WBC Count: 10.1 10*3/uL (ref 3.9–10.3)

## 2017-12-03 LAB — CMP (CANCER CENTER ONLY)
ALT: 38 U/L (ref 0–55)
AST: 33 U/L (ref 5–34)
Albumin: 3 g/dL — ABNORMAL LOW (ref 3.5–5.0)
Alkaline Phosphatase: 309 U/L — ABNORMAL HIGH (ref 40–150)
Anion gap: 12 — ABNORMAL HIGH (ref 3–11)
BILIRUBIN TOTAL: 0.3 mg/dL (ref 0.2–1.2)
BUN: 16 mg/dL (ref 7–26)
CO2: 26 mmol/L (ref 22–29)
CREATININE: 0.74 mg/dL (ref 0.60–1.10)
Calcium: 9.5 mg/dL (ref 8.4–10.4)
Chloride: 104 mmol/L (ref 98–109)
GFR, Est AFR Am: >60 mL/min (ref >60)
GFR, Estimated: >60 mL/min (ref >60)
Glucose, Bld: 94 mg/dL (ref 70–140)
POTASSIUM: 3.8 mmol/L (ref 3.5–5.1)
Sodium: 142 mmol/L (ref 136–145)
TOTAL PROTEIN: 7.7 g/dL (ref 6.4–8.3)

## 2017-12-03 MED ORDER — SODIUM CHLORIDE 0.9 % IV SOLN
Freq: Once | INTRAVENOUS | Status: AC
  Administered 2017-12-03: 15:00:00 via INTRAVENOUS

## 2017-12-03 MED ORDER — PALONOSETRON HCL INJECTION 0.25 MG/5ML: 0.2500 mg | Freq: Once | INTRAVENOUS | Status: DC

## 2017-12-03 MED ORDER — SODIUM CHLORIDE 0.9% FLUSH
10.0000 mL | INTRAVENOUS | Status: DC | PRN
  Administered 2017-12-03: 10 mL
  Filled 2017-12-03: qty 10

## 2017-12-03 MED ORDER — SODIUM CHLORIDE 0.9 % IV SOLN
200.0000 mg | Freq: Once | INTRAVENOUS | Status: AC
  Administered 2017-12-03: 200 mg via INTRAVENOUS
  Filled 2017-12-03: qty 8

## 2017-12-03 MED ORDER — HEPARIN SOD (PORK) LOCK FLUSH 100 UNIT/ML IV SOLN
500.0000 [IU] | Freq: Once | INTRAVENOUS | Status: AC | PRN
  Administered 2017-12-03: 500 [IU]
  Filled 2017-12-03: qty 5

## 2017-12-03 NOTE — Patient Instructions (Addendum)
[facility] Discharge Instructions for Patients Receiving Chemotherapy  Today you received the following chemotherapy agents Keytruda  To help prevent nausea and vomiting after your treatment, we encourage you to take your nausea medication as directed. No Zofran for 3 days, take Compazine instead.   If you develop nausea and vomiting that is not controlled by your nausea medication, call the clinic.   BELOW ARE SYMPTOMS THAT SHOULD BE REPORTED IMMEDIATELY:  *FEVER GREATER THAN 100.5 F  *CHILLS WITH OR WITHOUT FEVER  NAUSEA AND VOMITING THAT IS NOT CONTROLLED WITH YOUR NAUSEA MEDICATION  *UNUSUAL SHORTNESS OF BREATH  *UNUSUAL BRUISING OR BLEEDING  TENDERNESS IN MOUTH AND THROAT WITH OR WITHOUT PRESENCE OF ULCERS  *URINARY PROBLEMS  *BOWEL PROBLEMS  UNUSUAL RASH Items with * indicate a potential emergency and should be followed up as soon as possible.  Feel free to call the clinic should you have any questions or concerns. The clinic phone number is [phone].  Please show the [facility] at check-in to the Emergency Department and triage nurse.  Pembrolizumab injection What is this medicine? PEMBROLIZUMAB (pem broe liz ue mab) is a monoclonal antibody. It is used to treat melanoma, head and neck cancer, Hodgkin lymphoma, non-small cell lung cancer, urothelial cancer, stomach cancer, and cancers that have a certain genetic condition. This medicine may be used for other purposes; ask your health care provider or pharmacist if you have questions. COMMON BRAND NAME(S): Keytruda What should I tell my health care provider before I take this medicine? They need to know if you have any of these conditions: -diabetes -immune system problems -inflammatory bowel disease -liver disease -lung or breathing disease -lupus -organ transplant -an unusual or allergic reaction to pembrolizumab, other medicines, foods, dyes, or preservatives -pregnant or trying to  get pregnant -breast-feeding How should I use this medicine? This medicine is for infusion into a vein. It is given by a health care professional in a hospital or clinic setting. A special MedGuide will be given to you before each treatment. Be sure to read this information carefully each time. Talk to your pediatrician regarding the use of this medicine in children. While this drug may be prescribed for selected conditions, precautions do apply. Overdosage: If you think you have taken too much of this medicine contact a poison control center or emergency room at once. NOTE: This medicine is only for you. Do not share this medicine with others. What if I miss a dose? It is important not to miss your dose. Call your doctor or health care professional if you are unable to keep an appointment. What may interact with this medicine? Interactions have not been studied. Give your health care provider a list of all the medicines, herbs, non-prescription drugs, or dietary supplements you use. Also tell them if you smoke, drink alcohol, or use illegal drugs. Some items may interact with your medicine. This list may not describe all possible interactions. Give your health care provider a list of all the medicines, herbs, non-prescription drugs, or dietary supplements you use. Also tell them if you smoke, drink alcohol, or use illegal drugs. Some items may interact with your medicine. What should I watch for while using this medicine? Your condition will be monitored carefully while you are receiving this medicine. You may need blood work done while you are taking this medicine. Do not become pregnant while taking this medicine or for 4 months after stopping it. Women should inform their doctor if they  wish to become pregnant or think they might be pregnant. There is a potential for serious side effects to an unborn child. Talk to your health care professional or pharmacist for more information. Do not  breast-feed an infant while taking this medicine or for 4 months after the last dose. What side effects may I notice from receiving this medicine? Side effects that you should report to your doctor or health care professional as soon as possible: -allergic reactions like skin rash, itching or hives, swelling of the face, lips, or tongue -bloody or black, tarry -breathing problems -changes in vision -chest pain -chills -constipation -cough -dizziness or feeling faint or lightheaded -fast or irregular heartbeat -fever -flushing -hair loss -low blood counts - this medicine may decrease the number of white blood cells, red blood cells and platelets. You may be at increased risk for infections and bleeding. -muscle pain -muscle weakness -persistent headache -signs and symptoms of high blood sugar such as dizziness; dry mouth; dry skin; fruity breath; nausea; stomach pain; increased hunger or thirst; increased urination -signs and symptoms of kidney injury like trouble passing urine or change in the amount of urine -signs and symptoms of liver injury like dark urine, light-colored stools, loss of appetite, nausea, right upper belly pain, yellowing of the eyes or skin -stomach pain -sweating -weight loss Side effects that usually do not require medical attention (report to your doctor or health care professional if they continue or are bothersome): -decreased appetite -diarrhea -tiredness This list may not describe all possible side effects. Call your doctor for medical advice about side effects. You may report side effects to FDA at 1-800-FDA-1088. Where should I keep my medicine? This drug is given in a hospital or clinic and will not be stored at home. NOTE: This sheet is a summary. It may not cover all possible information. If you have questions about this medicine, talk to your doctor, pharmacist, or health care provider.  2018 Elsevier/Gold Standard (2016-07-09  12:29:36)    Pembrolizumab injection What is this medicine? PEMBROLIZUMAB (pem broe liz ue mab) is a monoclonal antibody. It is used to treat melanoma, head and neck cancer, Hodgkin lymphoma, non-small cell lung cancer, urothelial cancer, stomach cancer, and cancers that have a certain genetic condition. This medicine may be used for other purposes; ask your health care provider or pharmacist if you have questions. COMMON BRAND NAME(S): Keytruda What should I tell my health care provider before I take this medicine? They need to know if you have any of these conditions: -diabetes -immune system problems -inflammatory bowel disease -liver disease -lung or breathing disease -lupus -organ transplant -an unusual or allergic reaction to pembrolizumab, other medicines, foods, dyes, or preservatives -pregnant or trying to get pregnant -breast-feeding How should I use this medicine? This medicine is for infusion into a vein. It is given by a health care professional in a hospital or clinic setting. A special MedGuide will be given to you before each treatment. Be sure to read this information carefully each time. Talk to your pediatrician regarding the use of this medicine in children. While this drug may be prescribed for selected conditions, precautions do apply. Overdosage: If you think you have taken too much of this medicine contact a poison control center or emergency room at once. NOTE: This medicine is only for you. Do not share this medicine with others. What if I miss a dose? It is important not to miss your dose. Call your doctor or health care professional if  you are unable to keep an appointment. What may interact with this medicine? Interactions have not been studied. Give your health care provider a list of all the medicines, herbs, non-prescription drugs, or dietary supplements you use. Also tell them if you smoke, drink alcohol, or use illegal drugs. Some items may interact  with your medicine. This list may not describe all possible interactions. Give your health care provider a list of all the medicines, herbs, non-prescription drugs, or dietary supplements you use. Also tell them if you smoke, drink alcohol, or use illegal drugs. Some items may interact with your medicine. What should I watch for while using this medicine? Your condition will be monitored carefully while you are receiving this medicine. You may need blood work done while you are taking this medicine. Do not become pregnant while taking this medicine or for 4 months after stopping it. Women should inform their doctor if they wish to become pregnant or think they might be pregnant. There is a potential for serious side effects to an unborn child. Talk to your health care professional or pharmacist for more information. Do not breast-feed an infant while taking this medicine or for 4 months after the last dose. What side effects may I notice from receiving this medicine? Side effects that you should report to your doctor or health care professional as soon as possible: -allergic reactions like skin rash, itching or hives, swelling of the face, lips, or tongue -bloody or black, tarry -breathing problems -changes in vision -chest pain -chills -constipation -cough -dizziness or feeling faint or lightheaded -fast or irregular heartbeat -fever -flushing -hair loss -low blood counts - this medicine may decrease the number of white blood cells, red blood cells and platelets. You may be at increased risk for infections and bleeding. -muscle pain -muscle weakness -persistent headache -signs and symptoms of high blood sugar such as dizziness; dry mouth; dry skin; fruity breath; nausea; stomach pain; increased hunger or thirst; increased urination -signs and symptoms of kidney injury like trouble passing urine or change in the amount of urine -signs and symptoms of liver injury like dark urine,  light-colored stools, loss of appetite, nausea, right upper belly pain, yellowing of the eyes or skin -stomach pain -sweating -weight loss Side effects that usually do not require medical attention (report to your doctor or health care professional if they continue or are bothersome): -decreased appetite -diarrhea -tiredness This list may not describe all possible side effects. Call your doctor for medical advice about side effects. You may report side effects to FDA at 1-800-FDA-1088. Where should I keep my medicine? This drug is given in a hospital or clinic and will not be stored at home. NOTE: This sheet is a summary. It may not cover all possible information. If you have questions about this medicine, talk to your doctor, pharmacist, or health care provider.  2018 Elsevier/Gold Standard (2016-07-09 12:29:36)

## 2017-12-03 NOTE — Progress Notes (Signed)
Patient Care Team: [name], MD as PCP - General (Family Medicine) Magrinat, Virgie Dad, MD as Consulting Physician (Oncology) Thea Silversmith, MD (Inactive) as Consulting Physician (Radiation Oncology) Mauro Kaufmann, RN as Registered Nurse Rockwell Germany, RN as Registered Nurse Alphonsa Overall, MD as Consulting Physician (General Surgery) Sylvan Cheese, NP as Nurse Practitioner (Hematology and Oncology)  DIAGNOSIS:  Encounter Diagnoses  Name Primary?  . Bone metastases ([place]) Yes  . Malignant neoplasm of upper-outer quadrant of right breast in female, estrogen receptor negative ([place])     SUMMARY OF ONCOLOGIC HISTORY:   Breast cancer of upper-outer quadrant of right female breast ([place])   01/19/2015 Mammogram    Right breast: density with indistinct margin at 12:00, posterior depth; also oval mass with obscured margin at 11:00, middle depth; irregular mass with indistinct margin central to nipple, middle depth      01/23/2015 Initial Biopsy    Right breast 9:00 and 10:00 biopsy ([facility]): Grade 3 IDC ER/PR negative, HER-2 negative, Ki-67 77% and 73%; the right axillary lymph node biopsy: ER 22% positive      01/30/2015 Breast MRI    Right breast masses 9 and 10:00 position, additional irregular masses with non-mass enhancement upper outer quadrant with one mass extending into upper inner quadrant, multifocal, multicentric 13.7 x 4.6 x 4.6 cm, malignant right axillary lymph node      02/08/2015 Clinical Stage    Stage IIIA: T3, N1, M0      02/09/2015 Procedure    BreastNext panel revealed VUS BRCA2 gene called [id number] (c.[id number]>G); otherwise no clinically significant variant at ATM, BARD1, BRCA1, BRCA2, BRIP1, CDH1, CHEK2, MRE11A, MUTYH, NBN, NF1, PALB2, PTEN, RAD50, RAD51C, RAD51D, and TP53      02/15/2015 Imaging    No evidence of metastatic disease, 2.2 cm enhancing lesion anterior left lower kidney suspicious for solid renal neoplasm      02/20/2015 - 07/10/2015  Neo-Adjuvant Chemotherapy    dose dense doxorubicin and cyclophosphamide x 4 followed by Abraxane weekly x 12      07/10/2015 Breast MRI    Multiple irregular enhancing masses right breast are less confluent and smaller dominant mass 10:00 2.1 x 1.9 x 1.3 cm; right breast 11:00 mass measures 1.2 x 1.2 x 0.8 cm, decreased right axillary lymph node thickening      07/28/2015 Definitive Surgery    Rt mastectomy: Multifocal IDC 2 cm, 1.5 cm. 0.6 cm; 3/13 LN positive, ER/PR HER-2      07/28/2015 Pathologic Stage    Stage IIA: T1c N1 M0        09/03/2015 - 10/13/2015 Radiation Therapy    Adjuvant radiation therapy: Right chest wall/50.4 Pearline Cables @ 1.8 Gray per fraction x 28 fractions. Right supraclavicular fossa 45 Gy _0 .8 Gy per fraction x 25 fractions      11/15/2015 - 05/10/2016 Chemotherapy    Adjuvant capecitabine - completed 8 cycles      11/16/2015 Survivorship    Survivorship visit completed and copy of care plan given to patient      02/23/2016 Procedure    Left mandible biopsy: Scant material, no malignancy      05/21/2016 -  Anti-estrogen oral therapy    Patient did not take tamoxifen because her ER was weakly positive at 22% and she was worried about toxicities      02/21/2017 Imaging    Rt Axilla 8 X 7 X 7 mm mass inv pectoralis musculature, additional mass 9 mm 1.5 cm apart  02/21/2017 Relapse/Recurrence    Right axillary mass biopsy: 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      03/24/2017 Miscellaneous    Foundation 1 analysis: Microsatellite stable; TMB 9 muts/Mb; MYC amplification, PIK3C2B p717L; TP53 [id number]      03/25/2017 - 07/15/2017 Chemotherapy    Carboplatin and gemcitabine palliative chemotherapy      07/28/2017 PET scan    Progression of metastatic disease new liver lesion on 0.6 cm SUV 7.2, previous liver lesion 1.4 cm unchanged but shows no activity; scattered hypermetabolic bone lesions some of which are new, scattered pulmonary nodules stable  hypermetabolic right subpectoral and mediastinal/internal mammary nodes stable      08/22/2017 - 09/10/2017 Radiation Therapy    Palliative radiation to brachial plexus concurrently with Xeloda      09/12/2017 - 10/31/2017 Chemotherapy    Halaven day 1 day 8 every 3 weeks       12/03/2017 -  Chemotherapy    Pembrolizumab every 3 weeks        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  She has progressed on multiple lines of therapy and we have requested a compassionate use of immunotherapy treatment.  Her major complaints are related to right hand weakness and loss of strength.  She is working with physical therapy with this.  She had a right upper extremity DVT and is currently on Xarelto.  She is having arthritis in the right knee and is seeing practitioner who is planning to give her injections into the knee.  REVIEW OF SYSTEMS:   Constitutional: Denies fevers, chills or abnormal weight loss Eyes: Denies blurriness of vision Ears, nose, mouth, throat, and face: Denies mucositis or sore throat Respiratory: Denies cough, dyspnea or wheezes Cardiovascular: Denies palpitation, chest discomfort Gastrointestinal:  Denies nausea, heartburn or change in bowel habits Skin: Denies abnormal skin rashes Lymphatics: Denies new lymphadenopathy or easy bruising Neurological: Right arm weakness from disuse atrophy Behavioral/Psych: Mood is stable, no new changes  Extremities: Right knee arthritis  All other systems were reviewed with the patient and are negative.  I have reviewed the past medical  history, past surgical history, social history and family history with the patient and they are unchanged from previous note.  ALLERGIES:  is allergic to sulfa antibiotics.  MEDICATIONS:  Current Outpatient Medications  Medication Sig Dispense Refill  . benazepril (LOTENSIN) 10 MG tablet Take 1 tablet (10 mg total) by mouth daily.    . Black Pepper-Turmeric 3-500 MG CAPS Take 1 capsule by mouth daily.    Marland Kitchen dexamethasone (DECADRON) 4 MG tablet Take 1 tablet (4 mg total) by mouth 2 (two) times daily. 60 tablet 1  . gabapentin (NEURONTIN) 300 MG capsule Take 1 capsule (300 mg total) by mouth 3 (three) times daily. 90 capsule 3  . lidocaine-prilocaine (EMLA) cream Apply to affected area once 30 g 3  . lidocaine-prilocaine (EMLA) cream Apply to affected area once 30 g 3  . Multiple Vitamins-Minerals (MULTIVITAMIN) tablet Take 1 tablet by mouth daily.    Marland Kitchen oxyCODONE (OXY IR/ROXICODONE) 5 MG immediate release tablet  Take 1-2 tablets (5-10 mg total) by mouth every 4 (four) hours as needed for severe pain. 120 tablet 0  . potassium chloride SA (K-DUR,KLOR-CON) 20 MEQ tablet TAKE 1 TABLET(20 MEQ) BY MOUTH DAILY 30 tablet 0  . Probiotic Product (PROBIOTIC PO) Take 1 tablet by mouth daily.    . prochlorperazine (COMPAZINE) 10 MG tablet Take 1 tablet (10 mg total) by mouth every 6 (six) hours as needed for nausea or vomiting. 40 tablet 1  . rivaroxaban (XARELTO) 20 MG TABS tablet Take 1 tablet (20 mg total) by mouth daily with supper. 90 tablet 1   No current facility-administered medications for this visit.     PHYSICAL EXAMINATION: ECOG PERFORMANCE STATUS: 1 - Symptomatic but completely ambulatory  Vitals:   12/03/17 1144  BP: (!) 133/93  Pulse: 97  Resp: 17  Temp: 97.7 F (36.5 C)  SpO2: 100%   Filed Weights   12/03/17 1144  Weight: 146 lb 12.8 oz (66.6 kg)    GENERAL:alert, no distress and comfortable SKIN: skin color, texture, turgor are normal, no rashes or significant  lesions EYES: normal, Conjunctiva are pink and non-injected, sclera clear OROPHARYNX:no exudate, no erythema and lips, buccal mucosa, and tongue normal  NECK: supple, thyroid normal size, non-tender, without nodularity LYMPH:  no palpable lymphadenopathy in the cervical, axillary or inguinal LUNGS: clear to auscultation and percussion with normal breathing effort HEART: regular rate & rhythm and no murmurs and no lower extremity edema ABDOMEN:abdomen soft, non-tender and normal bowel sounds MUSCULOSKELETAL:no cyanosis of digits and no clubbing  NEURO: alert & oriented x 3 with fluent speech, no focal motor/sensory deficits EXTREMITIES: No lower extremity edema  LABORATORY DATA:  I have reviewed the data as listed CMP Latest Ref Rng & Units 11/13/2017 10/31/2017 10/24/2017  Glucose 70 - 140 mg/dL 89 96 86  BUN 7 - 26 mg/dL 6(L) 9 6(L)  Creatinine 0.60 - 1.10 mg/dL 0.71 0.74 0.80  Sodium 136 - 145 mmol/L 140 138 142  Potassium 3.5 - 5.1 mmol/L 3.6 3.5 3.5  Chloride 98 - 109 mmol/L 106 105 106  CO2 22 - 29 mmol/L _0 Calcium 8.4 - 10.4 mg/dL 9.4 9.6 9.5  Total Protein 6.4 - 8.3 g/dL 7.4 7.8 7.5  Total Bilirubin 0.2 - 1.2 mg/dL 0.5 0.3 0.4  Alkaline Phos 40 - 150 U/L 229(H) 248(H) 248(H)  AST 5 - 34 U/L _1 ALT 0 - 55 U/L _2 Lab Results  Component Value Date   WBC 10.1 12/03/2017   HGB 9.7 (L) 11/13/2017   HCT 33.0 (L) 12/03/2017   MCV 84.2 12/03/2017   PLT 314 12/03/2017   NEUTROABS 9.0 (H) 12/03/2017    ASSESSMENT & PLAN:  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 Rt mastectomy  07/28/15: Multifocal IDC 2 cm, 1.5 cm. 0.6 cm; 3/13 LN positive, T1c N1 M0 stage IIA, ER/PR HER-2 negative. 2. Adjuvant XRT 09/03/15 to 10/13/15 3. Adjuvant Xeloda X 6 months (8 cycles) started 11/15/2015 completed July 2017 4. Carboplatin and gemcitabine started 03/14/2017 5. Halaven day  1 day 8 every 3 weeks started 09/12/2017-10/31/17 6.  Palliative radiation therapy to we will were lesion as well as subcutaneous lesions in the back ---------------------------------------------------------------------------- Treatment plan: Pembrolizumab immunotherapy: Today is cycle 1 Hypokalemia: Monitoring potassium closely Patient is fully educated on immunotherapy related toxicities Goals of care: Palliation Return to clinic in 1 week for toxicity evaluation  I spent 25 minutes talking to the patient of which more than half was spent in counseling and coordination of care.  No orders of the defined types were placed in this encounter.  The patient has a good understanding of the overall plan. she agrees with it. she will call with any problems that may develop before the next visit here.   Harriette Ohara, MD 12/03/17

## 2017-12-03 NOTE — Telephone Encounter (Signed)
Gave avs and calendar for march °

## 2017-12-03 NOTE — Assessment & Plan Note (Signed)
Right breast invasive ductal carcinoma multifocal, multicentric disease with at least 8 tumors, 2.3 cm, 1.6 cm of the biggest tumors in addition 6 more tumors 1 cm or less spanning 13.7 cm, right axillary lymph node biopsy positive (ER 22%); primary tumor is ER 0%, PR 0%, HER-2 negative, Ki-67 77% and 73% respectively, grade 3 T3 N1 M0 equals stage IIIa, Grade 3 1. Rt mastectomy 07/28/15: Multifocal IDC 2 cm, 1.5 cm. 0.6 cm; 3/13 LN positive, T1c N1 M0 stage IIA, ER/PR HER-2 negative. 2. Adjuvant XRT 09/03/15 to 10/13/15 3. Adjuvant Xeloda X 6 months (8 cycles) started 11/15/2015 completed July 2017 4. Carboplatin and gemcitabine started 03/14/2017 5. Halaven day 1 day 8 every 3 weeks started 09/12/2017-10/31/17 6.  Palliative radiation therapy to we will were lesion as well as subcutaneous lesions in the back ---------------------------------------------------------------------------- Treatment plan: Pembrolizumab immunotherapy: Today is cycle 1 Hypokalemia: Monitoring potassium closely Patient is fully educated on immunotherapy related toxicities Goals of care: Palliation Return to clinic in 1 week for toxicity evaluation

## 2017-12-04 ENCOUNTER — Telehealth: Payer: Self-pay | Admitting: *Deleted

## 2017-12-04 NOTE — Telephone Encounter (Signed)
"  Call my direct line [phone].  Dr. Duanne Guess, [place] in reference to yesterday's urgent chemotherapy request for this patient.  Request received after your office hours yesterday."

## 2017-12-05 ENCOUNTER — Other Ambulatory Visit: Payer: Self-pay

## 2017-12-05 ENCOUNTER — Encounter (HOSPITAL_COMMUNITY): Payer: Self-pay | Admitting: Emergency Medicine

## 2017-12-05 ENCOUNTER — Observation Stay (HOSPITAL_COMMUNITY)
Admission: EM | Admit: 2017-12-05 | Discharge: 2017-12-07 | Disposition: A | Discharge status: Home / Self Care | Payer: 59 | Attending: Internal Medicine | Admitting: Internal Medicine

## 2017-12-05 ENCOUNTER — Emergency Department (HOSPITAL_COMMUNITY): Payer: 59

## 2017-12-05 ENCOUNTER — Telehealth: Payer: Self-pay | Admitting: *Deleted

## 2017-12-05 DIAGNOSIS — N83202 Unspecified ovarian cyst, left side: Secondary | ICD-10-CM | POA: Insufficient documentation

## 2017-12-05 DIAGNOSIS — C7951 Secondary malignant neoplasm of bone: Secondary | ICD-10-CM | POA: Diagnosis present

## 2017-12-05 DIAGNOSIS — T451X5A Adverse effect of antineoplastic and immunosuppressive drugs, initial encounter: Secondary | ICD-10-CM | POA: Diagnosis not present

## 2017-12-05 DIAGNOSIS — D573 Sickle-cell trait: Secondary | ICD-10-CM | POA: Diagnosis not present

## 2017-12-05 DIAGNOSIS — C50411 Malignant neoplasm of upper-outer quadrant of right female breast: Secondary | ICD-10-CM | POA: Diagnosis present

## 2017-12-05 DIAGNOSIS — Z85528 Personal history of other malignant neoplasm of kidney: Secondary | ICD-10-CM | POA: Insufficient documentation

## 2017-12-05 DIAGNOSIS — K224 Dyskinesia of esophagus: Principal | ICD-10-CM | POA: Diagnosis present

## 2017-12-05 DIAGNOSIS — C787 Secondary malignant neoplasm of liver and intrahepatic bile duct: Secondary | ICD-10-CM | POA: Diagnosis not present

## 2017-12-05 DIAGNOSIS — G620 Drug-induced polyneuropathy: Secondary | ICD-10-CM | POA: Insufficient documentation

## 2017-12-05 DIAGNOSIS — Z79899 Other long term (current) drug therapy: Secondary | ICD-10-CM | POA: Diagnosis not present

## 2017-12-05 DIAGNOSIS — Z905 Acquired absence of kidney: Secondary | ICD-10-CM | POA: Diagnosis not present

## 2017-12-05 DIAGNOSIS — Z7901 Long term (current) use of anticoagulants: Secondary | ICD-10-CM | POA: Insufficient documentation

## 2017-12-05 DIAGNOSIS — Z882 Allergy status to sulfonamides status: Secondary | ICD-10-CM | POA: Insufficient documentation

## 2017-12-05 DIAGNOSIS — Z9011 Acquired absence of right breast and nipple: Secondary | ICD-10-CM | POA: Insufficient documentation

## 2017-12-05 DIAGNOSIS — J90 Pleural effusion, not elsewhere classified: Secondary | ICD-10-CM | POA: Insufficient documentation

## 2017-12-05 LAB — CBC
HCT: 33.3 % — ABNORMAL LOW (ref 36.0–46.0)
HEMOGLOBIN: 11 g/dL — AB (ref 12.0–15.0)
MCH: 27.6 pg (ref 26.0–34.0)
MCHC: 33 g/dL (ref 30.0–36.0)
MCV: 83.5 fL (ref 78.0–100.0)
PLATELETS: 313 10*3/uL (ref 150–400)
RBC: 3.99 MIL/uL (ref 3.87–5.11)
RDW: 19 % — ABNORMAL HIGH (ref 11.5–15.5)
WBC: 6.7 10*3/uL (ref 4.0–10.5)

## 2017-12-05 LAB — HEPATIC FUNCTION PANEL
ALT: 29 U/L (ref 14–54)
AST: 30 U/L (ref 15–41)
Albumin: 3.1 g/dL — ABNORMAL LOW (ref 3.5–5.0)
Alkaline Phosphatase: 261 U/L — ABNORMAL HIGH (ref 38–126)
BILIRUBIN DIRECT: 0.1 mg/dL (ref 0.1–0.5)
BILIRUBIN INDIRECT: 0.6 mg/dL (ref 0.3–0.9)
BILIRUBIN TOTAL: 0.7 mg/dL (ref 0.3–1.2)
Total Protein: 6.8 g/dL (ref 6.5–8.1)

## 2017-12-05 LAB — I-STAT BETA HCG BLOOD, ED (MC, WL, AP ONLY): HCG, QUANTITATIVE: 5.4 m[IU]/mL — AB (ref <5)

## 2017-12-05 LAB — BASIC METABOLIC PANEL
ANION GAP: 12 (ref 5–15)
BUN: 11 mg/dL (ref 6–20)
CALCIUM: 9.3 mg/dL (ref 8.9–10.3)
CHLORIDE: 101 mmol/L (ref 101–111)
CO2: 24 mmol/L (ref 22–32)
CREATININE: 0.65 mg/dL (ref 0.44–1.00)
GFR calc non Af Amer: >60 mL/min (ref >60)
Glucose, Bld: 132 mg/dL — ABNORMAL HIGH (ref 65–99)
Potassium: 3.7 mmol/L (ref 3.5–5.1)
SODIUM: 137 mmol/L (ref 135–145)

## 2017-12-05 LAB — I-STAT TROPONIN, ED: TROPONIN I, POC: 0 ng/mL (ref 0.00–0.08)

## 2017-12-05 LAB — LIPASE, BLOOD: LIPASE: 17 U/L (ref 11–51)

## 2017-12-05 MED ORDER — GI COCKTAIL ~~LOC~~
30.0000 mL | Freq: Once | ORAL | Status: AC
  Administered 2017-12-05: 30 mL via ORAL
  Filled 2017-12-05: qty 30

## 2017-12-05 MED ORDER — DICYCLOMINE HCL 10 MG PO CAPS
10.0000 mg | ORAL_CAPSULE | Freq: Once | ORAL | Status: AC
  Administered 2017-12-05: 10 mg via ORAL
  Filled 2017-12-05: qty 1

## 2017-12-05 MED ORDER — FAMOTIDINE 20 MG IN NS 100 ML IVPB: 20.0000 mg | Freq: Once | INTRAVENOUS | Status: DC

## 2017-12-05 MED ORDER — FAMOTIDINE PREMIXED 20-0.9 MG/50ML-% IV SOLN
20.0000 mg | Freq: Once | INTRAVENOUS | Status: AC
  Administered 2017-12-05: 20 mg via INTRAVENOUS
  Filled 2017-12-05: qty 50

## 2017-12-05 MED ORDER — GNP PEPPERMINT SPIRIT SPRT
Freq: Once | Status: AC
  Administered 2017-12-06: 01:00:00
  Filled 2017-12-05: qty 30

## 2017-12-05 MED ORDER — DIAZEPAM 5 MG/ML IJ SOLN
2.5000 mg | Freq: Once | INTRAMUSCULAR | Status: AC
  Administered 2017-12-05: 2.5 mg via INTRAVENOUS
  Filled 2017-12-05: qty 2

## 2017-12-05 MED ORDER — SODIUM CHLORIDE 0.9 % IV BOLUS (SEPSIS)
1000.0000 mL | Freq: Once | INTRAVENOUS | Status: AC
  Administered 2017-12-05: 1000 mL via INTRAVENOUS

## 2017-12-05 MED ORDER — ALBUTEROL SULFATE (2.5 MG/3ML) 0.083% IN NEBU
5.0000 mg | INHALATION_SOLUTION | Freq: Once | RESPIRATORY_TRACT | Status: AC
  Administered 2017-12-05: 5 mg via RESPIRATORY_TRACT
  Filled 2017-12-05: qty 6

## 2017-12-05 NOTE — ED Notes (Signed)
Husband also notes that his wife had "severe pain when passing a BM today"  He explained that when she had to have a BM her pain increased.  Her husband states that the mets is in right arm (brachioplexus, sternal area and shoulder blade and 2 small spots on her spine)

## 2017-12-05 NOTE — ED Triage Notes (Signed)
Patient complaining of mid chest pain, sob, and upper abdominal pain. Patient has metastatic breast cancer and got treatment on Tuesday. Patient states this started yesterday on Thursday.

## 2017-12-05 NOTE — Telephone Encounter (Signed)
TCT patient s/p 1st Keytruda on 12/03/17. Spoke with patient and she states she does not feel well. Specifically, she states thatit hurts her upper stomach when she eats or drinks, like "the food gets in a ball and gets stuck. It hurts until it goes down lower in my stomach."  She states it happens with food-even soft food and liquids.  She started taking TUMS and prilosec last night and today-feels only a very little relief.  She also states it hurts to have a bowel movement. She states her BM's are not hard, only a little bit loose.  She states it is a cramping kind of pain-in both places (upper abdomen and lower GI tract, near her rectum.  Encouraged her to continue prilosec, even 2 x a day. If no better by Monday, Instructed her to call back Monday morning to be seen in [facility].   IF pain gets worse and she cannot tolerate the pain or can't eat or drink, instructed pt to go to the ED.  Pt voiced understanding.

## 2017-12-05 NOTE — ED Provider Notes (Signed)
[facility] DEPT Provider Note  CSN: [id number] Arrival date & time: 12/05/17 2057  Chief Complaint(s) Chest Pain; Shortness of Breath; and Abdominal Pain  HPI Phyllis Pruitt is a 47 y.o. female   The history is provided by the patient.  Chest Pain   This is a new problem. Episode onset: 48 hrs. Episode frequency: intermittent. Progression since onset: fluctuating. The pain is present in the substernal region. The quality of the pain is described as brief (aching and cramping). The pain does not radiate. Exacerbated by: swallowing. Associated symptoms include cough, malaise/fatigue and shortness of breath. Pertinent negatives include no abdominal pain, no back pain, no fever, no hemoptysis, no leg pain, no lower extremity edema, no nausea and no vomiting. Risk factors: metastatic breast cancer; just started [place].  Her past medical history is significant for cancer and DVT (on xarelto).   Patient reports that she took her home oxycodone which did not provide any relief. Reports that it is painful when she swallows anything including her saliva.  Past Medical History Past Medical History:  Diagnosis Date  . Anemia    during pregnancy  . Breast cancer of upper-outer quadrant of right female breast ([place]) 01/27/2015  . Depression    hx denies any problems now  . History of kidney cancer 12/2015   left removed "spot"  . Lymphedema    rt arm  . Neuropathy   . Sickle cell trait [facility])    Patient Active Problem List   Diagnosis Date Noted  . Bone metastases ([place]) 11/18/2017  . Liver metastases ([place]) 11/18/2017  . Port-A-Cath in place 11/13/2017  . Acquired absence of breast and nipple 06/03/2016  . Renal cell carcinoma of left kidney ([place]) 02/06/2016  . Left ovarian cyst 01/15/2016  . Renal mass 12/29/2015  . 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 Genetic testing 03/07/2015  . Left renal mass 02/17/2015  . Breast cancer of  upper-outer quadrant of right female breast ([place]) 01/27/2015   Home Medication(s) Prior to Admission medications   Medication Sig Start Date End Date Taking? Authorizing Provider  benazepril (LOTENSIN) 10 MG tablet Take 1 tablet (10 mg total) by mouth daily. 02/26/17  Yes Nicholas Lose, MD  Multiple Vitamins-Minerals (MULTIVITAMIN) tablet Take 1 tablet by mouth daily. 05/21/16  Yes Nicholas Lose, MD  omeprazole (PRILOSEC OTC) 20 MG tablet Take 20 mg by mouth daily.   Yes [provider]  oxyCODONE (OXY IR/ROXICODONE) 5 MG immediate release tablet Take 1-2 tablets (5-10 mg total) by mouth every 4 (four) hours as needed for severe pain. 11/18/17  Yes Causey, Charlestine Massed, NP  potassium chloride SA (K-DUR,KLOR-CON) 20 MEQ tablet TAKE 1 TABLET(20 MEQ) BY MOUTH DAILY 10/09/17  Yes Nicholas Lose, MD  Probiotic Product (PROBIOTIC PO) Take 1 tablet by mouth daily.   Yes [provider]  rivaroxaban (XARELTO) 20 MG TABS tablet Take 1 tablet (20 mg total) by mouth daily with supper. 09/08/17  Yes Nicholas Lose, MD  Black Pepper-Turmeric 3-500 MG CAPS Take 1 capsule by mouth daily. 05/21/16   Nicholas Lose, MD  dexamethasone (DECADRON) 4 MG tablet Take 1 tablet (4 mg total) by mouth 2 (two) times daily. Patient not taking: Reported on 12/05/2017 11/18/17   Hayden Tiffny Gemmer, PA-C  gabapentin (NEURONTIN) 300 MG capsule Take 1 capsule (300 mg total) by mouth 3 (three) times daily. Patient taking differently: Take 300 mg by mouth 2 (two) times daily.  09/12/17   Nicholas Lose, MD  lidocaine-prilocaine (EMLA) cream Apply to affected area once Patient not taking: Reported on 12/05/2017 09/19/17   Nicholas Lose, MD  lidocaine-prilocaine (EMLA) cream Apply to affected area once 11/26/17   Nicholas Lose, MD  prochlorperazine (COMPAZINE) 10 MG tablet Take 1 tablet (10 mg total) by mouth every 6 (six) hours as needed for nausea or vomiting. 11/21/17   Kyung Rudd, MD                                                                                                                                     Past Surgical History Past Surgical History:  Procedure Laterality Date  . LATISSIMUS FLAP TO BREAST Right 06/03/2016   Procedure: RIGHT LATISSIMUS FLAP TO BREAST;  Surgeon: Irene Limbo, MD;  Location: [facility];  Service: Plastics;  Laterality: Right;  . LIPOSUCTION WITH LIPOFILLING Right 09/20/2016   Procedure: LIPOSUCTION WITH LIPOFILLING;  Surgeon: Irene Limbo, MD;  Location: [facility];  Service: Plastics;  Laterality: Right;  LIPOSUCTION WITH LIPOFILLING  . MASTECTOMY    . MASTECTOMY MODIFIED RADICAL Right 07/28/2015  . MASTECTOMY MODIFIED RADICAL Right 07/28/2015   Procedure: RIGHT MODIFIED RADICAL MASTECTOMY;  Surgeon: Alphonsa Overall, MD;  Location: [facility];  Service: General;  Laterality: Right;  . PORT-A-CATH REMOVAL  07/28/2015  . PORT-A-CATH REMOVAL  07/28/2015   Procedure: REMOVAL PORT-A-CATH;  Surgeon: Alphonsa Overall, MD;  Location: [facility];  Service: General;;  . PORTACATH PLACEMENT N/A 02/10/2015   Procedure: INSERTION PORT-A-CATH WITH ULTRA SOUND, left subclavian,;  Surgeon: Alphonsa Overall, MD;  Location: [facility];  Service: General;  Laterality: N/A;  . PORTACATH PLACEMENT N/A 04/04/2017   Procedure: POWER PORT PLACEMENT;  Surgeon: Alphonsa Overall, MD;  [facility];  Service: General;  Laterality: N/A;  . REMOVAL OF TISSUE EXPANDER AND PLACEMENT OF IMPLANT Right 09/20/2016   Procedure: REMOVAL OF RIGHT TISSUE EXPANDER WITH PLACEMENT OF RIGHT SILICONE BREAST IMPLANTS LIPOFILLING FROM ABDOMEN TO RIGHT CHEST;  Surgeon: Irene Limbo, MD;  Location: [facility];  Service: Plastics;  Laterality: Right;  PLACEMENT OF RIGHT SILICONE BREAST IMPLANTS LIPOFILLING FROM ABDOMEN TO RIGHT CHEST  . ROBOTIC ASSITED PARTIAL NEPHRECTOMY Left 12/29/2015   Procedure: XI ROBOTIC ASSITED PARTIAL NEPHRECTOMY;  Surgeon: Cleon Gustin, MD;  Location[facility];  Service: Urology;   Laterality: Left;  . TISSUE EXPANDER PLACEMENT Right 06/03/2016   Procedure: PLACEMENT RIGHT TISSUE EXPANDER;  Surgeon: Irene Limbo, MD;  Location: [facility];  Service: Plastics;  Laterality: Right;  . TONSILLECTOMY    . TUBAL LIGATION    . wisdome teeth extraction     Family History History reviewed. No pertinent family history.  Social History Social History   Tobacco Use  . Smoking status: Never Smoker  . Smokeless tobacco: Never Used  Substance Use Topics  . Alcohol use: No  . Drug use: No   Allergies Sulfa antibiotics  Review of Systems Review of Systems  Constitutional: Positive for malaise/fatigue. Negative for fever.  Respiratory: Positive for cough and shortness of breath. Negative for hemoptysis.   Gastrointestinal: Negative for abdominal pain, nausea and vomiting.  Musculoskeletal: Negative for back pain.   All other systems are reviewed and are negative for acute change except as noted in the HPI  Physical Exam Vital Signs  I have reviewed the triage vital signs BP (!) 127/108   Pulse (!) 121   Temp 99.5 F (37.5 C) (Oral)   Resp 16   Ht '5\' 5"'$  (1.651 m)   Wt 67.1 kg (148 lb)   SpO2 100%   BMI 24.63 kg/m   Physical Exam  Constitutional: She is oriented to person, place, and time. She appears well-developed and well-nourished. No distress.  HENT:  Head: Normocephalic and atraumatic.  Nose: Nose normal.  Eyes: Conjunctivae and EOM are normal. Pupils are equal, round, and reactive to light. Right eye exhibits no discharge. Left eye exhibits no discharge. No scleral icterus.  Neck: Normal range of motion. Neck supple.  Cardiovascular: Regular rhythm. Tachycardia present. Exam reveals no gallop and no friction rub.  No murmur heard. Pulmonary/Chest: Effort normal and breath sounds normal. No stridor. No respiratory distress. She has no rales.  Abdominal: Soft. She exhibits no distension. There is no tenderness.  Musculoskeletal: She exhibits no edema or  tenderness.  Neurological: She is alert and oriented to person, place, and time.  Skin: Skin is warm and dry. No rash noted. She is not diaphoretic. No erythema.  Psychiatric: She has a normal mood and affect.  Vitals reviewed.   ED Results and Treatments Labs (all labs ordered are listed, but only abnormal results are displayed) Labs Reviewed  BASIC METABOLIC PANEL - Abnormal; Notable for the following components:      Result Value   Glucose, Bld 132 (*)    All other components within normal limits  CBC - Abnormal; Notable for the following components:   Hemoglobin 11.0 (*)    HCT 33.3 (*)    RDW 19.0 (*)    All other components within normal limits  HEPATIC FUNCTION PANEL - Abnormal; Notable for the following components:   Albumin 3.1 (*)    Alkaline Phosphatase 261 (*)    All other components within normal limits  I-STAT BETA HCG BLOOD, ED ([facility], [facility], [facility] ONLY) - Abnormal; Notable for the following components:   I-stat hCG, quantitative 5.4 (*)    All other components within normal limits  LIPASE, BLOOD  URINALYSIS, ROUTINE W REFLEX MICROSCOPIC  I-STAT TROPONIN, ED                                                                                                                         EKG  EKG Interpretation  Date/Time:  Friday December 05 2017 23:45:05 EST Ventricular Rate:  128 PR Interval:    QRS Duration: 77 QT Interval:  289 QTC Calculation: 422 R Axis:   50 Text Interpretation:  Sinus tachycardia  Anteroseptal infarct, age indeterminate NO STEMI Otherwise no significant change Confirmed by Addison Lank [phone]) on 12/06/2017 12:08:05 AM      Radiology Dg Chest 2 View  Result Date: 12/05/2017 CLINICAL DATA:  Chest pain cough and shortness of breath. History of breast cancer. EXAM: CHEST  2 VIEW COMPARISON:  Chest CT 11/04/2017 FINDINGS: Power injectable left chest wall Port-A-Cath follows a left subclavian approach with tip in the lower SVC. There is a medium-sized  left pleural effusion with associated atelectasis. The right lung is clear. There are right axillary surgical clips and postsurgical changes of the right breast. IMPRESSION: Medium-sized left pleural effusion with associated atelectasis, unchanged from chest CT of 11/04/2017. Electronically Signed   By: Ulyses Jarred M.D.   On: 12/05/2017 22:02   Pertinent labs & imaging results that were available during my care of the patient were reviewed by me and considered in my medical decision making (see chart for details).  Medications Ordered in ED Medications  peppermint spirit (not administered)  HYDROmorphone (DILAUDID) injection 0.5 mg (not administered)  albuterol (PROVENTIL) (2.5 MG/3ML) 0.083% nebulizer solution 5 mg (5 mg Nebulization Given 12/05/17 2109)  gi cocktail (Maalox,Lidocaine,Donnatal) (30 mLs Oral Given 12/05/17 2218)  diazepam (VALIUM) injection 2.5 mg (2.5 mg Intravenous Given 12/05/17 2242)  sodium chloride 0.9 % bolus 1,000 mL (0 mLs Intravenous Stopped 12/05/17 2345)  dicyclomine (BENTYL) capsule 10 mg (10 mg Oral Given 12/05/17 2348)  famotidine (PEPCID) IVPB 20 mg (20 mg Intravenous Given 12/05/17 2345)                                                                                                                                    Procedures Procedures  (including critical care time)  Medical Decision Making / ED Course I have reviewed the nursing notes for this encounter and the patient's prior records (if available in EHR or on provided paperwork).    Her presentation is most suspicious for esophageal spasms.  It is highly atypical for ACS.  EKG revealed sinus tachycardia without acute ischemic changes.  Initial troponin negative.  Low suspicion for pulmonary embolism.  Not classic for aortic dissection or esophageal perforation.  Chest x-ray with stable left pleural effusion but  without evidence suggestive of pneumonia, pneumothorax, pneumomediastinum.  No abnormal contour  of the mediastinum to suggest dissection. No evidence of acute injuries.  Labs grossly reassuring without leukocytosis or significant electrolyte derangements.  No evidence of biliary obstruction.  Alk phos is elevated but likely secondary to bony metastases.  No evidence of pancreatitis.  Abdomen is benign.  Low suspicion for serious intra-abdominal inflammatory/infectious process requiring imaging at this time.  With patient was provided with GI cocktail and Valium that provided relief for approximately 15 minutes.  Patient given IV fluids.  Will attempt Bentyl, pepcid, and peppermint oil.  Patient reported improvement in the frequency however when p.o. challenged her symptoms recurred.  Will discuss  case with medicine for admission given her inability to tolerate oral intake due to symptomatic esophageal spasms in the setting of an immunosuppressed patient on chemotherapy.  Discussed with Dr. Alcario Drought, who recommended Diltiazem. If her symptoms do not improve, he will admit. If she improves she will be discharged.   Final Clinical Impression(s) / ED Diagnoses Final diagnoses:  Esophageal spasm      This chart was dictated using voice recognition software.  Despite best efforts to proofread,  errors can occur which can change the documentation meaning.   Fatima Blank, MD 12/06/17 [phone]

## 2017-12-05 NOTE — ED Notes (Signed)
Patient transported to X-ray 

## 2017-12-05 NOTE — ED Notes (Signed)
Pt is here for difficulty breathing and a feeling of "really bad heartburn" in epigastric area.  Pt states that "I could not breathe and had to gasp for air"  These symptoms began yesterday and became worse today.  Pt states that she thinks this is a side effect of taking [place].  She took her first dose of keytruda on Wednesday after completing her last radiation treatment on Tuesday for metastatic breastcancer.  Pt is in obvious discomfort, pt appears to have painful spasms in her epigastric area.

## 2017-12-05 NOTE — Telephone Encounter (Signed)
-----   Message from Herschell Dimes, RN sent at 12/03/2017  4:25 PM EST ----- Regarding: Dr. Geralyn Flash patient - first [place]: [phone] Dr. Geralyn Flash patient Phyllis Pruitt tolerated her first [place] well today. [phone]

## 2017-12-05 NOTE — ED Notes (Signed)
Spoke with Ronny Bacon in lab, stated she would add on the hepatic function panel to the light green tube in lab. RN notified.

## 2017-12-06 DIAGNOSIS — K224 Dyskinesia of esophagus: Secondary | ICD-10-CM | POA: Diagnosis not present

## 2017-12-06 DIAGNOSIS — Z171 Estrogen receptor negative status [ER-]: Secondary | ICD-10-CM | POA: Diagnosis not present

## 2017-12-06 DIAGNOSIS — C50411 Malignant neoplasm of upper-outer quadrant of right female breast: Secondary | ICD-10-CM

## 2017-12-06 DIAGNOSIS — C7951 Secondary malignant neoplasm of bone: Secondary | ICD-10-CM | POA: Diagnosis not present

## 2017-12-06 DIAGNOSIS — C787 Secondary malignant neoplasm of liver and intrahepatic bile duct: Secondary | ICD-10-CM

## 2017-12-06 LAB — URINALYSIS, ROUTINE W REFLEX MICROSCOPIC
BILIRUBIN URINE: NEGATIVE
Bacteria, UA: NONE SEEN
GLUCOSE, UA: NEGATIVE mg/dL
Hgb urine dipstick: NEGATIVE
KETONES UR: NEGATIVE mg/dL
LEUKOCYTES UA: NEGATIVE
NITRITE: NEGATIVE
PH: 7 (ref 5.0–8.0)
Protein, ur: 30 mg/dL — AB
SPECIFIC GRAVITY, URINE: 1.012 (ref 1.005–1.030)

## 2017-12-06 LAB — HIV ANTIBODY (ROUTINE TESTING W REFLEX): HIV Screen 4th Generation wRfx: NONREACTIVE

## 2017-12-06 MED ORDER — ORAL CARE MOUTH RINSE
15.0000 mL | Freq: Two times a day (BID) | OROMUCOSAL | Status: DC
  Administered 2017-12-06: 15 mL via OROMUCOSAL

## 2017-12-06 MED ORDER — ACETAMINOPHEN 650 MG RE SUPP: 650.0000 mg | Freq: Four times a day (QID) | RECTAL | Status: DC | PRN

## 2017-12-06 MED ORDER — SODIUM CHLORIDE 0.9 % IV SOLN
INTRAVENOUS | Status: DC
  Administered 2017-12-06 (×4): via INTRAVENOUS

## 2017-12-06 MED ORDER — ONDANSETRON HCL 4 MG/2ML IJ SOLN: 4.0000 mg | Freq: Four times a day (QID) | INTRAMUSCULAR | Status: DC | PRN

## 2017-12-06 MED ORDER — GABAPENTIN 300 MG PO CAPS: 300.0000 mg | ORAL_CAPSULE | Freq: Two times a day (BID) | ORAL | Status: DC

## 2017-12-06 MED ORDER — OXYCODONE HCL 5 MG PO TABS
5.0000 mg | ORAL_TABLET | ORAL | Status: DC | PRN
Start: 2017-12-06 — End: 2017-12-07
  Filled 2017-12-06: qty 1

## 2017-12-06 MED ORDER — GABAPENTIN 300 MG PO CAPS
300.0000 mg | ORAL_CAPSULE | Freq: Two times a day (BID) | ORAL | Status: DC
  Administered 2017-12-06: 300 mg via ORAL
  Filled 2017-12-06: qty 1

## 2017-12-06 MED ORDER — ONDANSETRON HCL 4 MG PO TABS: 4.0000 mg | ORAL_TABLET | Freq: Four times a day (QID) | ORAL | Status: DC | PRN

## 2017-12-06 MED ORDER — DILTIAZEM HCL 60 MG PO TABS
60.0000 mg | ORAL_TABLET | Freq: Once | ORAL | Status: AC
  Administered 2017-12-06: 60 mg via ORAL
  Filled 2017-12-06: qty 1

## 2017-12-06 MED ORDER — PANTOPRAZOLE SODIUM 40 MG PO TBEC
40.0000 mg | DELAYED_RELEASE_TABLET | Freq: Every day | ORAL | Status: DC
  Filled 2017-12-06: qty 1

## 2017-12-06 MED ORDER — MORPHINE SULFATE (PF) 2 MG/ML IV SOLN
2.0000 mg | INTRAVENOUS | Status: DC | PRN
  Administered 2017-12-06 (×3): 2 mg via INTRAVENOUS
  Filled 2017-12-06 (×3): qty 1

## 2017-12-06 MED ORDER — RIVAROXABAN 20 MG PO TABS
20.0000 mg | ORAL_TABLET | Freq: Every day | ORAL | Status: DC
  Administered 2017-12-06: 20 mg via ORAL
  Filled 2017-12-06 (×2): qty 1

## 2017-12-06 MED ORDER — BENAZEPRIL HCL 10 MG PO TABS
10.0000 mg | ORAL_TABLET | Freq: Every day | ORAL | Status: DC
  Filled 2017-12-06: qty 1

## 2017-12-06 MED ORDER — CHLORHEXIDINE GLUCONATE 0.12 % MT SOLN
15.0000 mL | Freq: Two times a day (BID) | OROMUCOSAL | Status: DC
  Administered 2017-12-06: 15 mL via OROMUCOSAL
  Filled 2017-12-06: qty 15

## 2017-12-06 MED ORDER — HYDROMORPHONE HCL 1 MG/ML IJ SOLN
0.5000 mg | Freq: Once | INTRAMUSCULAR | Status: AC
  Administered 2017-12-06: 0.5 mg via INTRAVENOUS
  Filled 2017-12-06: qty 1

## 2017-12-06 MED ORDER — BLACK PEPPER-TURMERIC 3-500 MG PO CAPS: 1.0000 | ORAL_CAPSULE | Freq: Every day | ORAL | Status: DC

## 2017-12-06 MED ORDER — PROCHLORPERAZINE MALEATE 10 MG PO TABS
10.0000 mg | ORAL_TABLET | Freq: Four times a day (QID) | ORAL | Status: DC | PRN
  Filled 2017-12-06: qty 1

## 2017-12-06 MED ORDER — POTASSIUM CHLORIDE CRYS ER 20 MEQ PO TBCR
20.0000 meq | EXTENDED_RELEASE_TABLET | Freq: Every day | ORAL | Status: DC
Start: 2017-12-06 — End: 2017-12-07
  Filled 2017-12-06: qty 1

## 2017-12-06 MED ORDER — ADULT MULTIVITAMIN W/MINERALS CH
1.0000 | ORAL_TABLET | Freq: Every day | ORAL | Status: DC
  Filled 2017-12-06: qty 1

## 2017-12-06 MED ORDER — ACETAMINOPHEN 325 MG PO TABS: 650.0000 mg | ORAL_TABLET | Freq: Four times a day (QID) | ORAL | Status: DC | PRN

## 2017-12-06 NOTE — Progress Notes (Signed)
[facility]                                                                              Patient Demographics  Phyllis Pruitt, is a 47 y.o. female, DOB - [birth year], [place] date - 12/05/2017   Admitting Physician No admitting provider for patient encounter.  Outpatient Primary MD for the patient is [name], MD  Outpatient specialists:   LOS - 0  days    Chief Complaint  Patient presents with  . Chest Pain  . Shortness of Breath  . Abdominal Pain       Brief summary   Phyllis Pruitt is a 47 y.o. female with medical history significant of BRCA with mets to liver and bone, on chemotherapy, presenting with chest pain with swallowing.  Patient admitted for suspected esophageal spasms on account of failure of improvement of symptoms at their ED with Pepcid, Valium, Bentyl, Dilaudid, GI cocktail, omeprazole and peppermint spirit.    Assessment & Plan    Principal Problem:   Esophageal spasm Active Problems:   Breast cancer of upper-outer quadrant of right female breast (HCC)   Bone metastases (HCC)   Liver metastases (HCC)  Chest pain: Atypical GI etiology Continue PPI Continue supportive care Consider GI consult if not better or persistent  Code Status: Full code DVT Prophylaxis:  On XARELTO Family Communication: Discussed in detail with the patient, all imaging results, lab results explained to the patient   Disposition Plan: Home  Time Spent in minutes   35 minutes  Procedures:    Consultants:    Antimicrobials:      Medications  Scheduled Meds: . benazepril  10 mg Oral Daily  . gabapentin  300 mg Oral BID  . multivitamin with minerals  1 tablet Oral Daily  . pantoprazole  40 mg Oral Daily  . potassium chloride SA  20 mEq Oral Daily  . rivaroxaban  20 mg Oral Q supper   Continuous Infusions: . sodium chloride 125 mL/hr at 12/06/17 1138   PRN Meds:.acetaminophen **OR** acetaminophen, morphine injection,  ondansetron **OR** ondansetron (ZOFRAN) IV, oxyCODONE, prochlorperazine   Antibiotics   Anti-infectives (From admission, onward)   None        Subjective:   Phyllis Pruitt was seen and examined today. Clinical database reviewed and noted. Chest pains  better but persists Vitals:   12/06/17 0700 12/06/17 0730 12/06/17 1125 12/06/17 1359  BP: 114/89 117/85 125/89 (!) 128/93  Pulse: (!) 110  (!) 120 (!) 113  Resp: 15 (!) 22 (!) 26 14  Temp:      TempSrc:      SpO2: 98%  93% 99%  Weight:      Height:        Intake/Output Summary (Last 24 hours) at 12/06/2017 1411 Last data filed at 12/05/2017 2345 Gross per 24 hour  Intake 1000 ml  Output -  Net 1000 ml     Wt Readings from Last 3 Encounters:  12/05/17 67.1 kg (148 lb)  12/03/17 66.6 kg (146 lb 12.8 oz)  11/26/17 68.3 kg (150 lb 9.6 oz)     Exam  General: NAD  HEENT: NCAT,  PERRL,MMM  Neck: SUPPLE, (-) JVD  Cardiovascular: RRR, (-) GALLOP, (-) MURMUR  Respiratory: CTA  Gastrointestinal: SOFT, (-) DISTENSION, BS(+), (_) TENDERNESS  Ext: (-) CYANOSIS, (-) EDEMA  Neuro: A, OX 3  Skin:(-) RASH  Psych:NORMAL AFFECT/MOOD   Data Reviewed:  I have personally reviewed following labs and imaging studies  Micro Results No results found for this or any previous visit (from the past 240 hour(s)).  Radiology Reports Dg Chest 2 View  Result Date: 12/05/2017 CLINICAL DATA:  Chest pain cough and shortness of breath. History of breast cancer. EXAM: CHEST  2 VIEW COMPARISON:  Chest CT 11/04/2017 FINDINGS: Power injectable left chest wall Port-A-Cath follows a left subclavian approach with tip in the lower SVC. There is a medium-sized left pleural effusion with associated atelectasis. The right lung is clear. There are right axillary surgical clips and postsurgical changes of the right breast. IMPRESSION: Medium-sized left pleural effusion with associated atelectasis, unchanged from chest CT of 11/04/2017.  Electronically Signed   By: Ulyses Jarred M.D.   On: 12/05/2017 22:02   Mr Thoracic Spine W Wo Contrast  Result Date: 11/18/2017 CLINICAL DATA:  Back pain.  Metastatic breast cancer. EXAM: MRI THORACIC AND LUMBAR SPINE WITHOUT AND WITH CONTRAST TECHNIQUE: Multiplanar and multiecho pulse sequences of the thoracic and lumbar spine were obtained without and with intravenous contrast. CONTRAST:  77m MULTIHANCE GADOBENATE DIMEGLUMINE 529 MG/ML IV SOLN COMPARISON:  CT scans dated 11/04/2017 and PET-CT dated 07/28/2017 FINDINGS: MRI THORACIC SPINE FINDINGS Alignment:  Physiologic. Vertebrae: There is metastatic disease involving the entire thoracic spine with the most extensive metastases being and T6 through T9. At those levels there is more extensive involvement of the vertebral bodies and posterior elements than at other levels. However, there is no discrete pathologic fracture. After contrast administration there is epidural enhancement posteriorly at T5 through T8 without mass effect upon the thecal sac. There is no spinal cord compression. Cord: No mass lesion or myelopathy or spinal cord compression. Paraspinal and other soft tissues: Tiny right pleural effusion. Moderate left effusion, increased since the prior CT scan of 11/04/2017. Radiation fibrosis in the upper lobe of the right lung. Disc levels: The discs throughout the thoracic spine appear normal. No spinal or foraminal stenosis. MRI LUMBAR SPINE FINDINGS Segmentation:  Standard. Alignment:  Physiologic. Vertebrae: Metastatic disease throughout the lumbar spine. No pathologic fractures. Enhancing tumor involves the second and third sacral segments with a tumor mass extending into the posterior aspect of the distal sacral canal at S3 and S4 asymmetric to the left. Conus medullaris: Extends to the L1-2 level and appears normal. Paraspinal and other soft tissues: Negative. Disc levels: L1-2 and L2-3: Normal discs. Metastatic disease in the vertebra. No  extension of tumor into the spinal canal. L3-4: Tiny broad-based disc bulge. Metastatic disease in the vertebral body and posterior elements. Slight facet arthritis bilaterally. L4-5: Tiny broad-based disc bulge without neural impingement. Metastatic disease infiltrates the left superior facet of L5. No extension into the spinal canal. L5-S1 focal disc protrusion paracentral and to the left with a slight mass effect upon the left S1 nerve. No tumor extension into the spinal canal. Enhancing tumor in the spinous process of L5. Sacrum: Soft tissue mass involving the posterior aspect of the S3 segment of the sacrum extending into the left side of the sacral canal. IMPRESSION: 1. Diffuse metastatic disease involving the entire thoracic and lumbar spine without pathologic fractures. 2. Soft tissue mass in the  posterior aspect of the S3 and S4 segments of the sacrum does extend into the left side of the sacral spinal canal. 3. Small focal disc protrusion at L5-S1 to the left with a slight mass effect upon the left S1 nerve root sleeve. 4. Tumor is most extensive in the midthoracic spine particularly involving the posterior elements from T5-T8. Abnormal epidural enhancement posteriorly at those levels probably represents tumor in the spinal canal. However, there is no mass effect upon the thecal sac or spinal cord. Electronically Signed   By: Lorriane Shire M.D.   On: 11/18/2017 12:44   Mr Lumbar Spine W Wo Contrast  Result Date: 11/18/2017 CLINICAL DATA:  Back pain.  Metastatic breast cancer. EXAM: MRI THORACIC AND LUMBAR SPINE WITHOUT AND WITH CONTRAST TECHNIQUE: Multiplanar and multiecho pulse sequences of the thoracic and lumbar spine were obtained without and with intravenous contrast. CONTRAST:  70m MULTIHANCE GADOBENATE DIMEGLUMINE 529 MG/ML IV SOLN COMPARISON:  CT scans dated 11/04/2017 and PET-CT dated 07/28/2017 FINDINGS: MRI THORACIC SPINE FINDINGS Alignment:  Physiologic. Vertebrae: There is metastatic  disease involving the entire thoracic spine with the most extensive metastases being and T6 through T9. At those levels there is more extensive involvement of the vertebral bodies and posterior elements than at other levels. However, there is no discrete pathologic fracture. After contrast administration there is epidural enhancement posteriorly at T5 through T8 without mass effect upon the thecal sac. There is no spinal cord compression. Cord: No mass lesion or myelopathy or spinal cord compression. Paraspinal and other soft tissues: Tiny right pleural effusion. Moderate left effusion, increased since the prior CT scan of 11/04/2017. Radiation fibrosis in the upper lobe of the right lung. Disc levels: The discs throughout the thoracic spine appear normal. No spinal or foraminal stenosis. MRI LUMBAR SPINE FINDINGS Segmentation:  Standard. Alignment:  Physiologic. Vertebrae: Metastatic disease throughout the lumbar spine. No pathologic fractures. Enhancing tumor involves the second and third sacral segments with a tumor mass extending into the posterior aspect of the distal sacral canal at S3 and S4 asymmetric to the left. Conus medullaris: Extends to the L1-2 level and appears normal. Paraspinal and other soft tissues: Negative. Disc levels: L1-2 and L2-3: Normal discs. Metastatic disease in the vertebra. No extension of tumor into the spinal canal. L3-4: Tiny broad-based disc bulge. Metastatic disease in the vertebral body and posterior elements. Slight facet arthritis bilaterally. L4-5: Tiny broad-based disc bulge without neural impingement. Metastatic disease infiltrates the left superior facet of L5. No extension into the spinal canal. L5-S1 focal disc protrusion paracentral and to the left with a slight mass effect upon the left S1 nerve. No tumor extension into the spinal canal. Enhancing tumor in the spinous process of L5. Sacrum: Soft tissue mass involving the posterior aspect of the S3 segment of the sacrum  extending into the left side of the sacral canal. IMPRESSION: 1. Diffuse metastatic disease involving the entire thoracic and lumbar spine without pathologic fractures. 2. Soft tissue mass in the posterior aspect of the S3 and S4 segments of the sacrum does extend into the left side of the sacral spinal canal. 3. Small focal disc protrusion at L5-S1 to the left with a slight mass effect upon the left S1 nerve root sleeve. 4. Tumor is most extensive in the midthoracic spine particularly involving the posterior elements from T5-T8. Abnormal epidural enhancement posteriorly at those levels probably represents tumor in the spinal canal. However, there is no mass effect upon the thecal sac or spinal  cord. Electronically Signed   By: Lorriane Shire M.D.   On: 11/18/2017 12:44    Lab Data:  CBC: Recent Labs  Lab 12/03/17 1116 12/05/17 2117  WBC 10.1 6.7  NEUTROABS 9.0*  --   HGB  --  11.0*  HCT 33.0* 33.3*  MCV 84.2 83.5  PLT 314 875   Basic Metabolic Panel: Recent Labs  Lab 12/03/17 1116 12/05/17 2117  NA 142 137  K 3.8 3.7  CL 104 101  CO2 26 24  GLUCOSE 94 132*  BUN 16 11  CREATININE 0.74 0.65  CALCIUM 9.5 9.3   GFR: Estimated Creatinine Clearance: 78.2 mL/min (by C-G formula based on SCr of 0.65 mg/dL). Liver Function Tests: Recent Labs  Lab 12/03/17 1116 12/05/17 2117  AST 33 30  ALT 38 29  ALKPHOS 309* 261*  BILITOT 0.3 0.7  PROT 7.7 6.8  ALBUMIN 3.0* 3.1*   Recent Labs  Lab 12/05/17 2117  LIPASE 17   No results for input(s): AMMONIA in the last 168 hours. Coagulation Profile: No results for input(s): INR, PROTIME in the last 168 hours. Cardiac Enzymes: No results for input(s): CKTOTAL, CKMB, CKMBINDEX, TROPONINI in the last 168 hours. BNP (last 3 results) No results for input(s): PROBNP in the last 8760 hours. HbA1C: No results for input(s): HGBA1C in the last 72 hours. CBG: No results for input(s): GLUCAP in the last 168 hours. Lipid Profile: No results  for input(s): CHOL, HDL, LDLCALC, TRIG, CHOLHDL, LDLDIRECT in the last 72 hours. Thyroid Function Tests: No results for input(s): TSH, T4TOTAL, FREET4, T3FREE, THYROIDAB in the last 72 hours. Anemia Panel: No results for input(s): VITAMINB12, FOLATE, FERRITIN, TIBC, IRON, RETICCTPCT in the last 72 hours. Urine analysis:    Component Value Date/Time   COLORURINE YELLOW 12/05/2017 2211   APPEARANCEUR CLEAR 12/05/2017 2211   LABSPEC 1.012 12/05/2017 2211   PHURINE 7.0 12/05/2017 2211   GLUCOSEU NEGATIVE 12/05/2017 2211   HGBUR NEGATIVE 12/05/2017 2211   BILIRUBINUR NEGATIVE 12/05/2017 2211   KETONESUR NEGATIVE 12/05/2017 2211   PROTEINUR 30 (A) 12/05/2017 2211   NITRITE NEGATIVE 12/05/2017 2211   LEUKOCYTESUR NEGATIVE 12/05/2017 2211     OSEI-BONSU,Shironda Kain M.D. [facility] 12/06/2017, 2:11 PM  Pager: [phone] Between 7am to 7pm - call Pager - [phone]  After 7pm go to [email or web address] - password [email or web address]  Call night coverage person covering after 7pm

## 2017-12-06 NOTE — H&P (Signed)
History and Physical    Phyllis Pruitt MGQ:[id number] DOB: Sep 09, 1971 DOA: 12/05/2017  PCP: [name], MD  Patient coming from: Home  I have personally briefly reviewed patient's old medical records in [place]  Chief Complaint: Pain with swallowing  HPI: Phyllis Pruitt is a 47 y.o. female with medical history significant of BRCA with mets to liver and bone.  Patient on chemotherapy, just started letuda.  She presents to the ED with episodic, severe, chest pain.  Pain located in central region of chest.  Becomes severe when ever she tries to swallow anything.  Pain is brief, aching and cramping.  No radiation.  No N/V.  Home oxy provides no relief.   ED Course: Suspicious for esophageal spasms, treatments in ED tried include: pepcid, valium, bentyl, dilaudid, GI cocktail, omeprazole, and peppermint spirit.  Patient has some improvement, but still is essentially unable to take POs, extreme pain with even a sip of water.   Review of Systems: As per HPI otherwise 10 point review of systems negative.   Past Medical History:  Diagnosis Date  . Anemia    during pregnancy  . Breast cancer of upper-outer quadrant of right female breast ([place]) 01/27/2015  . Depression    hx denies any problems now  . History of kidney cancer 12/2015   left removed "spot"  . Lymphedema    rt arm  . Neuropathy   . Sickle cell trait [facility])     Past Surgical History:  Procedure Laterality Date  . LATISSIMUS FLAP TO BREAST Right 06/03/2016   Procedure: RIGHT LATISSIMUS FLAP TO BREAST;  Surgeon: Irene Limbo, MD;  Location: [place];  Service: Plastics;  Laterality: Right;  . LIPOSUCTION WITH LIPOFILLING Right 09/20/2016   Procedure: LIPOSUCTION WITH LIPOFILLING;  Surgeon: Irene Limbo, MD;  Location: [place];  Service: Plastics;  Laterality: Right;  LIPOSUCTION WITH LIPOFILLING  . MASTECTOMY    . MASTECTOMY MODIFIED RADICAL Right 07/28/2015  . MASTECTOMY MODIFIED RADICAL  Right 07/28/2015   Procedure: RIGHT MODIFIED RADICAL MASTECTOMY;  Surgeon: Alphonsa Overall, MD;  Location: [place];  Service: General;  Laterality: Right;  . PORT-A-CATH REMOVAL  07/28/2015  . PORT-A-CATH REMOVAL  07/28/2015   Procedure: REMOVAL PORT-A-CATH;  Surgeon: Alphonsa Overall, MD;  Location: [place];  Service: General;;  . PORTACATH PLACEMENT N/A 02/10/2015   Procedure: INSERTION PORT-A-CATH WITH ULTRA SOUND, left subclavian,;  Surgeon: Alphonsa Overall, MD;  Location[facility];  Service: General;  Laterality: N/A;  . PORTACATH PLACEMENT N/A 04/04/2017   Procedure: POWER PORT PLACEMENT;  Surgeon: Alphonsa Overall, MD;  Location: [facility];  Service: General;  Laterality: N/A;  . REMOVAL OF TISSUE EXPANDER AND PLACEMENT OF IMPLANT Right 09/20/2016   Procedure: REMOVAL OF RIGHT TISSUE EXPANDER WITH PLACEMENT OF RIGHT SILICONE BREAST IMPLANTS LIPOFILLING FROM ABDOMEN TO RIGHT CHEST;  Surgeon: Irene Limbo, MD;  Location: [place];  Service: Plastics;  Laterality: Right;  PLACEMENT OF RIGHT SILICONE BREAST IMPLANTS LIPOFILLING FROM ABDOMEN TO RIGHT CHEST  . ROBOTIC ASSITED PARTIAL NEPHRECTOMY Left 12/29/2015   Procedure: XI ROBOTIC ASSITED PARTIAL NEPHRECTOMY;  Surgeon: Cleon Gustin, MD;  Location: [facility];  Service: Urology;  Laterality: Left;  . TISSUE EXPANDER PLACEMENT Right 06/03/2016   Procedure: PLACEMENT RIGHT TISSUE EXPANDER;  Surgeon: Irene Limbo, MD;  Location: [place];  Service: Plastics;  Laterality: Right;  . TONSILLECTOMY    . TUBAL LIGATION    . [place] teeth extraction  reports that  has never smoked. she has never used smokeless tobacco. She reports that she does not drink alcohol or use drugs.  Allergies  Allergen Reactions  . Sulfa Antibiotics Itching    Vaginal bleeding    History reviewed. No pertinent family history.   Prior to Admission medications   Medication Sig Start Date End Date Taking? Authorizing Provider  benazepril (LOTENSIN) 10 MG tablet  Take 1 tablet (10 mg total) by mouth daily. 02/26/17  Yes Nicholas Lose, MD  Multiple Vitamins-Minerals (MULTIVITAMIN) tablet Take 1 tablet by mouth daily. 05/21/16  Yes Nicholas Lose, MD  omeprazole (PRILOSEC OTC) 20 MG tablet Take 20 mg by mouth daily.   Yes [provider]  oxyCODONE (OXY IR/ROXICODONE) 5 MG immediate release tablet Take 1-2 tablets (5-10 mg total) by mouth every 4 (four) hours as needed for severe pain. 11/18/17  Yes Causey, Charlestine Massed, NP  potassium chloride SA (K-DUR,KLOR-CON) 20 MEQ tablet TAKE 1 TABLET(20 MEQ) BY MOUTH DAILY 10/09/17  Yes Nicholas Lose, MD  Probiotic Product (PROBIOTIC PO) Take 1 tablet by mouth daily.   Yes [provider]  rivaroxaban (XARELTO) 20 MG TABS tablet Take 1 tablet (20 mg total) by mouth daily with supper. 09/08/17  Yes Nicholas Lose, MD  Black Pepper-Turmeric 3-500 MG CAPS Take 1 capsule by mouth daily. 05/21/16   Nicholas Lose, MD  gabapentin (NEURONTIN) 300 MG capsule Take 1 capsule (300 mg total) by mouth 3 (three) times daily. Patient taking differently: Take 300 mg by mouth 2 (two) times daily.  09/12/17   Nicholas Lose, MD  lidocaine-prilocaine (EMLA) cream Apply to affected area once 11/26/17   Nicholas Lose, MD  prochlorperazine (COMPAZINE) 10 MG tablet Take 1 tablet (10 mg total) by mouth every 6 (six) hours as needed for nausea or vomiting. 11/21/17   Kyung Rudd, MD    Physical Exam: Vitals:   12/06/17 0030 12/06/17 0100 12/06/17 0115 12/06/17 0130  BP: (!) 132/99 118/88  114/90  Pulse: (!) 139 (!) 117 (!) 120 (!) 123  Resp: 15 (!) '21 14 13  '$ Temp:      TempSrc:      SpO2: 100% 95% 97% 97%  Weight:      Height:        Constitutional: NAD, calm, comfortable Eyes: PERRL, lids and conjunctivae normal ENMT: Mucous membranes are moist. Posterior pharynx clear of any exudate or lesions.Normal dentition.  Neck: normal, supple, no masses, no thyromegaly Respiratory: clear to auscultation bilaterally, no wheezing,  no crackles. Normal respiratory effort. No accessory muscle use.  Cardiovascular: Regular rate and rhythm, no murmurs / rubs / gallops. No extremity edema. 2+ pedal pulses. No carotid bruits.  Abdomen: no tenderness, no masses palpated. No hepatosplenomegaly. Bowel sounds positive.  Musculoskeletal: no clubbing / cyanosis. No joint deformity upper and lower extremities. Good ROM, no contractures. Normal muscle tone.  Skin: no rashes, lesions, ulcers. No induration Neurologic: CN 2-12 grossly intact. Sensation intact, DTR normal. Strength 5/5 in all 4.  Psychiatric: Normal judgment and insight. Alert and oriented x 3. Normal mood.    Labs on Admission: I have personally reviewed following labs and imaging studies  CBC: Recent Labs  Lab 12/03/17 1116 12/05/17 2117  WBC 10.1 6.7  NEUTROABS 9.0*  --   HGB  --  11.0*  HCT 33.0* 33.3*  MCV 84.2 83.5  PLT 314 536   Basic Metabolic Panel: Recent Labs  Lab 12/03/17 1116 12/05/17 2117  NA 142 137  K 3.8  3.7  CL 104 101  CO2 26 24  GLUCOSE 94 132*  BUN 16 11  CREATININE 0.74 0.65  CALCIUM 9.5 9.3   GFR: Estimated Creatinine Clearance: 78.2 mL/min (by C-G formula based on SCr of 0.65 mg/dL). Liver Function Tests: Recent Labs  Lab 12/03/17 1116 12/05/17 2117  AST 33 30  ALT 38 29  ALKPHOS 309* 261*  BILITOT 0.3 0.7  PROT 7.7 6.8  ALBUMIN 3.0* 3.1*   Recent Labs  Lab 12/05/17 2117  LIPASE 17   No results for input(s): AMMONIA in the last 168 hours. Coagulation Profile: No results for input(s): INR, PROTIME in the last 168 hours. Cardiac Enzymes: No results for input(s): CKTOTAL, CKMB, CKMBINDEX, TROPONINI in the last 168 hours. BNP (last 3 results) No results for input(s): PROBNP in the last 8760 hours. HbA1C: No results for input(s): HGBA1C in the last 72 hours. CBG: No results for input(s): GLUCAP in the last 168 hours. Lipid Profile: No results for input(s): CHOL, HDL, LDLCALC, TRIG, CHOLHDL, LDLDIRECT in the  last 72 hours. Thyroid Function Tests: No results for input(s): TSH, T4TOTAL, FREET4, T3FREE, THYROIDAB in the last 72 hours. Anemia Panel: No results for input(s): VITAMINB12, FOLATE, FERRITIN, TIBC, IRON, RETICCTPCT in the last 72 hours. Urine analysis:    Component Value Date/Time   COLORURINE YELLOW 12/05/2017 2211   APPEARANCEUR CLEAR 12/05/2017 2211   LABSPEC 1.012 12/05/2017 2211   PHURINE 7.0 12/05/2017 2211   GLUCOSEU NEGATIVE 12/05/2017 2211   HGBUR NEGATIVE 12/05/2017 2211   BILIRUBINUR NEGATIVE 12/05/2017 2211   KETONESUR NEGATIVE 12/05/2017 2211   PROTEINUR 30 (A) 12/05/2017 2211   NITRITE NEGATIVE 12/05/2017 2211   LEUKOCYTESUR NEGATIVE 12/05/2017 2211    Radiological Exams on Admission: Dg Chest 2 View  Result Date: 12/05/2017 CLINICAL DATA:  Chest pain cough and shortness of breath. History of breast cancer. EXAM: CHEST  2 VIEW COMPARISON:  Chest CT 11/04/2017 FINDINGS: Power injectable left chest wall Port-A-Cath follows a left subclavian approach with tip in the lower SVC. There is a medium-sized left pleural effusion with associated atelectasis. The right lung is clear. There are right axillary surgical clips and postsurgical changes of the right breast. IMPRESSION: Medium-sized left pleural effusion with associated atelectasis, unchanged from chest CT of 11/04/2017. Electronically Signed   By: Ulyses Jarred M.D.   On: 12/05/2017 22:02    EKG: Independently reviewed.  Assessment/Plan Principal Problem:   Esophageal spasm Active Problems:   Breast cancer of upper-outer quadrant of right female breast ([place])   Bone metastases ([place])   Liver metastases ([place])    1. Esophageal spasm - unable to take POs 1. See HPI for list of treatments tried thus far. 2. Also tried one time dose of cardizem 3. IVF: NS at 125 cc/hr 4. Continue PPI 5. GI consult in AM 2. BRCA - left message in Epic for oncology  DVT prophylaxis: Xarelto Code Status: Full Family  Communication: Husband at bedside Disposition Plan: Home after admit Consults called: None called, left consult in epic for oncology Admission status: Admit to inpatient   [place], [facility] Pager [phone]  If 7AM-7PM, please contact day team taking care of patient [email or web address] Password [facility]  12/06/2017, 2:11 AM

## 2017-12-06 NOTE — ED Notes (Signed)
ED TO INPATIENT HANDOFF REPORT  Name/Age/Gender Phyllis Pruitt 47 y.o. female  Code Status    Code Status Orders  (From admission, onward)        Start     Ordered   12/06/17 0208  Full code  Continuous     12/06/17 0208    Code Status History    Date Active Date Inactive Code Status Order ID Comments User Context   06/03/2016 13:36 06/04/2016 17:13 Full Code [id number]  Irene Limbo, MD Inpatient   07/28/2015 15:20 07/29/2015 14:19 Full Code [id number]  Alphonsa Overall, MD Inpatient      Home/SNF/Other Home  Chief Complaint chest pain sob ( chemo card )  Level of Care/Admitting Diagnosis ED Disposition    ED Disposition Condition [place]: [facility] [[id number]]  Level of Care: Med-Surg [16]  Diagnosis: Esophageal spasm [341937]  Admitting Physician: Etta Quill [[id number]]  Attending Physician: Etta Quill [[id number]]  PT Class (Do Not Modify): Observation [104]  PT Acc Code (Do Not Modify): Observation [10022]       Medical History Past Medical History:  Diagnosis Date  . Anemia    during pregnancy  . Breast cancer of upper-outer quadrant of right female breast ([facility]) 01/27/2015  . Depression    hx denies any problems now  . History of kidney cancer 12/2015   left removed "spot"  . Lymphedema    rt arm  . Neuropathy   . Sickle cell trait (HCC)     Allergies Allergies  Allergen Reactions  . Sulfa Antibiotics Itching    Vaginal bleeding    IV Location/Drains/Wounds Patient Lines/Drains/Airways Status   Active Line/Drains/Airways    Name:   Placement date:   Placement time:   Site:   Days:   Implanted Port 04/04/17 Left Chest   04/04/17    0811    Chest   246   Incision (Closed) 04/04/17 Neck   04/04/17    0826     246          Labs/Imaging Results for orders placed or performed during the hospital encounter of 12/05/17 (from the past 48 hour(s))  Basic metabolic panel     Status: Abnormal   Collection Time: 12/05/17  9:17 PM  Result Value Ref Range   Sodium 137 135 - 145 mmol/L   Potassium 3.7 3.5 - 5.1 mmol/L   Chloride 101 101 - 111 mmol/L   CO2 24 22 - 32 mmol/L   Glucose, Bld 132 (H) 65 - 99 mg/dL   BUN 11 6 - 20 mg/dL   Creatinine, Ser 0.65 0.44 - 1.00 mg/dL   Calcium 9.3 8.9 - 10.3 mg/dL   GFR calc non Af Amer >60 >60 mL/min   GFR calc Af Amer >60 >60 mL/min    Comment: (NOTE) The eGFR has been calculated using the CKD EPI equation. This calculation has not been validated in all clinical situations. eGFR's persistently <60 mL/min signify possible Chronic Kidney Disease.    Anion gap 12 5 - 15    Comment: Performed at [facility], [place] [address], [place], [place] [address]  CBC     Status: Abnormal   Collection Time: 12/05/17  9:17 PM  Result Value Ref Range   WBC 6.7 4.0 - 10.5 K/uL   RBC 3.99 3.87 - 5.11 MIL/uL   Hemoglobin 11.0 (L) 12.0 - 15.0 g/dL   HCT 33.3 (L) 36.0 - 46.0 %  MCV 83.5 78.0 - 100.0 fL   MCH 27.6 26.0 - 34.0 pg   MCHC 33.0 30.0 - 36.0 g/dL   RDW 19.0 (H) 11.5 - 15.5 %   Platelets 313 150 - 400 K/uL    Comment: Performed at [facility], [place] [address], [place], [place] [address]  Lipase, blood     Status: None   Collection Time: 12/05/17  9:17 PM  Result Value Ref Range   Lipase 17 11 - 51 U/L    Comment: Performed at [facility], [place] [address] [place], [place] [address]  Hepatic function panel     Status: Abnormal   Collection Time: 12/05/17  9:17 PM  Result Value Ref Range   Total Protein 6.8 6.5 - 8.1 g/dL   Albumin 3.1 (L) 3.5 - 5.0 g/dL   AST 30 15 - 41 U/L   ALT 29 14 - 54 U/L   Alkaline Phosphatase 261 (H) 38 - 126 U/L   Total Bilirubin 0.7 0.3 - 1.2 mg/dL   Bilirubin, Direct 0.1 0.1 - 0.5 mg/dL   Indirect Bilirubin 0.6 0.3 - 0.9 mg/dL    Comment: Performed at [facility], [place] [address], [place], [place] [address]  I-stat troponin, ED      Status: None   Collection Time: 12/05/17  9:27 PM  Result Value Ref Range   Troponin i, poc 0.00 0.00 - 0.08 ng/mL   Comment 3            Comment: Due to the release kinetics of cTnI, a negative result within the first hours of the onset of symptoms does not rule out myocardial infarction with certainty. If myocardial infarction is still suspected, repeat the test at appropriate intervals.   I-Stat beta hCG blood, ED     Status: Abnormal   Collection Time: 12/05/17  9:27 PM  Result Value Ref Range   I-stat hCG, quantitative 5.4 (H) <5 mIU/mL   Comment 3            Comment:   GEST. AGE      CONC.  (mIU/mL)   <=1 WEEK        5 - 50     2 WEEKS       50 - 500     3 WEEKS       100 - 10,000     4 WEEKS     1,000 - 30,000        FEMALE AND NON-PREGNANT FEMALE:     LESS THAN 5 mIU/mL   Urinalysis, Routine w reflex microscopic     Status: Abnormal   Collection Time: 12/05/17 10:11 PM  Result Value Ref Range   Color, Urine YELLOW YELLOW   APPearance CLEAR CLEAR   Specific Gravity, Urine 1.012 1.005 - 1.030   pH 7.0 5.0 - 8.0   Glucose, UA NEGATIVE NEGATIVE mg/dL   Hgb urine dipstick NEGATIVE NEGATIVE   Bilirubin Urine NEGATIVE NEGATIVE   Ketones, ur NEGATIVE NEGATIVE mg/dL   Protein, ur 30 (A) NEGATIVE mg/dL   Nitrite NEGATIVE NEGATIVE   Leukocytes, UA NEGATIVE NEGATIVE   RBC / HPF 0-5 0 - 5 RBC/hpf   WBC, UA 0-5 0 - 5 WBC/hpf   Bacteria, UA NONE SEEN NONE SEEN   Squamous Epithelial / LPF 0-5 (A) NONE SEEN   Mucus PRESENT     Comment: Performed at [facility], [place] [address], [place], [place] [address]  Dg Chest 2 View  Result Date: 12/05/2017 CLINICAL DATA:  Chest pain cough and shortness of breath. History of breast cancer. EXAM: CHEST  2 VIEW COMPARISON:  Chest CT 11/04/2017 FINDINGS: Power injectable left chest wall Port-A-Cath follows a left subclavian approach with tip in the lower SVC. There is a medium-sized left pleural effusion with associated  atelectasis. The right lung is clear. There are right axillary surgical clips and postsurgical changes of the right breast. IMPRESSION: Medium-sized left pleural effusion with associated atelectasis, unchanged from chest CT of 11/04/2017. Electronically Signed   By: Ulyses Jarred M.D.   On: 12/05/2017 22:02    Pending Labs Unresulted Labs (From admission, onward)   Start     Ordered   12/06/17 0207  HIV antibody (Routine Testing)  Once,   R     12/06/17 0208      Vitals/Pain Today's Vitals   12/06/17 1114 12/06/17 1125 12/06/17 1139 12/06/17 1359  BP:  125/89  (!) 128/93  Pulse:  (!) 120  (!) 113  Resp:  (!) 26  14  Temp:      TempSrc:      SpO2:  93%  99%  Weight:      Height:      PainSc: 7   5      Isolation Precautions No active isolations  Medications Medications  benazepril (LOTENSIN) tablet 10 mg (10 mg Oral Refused 12/06/17 1128)  oxyCODONE (Oxy IR/ROXICODONE) immediate release tablet 5-10 mg (not administered)  pantoprazole (PROTONIX) EC tablet 40 mg (40 mg Oral Refused 12/06/17 1128)  multivitamin with minerals tablet 1 tablet (1 tablet Oral Refused 12/06/17 1128)  potassium chloride SA (K-DUR,KLOR-CON) CR tablet 20 mEq (20 mEq Oral Refused 12/06/17 1128)  rivaroxaban (XARELTO) tablet 20 mg (not administered)  prochlorperazine (COMPAZINE) tablet 10 mg (not administered)  gabapentin (NEURONTIN) capsule 300 mg (300 mg Oral Refused 12/06/17 1128)  acetaminophen (TYLENOL) tablet 650 mg (not administered)    Or  acetaminophen (TYLENOL) suppository 650 mg (not administered)  ondansetron (ZOFRAN) tablet 4 mg (not administered)    Or  ondansetron (ZOFRAN) injection 4 mg (not administered)  0.9 %  sodium chloride infusion ( Intravenous New Bag/Given 12/06/17 1138)  morphine 2 MG/ML injection 2 mg (2 mg Intravenous Given 12/06/17 1139)  albuterol (PROVENTIL) (2.5 MG/3ML) 0.083% nebulizer solution 5 mg (5 mg Nebulization Given 12/05/17 2109)  gi cocktail  (Maalox,Lidocaine,Donnatal) (30 mLs Oral Given 12/05/17 2218)  diazepam (VALIUM) injection 2.5 mg (2.5 mg Intravenous Given 12/05/17 2242)  sodium chloride 0.9 % bolus 1,000 mL (0 mLs Intravenous Stopped 12/05/17 2345)  peppermint spirit ( Other Given 12/06/17 0036)  dicyclomine (BENTYL) capsule 10 mg (10 mg Oral Given 12/05/17 2348)  famotidine (PEPCID) IVPB 20 mg (20 mg Intravenous Given 12/05/17 2345)  HYDROmorphone (DILAUDID) injection 0.5 mg (0.5 mg Intravenous Given 12/06/17 0045)  diltiazem (CARDIZEM) tablet 60 mg (60 mg Oral Given 12/06/17 0144)    Mobility walks

## 2017-12-06 NOTE — Progress Notes (Signed)
PHARMACIST - PHYSICIAN ORDER COMMUNICATION  CONCERNING: P&T Medication Policy on Herbal Medications  DESCRIPTION:  This patient's order for:  Black pepper-tumeric  has been noted.  This product(s) is classified as an "herbal" or natural product. Due to a lack of definitive safety studies or FDA approval, nonstandard manufacturing practices, plus the potential risk of unknown drug-drug interactions while on inpatient medications, the Pharmacy and Therapeutics Committee does not permit the use of "herbal" or natural products of this type within [facility].   ACTION TAKEN: The pharmacy department is unable to verify this order at this time and your patient has been informed of this safety policy. Please reevaluate patient's clinical condition at discharge and address if the herbal or natural product(s) should be resumed at that time.

## 2017-12-06 NOTE — ED Notes (Signed)
Bed: WA09 Expected date:  Expected time:  Means of arrival:  Comments: Room 5 

## 2017-12-07 DIAGNOSIS — K224 Dyskinesia of esophagus: Secondary | ICD-10-CM | POA: Diagnosis not present

## 2017-12-07 MED ORDER — PANTOPRAZOLE SODIUM 40 MG PO TBEC: 40.0000 mg | DELAYED_RELEASE_TABLET | Freq: Every day | ORAL | 1 refills | Status: AC

## 2017-12-07 MED ORDER — TRAZODONE HCL 100 MG PO TABS: 100.0000 mg | ORAL_TABLET | Freq: Every evening | ORAL | 0 refills | Status: AC | PRN

## 2017-12-07 MED ORDER — NITROGLYCERIN 0.4 MG SL SUBL: 0.4000 mg | SUBLINGUAL_TABLET | SUBLINGUAL | Status: DC | PRN

## 2017-12-07 MED ORDER — HEPARIN SOD (PORK) LOCK FLUSH 100 UNIT/ML IV SOLN
500.0000 [IU] | INTRAVENOUS | Status: AC | PRN
  Administered 2017-12-07: 500 [IU]

## 2017-12-07 MED ORDER — TRAZODONE HCL 100 MG PO TABS: 100.0000 mg | ORAL_TABLET | Freq: Every evening | ORAL | Status: DC | PRN

## 2017-12-07 MED ORDER — NITROGLYCERIN 0.4 MG SL SUBL: 0.4000 mg | SUBLINGUAL_TABLET | SUBLINGUAL | 12 refills | Status: AC | PRN

## 2017-12-07 MED ORDER — AMLODIPINE BESYLATE 5 MG PO TABS: 5.0000 mg | ORAL_TABLET | Freq: Every day | ORAL | 1 refills | Status: DC

## 2017-12-07 MED ORDER — AMLODIPINE BESYLATE 5 MG PO TABS: 5.0000 mg | ORAL_TABLET | Freq: Every day | ORAL | Status: DC

## 2017-12-07 MED ORDER — OXYCODONE HCL 5 MG PO TABS: 5.0000 mg | ORAL_TABLET | ORAL | 0 refills | Status: DC | PRN

## 2017-12-07 NOTE — Discharge Summary (Signed)
Phyllis Pruitt, is a 47 y.o. female  DOB Apr 24, 1971  MRN [id number].  Admission date:  12/05/2017  Admitting Physician  Etta Quill, DO  Discharge Date:  12/07/2017   Primary MD  [name], MD  Recommendations for primary care physician for things to follow:  [facility] to be seen next week [facility]   Admission Diagnosis  Esophageal spasm [K22.4]   Discharge Diagnosis  Esophageal spasm [K22.4]   Principal Problem:   Esophageal spasm Active Problems:   Breast cancer of upper-outer quadrant of right female breast ([place])   Bone metastases ([place])   Liver metastases ([place])      Past Medical History:  Diagnosis Date  . Anemia    during pregnancy  . Breast cancer of upper-outer quadrant of right female breast ([place]) 01/27/2015  . Depression    hx denies any problems now  . History of kidney cancer 12/2015   left removed "spot"  . Lymphedema    rt arm  . Neuropathy   . Sickle cell trait [facility])     Past Surgical History:  Procedure Laterality Date  . LATISSIMUS FLAP TO BREAST Right 06/03/2016   Procedure: RIGHT LATISSIMUS FLAP TO BREAST;  Surgeon: Irene Limbo, MD;  Location: [place];  Service: Plastics;  Laterality: Right;  . LIPOSUCTION WITH LIPOFILLING Right 09/20/2016   Procedure: LIPOSUCTION WITH LIPOFILLING;  Surgeon: Irene Limbo, MD;  Location: [place];  Service: Plastics;  Laterality: Right;  LIPOSUCTION WITH LIPOFILLING  . MASTECTOMY    . MASTECTOMY MODIFIED RADICAL Right 07/28/2015  . MASTECTOMY MODIFIED RADICAL Right 07/28/2015   Procedure: RIGHT MODIFIED RADICAL MASTECTOMY;  Surgeon: Alphonsa Overall, MD;  Location: [place];  Service: General;  Laterality: Right;  . PORT-A-CATH REMOVAL  07/28/2015  . PORT-A-CATH REMOVAL  07/28/2015   Procedure: REMOVAL PORT-A-CATH;  Surgeon: Alphonsa Overall, MD;  Location: [place];  Service: General;;  . PORTACATH PLACEMENT N/A  02/10/2015   Procedure: INSERTION PORT-A-CATH WITH ULTRA SOUND, left subclavian,;  Surgeon: Alphonsa Overall, MD;  Location: [facility];  Service: General;  Laterality: N/A;  . PORTACATH PLACEMENT N/A 04/04/2017   Procedure: POWER PORT PLACEMENT;  Surgeon: Alphonsa Overall, MD;  Location: [facility];  Service: General;  Laterality: N/A;  . REMOVAL OF TISSUE EXPANDER AND PLACEMENT OF IMPLANT Right 09/20/2016   Procedure: REMOVAL OF RIGHT TISSUE EXPANDER WITH PLACEMENT OF RIGHT SILICONE BREAST IMPLANTS LIPOFILLING FROM ABDOMEN TO RIGHT CHEST;  Surgeon: Irene Limbo, MD;  Location: [place];  Service: Plastics;  Laterality: Right;  PLACEMENT OF RIGHT SILICONE BREAST IMPLANTS LIPOFILLING FROM ABDOMEN TO RIGHT CHEST  . ROBOTIC ASSITED PARTIAL NEPHRECTOMY Left 12/29/2015   Procedure: XI ROBOTIC ASSITED PARTIAL NEPHRECTOMY;  Surgeon: Cleon Gustin, MD;  Location: [facility];  Service: Urology;  Laterality: Left;  . TISSUE EXPANDER PLACEMENT Right 06/03/2016   Procedure: PLACEMENT RIGHT TISSUE EXPANDER;  Surgeon: Irene Limbo, MD;  Location: [place];  Service: Plastics;  Laterality: Right;  . TONSILLECTOMY    . TUBAL LIGATION    .  wisdome teeth extraction         HPI  from the history and physical done on the day of admission:    Phyllis Pruitt is a 47 y.o. female with medical history significant of BRCA with mets to liver and bone.  Patient on chemotherapy, just started letuda.  She presents to the ED with episodic, severe, chest pain.  Pain located in central region of chest.  Becomes severe when ever she tries to swallow anything.  Pain is brief, aching and cramping.  No radiation.  No N/V.  Home oxy provides no relief.   ED Course: Suspicious for esophageal spasms, treatments in ED tried include: pepcid, valium, bentyl, dilaudid, GI cocktail, omeprazole, and peppermint spirit.  Patient has some improvement, but still is essentially unable to take POs, extreme pain with even a sip of  water.       Hospital Course:  Phyllis Whisnant Tayloris a 47 y.o.femalewith medical history significant ofBRCA with mets to liver and bone, on chemotherapy, presenting with chest pain with swallowing.  Patient admitted for suspected esophageal spasms on account of failure of improvement of symptoms at their ED with Pepcid, Valium, Bentyl, Dilaudid, GI cocktail, omeprazole and peppermint spirit.  Chest pain: Resolved Atypical GI etiology suspected- esophageal spasm Continue PPI Benazepril switched to CCB, and started on trazodone for possible esophageal spasms NTG prn prescribed on discharge for poss esophageal spasms- educated about risk of hypotension Tolerating jello and diet to be advanced as tolerated Discussed with Dr Watt Climes of [facility] who has agrred to see in the office for f/u      Discharge Condition: Stable  Follow UP - PCP and [facility]     Consults obtained - N/A  Diet and Activity recommendation:  As advised  Discharge Instructions     Discharge Instructions    Call MD for:  difficulty breathing, headache or visual disturbances   Complete by:  As directed    Call MD for:  extreme fatigue   Complete by:  As directed    Call MD for:  hives   Complete by:  As directed    Call MD for:  persistant dizziness or light-headedness   Complete by:  As directed    Call MD for:  persistant nausea and vomiting   Complete by:  As directed    Call MD for:  redness, tenderness, or signs of infection (pain, swelling, redness, odor or green/yellow discharge around incision site)   Complete by:  As directed    Call MD for:  severe uncontrolled pain   Complete by:  As directed    Call MD for:  temperature >100.4   Complete by:  As directed    Diet - low sodium heart healthy   Complete by:  As directed    Increase activity slowly   Complete by:  As directed         Discharge Medications     Allergies as of 12/07/2017      Reactions   Sulfa Antibiotics Itching    Vaginal bleeding      Medication List    STOP taking these medications   benazepril 10 MG tablet Commonly known as:  LOTENSIN   omeprazole 20 MG tablet Commonly known as:  PRILOSEC OTC Replaced by:  pantoprazole 40 MG tablet     TAKE these medications   amLODipine 5 MG tablet Commonly known as:  NORVASC Take 1 tablet (5 mg total) by mouth daily.  Black Pepper-Turmeric 3-500 MG Caps Take 1 capsule by mouth daily.   gabapentin 300 MG capsule Commonly known as:  NEURONTIN Take 1 capsule (300 mg total) by mouth 3 (three) times daily. What changed:  when to take this   lidocaine-prilocaine cream Commonly known as:  EMLA Apply to affected area once   multivitamin tablet Take 1 tablet by mouth daily.   nitroGLYCERIN 0.4 MG SL tablet Commonly known as:  NITROSTAT Place 1 tablet (0.4 mg total) under the tongue every 5 (five) minutes as needed for chest pain (call MD if not better after 2 doses).   oxyCODONE 5 MG immediate release tablet Commonly known as:  Oxy IR/ROXICODONE Take 1-2 tablets (5-10 mg total) by mouth every 4 (four) hours as needed for severe pain.   pantoprazole 40 MG tablet Commonly known as:  PROTONIX Take 1 tablet (40 mg total) by mouth daily. Replaces:  omeprazole 20 MG tablet   potassium chloride SA 20 MEQ tablet Commonly known as:  K-DUR,KLOR-CON TAKE 1 TABLET(20 MEQ) BY MOUTH DAILY   PROBIOTIC PO Take 1 tablet by mouth daily.   prochlorperazine 10 MG tablet Commonly known as:  COMPAZINE Take 1 tablet (10 mg total) by mouth every 6 (six) hours as needed for nausea or vomiting.   rivaroxaban 20 MG Tabs tablet Commonly known as:  XARELTO Take 1 tablet (20 mg total) by mouth daily with supper.   traZODone 100 MG tablet Commonly known as:  DESYREL Take 1 tablet (100 mg total) by mouth at bedtime as needed for sleep.       Major procedures and Radiology Reports - PLEASE review detailed and final reports for all details, in brief -    Dg  Chest 2 View  Result Date: 12/05/2017 CLINICAL DATA:  Chest pain cough and shortness of breath. History of breast cancer. EXAM: CHEST  2 VIEW COMPARISON:  Chest CT 11/04/2017 FINDINGS: Power injectable left chest wall Port-A-Cath follows a left subclavian approach with tip in the lower SVC. There is a medium-sized left pleural effusion with associated atelectasis. The right lung is clear. There are right axillary surgical clips and postsurgical changes of the right breast. IMPRESSION: Medium-sized left pleural effusion with associated atelectasis, unchanged from chest CT of 11/04/2017. Electronically Signed   By: Ulyses Jarred M.D.   On: 12/05/2017 22:02   Mr Thoracic Spine W Wo Contrast  Result Date: 11/18/2017 CLINICAL DATA:  Back pain.  Metastatic breast cancer. EXAM: MRI THORACIC AND LUMBAR SPINE WITHOUT AND WITH CONTRAST TECHNIQUE: Multiplanar and multiecho pulse sequences of the thoracic and lumbar spine were obtained without and with intravenous contrast. CONTRAST:  82m MULTIHANCE GADOBENATE DIMEGLUMINE 529 MG/ML IV SOLN COMPARISON:  CT scans dated 11/04/2017 and PET-CT dated 07/28/2017 FINDINGS: MRI THORACIC SPINE FINDINGS Alignment:  Physiologic. Vertebrae: There is metastatic disease involving the entire thoracic spine with the most extensive metastases being and T6 through T9. At those levels there is more extensive involvement of the vertebral bodies and posterior elements than at other levels. However, there is no discrete pathologic fracture. After contrast administration there is epidural enhancement posteriorly at T5 through T8 without mass effect upon the thecal sac. There is no spinal cord compression. Cord: No mass lesion or myelopathy or spinal cord compression. Paraspinal and other soft tissues: Tiny right pleural effusion. Moderate left effusion, increased since the prior CT scan of 11/04/2017. Radiation fibrosis in the upper lobe of the right lung. Disc levels: The discs throughout the  thoracic spine appear  normal. No spinal or foraminal stenosis. MRI LUMBAR SPINE FINDINGS Segmentation:  Standard. Alignment:  Physiologic. Vertebrae: Metastatic disease throughout the lumbar spine. No pathologic fractures. Enhancing tumor involves the second and third sacral segments with a tumor mass extending into the posterior aspect of the distal sacral canal at S3 and S4 asymmetric to the left. Conus medullaris: Extends to the L1-2 level and appears normal. Paraspinal and other soft tissues: Negative. Disc levels: L1-2 and L2-3: Normal discs. Metastatic disease in the vertebra. No extension of tumor into the spinal canal. L3-4: Tiny broad-based disc bulge. Metastatic disease in the vertebral body and posterior elements. Slight facet arthritis bilaterally. L4-5: Tiny broad-based disc bulge without neural impingement. Metastatic disease infiltrates the left superior facet of L5. No extension into the spinal canal. L5-S1 focal disc protrusion paracentral and to the left with a slight mass effect upon the left S1 nerve. No tumor extension into the spinal canal. Enhancing tumor in the spinous process of L5. Sacrum: Soft tissue mass involving the posterior aspect of the S3 segment of the sacrum extending into the left side of the sacral canal. IMPRESSION: 1. Diffuse metastatic disease involving the entire thoracic and lumbar spine without pathologic fractures. 2. Soft tissue mass in the posterior aspect of the S3 and S4 segments of the sacrum does extend into the left side of the sacral spinal canal. 3. Small focal disc protrusion at L5-S1 to the left with a slight mass effect upon the left S1 nerve root sleeve. 4. Tumor is most extensive in the midthoracic spine particularly involving the posterior elements from T5-T8. Abnormal epidural enhancement posteriorly at those levels probably represents tumor in the spinal canal. However, there is no mass effect upon the thecal sac or spinal cord. Electronically Signed    By: Lorriane Shire M.D.   On: 11/18/2017 12:44   Mr Lumbar Spine W Wo Contrast  Result Date: 11/18/2017 CLINICAL DATA:  Back pain.  Metastatic breast cancer. EXAM: MRI THORACIC AND LUMBAR SPINE WITHOUT AND WITH CONTRAST TECHNIQUE: Multiplanar and multiecho pulse sequences of the thoracic and lumbar spine were obtained without and with intravenous contrast. CONTRAST:  24m MULTIHANCE GADOBENATE DIMEGLUMINE 529 MG/ML IV SOLN COMPARISON:  CT scans dated 11/04/2017 and PET-CT dated 07/28/2017 FINDINGS: MRI THORACIC SPINE FINDINGS Alignment:  Physiologic. Vertebrae: There is metastatic disease involving the entire thoracic spine with the most extensive metastases being and T6 through T9. At those levels there is more extensive involvement of the vertebral bodies and posterior elements than at other levels. However, there is no discrete pathologic fracture. After contrast administration there is epidural enhancement posteriorly at T5 through T8 without mass effect upon the thecal sac. There is no spinal cord compression. Cord: No mass lesion or myelopathy or spinal cord compression. Paraspinal and other soft tissues: Tiny right pleural effusion. Moderate left effusion, increased since the prior CT scan of 11/04/2017. Radiation fibrosis in the upper lobe of the right lung. Disc levels: The discs throughout the thoracic spine appear normal. No spinal or foraminal stenosis. MRI LUMBAR SPINE FINDINGS Segmentation:  Standard. Alignment:  Physiologic. Vertebrae: Metastatic disease throughout the lumbar spine. No pathologic fractures. Enhancing tumor involves the second and third sacral segments with a tumor mass extending into the posterior aspect of the distal sacral canal at S3 and S4 asymmetric to the left. Conus medullaris: Extends to the L1-2 level and appears normal. Paraspinal and other soft tissues: Negative. Disc levels: L1-2 and L2-3: Normal discs. Metastatic disease in the vertebra. No extension  of tumor into the  spinal canal. L3-4: Tiny broad-based disc bulge. Metastatic disease in the vertebral body and posterior elements. Slight facet arthritis bilaterally. L4-5: Tiny broad-based disc bulge without neural impingement. Metastatic disease infiltrates the left superior facet of L5. No extension into the spinal canal. L5-S1 focal disc protrusion paracentral and to the left with a slight mass effect upon the left S1 nerve. No tumor extension into the spinal canal. Enhancing tumor in the spinous process of L5. Sacrum: Soft tissue mass involving the posterior aspect of the S3 segment of the sacrum extending into the left side of the sacral canal. IMPRESSION: 1. Diffuse metastatic disease involving the entire thoracic and lumbar spine without pathologic fractures. 2. Soft tissue mass in the posterior aspect of the S3 and S4 segments of the sacrum does extend into the left side of the sacral spinal canal. 3. Small focal disc protrusion at L5-S1 to the left with a slight mass effect upon the left S1 nerve root sleeve. 4. Tumor is most extensive in the midthoracic spine particularly involving the posterior elements from T5-T8. Abnormal epidural enhancement posteriorly at those levels probably represents tumor in the spinal canal. However, there is no mass effect upon the thecal sac or spinal cord. Electronically Signed   By: Lorriane Shire M.D.   On: 11/18/2017 12:44    Micro Results    No results found for this or any previous visit (from the past 240 hour(s)).     Today   Subjective    Constancia Geeting today has no  Chest pains or abdominal pains. Tolerating drinks and jellow without chest pains today as prevously and wants to go home. Patient and husband updated.          Patient has been seen and examined prior to discharge   Objective   Blood pressure (!) 145/98, pulse (!) 126, temperature 100 F (37.8 C), temperature source Oral, resp. rate 17, height '5\' 5"'$  (1.651 m), weight 65.3 kg (143 lb 15.4 oz), SpO2  95 %.   Intake/Output Summary (Last 24 hours) at 12/07/2017 1201 Last data filed at 12/07/2017 9518 Gross per 24 hour  Intake 1693.75 ml  Output 0 ml  Net 1693.75 ml    Exam   General: NAD  HEENT: NCAT,  PERRL,MMM  Neck: SUPPLE, (-) JVD  Cardiovascular: RRR, (-) GALLOP, (-) MURMUR  Respiratory: CTA  Gastrointestinal: SOFT, (-) DISTENSION, BS(+), (_) TENDERNESS  Ext: (-) CYANOSIS, (-) EDEMA  Neuro: A, OX 3  Skin:(-) RASH  Psych:NORMAL AFFECT/MOOD      Data Review   CBC w Diff:  Lab Results  Component Value Date   WBC 6.7 12/05/2017   HGB 11.0 (L) 12/05/2017   HGB 10.7 (L) 10/10/2017   HCT 33.3 (L) 12/05/2017   HCT 32.1 (L) 10/10/2017   PLT 313 12/05/2017   PLT 314 12/03/2017   PLT 272 10/10/2017   LYMPHOPCT 6 12/03/2017   LYMPHOPCT 19.4 10/10/2017   MONOPCT 5 12/03/2017   MONOPCT 4.2 10/10/2017   EOSPCT 1 12/03/2017   EOSPCT 0.0 10/10/2017   BASOPCT 0 12/03/2017   BASOPCT 1.1 10/10/2017    CMP:  Lab Results  Component Value Date   NA 137 12/05/2017   NA 142 10/10/2017   K 3.7 12/05/2017   K 3.4 (L) 10/10/2017   CL 101 12/05/2017   CO2 24 12/05/2017   CO2 24 10/10/2017   BUN 11 12/05/2017   BUN 7.0 10/10/2017   CREATININE 0.65 12/05/2017  CREATININE 0.74 12/03/2017   CREATININE 0.8 10/10/2017   PROT 6.8 12/05/2017   PROT 7.6 10/10/2017   ALBUMIN 3.1 (L) 12/05/2017   ALBUMIN 3.4 (L) 10/10/2017   BILITOT 0.7 12/05/2017   BILITOT 0.3 12/03/2017   BILITOT 0.31 10/10/2017   ALKPHOS 261 (H) 12/05/2017   ALKPHOS 208 (H) 10/10/2017   AST 30 12/05/2017   AST 33 12/03/2017   AST 29 10/10/2017   ALT 29 12/05/2017   ALT 38 12/03/2017   ALT 20 10/10/2017  .   Total Discharge time is about 33 minutes  OSEI-BONSU,Georgena Weisheit M.D on 12/07/2017 at 12:01 PM  [facility]   Office  [phone]  Dragon dictation system was used to create this note, attempts have been made to correct errors, however presence of uncorrected errors is not a  reflection quality of care provided

## 2017-12-09 ENCOUNTER — Inpatient Hospital Stay: Payer: 59

## 2017-12-09 ENCOUNTER — Inpatient Hospital Stay: Payer: 59 | Admitting: *Deleted

## 2017-12-09 ENCOUNTER — Other Ambulatory Visit: Payer: Self-pay

## 2017-12-09 ENCOUNTER — Encounter: Payer: Self-pay | Admitting: Radiation Oncology

## 2017-12-09 ENCOUNTER — Inpatient Hospital Stay (HOSPITAL_BASED_OUTPATIENT_CLINIC_OR_DEPARTMENT_OTHER): Payer: 59 | Admitting: Hematology and Oncology

## 2017-12-09 VITALS — BP 126/88 | HR 114 | Temp 98.9°F | Resp 18

## 2017-12-09 DIAGNOSIS — Z171 Estrogen receptor negative status [ER-]: Secondary | ICD-10-CM

## 2017-12-09 DIAGNOSIS — K209 Esophagitis, unspecified: Secondary | ICD-10-CM

## 2017-12-09 DIAGNOSIS — C50411 Malignant neoplasm of upper-outer quadrant of right female breast: Secondary | ICD-10-CM

## 2017-12-09 DIAGNOSIS — T451X5A Adverse effect of antineoplastic and immunosuppressive drugs, initial encounter: Principal | ICD-10-CM

## 2017-12-09 DIAGNOSIS — E860 Dehydration: Secondary | ICD-10-CM

## 2017-12-09 DIAGNOSIS — C773 Secondary and unspecified malignant neoplasm of axilla and upper limb lymph nodes: Secondary | ICD-10-CM

## 2017-12-09 DIAGNOSIS — Z5112 Encounter for antineoplastic immunotherapy: Secondary | ICD-10-CM | POA: Diagnosis not present

## 2017-12-09 DIAGNOSIS — K224 Dyskinesia of esophagus: Secondary | ICD-10-CM

## 2017-12-09 DIAGNOSIS — G620 Drug-induced polyneuropathy: Secondary | ICD-10-CM

## 2017-12-09 DIAGNOSIS — Z95828 Presence of other vascular implants and grafts: Secondary | ICD-10-CM

## 2017-12-09 LAB — CBC WITH DIFFERENTIAL (CANCER CENTER ONLY)
Basophils Absolute: 0 10*3/uL (ref 0.0–0.1)
Basophils Relative: 0 %
EOS ABS: 0.1 10*3/uL (ref 0.0–0.5)
EOS PCT: 3 %
HCT: 32 % — ABNORMAL LOW (ref 34.8–46.6)
Hemoglobin: 10.3 g/dL — ABNORMAL LOW (ref 11.6–15.9)
LYMPHS ABS: 0.2 10*3/uL — AB (ref 0.9–3.3)
LYMPHS PCT: 5 %
MCH: 27 pg (ref 25.1–34.0)
MCHC: 32.3 g/dL (ref 31.5–36.0)
MCV: 83.7 fL (ref 79.5–101.0)
MONO ABS: 0.5 10*3/uL (ref 0.1–0.9)
MONOS PCT: 13 %
Neutro Abs: 2.7 10*3/uL (ref 1.5–6.5)
Neutrophils Relative %: 79 %
PLATELETS: 202 10*3/uL (ref 145–400)
RBC: 3.82 MIL/uL (ref 3.70–5.45)
RDW: 20.6 % — AB (ref 11.2–14.5)
WBC Count: 3.5 10*3/uL — ABNORMAL LOW (ref 3.9–10.3)

## 2017-12-09 LAB — CMP (CANCER CENTER ONLY)
ALBUMIN: 2.6 g/dL — AB (ref 3.5–5.0)
ALT: 25 U/L (ref 0–55)
AST: 31 U/L (ref 5–34)
Alkaline Phosphatase: 284 U/L — ABNORMAL HIGH (ref 40–150)
Anion gap: 11 (ref 3–11)
BUN: 11 mg/dL (ref 7–26)
CHLORIDE: 107 mmol/L (ref 98–109)
CO2: 25 mmol/L (ref 22–29)
CREATININE: 0.7 mg/dL (ref 0.60–1.10)
Calcium: 9.9 mg/dL (ref 8.4–10.4)
GFR, Est AFR Am: >60 mL/min (ref >60)
GLUCOSE: 98 mg/dL (ref 70–140)
POTASSIUM: 3.3 mmol/L — AB (ref 3.5–5.1)
Sodium: 143 mmol/L (ref 136–145)
Total Bilirubin: 0.4 mg/dL (ref 0.2–1.2)
Total Protein: 7.2 g/dL (ref 6.4–8.3)

## 2017-12-09 LAB — TSH: TSH: 1.275 u[IU]/mL (ref 0.308–3.960)

## 2017-12-09 MED ORDER — SODIUM CHLORIDE 0.9% FLUSH
10.0000 mL | INTRAVENOUS | Status: DC | PRN
  Administered 2017-12-09: 10 mL via INTRAVENOUS
  Filled 2017-12-09: qty 10

## 2017-12-09 MED ORDER — POTASSIUM CHLORIDE 2 MEQ/ML IV SOLN
Freq: Once | INTRAVENOUS | Status: AC
  Administered 2017-12-09: 12:00:00 via INTRAVENOUS
  Filled 2017-12-09: qty 1000

## 2017-12-09 MED ORDER — SODIUM CHLORIDE 0.9 % IV SOLN: Freq: Once | INTRAVENOUS | Status: DC

## 2017-12-09 MED ORDER — HEPARIN SOD (PORK) LOCK FLUSH 100 UNIT/ML IV SOLN
500.0000 [IU] | Freq: Once | INTRAVENOUS | Status: AC | PRN
  Administered 2017-12-09: 500 [IU] via INTRAVENOUS
  Filled 2017-12-09: qty 5

## 2017-12-09 NOTE — Progress Notes (Signed)
Pt for IVF only. OK to infuse @ 750/hr per Dr. [name].  Left port accessed with biopatch and sorbaview dressing as pt returning tomorrow for additional IVF

## 2017-12-09 NOTE — Progress Notes (Signed)
 Patient Care Team: Griffin, Elaine, MD as PCP - General (Family Medicine) Magrinat, Gustav C, MD as Consulting Physician (Oncology) Wentworth, Stacy, MD (Inactive) as Consulting Physician (Radiation Oncology) Stuart, Dawn C, RN as Registered Nurse Martini, Keisha N, RN as Registered Nurse Newman, David, MD as Consulting Physician (General Surgery) Mackey, Heather Thompson, NP as Nurse Practitioner (Hematology and Oncology)  DIAGNOSIS:  Encounter Diagnosis  Name Primary?  . Malignant neoplasm of upper-outer quadrant of right breast in female, estrogen receptor negative (HCC)     SUMMARY OF ONCOLOGIC HISTORY:   Breast cancer of upper-outer quadrant of right female breast (HCC)   01/19/2015 Mammogram    Right breast: density with indistinct margin at 12:00, posterior depth; also oval mass with obscured margin at 11:00, middle depth; irregular mass with indistinct margin central to nipple, middle depth      01/23/2015 Initial Biopsy    Right breast 9:00 and 10:00 biopsy ([facility]): Grade 3 IDC ER/PR negative, HER-2 negative, Ki-67 77% and 73%; the right axillary lymph node biopsy: ER 22% positive      01/30/2015 Breast MRI    Right breast masses 9 and 10:00 position, additional irregular masses with non-mass enhancement upper outer quadrant with one mass extending into upper inner quadrant, multifocal, multicentric 13.7 x 4.6 x 4.6 cm, malignant right axillary lymph node      02/08/2015 Clinical Stage    Stage IIIA: T3, N1, M0      02/09/2015 Procedure    BreastNext panel revealed VUS BRCA2 gene called p.[id number] (c.3749A>G); otherwise no clinically significant variant at ATM, BARD1, BRCA1, BRCA2, BRIP1, CDH1, CHEK2, MRE11A, MUTYH, NBN, NF1, PALB2, PTEN, RAD50, RAD51C, RAD51D, and TP53      02/15/2015 Imaging    No evidence of metastatic disease, 2.2 cm enhancing lesion anterior left lower kidney suspicious for solid renal neoplasm      02/20/2015 - 07/10/2015 Neo-Adjuvant Chemotherapy   dose dense doxorubicin and cyclophosphamide x 4 followed by Abraxane weekly x 12      07/10/2015 Breast MRI    Multiple irregular enhancing masses right breast are less confluent and smaller dominant mass 10:00 2.1 x 1.9 x 1.3 cm; right breast 11:00 mass measures 1.2 x 1.2 x 0.8 cm, decreased right axillary lymph node thickening      07/28/2015 Definitive Surgery    Rt mastectomy: Multifocal IDC 2 cm, 1.5 cm. 0.6 cm; 3/13 LN positive, ER/PR HER-2      07/28/2015 Pathologic Stage    Stage IIA: T1c N1 M0        09/03/2015 - 10/13/2015 Radiation Therapy    Adjuvant radiation therapy: Right chest wall/50.4 Gray @ 1.8 Gray per fraction x 28 fractions. Right supraclavicular fossa 45 Gy @1.8 Gy per fraction x 25 fractions      11/15/2015 - 05/10/2016 Chemotherapy    Adjuvant capecitabine - completed 8 cycles      11/16/2015 Survivorship    Survivorship visit completed and copy of care plan given to patient      02/23/2016 Procedure    Left mandible biopsy: Scant material, no malignancy      05/21/2016 -  Anti-estrogen oral therapy    Patient did not take tamoxifen because her ER was weakly positive at 22% and she was worried about toxicities      02/21/2017 Imaging    Rt Axilla 8 X 7 X 7 mm mass inv pectoralis musculature, additional mass 9 mm 1.5 cm apart      02/21/2017 Relapse/Recurrence      Right axillary mass biopsy: 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      03/24/2017 Miscellaneous    Foundation 1 analysis: Microsatellite stable; TMB 9 muts/Mb; MYC amplification, PIK3C2B p717L; TP53 [id number]      03/25/2017 - 07/15/2017 Chemotherapy    Carboplatin and gemcitabine palliative chemotherapy      07/28/2017 PET scan    Progression of metastatic disease new liver lesion on 0.6 cm SUV 7.2, previous liver lesion 1.4 cm unchanged but shows no activity; scattered hypermetabolic bone lesions some of which are new, scattered pulmonary nodules stable hypermetabolic right subpectoral and  mediastinal/internal mammary nodes stable      08/22/2017 - 09/10/2017 Radiation Therapy    Palliative radiation to brachial plexus concurrently with Xeloda      09/12/2017 - 10/31/2017 Chemotherapy    Halaven day 1 day 8 every 3 weeks       12/03/2017 -  Chemotherapy    Pembrolizumab every 3 weeks        Renal cell carcinoma of left kidney ([place])   01/29/2016 Surgery    Left renal mass partial resection: 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  She received her first dose last week and is here today for follow-up.  On Friday she was in the emergency room with severe esophageal spasms she was not able to eat or drink anything.  The hospital she was given multiple medications and slowly her symptoms eased off.  However she still has esophageal irritation and pain and discomfort.  She has not been eating or drinking much since then.  She is able to get her medications in with some pain.  REVIEW OF SYSTEMS:   Constitutional: Denies fevers, chills or abnormal weight loss Eyes: Denies blurriness of vision Ears, nose, mouth, throat, and face: Denies mucositis or sore throat Respiratory: Denies cough, dyspnea or wheezes Cardiovascular: Denies palpitation, chest discomfort Gastrointestinal: Severe esophageal spasms and esophagitis Skin: Denies abnormal skin rashes Lymphatics: Denies new lymphadenopathy or easy bruising Neurological:Denies numbness, tingling or new weaknesses Behavioral/Psych: Mood is stable, no new changes  Extremities: No lower extremity edema  All other systems were reviewed with the patient and are negative.  I have reviewed the past  medical history, past surgical history, social history and family history with the patient and they are unchanged from previous note.  ALLERGIES:  is allergic to sulfa antibiotics.  MEDICATIONS:  Current Outpatient Medications  Medication Sig Dispense Refill  . amLODipine (NORVASC) 5 MG tablet Take 1 tablet (5 mg total) by mouth daily. 30 tablet 1  . Black Pepper-Turmeric 3-500 MG CAPS Take 1 capsule by mouth daily.    Marland Kitchen gabapentin (NEURONTIN) 300 MG capsule Take 1 capsule (300 mg total) by mouth 3 (three) times daily. (Patient taking differently: Take 300 mg by mouth 2 (two) times daily. ) 90 capsule 3  . lidocaine-prilocaine (EMLA) cream Apply to affected area once 30 g 3  . Multiple Vitamins-Minerals (MULTIVITAMIN) tablet Take 1 tablet by mouth daily.    . nitroGLYCERIN (NITROSTAT) 0.4 MG SL tablet Place 1 tablet (0.4 mg total) under the tongue every 5 (five) minutes as needed for chest pain (call MD if not better after 2 doses). [place]  tablet 12  . oxyCODONE (OXY IR/ROXICODONE) 5 MG immediate release tablet Take 1-2 tablets (5-10 mg total) by mouth every 4 (four) hours as needed for severe pain. 30 tablet 0  . pantoprazole (PROTONIX) 40 MG tablet Take 1 tablet (40 mg total) by mouth daily. 30 tablet 1  . potassium chloride SA (K-DUR,KLOR-CON) 20 MEQ tablet TAKE 1 TABLET(20 MEQ) BY MOUTH DAILY 30 tablet 0  . Probiotic Product (PROBIOTIC PO) Take 1 tablet by mouth daily.    . prochlorperazine (COMPAZINE) 10 MG tablet Take 1 tablet (10 mg total) by mouth every 6 (six) hours as needed for nausea or vomiting. 40 tablet 1  . rivaroxaban (XARELTO) 20 MG TABS tablet Take 1 tablet (20 mg total) by mouth daily with supper. 90 tablet 1  . traZODone (DESYREL) 100 MG tablet Take 1 tablet (100 mg total) by mouth at bedtime as needed for sleep. 30 tablet 0   Current Facility-Administered Medications  Medication Dose Route Frequency Provider Last Rate Last Dose  . sodium chloride 0.9 % 1,000 mL with potassium  chloride 10 mEq infusion   Intravenous Once Nicholas Lose, MD        PHYSICAL EXAMINATION: ECOG PERFORMANCE STATUS: 2 - Symptomatic, <50% confined to bed  Vitals:   12/09/17 1030  BP: (!) 120/92  Pulse: (!) 130  Resp: 18  Temp: 99 F (37.2 C)  SpO2: 99%   Filed Weights   12/09/17 1030  Weight: 139 lb 1.6 oz (63.1 kg)    GENERAL:alert, no distress and comfortable SKIN: skin color, texture, turgor are normal, no rashes or significant lesions EYES: normal, Conjunctiva are pink and non-injected, sclera clear OROPHARYNX:no exudate, no erythema and lips, buccal mucosa, and tongue normal  NECK: supple, thyroid normal size, non-tender, without nodularity LYMPH:  no palpable lymphadenopathy in the cervical, axillary or inguinal LUNGS: clear to auscultation and percussion with normal breathing effort HEART: regular rate & rhythm and no murmurs and no lower extremity edema ABDOMEN:abdomen soft, non-tender and normal bowel sounds MUSCULOSKELETAL:no cyanosis of digits and no clubbing  NEURO: alert & oriented x 3 with fluent speech, no focal motor/sensory deficits EXTREMITIES: Right arm pain and discomfort  LABORATORY DATA:  I have reviewed the data as listed CMP Latest Ref Rng & Units 12/09/2017 12/05/2017 12/03/2017  Glucose 70 - 140 mg/dL 98 132(H) 94  BUN 7 - 26 mg/dL '11 11 16  '$ Creatinine 0.60 - 1.10 mg/dL 0.70 0.65 0.74  Sodium 136 - 145 mmol/L 143 137 142  Potassium 3.5 - 5.1 mmol/L 3.3(L) 3.7 3.8  Chloride 98 - 109 mmol/L 107 101 104  CO2 22 - 29 mmol/L '25 24 26  '$ Calcium 8.4 - 10.4 mg/dL 9.9 9.3 9.5  Total Protein 6.4 - 8.3 g/dL 7.2 6.8 7.7  Total Bilirubin 0.2 - 1.2 mg/dL 0.4 0.7 0.3  Alkaline Phos 40 - 150 U/L 284(H) 261(H) 309(H)  AST 5 - 34 U/L 31 30 33  ALT 0 - 55 U/L 25 29 38    Lab Results  Component Value Date   WBC 3.5 (L) 12/09/2017   HGB 11.0 (L) 12/05/2017   HCT 32.0 (L) 12/09/2017   MCV 83.7 12/09/2017   PLT 202 12/09/2017   NEUTROABS 2.7 12/09/2017     ASSESSMENT & PLAN:  Breast cancer of upper-outer quadrant of right female breast ([place]) Right breast invasive ductal carcinoma multifocal, multicentric disease with at least 8 tumors, 2.3 cm, 1.6 cm of the biggest tumors in addition 6 more tumors 1  cm or less spanning 13.7 cm, right axillary lymph node biopsy positive (ER 22%); primary tumor is ER 0%, PR 0%, HER-2 negative, Ki-67 77% and 73% respectively, grade 3 T3 N1 M0 equals stage IIIa, Grade 3 1. Rt mastectomy 07/28/15: Multifocal IDC 2 cm, 1.5 cm. 0.6 cm; 3/13 LN positive, T1c N1 M0 stage IIA, ER/PR HER-2 negative. 2. Adjuvant XRT 09/03/15 to 10/13/15 3. Adjuvant Xeloda X 6 months (8 cycles) started 11/15/2015 completed July 2017 4. Carboplatin and gemcitabine started 03/14/2017 5. Halaven day 1 day 8 every 3 weeks started 09/12/2017-10/31/17 6.  Palliative radiation therapy to we will were lesion as well as subcutaneous lesions in the back ---------------------------------------------------------------------------- Treatment plan: Pembrolizumab immunotherapy: Today is cycle 1 day 8 Toxicities: 1.  Severe esophageal spasms and esophagitis: Patient has an appointment to see Dr. Watt Climes today. We discussed different options to treat esophagitis.  I will await the recommendations of GI. 2. severe dehydration: I will give her IV fluids with potassium today. She will come back Thursday and Saturday for additional IV fluids. If necessary we will give her fluids next week as well.  Hypokalemia: Monitoring potassium closely Goals of care: Palliation  Return to clinic in 2 weeks for cycle 2   I spent 25 minutes talking to the patient of which more than half was spent in counseling and coordination of care.  No orders of the defined types were placed in this encounter.  The patient has a good understanding of the overall plan. she agrees with it. she will call with any problems that may develop before the next visit here.   Harriette Ohara, MD 12/09/17

## 2017-12-09 NOTE — Progress Notes (Signed)
Orders placed.

## 2017-12-09 NOTE — Patient Instructions (Signed)

## 2017-12-09 NOTE — Progress Notes (Incomplete)
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    Indication for treatment:  Palliative       Radiation treatment dates:   11/19/17-12/02/17  Site/dose: 1) T-spine/ 30 Gy in 10 fractions   2) Pelvis/ 30 Gy in 10 fractions  Beams/energy:  1) 3D/ 10X   2) 3D/ 15 X  Narrative: The patient tolerated radiation treatment relatively well. During treatment, the patient complained of 4/10 pain to the treatment area and mild fatigue. She began immunotherapy on 12/03/17.  Plan: The patient has completed radiation treatment. The patient will return to radiation oncology clinic for routine followup in one month. I advised them to call or return sooner if they have any questions or concerns related to their recovery or treatment.  ------------------------------------------------  Jodelle Gross, MD, PhD  This document serves as a record of services personally performed by Kyung Rudd, MD. It was created on his behalf by Bethann Humble, a trained medical scribe. The creation of this record is based on the scribe's personal observations and the provider's statements to them. This document has been checked and approved by the attending provider.

## 2017-12-09 NOTE — Assessment & Plan Note (Signed)
Right breast invasive ductal carcinoma multifocal, multicentric disease with at least 8 tumors, 2.3 cm, 1.6 cm of the biggest tumors in addition 6 more tumors 1 cm or less spanning 13.7 cm, right axillary lymph node biopsy positive (ER 22%); primary tumor is ER 0%, PR 0%, HER-2 negative, Ki-67 77% and 73% respectively, grade 3 T3 N1 M0 equals stage IIIa, Grade 3 1. Rt mastectomy 07/28/15: Multifocal IDC 2 cm, 1.5 cm. 0.6 cm; 3/13 LN positive, T1c N1 M0 stage IIA, ER/PR HER-2 negative. 2. Adjuvant XRT 09/03/15 to 10/13/15 3. Adjuvant Xeloda X 6 months (8 cycles) started 11/15/2015 completed July 2017 4. Carboplatin and gemcitabine started 03/14/2017 5. Halaven day 1 day 8 every 3 weeks started 09/12/2017-10/31/17 6.  Palliative radiation therapy to we will were lesion as well as subcutaneous lesions in the back ---------------------------------------------------------------------------- Treatment plan: Pembrolizumab immunotherapy: Today is cycle 1 day 8 Toxicities:  Hypokalemia: Monitoring potassium closely Goals of care: Palliation  Return to clinic in 2 weeks for cycle 2     

## 2017-12-10 ENCOUNTER — Inpatient Hospital Stay: Payer: 59

## 2017-12-10 ENCOUNTER — Encounter: Payer: Self-pay | Admitting: Pharmacy Technician

## 2017-12-10 ENCOUNTER — Other Ambulatory Visit: Payer: Self-pay

## 2017-12-10 ENCOUNTER — Ambulatory Visit (HOSPITAL_COMMUNITY)
Admission: RE | Admit: 2017-12-10 | Discharge: 2017-12-10 | Disposition: A | Discharge status: Home / Self Care | Payer: 59 | Source: Ambulatory Visit | Attending: Hematology and Oncology | Admitting: Hematology and Oncology

## 2017-12-10 ENCOUNTER — Other Ambulatory Visit: Payer: Self-pay | Admitting: Hematology and Oncology

## 2017-12-10 ENCOUNTER — Telehealth: Payer: Self-pay

## 2017-12-10 ENCOUNTER — Telehealth: Payer: Self-pay | Admitting: Hematology and Oncology

## 2017-12-10 VITALS — BP 133/83 | HR 127 | Temp 100.0°F | Resp 26

## 2017-12-10 DIAGNOSIS — J90 Pleural effusion, not elsewhere classified: Secondary | ICD-10-CM | POA: Diagnosis not present

## 2017-12-10 DIAGNOSIS — R918 Other nonspecific abnormal finding of lung field: Secondary | ICD-10-CM | POA: Diagnosis not present

## 2017-12-10 DIAGNOSIS — Z171 Estrogen receptor negative status [ER-]: Secondary | ICD-10-CM | POA: Diagnosis not present

## 2017-12-10 DIAGNOSIS — T451X5A Adverse effect of antineoplastic and immunosuppressive drugs, initial encounter: Principal | ICD-10-CM

## 2017-12-10 DIAGNOSIS — G620 Drug-induced polyneuropathy: Secondary | ICD-10-CM

## 2017-12-10 DIAGNOSIS — Z95828 Presence of other vascular implants and grafts: Secondary | ICD-10-CM

## 2017-12-10 DIAGNOSIS — C50411 Malignant neoplasm of upper-outer quadrant of right female breast: Secondary | ICD-10-CM

## 2017-12-10 DIAGNOSIS — R509 Fever, unspecified: Secondary | ICD-10-CM | POA: Diagnosis not present

## 2017-12-10 DIAGNOSIS — Z5112 Encounter for antineoplastic immunotherapy: Secondary | ICD-10-CM | POA: Diagnosis not present

## 2017-12-10 LAB — URINALYSIS, COMPLETE (UACMP) WITH MICROSCOPIC
BILIRUBIN URINE: NEGATIVE
GLUCOSE, UA: NEGATIVE mg/dL
HGB URINE DIPSTICK: NEGATIVE
Ketones, ur: NEGATIVE mg/dL
LEUKOCYTES UA: NEGATIVE
NITRITE: NEGATIVE
PH: 5 (ref 5.0–8.0)
Protein, ur: 30 mg/dL — AB
Specific Gravity, Urine: 1.012 (ref 1.005–1.030)

## 2017-12-10 LAB — INFLUENZA PANEL BY PCR (TYPE A & B)
Influenza A By PCR: NEGATIVE
Influenza B By PCR: NEGATIVE

## 2017-12-10 MED ORDER — ACETAMINOPHEN 325 MG PO TABS
ORAL_TABLET | ORAL | Status: AC
  Filled 2017-12-10: qty 2

## 2017-12-10 MED ORDER — LEVOFLOXACIN 750 MG PO TABS: 750.0000 mg | ORAL_TABLET | Freq: Every day | ORAL | 0 refills | Status: DC

## 2017-12-10 MED ORDER — SODIUM CHLORIDE 0.9 % IV SOLN
Freq: Once | INTRAVENOUS | Status: AC
  Administered 2017-12-10: 14:00:00 via INTRAVENOUS

## 2017-12-10 MED ORDER — HEPARIN SOD (PORK) LOCK FLUSH 100 UNIT/ML IV SOLN
500.0000 [IU] | Freq: Once | INTRAVENOUS | Status: AC | PRN
  Administered 2017-12-10: 500 [IU] via INTRAVENOUS
  Filled 2017-12-10: qty 5

## 2017-12-10 MED ORDER — SODIUM CHLORIDE 0.9% FLUSH
10.0000 mL | INTRAVENOUS | Status: DC | PRN
  Administered 2017-12-10: 10 mL via INTRAVENOUS
  Filled 2017-12-10: qty 10

## 2017-12-10 MED ORDER — ACETAMINOPHEN 325 MG PO TABS
650.0000 mg | ORAL_TABLET | Freq: Once | ORAL | Status: AC
  Administered 2017-12-10: 650 mg via ORAL

## 2017-12-10 NOTE — Progress Notes (Signed)
The patient is approved for drug assistance by [employer] for [employer]. The enrollment is from 12/10/17 - 12/10/18 and is based on Off Label Use. Drug Assistance will cover DOS beginning 12/03/17 and will cover subsequent treatments.

## 2017-12-10 NOTE — Telephone Encounter (Signed)
Patient is here today for IV fluids. Infusion RN called with patient symptoms of temp 100.3, HR 136, chills, and cough. Dr. [name] instructed for patient to have 650mg  tylenol, UA, blood cultures, and chest xray. Infusion RN called and informed us that patient is refusing chest xray. Reasoning is unclear and pt was encouraged as to why she should have this completed but she still declines.  Cyndia Bent RN

## 2017-12-10 NOTE — Telephone Encounter (Signed)
Spoke to patient regarding upcoming February and march appointments. Patient cancelled upcoming appointment 2/28 because of a procedure she has scheduled at the same time.

## 2017-12-10 NOTE — Patient Instructions (Signed)
Dehydration, Adult Dehydration is when there is not enough fluid or water in your body. This happens when you lose more fluids than you take in. Dehydration can range from mild to very bad. It should be treated right away to keep it from getting very bad. Symptoms of mild dehydration may include:  Thirst.  Dry lips.  Slightly dry mouth.  Dry, warm skin.  Dizziness. Symptoms of moderate dehydration may include:  Very dry mouth.  Muscle cramps.  Dark pee (urine). Pee may be the color of tea.  Your body making less pee.  Your eyes making fewer tears.  Heartbeat that is uneven or faster than normal (palpitations).  Headache.  Light-headedness, especially when you stand up from sitting.  Fainting (syncope). Symptoms of very bad dehydration may include:  Changes in skin, such as: ? Cold and clammy skin. ? Blotchy (mottled) or pale skin. ? Skin that does not quickly return to normal after being lightly pinched and let go (poor skin turgor).  Changes in body fluids, such as: ? Feeling very thirsty. ? Your eyes making fewer tears. ? Not sweating when body temperature is high, such as in hot weather. ? Your body making very little pee.  Changes in vital signs, such as: ? Weak pulse. ? Pulse that is more than 100 beats a minute when you are sitting still. ? Fast breathing. ? Low blood pressure.  Other changes, such as: ? Sunken eyes. ? Cold hands and feet. ? Confusion. ? Lack of energy (lethargy). ? Trouble waking up from sleep. ? Short-term weight loss. ? Unconsciousness. Follow these instructions at home:  If told by your doctor, drink an ORS: ? Make an ORS by using instructions on the package. ? Start by drinking small amounts, about  cup (120 mL) every 5-10 minutes. ? Slowly drink more until you have had the amount that your doctor said to have.  Drink enough clear fluid to keep your pee clear or pale yellow. If you were told to drink an ORS, finish the ORS  first, then start slowly drinking clear fluids. Drink fluids such as: ? Water. Do not drink only water by itself. Doing that can make the salt (sodium) level in your body get too low (hyponatremia). ? Ice chips. ? Fruit juice that you have added water to (diluted). ? Low-calorie sports drinks.  Avoid: ? Alcohol. ? Drinks that have a lot of sugar. These include high-calorie sports drinks, fruit juice that does not have water added, and soda. ? Caffeine. ? Foods that are greasy or have a lot of fat or sugar.  Take over-the-counter and prescription medicines only as told by your doctor.  Do not take salt tablets. Doing that can make the salt level in your body get too high (hypernatremia).  Eat foods that have minerals (electrolytes). Examples include bananas, oranges, potatoes, tomatoes, and spinach.  Keep all follow-up visits as told by your doctor. This is important. Contact a doctor if:  You have belly (abdominal) pain that: ? Gets worse. ? Stays in one area (localizes).  You have a rash.  You have a stiff neck.  You get angry or annoyed more easily than normal (irritability).  You are more sleepy than normal.  You have a harder time waking up than normal.  You feel: ? Weak. ? Dizzy. ? Very thirsty.  You have peed (urinated) only a small amount of very dark pee during 6-8 hours. Get help right away if:  You have symptoms of   very bad dehydration.  You cannot drink fluids without throwing up (vomiting).  Your symptoms get worse with treatment.  You have a fever.  You have a very bad headache.  You are throwing up or having watery poop (diarrhea) and it: ? Gets worse. ? Does not go away.  You have blood or something green (bile) in your throw-up.  You have blood in your poop (stool). This may cause poop to look black and tarry.  You have not peed in 6-8 hours.  You pass out (faint).  Your heart rate when you are sitting still is more than 100 beats a  minute.  You have trouble breathing. This information is not intended to replace advice given to you by your health care provider. Make sure you discuss any questions you have with your health care provider. Document Released: 07/27/2009 Document Revised: 04/19/2016 Document Reviewed: 11/24/2015 Elsevier Interactive Patient Education  2018 [employer] Inc.  

## 2017-12-10 NOTE — Progress Notes (Signed)
Assumed care of this patient while IVF's infusing. Patient found to have persistent tachycardia and tachypnea and borderline febrile. Dry cough noted. Patient states she has had the cough since Saturday. Denies myalgia/arthralgia. States her son currently has the flu. Call Ellwood Dense, RN regarding additional findings. Tiffany discussed scenario with Dr. [name] and influenza swab ordered, obtained, and taken to lab. Urine specimen collected and taken to lab, also. After IVF's infused, patient escorted to radiology department for CXR. Patient instructed to call office or return to ED for evaluation if symptoms persist or worsen. Verbalized understanding.

## 2017-12-10 NOTE — Progress Notes (Signed)
Patient had low-grade temperature, tachycardia and shortness of breath. Chest x-ray suggested an infiltrate in the right lung as well as moderate pleural effusion. Influenza test is pending. I suspect the patient may be developing an infection. I sent a prescription for Levaquin 750 mg. We will follow-up on the results of blood cultures and influenza test. I informed the patient of the chest x-ray results.

## 2017-12-11 ENCOUNTER — Ambulatory Visit: Payer: 59

## 2017-12-11 ENCOUNTER — Telehealth: Payer: Self-pay

## 2017-12-11 NOTE — Telephone Encounter (Signed)
Called pt to inform her she was negative for the flu. Dr. [name] spoke with her yesterday afternoon regarding chest xray and sent in a script for her for Levaquin. She stated she is picking it up this am.  Cyndia Bent RN

## 2017-12-12 ENCOUNTER — Telehealth: Payer: Self-pay

## 2017-12-12 NOTE — Telephone Encounter (Signed)
Received VM from patient regarding that she prefers not to come tomorrow (12/13/2017) for IV fluids, she has cancelled because she is feeling better and drinking well now and doesn't feel she needs the appt.  Called pt back, no answer, left her VM.  Explained to her that I would leave her in that spot until 5 pm just in case she wants the IVFs afterall, if that's the case then she can call us back, but after 5 pm, I will cancel the appt for tomorrow if I haven't heard back from her. Will inbasket Dr [name] to make him aware.

## 2017-12-13 ENCOUNTER — Telehealth: Payer: Self-pay | Admitting: *Deleted

## 2017-12-13 ENCOUNTER — Ambulatory Visit: Payer: 59

## 2017-12-13 NOTE — Telephone Encounter (Signed)
FYI No Show for today's IVF appointment scheduled 9:00 am.  Most recent receipt of IVF at [facility] was 12-10-2017.

## 2017-12-15 LAB — CULTURE, BLOOD (SINGLE)
Culture: NO GROWTH
Special Requests: ADEQUATE

## 2017-12-17 ENCOUNTER — Telehealth: Payer: Self-pay | Admitting: *Deleted

## 2017-12-17 ENCOUNTER — Other Ambulatory Visit: Payer: Self-pay | Admitting: *Deleted

## 2017-12-17 DIAGNOSIS — C50411 Malignant neoplasm of upper-outer quadrant of right female breast: Secondary | ICD-10-CM

## 2017-12-17 DIAGNOSIS — Z171 Estrogen receptor negative status [ER-]: Principal | ICD-10-CM

## 2017-12-17 DIAGNOSIS — R0602 Shortness of breath: Secondary | ICD-10-CM

## 2017-12-17 NOTE — Telephone Encounter (Signed)
Received call from patient stating is having shortness of breath, cough, increased HR.  Informed her that she could have an increase in her pleural effusion.  Per Mendel Ryder patient should go to ED.  Informed her to go to ED to be evaluated.  She verbalized understanding.  Patient called me back and stated she does not want to go to the ED, she stated that they didn't do anything for her last week when she was there and would rather come in tomorrow and see someone here. Instructed her and her husband if symptoms worsen tonight to please call 911 or go to the ED.    Scheduled CT angio and thoracentesis tomorrow 3/7 at 945am.  Patient will also come here to have labs and see Sandi Mealy, PA at 1230 and 1pm.  Patient is aware of these appointments.

## 2017-12-18 ENCOUNTER — Encounter: Payer: Self-pay | Admitting: Medical

## 2017-12-18 ENCOUNTER — Inpatient Hospital Stay: Payer: 59

## 2017-12-18 ENCOUNTER — Ambulatory Visit (HOSPITAL_COMMUNITY)
Admission: RE | Admit: 2017-12-18 | Discharge: 2017-12-18 | Disposition: A | Discharge status: Home / Self Care | Payer: 59 | Source: Ambulatory Visit | Attending: Hematology and Oncology | Admitting: Hematology and Oncology

## 2017-12-18 ENCOUNTER — Inpatient Hospital Stay: Payer: 59 | Attending: Hematology and Oncology | Admitting: Medical

## 2017-12-18 VITALS — BP 132/87 | HR 160 | Temp 99.1°F | Resp 20 | Ht 65.0 in | Wt 138.9 lb

## 2017-12-18 DIAGNOSIS — R000 Tachycardia, unspecified: Secondary | ICD-10-CM

## 2017-12-18 DIAGNOSIS — J90 Pleural effusion, not elsewhere classified: Secondary | ICD-10-CM | POA: Diagnosis not present

## 2017-12-18 DIAGNOSIS — Z95828 Presence of other vascular implants and grafts: Secondary | ICD-10-CM

## 2017-12-18 DIAGNOSIS — K838 Other specified diseases of biliary tract: Secondary | ICD-10-CM | POA: Insufficient documentation

## 2017-12-18 DIAGNOSIS — Z171 Estrogen receptor negative status [ER-]: Principal | ICD-10-CM

## 2017-12-18 DIAGNOSIS — C50411 Malignant neoplasm of upper-outer quadrant of right female breast: Secondary | ICD-10-CM | POA: Diagnosis present

## 2017-12-18 DIAGNOSIS — R0902 Hypoxemia: Secondary | ICD-10-CM | POA: Diagnosis not present

## 2017-12-18 DIAGNOSIS — C7951 Secondary malignant neoplasm of bone: Secondary | ICD-10-CM | POA: Insufficient documentation

## 2017-12-18 DIAGNOSIS — C773 Secondary and unspecified malignant neoplasm of axilla and upper limb lymph nodes: Secondary | ICD-10-CM

## 2017-12-18 DIAGNOSIS — R0602 Shortness of breath: Secondary | ICD-10-CM

## 2017-12-18 DIAGNOSIS — Z9981 Dependence on supplemental oxygen: Secondary | ICD-10-CM

## 2017-12-18 DIAGNOSIS — Z5112 Encounter for antineoplastic immunotherapy: Secondary | ICD-10-CM | POA: Insufficient documentation

## 2017-12-18 DIAGNOSIS — C50919 Malignant neoplasm of unspecified site of unspecified female breast: Secondary | ICD-10-CM | POA: Diagnosis not present

## 2017-12-18 DIAGNOSIS — Z923 Personal history of irradiation: Secondary | ICD-10-CM | POA: Insufficient documentation

## 2017-12-18 DIAGNOSIS — J189 Pneumonia, unspecified organism: Secondary | ICD-10-CM | POA: Diagnosis not present

## 2017-12-18 DIAGNOSIS — R590 Localized enlarged lymph nodes: Secondary | ICD-10-CM | POA: Insufficient documentation

## 2017-12-18 DIAGNOSIS — C787 Secondary malignant neoplasm of liver and intrahepatic bile duct: Secondary | ICD-10-CM | POA: Diagnosis not present

## 2017-12-18 LAB — CBC WITH DIFFERENTIAL (CANCER CENTER ONLY)
BASOS PCT: 0 %
Basophils Absolute: 0 10*3/uL (ref 0.0–0.1)
EOS ABS: 0 10*3/uL (ref 0.0–0.5)
EOS PCT: 0 %
HCT: 36.8 % (ref 34.8–46.6)
HEMOGLOBIN: 11.8 g/dL (ref 11.6–15.9)
Lymphocytes Relative: 3 %
Lymphs Abs: 0.3 10*3/uL — ABNORMAL LOW (ref 0.9–3.3)
MCH: 26.5 pg (ref 25.1–34.0)
MCHC: 32.2 g/dL (ref 31.5–36.0)
MCV: 82.3 fL (ref 79.5–101.0)
MONO ABS: 0.5 10*3/uL (ref 0.1–0.9)
MONOS PCT: 4 %
NEUTROS PCT: 93 %
Neutro Abs: 11.8 10*3/uL — ABNORMAL HIGH (ref 1.5–6.5)
PLATELETS: 241 10*3/uL (ref 145–400)
RBC: 4.47 MIL/uL (ref 3.70–5.45)
RDW: 20.9 % — AB (ref 11.2–14.5)
WBC Count: 12.6 10*3/uL — ABNORMAL HIGH (ref 3.9–10.3)

## 2017-12-18 LAB — CMP (CANCER CENTER ONLY)
ALK PHOS: 333 U/L — AB (ref 40–150)
ALT: 25 U/L (ref 0–55)
AST: 33 U/L (ref 5–34)
Albumin: 2.4 g/dL — ABNORMAL LOW (ref 3.5–5.0)
Anion gap: 12 — ABNORMAL HIGH (ref 3–11)
BUN: 9 mg/dL (ref 7–26)
CALCIUM: 9.3 mg/dL (ref 8.4–10.4)
CO2: 23 mmol/L (ref 22–29)
CREATININE: 0.73 mg/dL (ref 0.60–1.10)
Chloride: 104 mmol/L (ref 98–109)
GFR, Estimated: >60 mL/min (ref >60)
Glucose, Bld: 140 mg/dL (ref 70–140)
Potassium: 3.7 mmol/L (ref 3.5–5.1)
SODIUM: 139 mmol/L (ref 136–145)
Total Bilirubin: 0.7 mg/dL (ref 0.2–1.2)
Total Protein: 7.1 g/dL (ref 6.4–8.3)

## 2017-12-18 LAB — BODY FLUID CELL COUNT WITH DIFFERENTIAL
Eos, Fluid: 0 %
LYMPHS FL: 34 %
Monocyte-Macrophage-Serous Fluid: 40 % — ABNORMAL LOW (ref 50–90)
Neutrophil Count, Fluid: 25 % (ref 0–25)
Other Cells, Fluid: REACTIVE %
Total Nucleated Cell Count, Fluid: 271 cu mm (ref 0–1000)

## 2017-12-18 MED ORDER — SODIUM CHLORIDE 0.9% FLUSH
10.0000 mL | Freq: Once | INTRAVENOUS | Status: AC
  Administered 2017-12-18: 10 mL via INTRAVENOUS
  Filled 2017-12-18: qty 10

## 2017-12-18 MED ORDER — HEPARIN SOD (PORK) LOCK FLUSH 100 UNIT/ML IV SOLN
500.0000 [IU] | Freq: Once | INTRAVENOUS | Status: AC
  Administered 2017-12-18: 500 [IU] via INTRAVENOUS

## 2017-12-18 MED ORDER — SODIUM CHLORIDE 0.9 % IJ SOLN
INTRAMUSCULAR | Status: AC
  Filled 2017-12-18: qty 50

## 2017-12-18 MED ORDER — IOPAMIDOL (ISOVUE-370) INJECTION 76%
100.0000 mL | Freq: Once | INTRAVENOUS | Status: AC | PRN
  Administered 2017-12-18: 62 mL via INTRAVENOUS

## 2017-12-18 MED ORDER — HEPARIN SOD (PORK) LOCK FLUSH 100 UNIT/ML IV SOLN
500.0000 [IU] | Freq: Once | INTRAVENOUS | Status: AC
  Administered 2017-12-18: 500 [IU] via INTRAVENOUS
  Filled 2017-12-18: qty 5

## 2017-12-18 MED ORDER — IOPAMIDOL (ISOVUE-370) INJECTION 76%
INTRAVENOUS | Status: AC
  Administered 2017-12-18: 100 mL
  Filled 2017-12-18: qty 100

## 2017-12-18 MED ORDER — LIDOCAINE HCL 1 % IJ SOLN
INTRAMUSCULAR | Status: AC
  Filled 2017-12-18: qty 10

## 2017-12-18 MED ORDER — SODIUM CHLORIDE 0.9 % IV SOLN
Freq: Once | INTRAVENOUS | Status: AC
  Administered 2017-12-18: 15:00:00 via INTRAVENOUS

## 2017-12-18 MED ORDER — AMOXICILLIN-POT CLAVULANATE 875-125 MG PO TABS: 1.0000 | ORAL_TABLET | Freq: Two times a day (BID) | ORAL | 0 refills | Status: DC

## 2017-12-18 MED ORDER — HEPARIN SOD (PORK) LOCK FLUSH 100 UNIT/ML IV SOLN
INTRAVENOUS | Status: DC
Start: 2017-12-18 — End: 2017-12-19
  Filled 2017-12-18: qty 5

## 2017-12-18 NOTE — Progress Notes (Signed)
Okay to discharge post IVF per Sandi Mealy, PA. Pt desat upon walking to 87% SpO2 and HR elevated to 175 at maximum. Plan for home O2 to be ordered.

## 2017-12-18 NOTE — Patient Instructions (Addendum)
Dehydration, Adult Dehydration is when there is not enough fluid or water in your body. This happens when you lose more fluids than you take in. Dehydration can range from mild to very bad. It should be treated right away to keep it from getting very bad. Symptoms of mild dehydration may include:  Thirst.  Dry lips.  Slightly dry mouth.  Dry, warm skin.  Dizziness. Symptoms of moderate dehydration may include:  Very dry mouth.  Muscle cramps.  Dark pee (urine). Pee may be the color of tea.  Your body making less pee.  Your eyes making fewer tears.  Heartbeat that is uneven or faster than normal (palpitations).  Headache.  Light-headedness, especially when you stand up from sitting.  Fainting (syncope). Symptoms of very bad dehydration may include:  Changes in skin, such as: ? Cold and clammy skin. ? Blotchy (mottled) or pale skin. ? Skin that does not quickly return to normal after being lightly pinched and let go (poor skin turgor).  Changes in body fluids, such as: ? Feeling very thirsty. ? Your eyes making fewer tears. ? Not sweating when body temperature is high, such as in hot weather. ? Your body making very little pee.  Changes in vital signs, such as: ? Weak pulse. ? Pulse that is more than 100 beats a minute when you are sitting still. ? Fast breathing. ? Low blood pressure.  Other changes, such as: ? Sunken eyes. ? Cold hands and feet. ? Confusion. ? Lack of energy (lethargy). ? Trouble waking up from sleep. ? Short-term weight loss. ? Unconsciousness. Follow these instructions at home:  If told by your doctor, drink an ORS: ? Make an ORS by using instructions on the package. ? Start by drinking small amounts, about  cup (120 mL) every 5-10 minutes. ? Slowly drink more until you have had the amount that your doctor said to have.  Drink enough clear fluid to keep your pee clear or pale yellow. If you were told to drink an ORS, finish the ORS  first, then start slowly drinking clear fluids. Drink fluids such as: ? Water. Do not drink only water by itself. Doing that can make the salt (sodium) level in your body get too low (hyponatremia). ? Ice chips. ? Fruit juice that you have added water to (diluted). ? Low-calorie sports drinks.  Avoid: ? Alcohol. ? Drinks that have a lot of sugar. These include high-calorie sports drinks, fruit juice that does not have water added, and soda. ? Caffeine. ? Foods that are greasy or have a lot of fat or sugar.  Take over-the-counter and prescription medicines only as told by your doctor.  Do not take salt tablets. Doing that can make the salt level in your body get too high (hypernatremia).  Eat foods that have minerals (electrolytes). Examples include bananas, oranges, potatoes, tomatoes, and spinach.  Keep all follow-up visits as told by your doctor. This is important. Contact a doctor if:  You have belly (abdominal) pain that: ? Gets worse. ? Stays in one area (localizes).  You have a rash.  You have a stiff neck.  You get angry or annoyed more easily than normal (irritability).  You are more sleepy than normal.  You have a harder time waking up than normal.  You feel: ? Weak. ? Dizzy. ? Very thirsty.  You have peed (urinated) only a small amount of very dark pee during 6-8 hours. Get help right away if:  You have symptoms of   very bad dehydration.  You cannot drink fluids without throwing up (vomiting).  Your symptoms get worse with treatment.  You have a fever.  You have a very bad headache.  You are throwing up or having watery poop (diarrhea) and it: ? Gets worse. ? Does not go away.  You have blood or something green (bile) in your throw-up.  You have blood in your poop (stool). This may cause poop to look black and tarry.  You have not peed in 6-8 hours.  You pass out (faint).  Your heart rate when you are sitting still is more than 100 beats a  minute.  You have trouble breathing. This information is not intended to replace advice given to you by your health care provider. Make sure you discuss any questions you have with your health care provider. Document Released: 07/27/2009 Document Revised: 04/19/2016 Document Reviewed: 11/24/2015 [employer] Interactive Patient Education  2018 [employer]  Dehydration, Adult Dehydration is when there is not enough fluid or water in your body. This happens when you lose more fluids than you take in. Dehydration can range from mild to very bad. It should be treated right away to keep it from getting very bad. Symptoms of mild dehydration may include:  Thirst.  Dry lips.  Slightly dry mouth.  Dry, warm skin.  Dizziness. Symptoms of moderate dehydration may include:  Very dry mouth.  Muscle cramps.  Dark pee (urine). Pee may be the color of tea.  Your body making less pee.  Your eyes making fewer tears.  Heartbeat that is uneven or faster than normal (palpitations).  Headache.  Light-headedness, especially when you stand up from sitting.  Fainting (syncope). Symptoms of very bad dehydration may include:  Changes in skin, such as: ? Cold and clammy skin. ? Blotchy (mottled) or pale skin. ? Skin that does not quickly return to normal after being lightly pinched and let go (poor skin turgor).  Changes in body fluids, such as: ? Feeling very thirsty. ? Your eyes making fewer tears. ? Not sweating when body temperature is high, such as in hot weather. ? Your body making very little pee.  Changes in vital signs, such as: ? Weak pulse. ? Pulse that is more than 100 beats a minute when you are sitting still. ? Fast breathing. ? Low blood pressure.  Other changes, such as: ? Sunken eyes. ? Cold hands and feet. ? Confusion. ? Lack of energy (lethargy). ? Trouble waking up from sleep. ? Short-term weight loss. ? Unconsciousness. Follow these instructions at  home:  If told by your doctor, drink an ORS: ? Make an ORS by using instructions on the package. ? Start by drinking small amounts, about  cup (120 mL) every 5-10 minutes. ? Slowly drink more until you have had the amount that your doctor said to have.  Drink enough clear fluid to keep your pee clear or pale yellow. If you were told to drink an ORS, finish the ORS first, then start slowly drinking clear fluids. Drink fluids such as: ? Water. Do not drink only water by itself. Doing that can make the salt (sodium) level in your body get too low (hyponatremia). ? Ice chips. ? Fruit juice that you have added water to (diluted). ? Low-calorie sports drinks.  Avoid: ? Alcohol. ? Drinks that have a lot of sugar. These include high-calorie sports drinks, fruit juice that does not have water added, and soda. ? Caffeine. ? Foods that are greasy or have   a lot of fat or sugar.  Take over-the-counter and prescription medicines only as told by your doctor.  Do not take salt tablets. Doing that can make the salt level in your body get too high (hypernatremia).  Eat foods that have minerals (electrolytes). Examples include bananas, oranges, potatoes, tomatoes, and spinach.  Keep all follow-up visits as told by your doctor. This is important. Contact a doctor if:  You have belly (abdominal) pain that: ? Gets worse. ? Stays in one area (localizes).  You have a rash.  You have a stiff neck.  You get angry or annoyed more easily than normal (irritability).  You are more sleepy than normal.  You have a harder time waking up than normal.  You feel: ? Weak. ? Dizzy. ? Very thirsty.  You have peed (urinated) only a small amount of very dark pee during 6-8 hours. Get help right away if:  You have symptoms of very bad dehydration.  You cannot drink fluids without throwing up (vomiting).  Your symptoms get worse with treatment.  You have a fever.  You have a very bad headache.  You  are throwing up or having watery poop (diarrhea) and it: ? Gets worse. ? Does not go away.  You have blood or something green (bile) in your throw-up.  You have blood in your poop (stool). This may cause poop to look black and tarry.  You have not peed in 6-8 hours.  You pass out (faint).  Your heart rate when you are sitting still is more than 100 beats a minute.  You have trouble breathing. This information is not intended to replace advice given to you by your health care provider. Make sure you discuss any questions you have with your health care provider. Document Released: 07/27/2009 Document Revised: 04/19/2016 Document Reviewed: 11/24/2015 [employer] Interactive Patient Education  2018 [employer]  

## 2017-12-18 NOTE — Procedures (Addendum)
Ultrasound-guided diagnostic and therapeutic left thoracentesis performed yielding 1.8 liters of bloody fluid. No immediate complications.  Scheduled for follow-up CT angio chest afterwards.  Due to persistent pt coughing/chest discomfort only the above amount of fluid was removed today. A portion of the fluid was sent to the lab for preordered studies.

## 2017-12-18 NOTE — Progress Notes (Addendum)
[facility] Progress Note   Phyllis Pruitt [id number] 1971-08-02 47 y.o.  [name] is managed by Dr. Nicholas Lose  Actively treated with chemotherapy/monoclonal antibody: yes  Current Therapy: pembrolizumab   Assessment: Plan:    Port-A-Cath in place - Plan: sodium chloride flush (NS) 0.9 % injection 10 mL, sodium chloride flush (NS) 0.9 % injection 10 mL, heparin lock flush 100 unit/mL  Tachycardia - Plan: 0.9 %  sodium chloride infusion  Pneumonia due to infectious organism, unspecified laterality, unspecified part of lung - Plan: amoxicillin-clavulanate (AUGMENTIN) 875-125 MG tablet  Pleural effusion  Hypoxia - Plan: For home use only DME oxygen   Tachycardia with recent treatment for pneumonia: Review of the patient's labs from today show that her white blood count is elevated at 12.6 with an [place] of 11.8.  Given this along with her tachycardia and productive cough, she has been given a prescription for Augmentin 875-125 p.o. twice daily times 7 days.  Additionally she is being given IV fluids and received normal saline 1 L over 2 hours.  Left pleural effusion: The patient is status post a therapeutic and diagnostic thoracentesis which was completed earlier today.  1.8 L of bloody fluid were obtained.  This is being submitted for cytology.  Hypoxia: Home O2 with a portable oxygen tank has been requested for this patient.  Room air at rest:    90 % Room air with ambulation:   87 % At rest with oxygen at 2LPM via an [place]: 94 %  Please see After Visit Summary for patient specific instructions.  Future Appointments  Date Time Provider [place]  12/24/2017  2:00 PM [facility] LAB 3 [facility] None  12/24/2017  2:30 PM Nicholas Lose, MD [facility] None  12/24/2017  3:30 PM [facility] F21 [facility] None  12/30/2017  1:30 PM Hayden Pedro, PA-C [facility] None  01/06/2018  9:30 AM [facility] LAB 2 [facility] None  01/06/2018 10:00 AM  Nicholas Lose, MD [facility] None  01/14/2018  1:30 PM [facility] LAB 5 [facility] None  01/14/2018  2:00 PM Nicholas Lose, MD [facility] None  01/14/2018  2:30 PM [facility] B6 [facility] None    Orders Placed This Encounter  Procedures  . For home use only DME oxygen       Subjective:   Patient ID:  Phyllis Pruitt is a 47 y.o. (DOB 1971-01-10) female.  Chief Complaint:  Chief Complaint  Patient presents with  . Shortness of Breath    HPI Phyllis Pruitt is a 47 year old female with a triple negative metastatic breast cancer who is currently treated with pembrolizumab under the care of Dr. Nicholas Lose. She has a history of a pleural effusion. She contacted our office yesterday afternoon with a report of shortness of breath, tachycardia, and a cough. She was told that she should go to the ER. The patient declined to go to the ER. She was taken for a left ultrasound guided thoracentesis today with 1.8 liters of bloody fluid removed. This was sent for cytology. Additionally she was taken for a CT angiogram which showed no evidence of a pneumothorax.  There was a considerable amount of parenchymal edema which was suspected to be related to reexpansion edema.  Persistent sub-pectoral and peri-implants as well as right internal mammary adenopathy were seen.  Hepatic metastasis had increased in size.  There was also noted to be persistent left biliary ductal dilatation consistent with a central obstructing lesion.  Stable diffuse osseous metastatic disease was  noted.  She was recently treated for pneumonia and completed 7 days of Levaquin as of yesterday.  Her labs from today show a white blood count of 12.6 with an ANC of 11.8.  She continues to be markedly short of breath.  She has a productive cough with yellowish sputum.  Her physical exam today shows that she is tachycardic at 160 bpm.  She denies chest pain or chest tightness. She will be celebrating her 5th birthday on Sunday, March [birth year].    Medications: I have reviewed the patient's current medications.  Allergies:  Allergies  Allergen Reactions  . Sulfa Antibiotics Itching    Vaginal bleeding    Past Medical History:  Diagnosis Date  . Anemia    during pregnancy  . Breast cancer of upper-outer quadrant of right female breast ([place]) 01/27/2015  . Depression    hx denies any problems now  . History of kidney cancer 12/2015   left removed "spot"  . Lymphedema    rt arm  . Neuropathy   . Sickle cell trait [facility])     Past Surgical History:  Procedure Laterality Date  . LATISSIMUS FLAP TO BREAST Right 06/03/2016   Procedure: RIGHT LATISSIMUS FLAP TO BREAST;  Surgeon: Irene Limbo, MD;  Location: [place];  Service: Plastics;  Laterality: Right;  . LIPOSUCTION WITH LIPOFILLING Right 09/20/2016   Procedure: LIPOSUCTION WITH LIPOFILLING;  Surgeon: Irene Limbo, MD;  Location: [place];  Service: Plastics;  Laterality: Right;  LIPOSUCTION WITH LIPOFILLING  . MASTECTOMY    . MASTECTOMY MODIFIED RADICAL Right 07/28/2015  . MASTECTOMY MODIFIED RADICAL Right 07/28/2015   Procedure: RIGHT MODIFIED RADICAL MASTECTOMY;  Surgeon: Alphonsa Overall, MD;  Location: [place];  Service: General;  Laterality: Right;  . PORT-A-CATH REMOVAL  07/28/2015  . PORT-A-CATH REMOVAL  07/28/2015   Procedure: REMOVAL PORT-A-CATH;  Surgeon: Alphonsa Overall, MD;  Location: [place];  Service: General;;  . PORTACATH PLACEMENT N/A 02/10/2015   Procedure: INSERTION PORT-A-CATH WITH ULTRA SOUND, left subclavian,;  Surgeon: Alphonsa Overall, MD;  Location[facility];  Service: General;  Laterality: N/A;  . PORTACATH PLACEMENT N/A 04/04/2017   Procedure: POWER PORT PLACEMENT;  Surgeon: Alphonsa Overall, MD;  Location: [facility];  Service: General;  Laterality: N/A;  . REMOVAL OF TISSUE EXPANDER AND PLACEMENT OF IMPLANT Right 09/20/2016   Procedure: REMOVAL OF RIGHT TISSUE EXPANDER WITH PLACEMENT OF RIGHT SILICONE BREAST IMPLANTS LIPOFILLING FROM ABDOMEN TO  RIGHT CHEST;  Surgeon: Irene Limbo, MD;  Location: [place];  Service: Plastics;  Laterality: Right;  PLACEMENT OF RIGHT SILICONE BREAST IMPLANTS LIPOFILLING FROM ABDOMEN TO RIGHT CHEST  . ROBOTIC ASSITED PARTIAL NEPHRECTOMY Left 12/29/2015   Procedure: XI ROBOTIC ASSITED PARTIAL NEPHRECTOMY;  Surgeon: Cleon Gustin, MD;  Location: [facility];  Service: Urology;  Laterality: Left;  . TISSUE EXPANDER PLACEMENT Right 06/03/2016   Procedure: PLACEMENT RIGHT TISSUE EXPANDER;  Surgeon: Irene Limbo, MD;  Location: [place];  Service: Plastics;  Laterality: Right;  . TONSILLECTOMY    . TUBAL LIGATION    . [place] teeth extraction      History reviewed. No pertinent family history.  Social History   Socioeconomic History  . Marital status: Married    Spouse name: Not on file  . Number of children: Not on file  . Years of education: Not on file  . Highest education level: Not on file  Social Needs  . Financial resource strain: Not on file  . Food insecurity - worry:  Not on file  . Food insecurity - inability: Not on file  . Transportation needs - medical: Not on file  . Transportation needs - non-medical: Not on file  Occupational History  . Not on file  Tobacco Use  . Smoking status: Never Smoker  . Smokeless tobacco: Never Used  Substance and Sexual Activity  . Alcohol use: No  . Drug use: No  . Sexual activity: Not on file  Other Topics Concern  . Not on file  Social History Narrative  . Not on file    Past Medical History, Surgical history, Social history, and Family history were reviewed and updated as appropriate.   Please see review of systems for further details on the patient's review from today.   Review of Systems:  Review of Systems  Constitutional: Positive for fatigue. Negative for appetite change, chills, diaphoresis and fever.  HENT: Negative for trouble swallowing.   Respiratory: Positive for cough and shortness of breath. Negative for  choking, chest tightness and wheezing.   Cardiovascular: Positive for palpitations. Negative for chest pain.  Gastrointestinal: Negative for constipation, diarrhea, nausea and vomiting.  Genitourinary: Negative for difficulty urinating and dysuria.    Objective:   Physical Exam:  BP 132/87 (BP Location: Left Arm, Patient Position: Sitting)   Pulse (!) 160 Comment: Walking  Temp 99.1 F (37.3 C) (Oral) Comment: Pt denied chills or feeling hot  Resp 20   Ht 5\' 5"  (1.651 m)   Wt 138 lb 14.4 oz (63 kg)   SpO2 (!) 87%   BMI 23.11 kg/m  ECOG: 2  Room air at rest:    90 % Room air with ambulation:   87 % At rest with oxygen at 2LPM via an [place]: 94 %  Physical Exam  Constitutional: No distress.  HENT:  Head: Normocephalic and atraumatic.  Cardiovascular: S1 normal and S2 normal. Tachycardia present.  Pulmonary/Chest: Effort normal. No respiratory distress.      Musculoskeletal: She exhibits no edema.  Neurological: She is alert.  Skin: Skin is warm and dry. No rash noted. She is not diaphoretic. No erythema.    Lab Review:     Component Value Date/Time   NA 139 12/18/2017 1327   NA 142 10/10/2017 0853   K 3.7 12/18/2017 1327   K 3.4 (L) 10/10/2017 0853   CL 104 12/18/2017 1327   CO2 23 12/18/2017 1327   CO2 24 10/10/2017 0853   GLUCOSE 140 12/18/2017 1327   GLUCOSE 115 10/10/2017 0853   BUN 9 12/18/2017 1327   BUN 7.0 10/10/2017 0853   CREATININE 0.73 12/18/2017 1327   CREATININE 0.8 10/10/2017 0853   CALCIUM 9.3 12/18/2017 1327   CALCIUM 9.6 10/10/2017 0853   PROT 7.1 12/18/2017 1327   PROT 7.6 10/10/2017 0853   ALBUMIN 2.4 (L) 12/18/2017 1327   ALBUMIN 3.4 (L) 10/10/2017 0853   AST 33 12/18/2017 1327   AST 29 10/10/2017 0853   ALT 25 12/18/2017 1327   ALT 20 10/10/2017 0853   ALKPHOS 333 (H) 12/18/2017 1327   ALKPHOS 208 (H) 10/10/2017 0853   BILITOT 0.7 12/18/2017 1327   BILITOT 0.31 10/10/2017 0853   GFRNONAA >60 12/18/2017 1327   GFRAA >60 12/18/2017  1327       Component Value Date/Time   WBC 12.6 (H) 12/18/2017 1327   WBC 6.7 12/05/2017 2117   RBC 4.47 12/18/2017 1327   HGB 11.0 (L) 12/05/2017 2117   HGB 10.7 (L) 10/10/2017 0853   HCT 36.8  12/18/2017 1327   HCT 32.1 (L) 10/10/2017 0853   PLT 241 12/18/2017 1327   PLT 272 10/10/2017 0853   MCV 82.3 12/18/2017 1327   MCV 87.7 10/10/2017 0853   MCH 26.5 12/18/2017 1327   MCHC 32.2 12/18/2017 1327   RDW 20.9 (H) 12/18/2017 1327   RDW 15.1 (H) 10/10/2017 0853   LYMPHSABS 0.3 (L) 12/18/2017 1327   LYMPHSABS 0.7 (L) 10/10/2017 0853   MONOABS 0.5 12/18/2017 1327   MONOABS 0.2 10/10/2017 0853   EOSABS 0.0 12/18/2017 1327   EOSABS 0.0 10/10/2017 0853   BASOSABS 0.0 12/18/2017 1327   BASOSABS 0.0 10/10/2017 0853   -------------------------------  Imaging from last 24 hours (if applicable):  Radiology interpretation: Dg Chest 2 View  Result Date: 12/10/2017 CLINICAL DATA:  Fever chills EXAM: CHEST  2 VIEW COMPARISON:  12/05/2017, CT chest 11/04/2017 FINDINGS: Moderate large left pleural effusion, slight increased compared to prior. Consolidation at the lingula and left base. Left-sided central venous port tip overlies the SVC origin. Surgical clips in the right axilla and reconstructed breast. Possible ground-glass opacity at the right lung apex. Partial obscuration of the cardiomediastinal silhouette. No pneumothorax IMPRESSION: 1. Moderate large left pleural effusion, increased compared to prior. Consolidation at the lingula and left base, atelectasis or pneumonia 2. Possible ground-glass opacity/infiltrate at the right lung apex Electronically Signed   By: Donavan Foil M.D.   On: 12/10/2017 16:35   Dg Chest 2 View  Result Date: 12/05/2017 CLINICAL DATA:  Chest pain cough and shortness of breath. History of breast cancer. EXAM: CHEST  2 VIEW COMPARISON:  Chest CT 11/04/2017 FINDINGS: Power injectable left chest wall Port-A-Cath follows a left subclavian approach with tip in the  lower SVC. There is a medium-sized left pleural effusion with associated atelectasis. The right lung is clear. There are right axillary surgical clips and postsurgical changes of the right breast. IMPRESSION: Medium-sized left pleural effusion with associated atelectasis, unchanged from chest CT of 11/04/2017. Electronically Signed   By: Ulyses Jarred M.D.   On: 12/05/2017 22:02   Ct Angio Chest Pe W Or Wo Contrast  Result Date: 12/18/2017 CLINICAL DATA:  Shortness of breath, history of metastatic breast carcinoma EXAM: CT ANGIOGRAPHY CHEST WITH CONTRAST TECHNIQUE: Multidetector CT imaging of the chest was performed using the standard protocol during bolus administration of intravenous contrast. Multiplanar CT image reconstructions and MIPs were obtained to evaluate the vascular anatomy. CONTRAST:  60mL ISOVUE-370 IOPAMIDOL (ISOVUE-370) INJECTION 76% COMPARISON:  11/04/2017 CT of the chest, 12/10/2017 chest x-ray FINDINGS: Cardiovascular: Thoracic aorta shows a normal branching pattern. No aneurysmal dilatation is noted. No dissection is seen. The heart is not significantly enlarged. Pulmonary artery demonstrates a normal branching pattern. No filling defects to suggest pulmonary emboli are identified. Mediastinum/Nodes: Thoracic inlet is well visualized and demonstrates a left supraclavicular/cervical lymph node adjacent to the common carotid artery best seen on image number 23 of series 5. This is slightly more prominent than on the prior exam. No significant hilar adenopathy is noted. The right internal mammary node is stable in appearance best seen on image number 86 of series 5. The more inferior lymph node is again identified measuring 1.4 cm in short axis relatively stable from the prior exam. No other significant mediastinal adenopathy is noted. The esophagus is within normal limits. Lungs/Pleura: Post radiation changes are again noted in the right upper lobe is somewhat more consolidated when compared  with the prior exam. Some acute infiltrate is seen along the posterior aspect  of the right upper lobe new from the prior exam. The remainder of the right lung is well aerated without focal infiltrate. There are 2 nodular densities along the diaphragm best seen on image number 103 of series 10 and 102 of series 10. The more medial nodule is slightly enlarged measuring 6 mm. The more lateral nodule measures approximately 7 mm increased from approximately for mm on the prior exam. The left lung again demonstrates a pleural effusion which has decreased from the recent chest x-ray dated 12/10/2017 consistent with the recent thoracentesis. No pneumothorax is noted. Considerable alveolar changes are noted throughout the left lung likely related to re-expansion edema. Continued follow-up with chest x-ray is recommended. Upper Abdomen: Visualized upper abdomen again demonstrates multiple hypodense lesions within the liver consistent with metastatic disease. The largest of these centrally measures approximately 3 cm and appears increased from the prior exam a which time it measured 2 cm in greatest dimension. Some fullness of the biliary system in the left lobe is again identified likely related to central obstruction stable from the prior study. The underlying central mass is not as well appreciated on today's exam. No other focal abnormality is noted in the upper abdomen. Musculoskeletal: Scattered areas of lucency are identified throughout the thoracic spine similar to that seen on prior exams consistent with metastatic disease. The overall appearance is stable. Persistent sternal metastatic disease is noted as well. The metastatic disease in the scapula on the left is stable. Right breast implant is noted. Soft tissue mass in the right subpectoral region is seen surrounding previous surgical clips and extending towards the chest wall. It would be difficult to exclude some chest wall invasion on the basis of this exam. The  soft tissue lesion lateral to the breast implant is again identified but incompletely evaluated due to the timing of the contrast bolus. It again measures approximately 19 mm. Left chest wall port is noted. Previously seen small axillary nodes are again identified and stable. Review of the MIP images confirms the above findings. IMPRESSION: No evidence of pneumothorax following left thoracentesis. There is however a considerable amount of parenchymal edema identified likely related to re-expansion edema. Continued follow-up with chest x-ray is recommended. No evidence of pulmonary embolism. Changes consistent with the known history of metastatic breast carcinoma with persistent subpectoral and Peri-implant lesions as well as right internal mammary adenopathy similar to that seen on the prior exam. The known hepatic metastatic disease has increased in size when compare with the prior exam. Persistent left biliary ductal dilatation is noted consistent with central obstructing lesion. Diffuse osseous metastatic disease is again seen and stable from the prior study. Post radiation changes in the right upper lobe although some new infiltrative changes are noted in the right upper lobe which may represent a super imposed pneumonic infiltrate. These results will be called to the ordering clinician or representative by the Radiologist Assistant, and communication documented in the PACS or zVision Dashboard. Electronically Signed   By: Inez Catalina M.D.   On: 12/18/2017 12:59   US Thoracentesis Asp Pleural Space W/img Guide  Result Date: 12/18/2017 INDICATION: Patient with history of metastatic right breast carcinoma, dyspnea, left pleural effusion. Request made for diagnostic and therapeutic left thoracentesis. EXAM: ULTRASOUND GUIDED DIAGNOSTIC AND THERAPEUTIC LEFT THORACENTESIS MEDICATIONS: None. COMPLICATIONS: None immediate. PROCEDURE: An ultrasound guided thoracentesis was thoroughly discussed with the patient and  questions answered. The benefits, risks, alternatives and complications were also discussed. The patient understands and wishes to proceed with  the procedure. Written consent was obtained. Ultrasound was performed to localize and mark an adequate pocket of fluid in the left chest. The area was then prepped and draped in the normal sterile fashion. 1% Lidocaine was used for local anesthesia. Under ultrasound guidance a 6 Fr Safe-T-Centesis catheter was introduced. Thoracentesis was performed. The catheter was removed and a dressing applied. FINDINGS: A total of approximately 1.8 liters of bloody fluid was removed. Samples were sent to the laboratory as requested by the clinical team. Due to persistent patient coughing/chest discomfort only the above amount of fluid was removed today. IMPRESSION: Successful ultrasound guided diagnostic and therapeutic left thoracentesis yielding 1.8 liters of pleural fluid. Read by: Rowe Robert, PA-C Electronically Signed   By: Lucrezia Europe M.D.   On: 12/18/2017 11:40

## 2017-12-19 ENCOUNTER — Other Ambulatory Visit: Payer: Self-pay | Admitting: *Deleted

## 2017-12-19 ENCOUNTER — Telehealth: Payer: Self-pay | Admitting: *Deleted

## 2017-12-19 NOTE — Addendum Note (Signed)
Addended by: Harle Stanford on: 12/19/2017 12:33 PM   Modules accepted: Orders

## 2017-12-19 NOTE — Telephone Encounter (Signed)
This RN obtained verification with Delia Chimes with [facility]/hospital liason that O2 order is being processed and will be delivered to pt today.

## 2017-12-24 ENCOUNTER — Inpatient Hospital Stay (HOSPITAL_BASED_OUTPATIENT_CLINIC_OR_DEPARTMENT_OTHER): Payer: 59 | Admitting: Hematology and Oncology

## 2017-12-24 ENCOUNTER — Inpatient Hospital Stay: Payer: 59

## 2017-12-24 DIAGNOSIS — E876 Hypokalemia: Secondary | ICD-10-CM | POA: Diagnosis not present

## 2017-12-24 DIAGNOSIS — C787 Secondary malignant neoplasm of liver and intrahepatic bile duct: Secondary | ICD-10-CM

## 2017-12-24 DIAGNOSIS — M25561 Pain in right knee: Secondary | ICD-10-CM

## 2017-12-24 DIAGNOSIS — J910 Malignant pleural effusion: Secondary | ICD-10-CM | POA: Diagnosis not present

## 2017-12-24 DIAGNOSIS — C773 Secondary and unspecified malignant neoplasm of axilla and upper limb lymph nodes: Secondary | ICD-10-CM | POA: Diagnosis not present

## 2017-12-24 DIAGNOSIS — C50411 Malignant neoplasm of upper-outer quadrant of right female breast: Secondary | ICD-10-CM

## 2017-12-24 DIAGNOSIS — Z171 Estrogen receptor negative status [ER-]: Secondary | ICD-10-CM | POA: Diagnosis not present

## 2017-12-24 DIAGNOSIS — C7951 Secondary malignant neoplasm of bone: Secondary | ICD-10-CM

## 2017-12-24 DIAGNOSIS — Z5112 Encounter for antineoplastic immunotherapy: Secondary | ICD-10-CM | POA: Diagnosis not present

## 2017-12-24 LAB — CBC WITH DIFFERENTIAL (CANCER CENTER ONLY)
Basophils Absolute: 0 10*3/uL (ref 0.0–0.1)
Basophils Relative: 1 %
EOS ABS: 0 10*3/uL (ref 0.0–0.5)
EOS PCT: 1 %
HCT: 27.8 % — ABNORMAL LOW (ref 34.8–46.6)
HEMOGLOBIN: 9 g/dL — AB (ref 11.6–15.9)
LYMPHS ABS: 0.3 10*3/uL — AB (ref 0.9–3.3)
Lymphocytes Relative: 7 %
MCH: 26.5 pg (ref 25.1–34.0)
MCHC: 32.4 g/dL (ref 31.5–36.0)
MCV: 81.9 fL (ref 79.5–101.0)
MONOS PCT: 12 %
Monocytes Absolute: 0.5 10*3/uL (ref 0.1–0.9)
NEUTROS PCT: 79 %
Neutro Abs: 3.4 10*3/uL (ref 1.5–6.5)
Platelet Count: 234 10*3/uL (ref 145–400)
RBC: 3.39 MIL/uL — ABNORMAL LOW (ref 3.70–5.45)
RDW: 21.4 % — ABNORMAL HIGH (ref 11.2–14.5)
WBC Count: 4.3 10*3/uL (ref 3.9–10.3)

## 2017-12-24 LAB — CMP (CANCER CENTER ONLY)
ALK PHOS: 311 U/L — AB (ref 40–150)
ALT: 26 U/L (ref 0–55)
ANION GAP: 11 (ref 3–11)
AST: 39 U/L — ABNORMAL HIGH (ref 5–34)
Albumin: 2.6 g/dL — ABNORMAL LOW (ref 3.5–5.0)
BUN: 6 mg/dL — ABNORMAL LOW (ref 7–26)
CALCIUM: 9.3 mg/dL (ref 8.4–10.4)
CO2: 24 mmol/L (ref 22–29)
Chloride: 105 mmol/L (ref 98–109)
Creatinine: 0.68 mg/dL (ref 0.60–1.10)
Glucose, Bld: 82 mg/dL (ref 70–140)
Potassium: 3.1 mmol/L — ABNORMAL LOW (ref 3.5–5.1)
SODIUM: 140 mmol/L (ref 136–145)
TOTAL PROTEIN: 7.2 g/dL (ref 6.4–8.3)
Total Bilirubin: 0.3 mg/dL (ref 0.2–1.2)

## 2017-12-24 MED ORDER — SODIUM CHLORIDE 0.9% FLUSH
10.0000 mL | INTRAVENOUS | Status: AC | PRN
  Administered 2017-12-24: 10 mL
  Filled 2017-12-24: qty 10

## 2017-12-24 MED ORDER — SODIUM CHLORIDE 0.9 % IV SOLN
200.0000 mg | Freq: Once | INTRAVENOUS | Status: AC
  Administered 2017-12-24: 200 mg via INTRAVENOUS
  Filled 2017-12-24: qty 8

## 2017-12-24 MED ORDER — PALONOSETRON HCL INJECTION 0.25 MG/5ML
INTRAVENOUS | Status: AC
  Filled 2017-12-24: qty 5

## 2017-12-24 MED ORDER — OXYCODONE HCL 5 MG PO TABS: 5.0000 mg | ORAL_TABLET | ORAL | 0 refills | Status: DC | PRN

## 2017-12-24 MED ORDER — PALONOSETRON HCL INJECTION 0.25 MG/5ML
0.2500 mg | Freq: Once | INTRAVENOUS | Status: AC
  Administered 2017-12-24: 0.25 mg via INTRAVENOUS

## 2017-12-24 MED ORDER — SODIUM CHLORIDE 0.9 % IV SOLN
Freq: Once | INTRAVENOUS | Status: AC
  Administered 2017-12-24: 16:00:00 via INTRAVENOUS

## 2017-12-24 MED ORDER — HEPARIN SOD (PORK) LOCK FLUSH 100 UNIT/ML IV SOLN
500.0000 [IU] | Freq: Once | INTRAVENOUS | Status: AC | PRN
  Administered 2017-12-24: 500 [IU]
  Filled 2017-12-24: qty 5

## 2017-12-24 NOTE — Progress Notes (Unsigned)
Spoke to desk RN May for [facility] about potassium today of 3.1.  Continuing with treatment, pt advised to increase oral potassium intake at home.  Verbalized understanding.

## 2017-12-24 NOTE — Progress Notes (Signed)
 Patient Care Team: Griffin, Elaine, MD as PCP - General (Family Medicine) Magrinat, Gustav C, MD as Consulting Physician (Oncology) Wentworth, Stacy, MD (Inactive) as Consulting Physician (Radiation Oncology) Stuart, Dawn C, RN as Registered Nurse Martini, Keisha N, RN as Registered Nurse Newman, David, MD as Consulting Physician (General Surgery) Mackey, Heather Thompson, NP as Nurse Practitioner (Hematology and Oncology)  DIAGNOSIS:  Encounter Diagnosis  Name Primary?  . Malignant neoplasm of upper-outer quadrant of right breast in female, estrogen receptor negative (HCC)     SUMMARY OF ONCOLOGIC HISTORY:   Breast cancer of upper-outer quadrant of right female breast (HCC)   01/19/2015 Mammogram    Right breast: density with indistinct margin at 12:00, posterior depth; also oval mass with obscured margin at 11:00, middle depth; irregular mass with indistinct margin central to nipple, middle depth      01/23/2015 Initial Biopsy    Right breast 9:00 and 10:00 biopsy ([facility]): Grade 3 IDC ER/PR negative, HER-2 negative, Ki-67 77% and 73%; the right axillary lymph node biopsy: ER 22% positive      01/30/2015 Breast MRI    Right breast masses 9 and 10:00 position, additional irregular masses with non-mass enhancement upper outer quadrant with one mass extending into upper inner quadrant, multifocal, multicentric 13.7 x 4.6 x 4.6 cm, malignant right axillary lymph node      02/08/2015 Clinical Stage    Stage IIIA: T3, N1, M0      02/09/2015 Procedure    BreastNext panel revealed VUS BRCA2 gene called [id number] (c.[id number]>G); otherwise no clinically significant variant at ATM, BARD1, BRCA1, BRCA2, BRIP1, CDH1, CHEK2, MRE11A, MUTYH, NBN, NF1, PALB2, PTEN, RAD50, RAD51C, RAD51D, and TP53      02/15/2015 Imaging    No evidence of metastatic disease, 2.2 cm enhancing lesion anterior left lower kidney suspicious for solid renal neoplasm      02/20/2015 - 07/10/2015 Neo-Adjuvant Chemotherapy   dose dense doxorubicin and cyclophosphamide x 4 followed by Abraxane weekly x 12      07/10/2015 Breast MRI    Multiple irregular enhancing masses right breast are less confluent and smaller dominant mass 10:00 2.1 x 1.9 x 1.3 cm; right breast 11:00 mass measures 1.2 x 1.2 x 0.8 cm, decreased right axillary lymph node thickening      07/28/2015 Definitive Surgery    Rt mastectomy: Multifocal IDC 2 cm, 1.5 cm. 0.6 cm; 3/13 LN positive, ER/PR HER-2      07/28/2015 Pathologic Stage    Stage IIA: T1c N1 M0        09/03/2015 - 10/13/2015 Radiation Therapy    Adjuvant radiation therapy: Right chest wall/50.4 Gray @ 1.8 Gray per fraction x 28 fractions. Right supraclavicular fossa 45 Gy @1.8 Gy per fraction x 25 fractions      11/15/2015 - 05/10/2016 Chemotherapy    Adjuvant capecitabine - completed 8 cycles      11/16/2015 Survivorship    Survivorship visit completed and copy of care plan given to patient      02/23/2016 Procedure    Left mandible biopsy: Scant material, no malignancy      05/21/2016 -  Anti-estrogen oral therapy    Patient did not take tamoxifen because her ER was weakly positive at 22% and she was worried about toxicities      02/21/2017 Imaging    Rt Axilla 8 X 7 X 7 mm mass inv pectoralis musculature, additional mass 9 mm 1.5 cm apart      02/21/2017 Relapse/Recurrence      Right axillary mass biopsy: 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      03/24/2017 Miscellaneous    Foundation 1 analysis: Microsatellite stable; TMB 9 muts/Mb; MYC amplification, PIK3C2B p717L; TP53 [id number]      03/25/2017 - 07/15/2017 Chemotherapy    Carboplatin and gemcitabine palliative chemotherapy      07/28/2017 PET scan    Progression of metastatic disease new liver lesion on 0.6 cm SUV 7.2, previous liver lesion 1.4 cm unchanged but shows no activity; scattered hypermetabolic bone lesions some of which are new, scattered pulmonary nodules stable hypermetabolic right subpectoral and  mediastinal/internal mammary nodes stable      08/22/2017 - 09/10/2017 Radiation Therapy    Palliative radiation to brachial plexus concurrently with Xeloda      09/12/2017 - 10/31/2017 Chemotherapy    Halaven day 1 day 8 every 3 weeks       12/03/2017 -  Chemotherapy    Pembrolizumab every 3 weeks        Renal cell carcinoma of left kidney ([place])   01/29/2016 Surgery    Left renal mass partial resection: 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  She comes in today complaining that her breathing is continuing to get worse.  She thinks that the pleural effusion has come back.  She is requesting for another thoracentesis.  She is in a wheelchair.  The husband reports that the patient has been sleeping most of the day.  She is also having pain in her right knee.  REVIEW OF SYSTEMS:   Constitutional: Severe fatigue and weakness Eyes: Denies blurriness of vision Ears, nose, mouth, throat, and face: Denies mucositis or sore throat Respiratory: Shortness of breath Cardiovascular: Denies palpitation, chest discomfort Gastrointestinal:  Denies nausea, heartburn or change in bowel habits Skin: Denies abnormal skin rashes Lymphatics: Denies new lymphadenopathy or easy bruising Neurological:Denies numbness, tingling or new weaknesses Behavioral/Psych: Mood is stable, no new changes  Extremities: No lower extremity edema All other systems were reviewed with the patient and are negative.  I have reviewed the past medical history, past surgical history, social history and family history with the patient and they are unchanged from previous note.  ALLERGIES:  is allergic to sulfa  antibiotics.  MEDICATIONS:  Current Outpatient Medications  Medication Sig Dispense Refill  . amLODipine (NORVASC) 5 MG tablet Take 1 tablet (5 mg total) by mouth daily. 30 tablet 1  . amoxicillin-clavulanate (AUGMENTIN) 875-125 MG tablet Take 1 tablet by mouth 2 (two) times daily. 14 tablet 0  . Black Pepper-Turmeric 3-500 MG CAPS Take 1 capsule by mouth daily.    Marland Kitchen gabapentin (NEURONTIN) 300 MG capsule Take 1 capsule (300 mg total) by mouth 3 (three) times daily. (Patient taking differently: Take 300 mg by mouth 2 (two) times daily. ) 90 capsule 3  . lidocaine-prilocaine (EMLA) cream Apply to affected area once 30 g 3  . Multiple Vitamins-Minerals (MULTIVITAMIN) tablet Take 1 tablet by mouth daily.    . nitroGLYCERIN (NITROSTAT) 0.4 MG SL tablet Place 1 tablet (0.4 mg total) under the tongue every 5 (five) minutes as needed for chest pain (call MD if not better after 2 doses). 20 tablet 12  . oxyCODONE (  OXY IR/ROXICODONE) 5 MG immediate release tablet Take 1-2 tablets (5-10 mg total) by mouth every 4 (four) hours as needed for severe pain. 30 tablet 0  . pantoprazole (PROTONIX) 40 MG tablet Take 1 tablet (40 mg total) by mouth daily. 30 tablet 1  . potassium chloride SA (K-DUR,KLOR-CON) 20 MEQ tablet TAKE 1 TABLET(20 MEQ) BY MOUTH DAILY 30 tablet 0  . Probiotic Product (PROBIOTIC PO) Take 1 tablet by mouth daily.    . prochlorperazine (COMPAZINE) 10 MG tablet Take 1 tablet (10 mg total) by mouth every 6 (six) hours as needed for nausea or vomiting. 40 tablet 1  . rivaroxaban (XARELTO) 20 MG TABS tablet Take 1 tablet (20 mg total) by mouth daily with supper. 90 tablet 1  . traZODone (DESYREL) 100 MG tablet Take 1 tablet (100 mg total) by mouth at bedtime as needed for sleep. 30 tablet 0   No current facility-administered medications for this visit.    Facility-Administered Medications Ordered in Other Visits  Medication Dose Route Frequency Provider Last Rate Last Dose  . 0.9 %  sodium  chloride infusion   Intravenous Once Nicholas Lose, MD      . heparin lock flush 100 unit/mL  500 Units Intracatheter Once PRN Nicholas Lose, MD      . palonosetron (ALOXI) injection 0.25 mg  0.25 mg Intravenous Once Nicholas Lose, MD      . pembrolizumab [facility]) 200 mg in sodium chloride 0.9 % 50 mL chemo infusion  200 mg Intravenous Once Nicholas Lose, MD      . sodium chloride flush (NS) 0.9 % injection 10 mL  10 mL Intracatheter PRN Nicholas Lose, MD        PHYSICAL EXAMINATION: ECOG PERFORMANCE STATUS: 3 - Symptomatic, >50% confined to bed  Vitals:   12/24/17 1423  BP: (!) 153/99  Pulse: (!) 134  Resp: (!) 21  Temp: 98.9 F (37.2 C)  SpO2: 94%   Filed Weights   12/24/17 1423  Weight: 142 lb 3.2 oz (64.5 kg)    GENERAL:alert, no distress and comfortable SKIN: skin color, texture, turgor are normal, no rashes or significant lesions EYES: normal, Conjunctiva are pink and non-injected, sclera clear OROPHARYNX:no exudate, no erythema and lips, buccal mucosa, and tongue normal  NECK: supple, thyroid normal size, non-tender, without nodularity LYMPH:  no palpable lymphadenopathy in the cervical, axillary or inguinal LUNGS: Shortness of breath and decreased breath sounds at the left  lung base HEART: regular rate & rhythm and no murmurs and no lower extremity edema ABDOMEN:abdomen soft, non-tender and normal bowel sounds MUSCULOSKELETAL:no cyanosis of digits and no clubbing  NEURO: alert & oriented x 3 with fluent speech, no focal motor/sensory deficits EXTREMITIES: No lower extremity edema, right knee pain and discomfort and swelling   LABORATORY DATA:  I have reviewed the data as listed CMP Latest Ref Rng & Units 12/24/2017 12/18/2017 12/09/2017  Glucose 70 - 140 mg/dL 82 140 98  BUN 7 - 26 mg/dL 6(L) 9 11  Creatinine 0.60 - 1.10 mg/dL 0.68 0.73 0.70  Sodium 136 - 145 mmol/L 140 139 143  Potassium 3.5 - 5.1 mmol/L 3.1(L) 3.7 3.3(L)  Chloride 98 - 109 mmol/L 105 104 107   CO2 22 - 29 mmol/L _0 Calcium 8.4 - 10.4 mg/dL 9.3 9.3 9.9  Total Protein 6.4 - 8.3 g/dL 7.2 7.1 7.2  Total Bilirubin 0.2 - 1.2 mg/dL 0.3 0.7 0.4  Alkaline Phos 40 - 150 U/L 311(H) 333(H) 284(H)  AST 5 - 34 U/L 39(H) 33 31  ALT 0 - 55 U/L _0 Lab Results  Component Value Date   WBC 4.3 12/24/2017   HGB 11.0 (L) 12/05/2017   HCT 27.8 (L) 12/24/2017   MCV 81.9 12/24/2017   PLT 234 12/24/2017   NEUTROABS 3.4 12/24/2017    ASSESSMENT & PLAN:  Breast cancer of upper-outer quadrant of right female breast ([place]) Right breast invasive ductal carcinoma multifocal, multicentric disease with at least 8 tumors, 2.3 cm, 1.6 cm of the biggest tumors in addition 6 more tumors 1 cm or less spanning 13.7 cm, right axillary lymph node biopsy positive (ER 22%); primary tumor is ER 0%, PR 0%, HER-2 negative, Ki-67 77% and 73% respectively, grade 3 T3 N1 M0 equals stage IIIa, Grade 3 1. Rt mastectomy 07/28/15: Multifocal IDC 2 cm, 1.5 cm. 0.6 cm; 3/13 LN positive, T1c N1 M0 stage IIA, ER/PR HER-2 negative. 2. Adjuvant XRT 09/03/15 to 10/13/15 3. Adjuvant Xeloda X 6 months (8 cycles) started 11/15/2015 completed July 2017 4. Carboplatin and gemcitabine started 03/14/2017 5. [place] day 1 day 8 every 3 weeks started 09/12/2017-10/31/17 6.  Palliative radiation therapy to we will were lesion as well as subcutaneous lesions in the back ---------------------------------------------------------------------------- Treatment plan: Pembrolizumab immunotherapy: Today is cycle 2 day 1 Toxicities:  Malignant pleural effusion: Status post thoracentesis It appears that the pleural effusion is once again feeling up. I discussed with her about Pleurx catheter versus repeat drainage.  She is not very keen on putting a Pleurx catheter at this time. We will pursue thoracentesis tomorrow.  We will get a chest x-ray today to reevaluate her lungs.   CT chest 12/18/2017: Consistent with metastatic breast  cancer with persistent subpectoral and peri-implant lesions as well as internal mammary lymph nodes.  Known hepatic metastatic disease has increased in size persistent left biliary ductal dilatation.  A central obstructing lesion, diffuse bone metastases are stable  Hypokalemia: Monitoring potassium closely Goals of care: Palliation  I discussed with the patient that she is progressively getting worse and this indicates a very poor prognostic sign.  Patient is not yet ready to talk about end of life for hospice options.  For the current time we will try to make her comfortable with the thoracentesis and will continue pembrolizumab.  If her performance status continues to decline, then we will have to stop all therapy.  This is a very difficult decision given the fact that she is so young and is not ready to give up.  Return to clinic in 3 weeks for cycle 3   I spent 25 minutes talking to the patient of which more than half was spent in counseling and coordination of care.  Orders Placed This Encounter  Procedures  . US Thoracentesis Asp Pleural space w/IMG guide    Standing Status:   Future    Standing Expiration Date:   12/24/2018    Order Specific Question:   Are labs required for specimen collection?    Answer:   No    Order Specific Question:   Reason for Exam (SYMPTOM  OR DIAGNOSIS REQUIRED)    Answer:   pleural effusion    Order Specific Question:   Preferred imaging location?    Answer:   [facility]  . DG Chest 2 View    Standing Status:   Future    Standing Expiration Date:   01/28/2019    Order Specific Question:   Reason  for exam:    Answer:   Pleural effusion    Order Specific Question:   Preferred imaging location?    Answer:   [facility]   The patient has a good understanding of the overall plan. she agrees with it. she will call with any problems that may develop before the next visit here.   Harriette Ohara, MD 12/24/17

## 2017-12-24 NOTE — Assessment & Plan Note (Signed)
Right breast invasive ductal carcinoma multifocal, multicentric disease with at least 8 tumors, 2.3 cm, 1.6 cm of the biggest tumors in addition 6 more tumors 1 cm or less spanning 13.7 cm, right axillary lymph node biopsy positive (ER 22%); primary tumor is ER 0%, PR 0%, HER-2 negative, Ki-67 77% and 73% respectively, grade 3 T3 N1 M0 equals stage IIIa, Grade 3 1. Rt mastectomy 07/28/15: Multifocal IDC 2 cm, 1.5 cm. 0.6 cm; 3/13 LN positive, T1c N1 M0 stage IIA, ER/PR HER-2 negative. 2. Adjuvant XRT 09/03/15 to 10/13/15 3. Adjuvant Xeloda X 6 months (8 cycles) started 11/15/2015 completed July 2017 4. Carboplatin and gemcitabine started 03/14/2017 5. Halaven day 1 day 8 every 3 weeks started 09/12/2017-10/31/17 6.  Palliative radiation therapy to we will were lesion as well as subcutaneous lesions in the back ---------------------------------------------------------------------------- Treatment plan: Pembrolizumab immunotherapy: Today is cycle 2 day 1 Toxicities:  Malignant pleural effusion: Status post thoracentesis CT chest 12/18/2017: Consistent with metastatic breast cancer with persistent subpectoral and peri-implant lesions as well as internal mammary lymph nodes.  Known hepatic metastatic disease has increased in size persistent left biliary ductal dilatation.  A central obstructing lesion, diffuse bone metastases are stable  Hypokalemia: Monitoring potassium closely Goals of care: Palliation  Return to clinic in 3 weeks for cycle 3

## 2017-12-24 NOTE — Patient Instructions (Signed)
[facility] Discharge Instructions for Patients Receiving Chemotherapy  Today you received the following chemotherapy agent: Keytruda  To help prevent nausea and vomiting after your treatment, we encourage you to take your nausea medication as directed. No Zofran for 3 days, take Compazine instead.   If you develop nausea and vomiting that is not controlled by your nausea medication, call the clinic.   BELOW ARE SYMPTOMS THAT SHOULD BE REPORTED IMMEDIATELY:  *FEVER GREATER THAN 100.5 F  *CHILLS WITH OR WITHOUT FEVER  NAUSEA AND VOMITING THAT IS NOT CONTROLLED WITH YOUR NAUSEA MEDICATION  *UNUSUAL SHORTNESS OF BREATH  *UNUSUAL BRUISING OR BLEEDING  TENDERNESS IN MOUTH AND THROAT WITH OR WITHOUT PRESENCE OF ULCERS  *URINARY PROBLEMS  *BOWEL PROBLEMS  UNUSUAL RASH Items with * indicate a potential emergency and should be followed up as soon as possible.  Feel free to call the clinic should you have any questions or concerns. The clinic phone number is [phone].  Please show the [facility] at check-in to the Emergency Department and triage nurse.  Pembrolizumab injection What is this medicine? PEMBROLIZUMAB (pem broe liz ue mab) is a monoclonal antibody. It is used to treat melanoma, head and neck cancer, Hodgkin lymphoma, non-small cell lung cancer, urothelial cancer, stomach cancer, and cancers that have a certain genetic condition. This medicine may be used for other purposes; ask your health care provider or pharmacist if you have questions. COMMON BRAND NAME(S): Keytruda What should I tell my health care provider before I take this medicine? They need to know if you have any of these conditions: -diabetes -immune system problems -inflammatory bowel disease -liver disease -lung or breathing disease -lupus -organ transplant -an unusual or allergic reaction to pembrolizumab, other medicines, foods, dyes, or preservatives -pregnant or trying to  get pregnant -breast-feeding How should I use this medicine? This medicine is for infusion into a vein. It is given by a health care professional in a hospital or clinic setting. A special MedGuide will be given to you before each treatment. Be sure to read this information carefully each time. Talk to your pediatrician regarding the use of this medicine in children. While this drug may be prescribed for selected conditions, precautions do apply. Overdosage: If you think you have taken too much of this medicine contact a poison control center or emergency room at once. NOTE: This medicine is only for you. Do not share this medicine with others. What if I miss a dose? It is important not to miss your dose. Call your doctor or health care professional if you are unable to keep an appointment. What may interact with this medicine? Interactions have not been studied. Give your health care provider a list of all the medicines, herbs, non-prescription drugs, or dietary supplements you use. Also tell them if you smoke, drink alcohol, or use illegal drugs. Some items may interact with your medicine. This list may not describe all possible interactions. Give your health care provider a list of all the medicines, herbs, non-prescription drugs, or dietary supplements you use. Also tell them if you smoke, drink alcohol, or use illegal drugs. Some items may interact with your medicine. What should I watch for while using this medicine? Your condition will be monitored carefully while you are receiving this medicine. You may need blood work done while you are taking this medicine. Do not become pregnant while taking this medicine or for 4 months after stopping it. Women should inform their doctor if they  wish to become pregnant or think they might be pregnant. There is a potential for serious side effects to an unborn child. Talk to your health care professional or pharmacist for more information. Do not  breast-feed an infant while taking this medicine or for 4 months after the last dose. What side effects may I notice from receiving this medicine? Side effects that you should report to your doctor or health care professional as soon as possible: -allergic reactions like skin rash, itching or hives, swelling of the face, lips, or tongue -bloody or black, tarry -breathing problems -changes in vision -chest pain -chills -constipation -cough -dizziness or feeling faint or lightheaded -fast or irregular heartbeat -fever -flushing -hair loss -low blood counts - this medicine may decrease the number of white blood cells, red blood cells and platelets. You may be at increased risk for infections and bleeding. -muscle pain -muscle weakness -persistent headache -signs and symptoms of high blood sugar such as dizziness; dry mouth; dry skin; fruity breath; nausea; stomach pain; increased hunger or thirst; increased urination -signs and symptoms of kidney injury like trouble passing urine or change in the amount of urine -signs and symptoms of liver injury like dark urine, light-colored stools, loss of appetite, nausea, right upper belly pain, yellowing of the eyes or skin -stomach pain -sweating -weight loss Side effects that usually do not require medical attention (report to your doctor or health care professional if they continue or are bothersome): -decreased appetite -diarrhea -tiredness This list may not describe all possible side effects. Call your doctor for medical advice about side effects. You may report side effects to FDA at 1-800-FDA-1088. Where should I keep my medicine? This drug is given in a hospital or clinic and will not be stored at home. NOTE: This sheet is a summary. It may not cover all possible information. If you have questions about this medicine, talk to your doctor, pharmacist, or health care provider.  2018 Elsevier/Gold Standard (2016-07-09  12:29:36)    Pembrolizumab injection What is this medicine? PEMBROLIZUMAB (pem broe liz ue mab) is a monoclonal antibody. It is used to treat melanoma, head and neck cancer, Hodgkin lymphoma, non-small cell lung cancer, urothelial cancer, stomach cancer, and cancers that have a certain genetic condition. This medicine may be used for other purposes; ask your health care provider or pharmacist if you have questions. COMMON BRAND NAME(S): Keytruda What should I tell my health care provider before I take this medicine? They need to know if you have any of these conditions: -diabetes -immune system problems -inflammatory bowel disease -liver disease -lung or breathing disease -lupus -organ transplant -an unusual or allergic reaction to pembrolizumab, other medicines, foods, dyes, or preservatives -pregnant or trying to get pregnant -breast-feeding How should I use this medicine? This medicine is for infusion into a vein. It is given by a health care professional in a hospital or clinic setting. A special MedGuide will be given to you before each treatment. Be sure to read this information carefully each time. Talk to your pediatrician regarding the use of this medicine in children. While this drug may be prescribed for selected conditions, precautions do apply. Overdosage: If you think you have taken too much of this medicine contact a poison control center or emergency room at once. NOTE: This medicine is only for you. Do not share this medicine with others. What if I miss a dose? It is important not to miss your dose. Call your doctor or health care professional if  you are unable to keep an appointment. What may interact with this medicine? Interactions have not been studied. Give your health care provider a list of all the medicines, herbs, non-prescription drugs, or dietary supplements you use. Also tell them if you smoke, drink alcohol, or use illegal drugs. Some items may interact  with your medicine. This list may not describe all possible interactions. Give your health care provider a list of all the medicines, herbs, non-prescription drugs, or dietary supplements you use. Also tell them if you smoke, drink alcohol, or use illegal drugs. Some items may interact with your medicine. What should I watch for while using this medicine? Your condition will be monitored carefully while you are receiving this medicine. You may need blood work done while you are taking this medicine. Do not become pregnant while taking this medicine or for 4 months after stopping it. Women should inform their doctor if they wish to become pregnant or think they might be pregnant. There is a potential for serious side effects to an unborn child. Talk to your health care professional or pharmacist for more information. Do not breast-feed an infant while taking this medicine or for 4 months after the last dose. What side effects may I notice from receiving this medicine? Side effects that you should report to your doctor or health care professional as soon as possible: -allergic reactions like skin rash, itching or hives, swelling of the face, lips, or tongue -bloody or black, tarry -breathing problems -changes in vision -chest pain -chills -constipation -cough -dizziness or feeling faint or lightheaded -fast or irregular heartbeat -fever -flushing -hair loss -low blood counts - this medicine may decrease the number of white blood cells, red blood cells and platelets. You may be at increased risk for infections and bleeding. -muscle pain -muscle weakness -persistent headache -signs and symptoms of high blood sugar such as dizziness; dry mouth; dry skin; fruity breath; nausea; stomach pain; increased hunger or thirst; increased urination -signs and symptoms of kidney injury like trouble passing urine or change in the amount of urine -signs and symptoms of liver injury like dark urine,  light-colored stools, loss of appetite, nausea, right upper belly pain, yellowing of the eyes or skin -stomach pain -sweating -weight loss Side effects that usually do not require medical attention (report to your doctor or health care professional if they continue or are bothersome): -decreased appetite -diarrhea -tiredness This list may not describe all possible side effects. Call your doctor for medical advice about side effects. You may report side effects to FDA at 1-800-FDA-1088. Where should I keep my medicine? This drug is given in a hospital or clinic and will not be stored at home. NOTE: This sheet is a summary. It may not cover all possible information. If you have questions about this medicine, talk to your doctor, pharmacist, or health care provider.  2018 Elsevier/Gold Standard (2016-07-09 12:29:36)

## 2017-12-25 ENCOUNTER — Other Ambulatory Visit: Payer: Self-pay | Admitting: Adult Health

## 2017-12-25 ENCOUNTER — Telehealth: Payer: Self-pay | Admitting: *Deleted

## 2017-12-25 NOTE — Telephone Encounter (Signed)
Left message for return call to check status of patient- patient has not been scheduled for Thoracentesis ordered STAT 3/13. Central scheduling called to have one of their staff try to reach patient to schedule.

## 2017-12-26 ENCOUNTER — Ambulatory Visit (HOSPITAL_COMMUNITY)
Admission: RE | Admit: 2017-12-26 | Discharge: 2017-12-26 | Disposition: A | Discharge status: Home / Self Care | Payer: 59 | Source: Ambulatory Visit | Attending: Radiology | Admitting: Radiology

## 2017-12-26 ENCOUNTER — Ambulatory Visit (HOSPITAL_COMMUNITY): Admission: RE | Admit: 2017-12-26 | Payer: 59 | Source: Ambulatory Visit

## 2017-12-26 ENCOUNTER — Other Ambulatory Visit: Payer: Self-pay

## 2017-12-26 ENCOUNTER — Ambulatory Visit (HOSPITAL_COMMUNITY)
Admission: RE | Admit: 2017-12-26 | Discharge: 2017-12-26 | Disposition: A | Discharge status: Home / Self Care | Payer: 59 | Source: Ambulatory Visit | Attending: Adult Health | Admitting: Adult Health

## 2017-12-26 DIAGNOSIS — Z9889 Other specified postprocedural states: Secondary | ICD-10-CM

## 2017-12-26 DIAGNOSIS — C50411 Malignant neoplasm of upper-outer quadrant of right female breast: Secondary | ICD-10-CM | POA: Diagnosis not present

## 2017-12-26 DIAGNOSIS — Z452 Encounter for adjustment and management of vascular access device: Secondary | ICD-10-CM

## 2017-12-26 DIAGNOSIS — R0602 Shortness of breath: Secondary | ICD-10-CM | POA: Diagnosis not present

## 2017-12-26 DIAGNOSIS — M25561 Pain in right knee: Secondary | ICD-10-CM

## 2017-12-26 DIAGNOSIS — Z171 Estrogen receptor negative status [ER-]: Principal | ICD-10-CM

## 2017-12-26 MED ORDER — LIDOCAINE HCL 1 % IJ SOLN
INTRAMUSCULAR | Status: AC
  Filled 2017-12-26: qty 20

## 2017-12-26 NOTE — Progress Notes (Signed)
Referral made for orthopaedics for Right Knee pain, per Dr.Gudena.

## 2017-12-26 NOTE — Procedures (Addendum)
Ultrasound-guided  therapeutic left thoracentesis performed yielding 900 cc of bloody fluid. No immediate complications. Follow-up chest x-ray pending.Due to pt coughing and chest discomfort only the above amount of fluid was removed today.The pleural fluid collection was multiseptated.

## 2017-12-29 ENCOUNTER — Encounter (HOSPITAL_COMMUNITY): Payer: Self-pay

## 2017-12-29 ENCOUNTER — Inpatient Hospital Stay (HOSPITAL_COMMUNITY): Payer: 59

## 2017-12-29 ENCOUNTER — Encounter: Payer: Self-pay | Admitting: *Deleted

## 2017-12-29 ENCOUNTER — Other Ambulatory Visit: Payer: Self-pay

## 2017-12-29 ENCOUNTER — Emergency Department (HOSPITAL_COMMUNITY): Payer: 59

## 2017-12-29 ENCOUNTER — Inpatient Hospital Stay (HOSPITAL_COMMUNITY)
Admission: EM | Admit: 2017-12-29 | Discharge: 2018-01-07 | DRG: 597 | Disposition: A | Discharge status: Home Health | Payer: 59 | Attending: Internal Medicine | Admitting: Internal Medicine

## 2017-12-29 DIAGNOSIS — Y842 Radiological procedure and radiotherapy as the cause of abnormal reaction of the patient, or of later complication, without mention of misadventure at the time of the procedure: Secondary | ICD-10-CM | POA: Diagnosis present

## 2017-12-29 DIAGNOSIS — Z85528 Personal history of other malignant neoplasm of kidney: Secondary | ICD-10-CM | POA: Diagnosis not present

## 2017-12-29 DIAGNOSIS — C50411 Malignant neoplasm of upper-outer quadrant of right female breast: Secondary | ICD-10-CM | POA: Diagnosis present

## 2017-12-29 DIAGNOSIS — D573 Sickle-cell trait: Secondary | ICD-10-CM | POA: Diagnosis present

## 2017-12-29 DIAGNOSIS — J700 Acute pulmonary manifestations due to radiation: Secondary | ICD-10-CM | POA: Diagnosis present

## 2017-12-29 DIAGNOSIS — D638 Anemia in other chronic diseases classified elsewhere: Secondary | ICD-10-CM | POA: Diagnosis present

## 2017-12-29 DIAGNOSIS — J948 Other specified pleural conditions: Secondary | ICD-10-CM | POA: Diagnosis present

## 2017-12-29 DIAGNOSIS — Z905 Acquired absence of kidney: Secondary | ICD-10-CM | POA: Diagnosis not present

## 2017-12-29 DIAGNOSIS — J189 Pneumonia, unspecified organism: Secondary | ICD-10-CM | POA: Diagnosis present

## 2017-12-29 DIAGNOSIS — I5030 Unspecified diastolic (congestive) heart failure: Secondary | ICD-10-CM | POA: Diagnosis not present

## 2017-12-29 DIAGNOSIS — Z923 Personal history of irradiation: Secondary | ICD-10-CM

## 2017-12-29 DIAGNOSIS — Z7901 Long term (current) use of anticoagulants: Secondary | ICD-10-CM

## 2017-12-29 DIAGNOSIS — Z86718 Personal history of other venous thrombosis and embolism: Secondary | ICD-10-CM | POA: Diagnosis not present

## 2017-12-29 DIAGNOSIS — J9601 Acute respiratory failure with hypoxia: Secondary | ICD-10-CM | POA: Diagnosis present

## 2017-12-29 DIAGNOSIS — D649 Anemia, unspecified: Secondary | ICD-10-CM

## 2017-12-29 DIAGNOSIS — J910 Malignant pleural effusion: Secondary | ICD-10-CM | POA: Diagnosis not present

## 2017-12-29 DIAGNOSIS — J869 Pyothorax without fistula: Secondary | ICD-10-CM | POA: Diagnosis present

## 2017-12-29 DIAGNOSIS — Z9221 Personal history of antineoplastic chemotherapy: Secondary | ICD-10-CM

## 2017-12-29 DIAGNOSIS — J90 Pleural effusion, not elsewhere classified: Secondary | ICD-10-CM

## 2017-12-29 DIAGNOSIS — R000 Tachycardia, unspecified: Secondary | ICD-10-CM | POA: Diagnosis not present

## 2017-12-29 DIAGNOSIS — I10 Essential (primary) hypertension: Secondary | ICD-10-CM | POA: Diagnosis present

## 2017-12-29 DIAGNOSIS — Z9011 Acquired absence of right breast and nipple: Secondary | ICD-10-CM | POA: Diagnosis not present

## 2017-12-29 DIAGNOSIS — F419 Anxiety disorder, unspecified: Secondary | ICD-10-CM | POA: Diagnosis present

## 2017-12-29 DIAGNOSIS — Z853 Personal history of malignant neoplasm of breast: Secondary | ICD-10-CM

## 2017-12-29 DIAGNOSIS — Z171 Estrogen receptor negative status [ER-]: Secondary | ICD-10-CM | POA: Diagnosis not present

## 2017-12-29 DIAGNOSIS — R0602 Shortness of breath: Secondary | ICD-10-CM | POA: Diagnosis present

## 2017-12-29 DIAGNOSIS — Y95 Nosocomial condition: Secondary | ICD-10-CM | POA: Diagnosis present

## 2017-12-29 DIAGNOSIS — M7989 Other specified soft tissue disorders: Secondary | ICD-10-CM | POA: Diagnosis not present

## 2017-12-29 HISTORY — DX: Personal history of other venous thrombosis and embolism: Z86.718

## 2017-12-29 HISTORY — PX: IR GUIDED DRAIN W CATHETER PLACEMENT: IMG719

## 2017-12-29 LAB — CBC WITH DIFFERENTIAL/PLATELET
BASOS ABS: 0 10*3/uL (ref 0.0–0.1)
BASOS PCT: 0 %
Eosinophils Absolute: 0 10*3/uL (ref 0.0–0.7)
Eosinophils Relative: 0 %
HEMATOCRIT: 29.4 % — AB (ref 36.0–46.0)
Hemoglobin: 9.3 g/dL — ABNORMAL LOW (ref 12.0–15.0)
LYMPHS PCT: 7 %
Lymphs Abs: 0.6 10*3/uL — ABNORMAL LOW (ref 0.7–4.0)
MCH: 26.3 pg (ref 26.0–34.0)
MCHC: 31.6 g/dL (ref 30.0–36.0)
MCV: 83.3 fL (ref 78.0–100.0)
MONO ABS: 0.5 10*3/uL (ref 0.1–1.0)
MONOS PCT: 6 %
NEUTROS ABS: 7.3 10*3/uL (ref 1.7–7.7)
NEUTROS PCT: 87 %
Platelets: 314 10*3/uL (ref 150–400)
RBC: 3.53 MIL/uL — ABNORMAL LOW (ref 3.87–5.11)
RDW: 20.3 % — ABNORMAL HIGH (ref 11.5–15.5)
WBC: 8.3 10*3/uL (ref 4.0–10.5)

## 2017-12-29 LAB — COMPREHENSIVE METABOLIC PANEL
ALBUMIN: 2.7 g/dL — AB (ref 3.5–5.0)
ALK PHOS: 289 U/L — AB (ref 38–126)
ALT: 30 U/L (ref 14–54)
ANION GAP: 12 (ref 5–15)
AST: 43 U/L — ABNORMAL HIGH (ref 15–41)
BUN: 7 mg/dL (ref 6–20)
CALCIUM: 8.6 mg/dL — AB (ref 8.9–10.3)
CHLORIDE: 101 mmol/L (ref 101–111)
CO2: 25 mmol/L (ref 22–32)
Creatinine, Ser: 0.61 mg/dL (ref 0.44–1.00)
GFR calc Af Amer: >60 mL/min (ref >60)
GFR calc non Af Amer: >60 mL/min (ref >60)
GLUCOSE: 181 mg/dL — AB (ref 65–99)
POTASSIUM: 3.1 mmol/L — AB (ref 3.5–5.1)
SODIUM: 138 mmol/L (ref 135–145)
Total Bilirubin: 0.6 mg/dL (ref 0.3–1.2)
Total Protein: 6.9 g/dL (ref 6.5–8.1)

## 2017-12-29 LAB — BASIC METABOLIC PANEL
Anion gap: 10 (ref 5–15)
BUN: 6 mg/dL (ref 6–20)
CHLORIDE: 103 mmol/L (ref 101–111)
CO2: 27 mmol/L (ref 22–32)
Calcium: 8.7 mg/dL — ABNORMAL LOW (ref 8.9–10.3)
Creatinine, Ser: 0.54 mg/dL (ref 0.44–1.00)
GFR calc Af Amer: >60 mL/min (ref >60)
GFR calc non Af Amer: >60 mL/min (ref >60)
Glucose, Bld: 126 mg/dL — ABNORMAL HIGH (ref 65–99)
POTASSIUM: 3.4 mmol/L — AB (ref 3.5–5.1)
Sodium: 140 mmol/L (ref 135–145)

## 2017-12-29 LAB — BRAIN NATRIURETIC PEPTIDE: B Natriuretic Peptide: 23.5 pg/mL (ref 0.0–100.0)

## 2017-12-29 LAB — CBC
HEMATOCRIT: 23 % — AB (ref 36.0–46.0)
Hemoglobin: 7.4 g/dL — ABNORMAL LOW (ref 12.0–15.0)
MCH: 26.5 pg (ref 26.0–34.0)
MCHC: 32.2 g/dL (ref 30.0–36.0)
MCV: 82.4 fL (ref 78.0–100.0)
Platelets: 296 10*3/uL (ref 150–400)
RBC: 2.79 MIL/uL — ABNORMAL LOW (ref 3.87–5.11)
RDW: 20.1 % — AB (ref 11.5–15.5)
WBC: 6.2 10*3/uL (ref 4.0–10.5)

## 2017-12-29 LAB — PROTIME-INR
INR: 1.93
INR: 1.4
PROTHROMBIN TIME: 21.9 s — AB (ref 11.4–15.2)
Prothrombin Time: 17 seconds — ABNORMAL HIGH (ref 11.4–15.2)

## 2017-12-29 LAB — INFLUENZA PANEL BY PCR (TYPE A & B)
INFLBPCR: NEGATIVE
Influenza A By PCR: NEGATIVE

## 2017-12-29 LAB — MRSA PCR SCREENING: MRSA by PCR: NEGATIVE

## 2017-12-29 LAB — I-STAT CG4 LACTIC ACID, ED: LACTIC ACID, VENOUS: 1.81 mmol/L (ref 0.5–1.9)

## 2017-12-29 LAB — PREPARE RBC (CROSSMATCH)

## 2017-12-29 MED ORDER — MIDAZOLAM HCL 2 MG/2ML IJ SOLN
INTRAMUSCULAR | Status: AC
  Filled 2017-12-29: qty 4

## 2017-12-29 MED ORDER — FENTANYL CITRATE (PF) 100 MCG/2ML IJ SOLN
INTRAMUSCULAR | Status: AC
  Filled 2017-12-29: qty 4

## 2017-12-29 MED ORDER — ALPRAZOLAM 0.5 MG PO TABS
0.5000 mg | ORAL_TABLET | Freq: Once | ORAL | Status: AC
  Administered 2017-12-29: 0.5 mg via ORAL
  Filled 2017-12-29: qty 1

## 2017-12-29 MED ORDER — ORAL CARE MOUTH RINSE
15.0000 mL | Freq: Two times a day (BID) | OROMUCOSAL | Status: DC
  Administered 2017-12-30 – 2018-01-05 (×5): 15 mL via OROMUCOSAL

## 2017-12-29 MED ORDER — ALBUTEROL SULFATE (2.5 MG/3ML) 0.083% IN NEBU
5.0000 mg | INHALATION_SOLUTION | Freq: Once | RESPIRATORY_TRACT | Status: AC
  Administered 2017-12-29: 5 mg via RESPIRATORY_TRACT
  Filled 2017-12-29: qty 6

## 2017-12-29 MED ORDER — ADULT MULTIVITAMIN W/MINERALS CH
1.0000 | ORAL_TABLET | Freq: Every day | ORAL | Status: DC
  Administered 2017-12-29 – 2018-01-07 (×10): 1 via ORAL
  Filled 2017-12-29 (×10): qty 1

## 2017-12-29 MED ORDER — ENSURE ENLIVE PO LIQD
237.0000 mL | Freq: Two times a day (BID) | ORAL | Status: DC
  Administered 2017-12-30 – 2018-01-01 (×4): 237 mL via ORAL

## 2017-12-29 MED ORDER — ALPRAZOLAM 0.5 MG PO TABS
0.5000 mg | ORAL_TABLET | Freq: Three times a day (TID) | ORAL | Status: DC | PRN
  Administered 2017-12-29 – 2017-12-31 (×6): 0.5 mg via ORAL
  Filled 2017-12-29 (×6): qty 1

## 2017-12-29 MED ORDER — MIDAZOLAM HCL 2 MG/2ML IJ SOLN
INTRAMUSCULAR | Status: AC | PRN
  Administered 2017-12-29 (×3): 1 mg via INTRAVENOUS

## 2017-12-29 MED ORDER — PANTOPRAZOLE SODIUM 40 MG PO TBEC
40.0000 mg | DELAYED_RELEASE_TABLET | Freq: Every day | ORAL | Status: DC
  Administered 2017-12-29 – 2018-01-07 (×10): 40 mg via ORAL
  Filled 2017-12-29 (×10): qty 1

## 2017-12-29 MED ORDER — CHLORHEXIDINE GLUCONATE 0.12 % MT SOLN
15.0000 mL | Freq: Two times a day (BID) | OROMUCOSAL | Status: DC
  Administered 2017-12-29 – 2018-01-04 (×10): 15 mL via OROMUCOSAL
  Filled 2017-12-29 (×14): qty 15

## 2017-12-29 MED ORDER — ACETAMINOPHEN 325 MG PO TABS
650.0000 mg | ORAL_TABLET | Freq: Four times a day (QID) | ORAL | Status: DC | PRN
  Administered 2017-12-30: 650 mg via ORAL
  Filled 2017-12-29: qty 2

## 2017-12-29 MED ORDER — ACETAMINOPHEN 650 MG RE SUPP: 650.0000 mg | Freq: Four times a day (QID) | RECTAL | Status: DC | PRN

## 2017-12-29 MED ORDER — NITROGLYCERIN 0.4 MG SL SUBL: 0.4000 mg | SUBLINGUAL_TABLET | SUBLINGUAL | Status: DC | PRN

## 2017-12-29 MED ORDER — CEFAZOLIN SODIUM-DEXTROSE 1-4 GM/50ML-% IV SOLN
INTRAVENOUS | Status: AC | PRN
  Administered 2017-12-29: 2 g via INTRAVENOUS

## 2017-12-29 MED ORDER — ONDANSETRON HCL 4 MG PO TABS: 4.0000 mg | ORAL_TABLET | Freq: Four times a day (QID) | ORAL | Status: DC | PRN

## 2017-12-29 MED ORDER — ONDANSETRON HCL 4 MG/2ML IJ SOLN: 4.0000 mg | Freq: Four times a day (QID) | INTRAMUSCULAR | Status: DC | PRN

## 2017-12-29 MED ORDER — FENTANYL CITRATE (PF) 100 MCG/2ML IJ SOLN
INTRAMUSCULAR | Status: AC | PRN
  Administered 2017-12-29 (×3): 50 ug via INTRAVENOUS

## 2017-12-29 MED ORDER — RIVAROXABAN 20 MG PO TABS
20.0000 mg | ORAL_TABLET | Freq: Every day | ORAL | Status: DC
  Administered 2017-12-30: 20 mg via ORAL
  Filled 2017-12-29: qty 1

## 2017-12-29 MED ORDER — PROCHLORPERAZINE MALEATE 10 MG PO TABS
10.0000 mg | ORAL_TABLET | Freq: Four times a day (QID) | ORAL | Status: DC | PRN
  Filled 2017-12-29: qty 1

## 2017-12-29 MED ORDER — AMLODIPINE BESYLATE 5 MG PO TABS
5.0000 mg | ORAL_TABLET | Freq: Every day | ORAL | Status: DC
  Administered 2017-12-29 – 2018-01-07 (×10): 5 mg via ORAL
  Filled 2017-12-29 (×10): qty 1

## 2017-12-29 MED ORDER — POTASSIUM CHLORIDE CRYS ER 20 MEQ PO TBCR
20.0000 meq | EXTENDED_RELEASE_TABLET | Freq: Every day | ORAL | Status: DC
  Administered 2017-12-29 – 2018-01-01 (×4): 20 meq via ORAL
  Filled 2017-12-29 (×4): qty 1

## 2017-12-29 MED ORDER — SODIUM CHLORIDE 0.9 % IV SOLN
Freq: Once | INTRAVENOUS | Status: AC
  Administered 2017-12-29: 14:00:00 via INTRAVENOUS

## 2017-12-29 MED ORDER — GABAPENTIN 300 MG PO CAPS
300.0000 mg | ORAL_CAPSULE | Freq: Two times a day (BID) | ORAL | Status: DC
  Administered 2017-12-29 – 2018-01-07 (×19): 300 mg via ORAL
  Filled 2017-12-29 (×19): qty 1

## 2017-12-29 MED ORDER — HEPARIN (PORCINE) IN NACL 100-0.45 UNIT/ML-% IJ SOLN: 1150.0000 [IU]/h | INTRAMUSCULAR | Status: DC

## 2017-12-29 MED ORDER — LIP MEDEX EX OINT
TOPICAL_OINTMENT | CUTANEOUS | Status: DC | PRN
  Administered 2017-12-29: 1 via TOPICAL
  Filled 2017-12-29 (×2): qty 7

## 2017-12-29 MED ORDER — OXYCODONE HCL 5 MG PO TABS
5.0000 mg | ORAL_TABLET | ORAL | Status: DC | PRN
  Administered 2017-12-30 – 2018-01-07 (×23): 10 mg via ORAL
  Filled 2017-12-29 (×23): qty 2

## 2017-12-29 MED ORDER — TRAZODONE HCL 50 MG PO TABS
100.0000 mg | ORAL_TABLET | Freq: Every evening | ORAL | Status: DC | PRN
  Administered 2017-12-31 – 2018-01-06 (×7): 100 mg via ORAL
  Filled 2017-12-29 (×7): qty 2

## 2017-12-29 MED ORDER — LIDOCAINE-EPINEPHRINE (PF) 2 %-1:200000 IJ SOLN
INTRAMUSCULAR | Status: AC
  Administered 2017-12-29: 19:00:00
  Filled 2017-12-29: qty 20

## 2017-12-29 MED ORDER — CEFAZOLIN SODIUM-DEXTROSE 2-4 GM/100ML-% IV SOLN
INTRAVENOUS | Status: AC
  Filled 2017-12-29: qty 100

## 2017-12-29 NOTE — Procedures (Signed)
  Pre-operative Diagnosis: Malignant left pleural effusion       Post-operative Diagnosis: Loculated malignant left pleural effusion   Indications: Difficulty breathing  Procedure: Placement of tunneled left pleural catheter.  Removed 800 ml of bloody fluid.  Stopped removing fluid due to patient's coughing.  Findings: Loculated left pleural effusion, bloody effusion  Complications: None     EBL: Minimal  Plan: Use pleural drain as needed.   Anticipate using drain once a day.  Concerned that the drain will stop draining adequate amounts of pleural fluid due to loculations.

## 2017-12-29 NOTE — Progress Notes (Signed)
Brief pharmacy note regarding anticoagulation:   Patient was on Xarelto PTA for UE DVT. In anticipation of need for Pleurx catheter placement, Xarelto held on admission (12/28/2017) and pharmacy asked to initiate heparin infusion. Due to patient already receiving dose of Xarelto yesterday, 12/28/2017, heparin infusion was scheduled to start today at 6pm (24 hours after last dose of Xarelto). In the interim, however, pt has already had tunneled pleural catheter placed (~ 5pm today). Was unable to reach attending physician despite multiple attempts to clarify anticoagulation post-procedure. Spoke to Forrest Moron, NP night coverage for [facility]-per NP, cancel order for heparin drip (and associated labs) and resume Xarelto 20mg  PO daily at supper as per home regimen on 12/30/2017 PM (recommendation for IR procedure with standard bleed risk). NP aware of documented removal of 829ml of bloody fluid during procedure. Note drop in Hgb today from 9.3 to 7.4, and patient ordered 1 unit PRBC transfusion earlier today. CBC has been ordered per NP for AM to re-evaluate. Attending physician to re-assess in AM.    Lindell Spar, PharmD, BCPS Pager: [phone] 12/29/2017 7:25 PM

## 2017-12-29 NOTE — Progress Notes (Addendum)
Initial Nutrition Assessment  DOCUMENTATION CODES:   Not applicable  INTERVENTION:   Ensure Enlive po BID, each supplement provides 350 kcal and 20 grams of protein  Magic cup BID with meals (lunch/dinner), each supplement provides 290 kcal and 9 grams of protein   NUTRITION DIAGNOSIS:   Increased nutrient needs related to cancer and cancer related treatments as evidenced by estimated needs.   GOAL:   Patient will meet greater than or equal to 90% of their needs   MONITOR:   PO intake, Supplement acceptance, Labs, Weight trends, I & O's, Diet advancement  REASON FOR ASSESSMENT:   Malnutrition Screening Tool    ASSESSMENT:   47 yo female admitted  3/18 with worsening dyspnea/pleural effusion, acute respiratory failure with hypoxia, s/p thoracenteses on 3/7 and 12/26/17,  HTN. History significant for renal cell carcinoma, breast cancer with bone/liver mets (Radiation ended in February, currently on immunotherapy). PMH DVT, anemia   3/18: er MD progress note, pt will continue on BiPAP and have Pleurx cath placed today  Spoke with RN this afternoon and confirmed that pt is currently on nasal cannula; goal is to keep her off of BiPAP.  Pt was sleepy and in pain at time of nutrition assessment visit. Pt's husband was in room and able to answer questions:  --Good appetite PTA; had good appetite prior to NPO in hospital this morning --Drinks strawberry muscle milk at home ~1/day --No N/V/D PTA  Per husband requested deferment of NFPE; he did not want to disturb pt since she had not slept all night and is currently in pain. Visual assessment of pt did not indicate any depletions in the face or arms; however, NFPE will need to be completed at follow-up.   Medications-- protonix, KCl  Labs-- Na 140 wnl, K 3.4 L, Hgb 7.4 L, Hct 23 L  Per husband, pt lost weight but it has remained stable for past month Weight hx in medical record in agreement with husband's recall. Pt  experienced significant weight loss of ~11% over past 4 months. Weight stable 139-143 over past month. Wt Readings from Last 15 Encounters:  12/29/17 141 lb 15.6 oz (64.4 kg)  12/24/17 142 lb 3.2 oz (64.5 kg)  12/18/17 138 lb 14.4 oz (63 kg)  12/09/17 139 lb 1.6 oz (63.1 kg)  12/06/17 143 lb 15.4 oz (65.3 kg)  12/03/17 146 lb 12.8 oz (66.6 kg)  11/26/17 150 lb 9.6 oz (68.3 kg)  11/18/17 146 lb 14.4 oz (66.6 kg)  11/13/17 149 lb 8 oz (67.8 kg)  10/31/17 150 lb 4 oz (68.2 kg)  10/24/17 153 lb 6.4 oz (69.6 kg)  10/23/17 152 lb 9.6 oz (69.2 kg)  10/03/17 153 lb 11.2 oz (69.7 kg)  09/19/17 154 lb 9.6 oz (70.1 kg)  09/12/17 159 lb 3.2 oz (72.2 kg)    Diet Order:  Diet NPO time specified Except for: Sips with Meds  EDUCATION NEEDS:   No education needs have been identified at this time  Skin:  Skin Assessment: Reviewed RN Assessment  Last BM:  None noted; per husband, pt is "regular"  Height:   Ht Readings from Last 1 Encounters:  12/29/17 5\' 5"  (1.651 m)    Weight:   Wt Readings from Last 1 Encounters:  12/29/17 141 lb 15.6 oz (64.4 kg)    Ideal Body Weight:  56.8 kg  BMI:  Body mass index is 23.63 kg/m.  Estimated Nutritional Needs:   Kcal:  2125-2254 (33-35 kcal/kg)  Protein:  96-106  grams (average 1.5 g/kg and 20% kcal)  Fluid:  >=2.1 L/day    Edmonia Lynch Dietetic Intern Pager: [phone]

## 2017-12-29 NOTE — Progress Notes (Signed)
ANTICOAGULATION CONSULT NOTE - Initial Consult  Pharmacy Consult for Heparin (bridge rivaroxaban) Indication: DVT  Allergies  Allergen Reactions  . Sulfa Antibiotics Itching    Vaginal bleeding    Patient Measurements: Height: 5' 5.5" (166.4 cm) Weight: 140 lb (63.5 kg) IBW/kg (Calculated) : 58.15 Heparin Dosing Weight:   Vital Signs: Temp: 98.7 F (37.1 C) (03/18 0058) Temp Source: Axillary (03/18 0058) BP: 129/103 (03/18 0330) Pulse Rate: 118 (03/18 0330)  Labs: Recent Labs    12/29/17 0105  HGB 9.3*  HCT 29.4*  PLT 314  LABPROT 21.9*  INR 1.93  CREATININE 0.61    Estimated Creatinine Clearance: 79 mL/min (by C-G formula based on SCr of 0.61 mg/dL).   Medical History: Past Medical History:  Diagnosis Date  . Anemia    during pregnancy  . Breast cancer of upper-outer quadrant of right female breast ([place]) 01/27/2015  . Depression    hx denies any problems now  . History of kidney cancer 12/2015   left removed "spot"  . Lymphedema    rt arm  . Neuropathy   . Sickle cell trait ([facility])     Medications:   (Not in a hospital admission) Infusions:  . heparin      Assessment: Patient with history of DVT on rivaroxaban 20mg  qd, with last dose noted 3/17 at 1800.  PTT ordered with Heparin level until both correlate due to possible drug-lab interaction between oral anticoagulant (rivaroxaban, edoxaban, or apixaban) and anti-Xa level (aka heparin level)   Goal of Therapy:  Heparin level 0.3-0.7 units/ml aPTT 66-102 seconds Monitor platelets by anticoagulation protocol: Yes   Plan:  No heparin bolus Heparin drip at 1150 units/hr starting at 1800 3/18. Heparin level and PTT at 0300 3/19  Tyler Deis, Caroleen Crowford 12/29/2017,4:02 AM

## 2017-12-29 NOTE — Consult Note (Addendum)
.. ..  Name: Phyllis Pruitt MRN: [id number] DOB: November 08, 1970    ADMISSION DATE:  12/29/2017 CONSULTATION DATE:  12/29/17  REFERRING MD :  Christy Gentles MD  CHIEF COMPLAINT:  Shortness of Breath  BRIEF PATIENT DESCRIPTION: 47 yr old female with known history of metastatic breast CA on palliative chemo with recurrent metastatic effusions previously offered Pleurx and refused presents with dyspnea secondary to effusion  SIGNIFICANT EVENTS  Shortness of breath  STUDIES:  CXR   HISTORY OF PRESENT ILLNESS:  ( History gathered from patient, EMR and other providers' accts) 47 yr old female with PMHx of Right breast invasive ductal carcinoma multifocal, 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 Last seen by Oncology on 3/13 Completed Palliative radiation in February 2019. And is on Pembrolizumab immunotherapy. She continues on Xarelto. 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 Per ED documentation she presented with O2 saturations in 80s and feeling like she could not breathe, anxious.  Started on supplemental ocygen her work of breathing improved and she was not hypoxic. Started on NIPPV. RR decreased pt in no distress. Husband at bedside O2 sat 100%. PCCM consulted   PAST MEDICAL HISTORY :   has a past medical history of Anemia, Breast cancer of upper-outer quadrant of right female breast ([place]) (01/27/2015), Depression, History of kidney cancer (12/2015), Lymphedema, Neuropathy, and Sickle cell trait ([place]).  has a past surgical history that includes Tubal ligation; Tonsillectomy; wisdome teeth extraction; Portacath placement (N/A, 02/10/2015); Mastectomy modified radical (Right, 07/28/2015); Port-a-cath removal (07/28/2015); Mastectomy modified radical (Right, 07/28/2015); Port-a-cath removal (07/28/2015); Robotic assited partial nephrectomy (Left, 12/29/2015); Mastectomy; Latissimus flap to breast (Right,  06/03/2016); Tissue expander placement (Right, 06/03/2016); Removal of tissue expander and placement of implant (Right, 09/20/2016); Liposuction with lipofilling (Right, 09/20/2016); and Portacath placement (N/A, 04/04/2017). Prior to Admission medications   Medication Sig Start Date End Date Taking? Authorizing Provider  amLODipine (NORVASC) 5 MG tablet Take 1 tablet (5 mg total) by mouth daily. 12/07/17   Benito Mccreedy, MD  amoxicillin-clavulanate (AUGMENTIN) 875-125 MG tablet Take 1 tablet by mouth 2 (two) times daily. 12/18/17   Tanner, Lyndon Code., PA-C  Black Pepper-Turmeric 3-500 MG CAPS Take 1 capsule by mouth daily. 05/21/16   Nicholas Lose, MD  gabapentin (NEURONTIN) 300 MG capsule Take 1 capsule (300 mg total) by mouth 3 (three) times daily. Patient taking differently: Take 300 mg by mouth 2 (two) times daily.  09/12/17   Nicholas Lose, MD  lidocaine-prilocaine (EMLA) cream Apply to affected area once 11/26/17   Nicholas Lose, MD  Multiple Vitamins-Minerals (MULTIVITAMIN) tablet Take 1 tablet by mouth daily. 05/21/16   Nicholas Lose, MD  nitroGLYCERIN (NITROSTAT) 0.4 MG SL tablet Place 1 tablet (0.4 mg total) under the tongue every 5 (five) minutes as needed for chest pain (call MD if not better after 2 doses). 12/07/17   Benito Mccreedy, MD  oxyCODONE (OXY IR/ROXICODONE) 5 MG immediate release tablet Take 1-2 tablets (5-10 mg total) by mouth every 4 (four) hours as needed for severe pain. 12/24/17   Nicholas Lose, MD  pantoprazole (PROTONIX) 40 MG tablet Take 1 tablet (40 mg total) by mouth daily. 12/07/17   Osei-Bonsu, Iona Beard, MD  potassium chloride SA (K-DUR,KLOR-CON) 20 MEQ tablet TAKE 1 TABLET(20 MEQ) BY MOUTH DAILY 10/09/17   Nicholas Lose, MD  Probiotic Product (PROBIOTIC PO) Take 1 tablet by mouth daily.    [provider]  prochlorperazine (  COMPAZINE) 10 MG tablet Take 1 tablet (10 mg total) by mouth every 6 (six) hours as needed for nausea or vomiting. 11/21/17   Kyung Rudd, MD    rivaroxaban (XARELTO) 20 MG TABS tablet Take 1 tablet (20 mg total) by mouth daily with supper. 09/08/17   Nicholas Lose, MD  traZODone (DESYREL) 100 MG tablet Take 1 tablet (100 mg total) by mouth at bedtime as needed for sleep. 12/07/17   Benito Mccreedy, MD   Allergies  Allergen Reactions  . Sulfa Antibiotics Itching    Vaginal bleeding    FAMILY HISTORY:  family history is not on file. SOCIAL HISTORY:  reports that  has never smoked. she has never used smokeless tobacco. She reports that she does not drink alcohol or use drugs.  REVIEW OF SYSTEMS:   Constitutional: Negative for fever, chills, weight loss, malaise/fatigue and diaphoresis.  HENT: Negative for hearing loss, ear pain, nosebleeds, congestion, sore throat, neck pain, tinnitus and ear discharge.   Eyes: Negative for blurred vision, double vision, photophobia, pain, discharge and redness.  Respiratory: Negative for cough, hemoptysis, sputum production, shortness of breath, wheezing and stridor.   Cardiovascular: Negative for chest pain, palpitations, orthopnea, claudication, leg swelling and PND.  Gastrointestinal: Negative for heartburn, nausea, vomiting, abdominal pain, diarrhea, constipation, blood in stool and melena.  Genitourinary: Negative for dysuria, urgency, frequency, hematuria and flank pain.  Musculoskeletal: Negative for myalgias, back pain, joint pain and falls.  Skin: Negative for itching and rash.  Neurological: Negative for dizziness, tingling, tremors, sensory change, speech change, focal weakness, seizures, loss of consciousness, weakness and headaches.  Endo/Heme/Allergies: Negative for environmental allergies and polydipsia. Does not bruise/bleed easily.  SUBJECTIVE:   VITAL SIGNS: Temp:  [98.7 F (37.1 C)] 98.7 F (37.1 C) (03/18 0058) Pulse Rate:  [136-137] 136 (03/18 0150) Resp:  [39-43] 39 (03/18 0150) BP: (141-164)/(95-100) 141/100 (03/18 0150) SpO2:  [100 %] 100 % (03/18 0150) Weight:   [63.5 kg (140 lb)-64.4 kg (142 lb)] 63.5 kg (140 lb) (03/18 0058)  PHYSICAL EXAMINATION: General:  In no acute distress Neuro:  AAOx4 no focal deficits HEENT:  NCAT with NIPPV mask on no leak Cardiovascular:  S1 and S23 appreciated no rub or gallop appreciated Lungs:  Coarse bilateral sounds decreased on left with + crackles Abdomen:  Soft, non-distended + BS Musculoskeletal: swollen right arm unable to extend or lift Skin:  Grossly intact  Recent Labs  Lab 12/24/17 1402  NA 140  K 3.1*  CL 105  CO2 24  BUN 6*  CREATININE 0.68  GLUCOSE 82   Recent Labs  Lab 12/24/17 1402  HCT 27.8*  WBC 4.3  PLT 234   Dg Chest Port 1 View  Result Date: 12/29/2017 CLINICAL DATA:  Dyspnea EXAM: PORTABLE CHEST 1 VIEW COMPARISON:  12/26/2017 FINDINGS: Larger left-sided loculated pleural effusion. Mild interstitial edema, slightly increased in appearance. Stable cardiac and mediastinal contours allowing for partial obscuration due to adjacent loculated left effusion. Left subclavian port catheter tip terminates at the left innominate SVC juncture, unchanged in appearance. Postop change right breast and axilla. IMPRESSION: Larger loculated left-sided pleural effusion with slight increase in mild interstitial edema. Electronically Signed   By: Ashley Royalty M.D.   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  Needs evaluation by IR for PleurX catheter. Much safer than repeated thoracentesis (complications of hemothorax and pneumothorax increase with each time procedure is performed) I discussed PleurX with patient and husband and they are amenable to  it.   Reviewed last CT chest - sternal metastatic disease and possible chest wall involvement but no active PE Hemodynamically stable and tolerating NIPPV. Wean off once effusion has been addressed  Pt is young but Goals of Care conversation is needed. Oncologist has initiated this on last visit.  Spoke with EDP regarding plan and they agree.    I, Dr Seward Carol have personally reviewed patient's available data, including medical history, events of note, physical examination and test results as part of my evaluation. I have discussed with NP and other care providers such as pharmacist, RN and Elink. The patient is critically ill with multiple organ systems failure and requires high complexity decision making for assessment and support, frequent evaluation and titration of therapies, application of advanced monitoring technologies and extensive interpretation of multiple databases. Critical Care Time devoted to patient care services described in this note is 45 Minutes. This time reflects time of care of this signee Dr Seward Carol. This critical care time does not reflect procedure time, or teaching time or supervisory time of NP but could involve care discussion time    DISPOSITION: Admit to SD under Hospitalist with Consult to IR for Pleurx CC TIME: 45 mins PROGNOSIS: POOR. Needs GOC discussion.  FAMILY: husband at bedside CODE STATUS: Full   Signed Dr Seward Carol Pulmonary Critical [facility] [place]   12/29/2017, 2:10 AM

## 2017-12-29 NOTE — Progress Notes (Addendum)
[facility] PROGRESS NOTE    Progress Note  Phyllis Pruitt  OEU:[id number] DOB: 06/16/1971 DOA: 12/29/2017 PCP: [name], MD     Brief Narrative:   Phyllis Pruitt is an 47 y.o. female past medical history of breast cancer had thoracentesis 3 days prior to admission in 1 week prior to admission due to recurrent pleural effusions presents to the ED with worsening shortness of breath over the last 36 hours with a productive cough.  Assessment/Plan:   Acute respiratory failure with hypoxia (HCC)/  Pleural effusion on left: On BiPAP saturating greater than 90%, the patient agreed to have Pleurx catheter placed. Pulmonary and critical care was consulted who agree with management, will transition her from Xarelto to IV heparin in anticipation of procedure. Consult IR for Pleurx catheter insertion critical care has agreed. As per nurse we will try to wean her off the BiPAP twice and they have been unsuccessful.  Essential hypertension: Continue amlodipine.  History of DVT: Held Xarelto continue heparin.  Breast cancer of upper-outer quadrant of right female breast [facility]): Follow-up with oncology.  Normocytic anemia: With a drop in hemoglobin from 9-7.4 overnight, no bloody or melanotic bowel movements. She is currently also short of breath, FOBT stools and transfuse 1 unit of packed red blood cells check a CBC tomorrow morning. Question if this drop in hemoglobin she might having a hemothorax, probably malignant in nature in the setting of metastatic breast cancer, she denies any trauma or falls.   DVT prophylaxis: heparin Family Communication:none Disposition Plan/Barrier to D/C: home once respiratory status improved. Code Status:     Code Status Orders  (From admission, onward)        Start     Ordered   12/29/17 0346  Full code  Continuous     12/29/17 0346    Code Status History    Date Active Date Inactive Code Status Order ID Comments User Context   12/06/2017 02:08 12/07/2017 16:35 Full Code [id number]  Etta Quill, DO ED   06/03/2016 13:36 06/04/2016 17:13 Full Code [id number]  Irene Limbo, MD Inpatient   07/28/2015 15:20 07/29/2015 14:19 Full Code [id number]  Alphonsa Overall, MD Inpatient        IV Access:    Peripheral IV   Procedures and diagnostic studies:   Dg Chest Port 1 View  Result Date: 12/29/2017 CLINICAL DATA:  Dyspnea EXAM: PORTABLE CHEST 1 VIEW COMPARISON:  12/26/2017 FINDINGS: Larger left-sided loculated pleural effusion. Mild interstitial edema, slightly increased in appearance. Stable cardiac and mediastinal contours allowing for partial obscuration due to adjacent loculated left effusion. Left subclavian port catheter tip terminates at the left innominate SVC juncture, unchanged in appearance. Postop change right breast and axilla. IMPRESSION: Larger loculated left-sided pleural effusion with slight increase in mild interstitial edema. Electronically Signed   By: Ashley Royalty M.D.   On: 12/29/2017 01:59     Medical Consultants:    None.  Anti-Infectives:   none  Subjective:    Phyllis Pruitt she relates her breathing is not better, nurse could not take her off BiPAP and she desats.  Objective:    Vitals:   12/29/17 0500 12/29/17 0515 12/29/17 0530 12/29/17 0545  BP: (!) 151/106 (!) 138/100 (!) 129/96 132/90  Pulse: (!) 129 (!) 122 (!) 120 (!) 120  Resp:      Temp: (!) 97.5 F (36.4 C)     TempSrc: Axillary     SpO2: 100% 100% 99% 100%  Weight: 64.4  kg (141 lb 15.6 oz)     Height: 5\' 5"  (1.651 m)      No intake or output data in the 24 hours ending 12/29/17 0734 Filed Weights   12/29/17 0053 12/29/17 0058 12/29/17 0500  Weight: 64.4 kg (142 lb) 63.5 kg (140 lb) 64.4 kg (141 lb 15.6 oz)    Exam: General exam: In no acute distress. Respiratory system: good air movement with no sounds on the left Cardiovascular system: S1 & S2 heard, RRR. N Gastrointestinal system: Abdomen is  nondistended, soft and nontender.  Central nervous system: Alert and oriented. No focal neurological deficits. Extremities: No pedal edema. Skin: No rashes, lesions or ulcers Psychiatry: Judgement and insight appear normal. Mood & affect appropriate.    Data Reviewed:    Labs: Basic Metabolic Panel: Recent Labs  Lab 12/24/17 1402 12/29/17 0105 12/29/17 0616  NA 140 138 140  K 3.1* 3.1* 3.4*  CL 105 101 103  CO2 24 25 27   GLUCOSE 82 181* 126*  BUN 6* 7 6  CREATININE 0.68 0.61 0.54  CALCIUM 9.3 8.6* 8.7*   GFR Estimated Creatinine Clearance: 77.4 mL/min (by C-G formula based on SCr of 0.54 mg/dL). Liver Function Tests: Recent Labs  Lab 12/24/17 1402 12/29/17 0105  AST 39* 43*  ALT 26 30  ALKPHOS 311* 289*  BILITOT 0.3 0.6  PROT 7.2 6.9  ALBUMIN 2.6* 2.7*   No results for input(s): LIPASE, AMYLASE in the last 168 hours. No results for input(s): AMMONIA in the last 168 hours. Coagulation profile Recent Labs  Lab 12/29/17 0105  INR 1.93    CBC: Recent Labs  Lab 12/24/17 1402 12/29/17 0105 12/29/17 0616  WBC 4.3 8.3 6.2  NEUTROABS 3.4 7.3  --   HGB  --  9.3* 7.4*  HCT 27.8* 29.4* 23.0*  MCV 81.9 83.3 82.4  PLT 234 314 296   Cardiac Enzymes: No results for input(s): CKTOTAL, CKMB, CKMBINDEX, TROPONINI in the last 168 hours. BNP (last 3 results) No results for input(s): PROBNP in the last 8760 hours. CBG: No results for input(s): GLUCAP in the last 168 hours. D-Dimer: No results for input(s): DDIMER in the last 72 hours. Hgb A1c: No results for input(s): HGBA1C in the last 72 hours. Lipid Profile: No results for input(s): CHOL, HDL, LDLCALC, TRIG, CHOLHDL, LDLDIRECT in the last 72 hours. Thyroid function studies: No results for input(s): TSH, T4TOTAL, T3FREE, THYROIDAB in the last 72 hours.  Invalid input(s): FREET3 Anemia work up: No results for input(s): VITAMINB12, FOLATE, FERRITIN, TIBC, IRON, RETICCTPCT in the last 72 hours. Sepsis  Labs: Recent Labs  Lab 12/24/17 1402 12/29/17 0105 12/29/17 0153 12/29/17 0616  WBC 4.3 8.3  --  6.2  LATICACIDVEN  --   --  1.81  --    Microbiology No results found for this or any previous visit (from the past 240 hour(s)).   Medications:   . amLODipine  5 mg Oral Daily  . chlorhexidine  15 mL Mouth Rinse BID  . gabapentin  300 mg Oral BID  . mouth rinse  15 mL Mouth Rinse q12n4p  . multivitamin with minerals  1 tablet Oral Daily  . pantoprazole  40 mg Oral Daily  . potassium chloride SA  20 mEq Oral Daily   Continuous Infusions: . heparin        LOS: 0 days   Charlynne Cousins  [facility] Pager [phone]  *Please refer to [email or web address], password [email or web address] to get updated schedule on who  will round on this patient, as hospitalists switch teams weekly. If 7PM-7AM, please contact night-coverage at [email or web address], password [email or web address] for any overnight needs.  12/29/2017, 7:34 AM

## 2017-12-29 NOTE — ED Provider Notes (Signed)
[facility] DEPT Provider Note   CSN: [id number] Arrival date & time: 12/29/17  0049     History   Chief Complaint Chief Complaint  Patient presents with  . Shortness of Breath   Level 5 Caveat due to acuity of condition HPI Phyllis Pruitt is a 47 y.o. female.  The history is provided by the patient. The history is limited by the condition of the patient.  Shortness of Breath  This is a new problem. The problem occurs continuously.The problem has been rapidly worsening. Associated symptoms include cough.  Patient with history of breast cancer, malignant pleural effusion presents with shortness of breath.  She reports rapidly worsening shortness of breath for the past several hours.  She reports chest tightness.  EMS was called due to shortness of breath.  On their arrival her O2 sats were in the 19s.  She began to improve with oxygen.  No other details are known at this time  Past Medical History:  Diagnosis Date  . Anemia    during pregnancy  . Breast cancer of upper-outer quadrant of right female breast ([place]) 01/27/2015  . Depression    hx denies any problems now  . History of kidney cancer 12/2015   left removed "spot"  . Lymphedema    rt arm  . Neuropathy   . Sickle cell trait [facility])     Patient Active Problem List   Diagnosis Date Noted  . Esophageal spasm 12/06/2017  . Bone metastases ([place]) 11/18/2017  . Liver metastases ([place]) 11/18/2017  . Port-A-Cath in place 11/13/2017  . Acquired absence of breast and nipple 06/03/2016  . Renal cell carcinoma of left kidney ([place]) 02/06/2016  . Left ovarian cyst 01/15/2016  . Renal mass 12/29/2015  . 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 Genetic testing 03/07/2015  . Left renal mass 02/17/2015  . Breast cancer of upper-outer quadrant of right female breast ([place]) 01/27/2015    Past Surgical History:  Procedure Laterality Date  . LATISSIMUS FLAP TO BREAST Right  06/03/2016   Procedure: RIGHT LATISSIMUS FLAP TO BREAST;  Surgeon: Irene Limbo, MD;  Location: [place];  Service: Plastics;  Laterality: Right;  . LIPOSUCTION WITH LIPOFILLING Right 09/20/2016   Procedure: LIPOSUCTION WITH LIPOFILLING;  Surgeon: Irene Limbo, MD;  Location: [place];  Service: Plastics;  Laterality: Right;  LIPOSUCTION WITH LIPOFILLING  . MASTECTOMY    . MASTECTOMY MODIFIED RADICAL Right 07/28/2015  . MASTECTOMY MODIFIED RADICAL Right 07/28/2015   Procedure: RIGHT MODIFIED RADICAL MASTECTOMY;  Surgeon: Alphonsa Overall, MD;  Location: [place];  Service: General;  Laterality: Right;  . PORT-A-CATH REMOVAL  07/28/2015  . PORT-A-CATH REMOVAL  07/28/2015   Procedure: REMOVAL PORT-A-CATH;  Surgeon: Alphonsa Overall, MD;  Location: [place];  Service: General;;  . PORTACATH PLACEMENT N/A 02/10/2015   Procedure: INSERTION PORT-A-CATH WITH ULTRA SOUND, left subclavian,;  Surgeon: Alphonsa Overall, MD;  [facility];  Service: General;  Laterality: N/A;  . PORTACATH PLACEMENT N/A 04/04/2017   Procedure: POWER PORT PLACEMENT;  Surgeon: Alphonsa Overall, MD;  [facility];  Service: General;  Laterality: N/A;  . REMOVAL OF TISSUE EXPANDER AND PLACEMENT OF IMPLANT Right 09/20/2016   Procedure: REMOVAL OF RIGHT TISSUE EXPANDER WITH PLACEMENT OF RIGHT SILICONE BREAST IMPLANTS LIPOFILLING FROM ABDOMEN TO RIGHT CHEST;  Surgeon: Irene Limbo, MD;  Location: [place];  Service: Plastics;  Laterality: Right;  PLACEMENT OF RIGHT SILICONE BREAST IMPLANTS LIPOFILLING FROM ABDOMEN TO RIGHT CHEST  .  ROBOTIC ASSITED PARTIAL NEPHRECTOMY Left 12/29/2015   Procedure: XI ROBOTIC ASSITED PARTIAL NEPHRECTOMY;  Surgeon: Cleon Gustin, MD;  [facility];  Service: Urology;  Laterality: Left;  . TISSUE EXPANDER PLACEMENT Right 06/03/2016   Procedure: PLACEMENT RIGHT TISSUE EXPANDER;  Surgeon: Irene Limbo, MD;  Location: [place];  Service: Plastics;  Laterality: Right;  .  TONSILLECTOMY    . TUBAL LIGATION    . wisdome teeth extraction      OB History    Gravida Para Term Preterm AB Living   1             SAB TAB Ectopic Multiple Live Births                   Home Medications    Prior to Admission medications   Medication Sig Start Date End Date Taking? Authorizing Provider  amLODipine (NORVASC) 5 MG tablet Take 1 tablet (5 mg total) by mouth daily. 12/07/17   Benito Mccreedy, MD  amoxicillin-clavulanate (AUGMENTIN) 875-125 MG tablet Take 1 tablet by mouth 2 (two) times daily. 12/18/17   Tanner, Lyndon Code., PA-C  Black Pepper-Turmeric 3-500 MG CAPS Take 1 capsule by mouth daily. 05/21/16   Nicholas Lose, MD  gabapentin (NEURONTIN) 300 MG capsule Take 1 capsule (300 mg total) by mouth 3 (three) times daily. Patient taking differently: Take 300 mg by mouth 2 (two) times daily.  09/12/17   Nicholas Lose, MD  lidocaine-prilocaine (EMLA) cream Apply to affected area once 11/26/17   Nicholas Lose, MD  Multiple Vitamins-Minerals (MULTIVITAMIN) tablet Take 1 tablet by mouth daily. 05/21/16   Nicholas Lose, MD  nitroGLYCERIN (NITROSTAT) 0.4 MG SL tablet Place 1 tablet (0.4 mg total) under the tongue every 5 (five) minutes as needed for chest pain (call MD if not better after 2 doses). 12/07/17   Benito Mccreedy, MD  oxyCODONE (OXY IR/ROXICODONE) 5 MG immediate release tablet Take 1-2 tablets (5-10 mg total) by mouth every 4 (four) hours as needed for severe pain. 12/24/17   Nicholas Lose, MD  pantoprazole (PROTONIX) 40 MG tablet Take 1 tablet (40 mg total) by mouth daily. 12/07/17   Osei-Bonsu, Iona Beard, MD  potassium chloride SA (K-DUR,KLOR-CON) 20 MEQ tablet TAKE 1 TABLET(20 MEQ) BY MOUTH DAILY 10/09/17   Nicholas Lose, MD  Probiotic Product (PROBIOTIC PO) Take 1 tablet by mouth daily.    [provider]  prochlorperazine (COMPAZINE) 10 MG tablet Take 1 tablet (10 mg total) by mouth every 6 (six) hours as needed for nausea or vomiting. 11/21/17   Kyung Rudd, MD    rivaroxaban (XARELTO) 20 MG TABS tablet Take 1 tablet (20 mg total) by mouth daily with supper. 09/08/17   Nicholas Lose, MD  traZODone (DESYREL) 100 MG tablet Take 1 tablet (100 mg total) by mouth at bedtime as needed for sleep. 12/07/17   Benito Mccreedy, MD    Family History History reviewed. No pertinent family history.  Social History Social History   Tobacco Use  . Smoking status: Never Smoker  . Smokeless tobacco: Never Used  Substance Use Topics  . Alcohol use: No  . Drug use: No     Allergies   Sulfa antibiotics   Review of Systems Review of Systems  Unable to perform ROS: Acuity of condition  Respiratory: Positive for cough and shortness of breath.      Physical Exam Updated Vital Signs BP (!) 164/95 (BP Location: Left Arm)   Pulse (!) 137   Temp 98.7 F (  37.1 C) (Axillary)   Resp (!) 43   Ht 1.664 m (5' 5.5")   Wt 63.5 kg (140 lb)   SpO2 100%   BMI 22.94 kg/m   Physical Exam CONSTITUTIONAL: Anxious and ill-appearing HEAD: Normocephalic/atraumatic EYES: EOMI/PERRL ENMT: Mucous membranes moist NECK: supple no meningeal signs SPINE/BACK:entire spine nontender CV: S1/S2 noted, tachycardic LUNGS: Tachypnea, decreased breath sounds in the left. ABDOMEN: soft, nontender, no rebound or guarding, bowel sounds noted throughout abdomen NEURO: Pt is awake/alert/appropriate, moves all extremitiesx4.  No facial droop.   EXTREMITIES: pulses normal/equal, full ROM SKIN: warm, color normal PSYCH: Anxious  ED Treatments / Results  Labs (all labs ordered are listed, but only abnormal results are displayed) Labs Reviewed  COMPREHENSIVE METABOLIC PANEL - Abnormal; Notable for the following components:      Result Value   Potassium 3.1 (*)    Glucose, Bld 181 (*)    Calcium 8.6 (*)    Albumin 2.7 (*)    AST 43 (*)    Alkaline Phosphatase 289 (*)    All other components within normal limits  CBC WITH DIFFERENTIAL/PLATELET - Abnormal; Notable for the  following components:   RBC 3.53 (*)    Hemoglobin 9.3 (*)    HCT 29.4 (*)    RDW 20.3 (*)    Lymphs Abs 0.6 (*)    All other components within normal limits  PROTIME-INR - Abnormal; Notable for the following components:   Prothrombin Time 21.9 (*)    All other components within normal limits  INFLUENZA PANEL BY PCR (TYPE A & B)  BRAIN NATRIURETIC PEPTIDE  I-STAT CG4 LACTIC ACID, ED    EKG  EKG Interpretation  Date/Time:  Monday December 29 2017 00:56:14 EDT Ventricular Rate:  150 PR Interval:    QRS Duration: 79 QT Interval:  308 QTC Calculation: 487 R Axis:   52 Text Interpretation:  Sinus tachycardia Multiple premature complexes, vent & supraven Aberrant conduction of SV complex(es) Anteroseptal infarct, age indeterminate Interpretation limited secondary to artifact Confirmed by Ripley Fraise ([id number]) on 12/29/2017 1:03:51 AM       Radiology Dg Chest Port 1 View  Result Date: 12/29/2017 CLINICAL DATA:  Dyspnea EXAM: PORTABLE CHEST 1 VIEW COMPARISON:  12/26/2017 FINDINGS: Larger left-sided loculated pleural effusion. Mild interstitial edema, slightly increased in appearance. Stable cardiac and mediastinal contours allowing for partial obscuration due to adjacent loculated left effusion. Left subclavian port catheter tip terminates at the left innominate SVC juncture, unchanged in appearance. Postop change right breast and axilla. IMPRESSION: Larger loculated left-sided pleural effusion with slight increase in mild interstitial edema. Electronically Signed   By: Ashley Royalty M.D.   On: 12/29/2017 01:59    Procedures Procedures  CRITICAL CARE Performed by: Sharyon Cable Total critical care time: 35 minutes Critical care time was exclusive of separately billable procedures and treating other patients. Critical care was necessary to treat or prevent imminent or life-threatening deterioration. Critical care was time spent personally by me on the following activities:  development of treatment plan with patient and/or surrogate as well as nursing, discussions with consultants, evaluation of patient's response to treatment, examination of patient, obtaining history from patient or surrogate, ordering and performing treatments and interventions, ordering and review of laboratory studies, ordering and review of radiographic studies, pulse oximetry and re-evaluation of patient's condition. Patient with respiratory failure requiring BiPAP, monitoring, stepdown admission  Medications Ordered in ED Medications  albuterol (PROVENTIL) (2.5 MG/3ML) 0.083% nebulizer solution 5 mg (5 mg Nebulization Given  12/29/17 0109)     Initial Impression / Assessment and Plan / ED Course  I have reviewed the triage vital signs and the nursing notes.  Pertinent labs & imaging results that were available during my care of the patient were reviewed by me and considered in my medical decision making (see chart for details).     1:15 AM Patient with history of metastatic breast CA, malignant pleural effusion presents with shortness of breath.  She just recently had a thoracentesis on the left.  Will need to have repeat chest x-ray.  Her oxygen sats have improved.  Will follow closely 1:44 AM I have reviewed the chest x-ray and she has a reaccumulation of the left pleural effusion Her work of breathing is somewhat improved.  She is awake and alert.  No hypoxia. I discussed the case with critical care.  We will trial her on BiPAP.  She will be evaluated for ICU care.  She would need to have another thoracentesis or Pleurx placement She is on Xarelto and took her nightly dose. Will follow closely 3:09 AM Patient improved on BiPAP.  She has been seen by critical care, and since she is improving she will be admitted to the hospitalist service She will likely need Pleurx catheter placement later today. Discussed with Dr. Hal Hope for admission.  Updated family on care plan Final  Clinical Impressions(s) / ED Diagnoses   Final diagnoses:  Pleural effusion on left  Acute respiratory failure with hypoxia [facility])    ED Discharge Orders    None       Ripley Fraise, MD 12/29/17 [phone]

## 2017-12-29 NOTE — Progress Notes (Signed)
PharmD paged NP re: anticoagulation for pt. Original plan was to start Heparin IV tonight in anticipation of IR placement of Pleurx catheter. However, pt had Pleurx placed today. On Xarelto for DVT at home. Per PharmD, standard of care is to restart the Xarelto tomorrow evening. Pt received no Heparin today due receiving Xarelto at home day before.   KJKG, NP triad

## 2017-12-29 NOTE — H&P (Signed)
History and Physical    Phyllis Pruitt EXB:[id number] DOB: Feb 17, 1971 DOA: 12/29/2017  PCP: [name], MD  Patient coming from: Home.  Chief Complaint: Difficulty breathing.  HPI: Phyllis Pruitt is a 47 y.o. female with history of breast cancer who has had thoracentesis done 3 days ago and also one last week for recurrent pleural effusion presents to the ER with complaints of worsening shortness of breath over the last 36 hours.  Has been having some nonproductive cough denies any fever chills denies any chest pain.  ED Course: In the ER patient was in respiratory distress and is to be placed on BiPAP.  Following which patient's respiratory distress improved.  Chest x-ray shows large left pleural effusion which was loculated with some edema.  Pulmonary critical care was consulted.  Patient had previously refused Pleurx catheter but at this time she is considering and pulmonary critical care has requested to continue on BiPAP and get interventional radiology consult for Pleurx catheter placement.  Patient takes Xarelto for upper extremity DVT and she had taken tonight 1 dose before coming.  Review of Systems: As per HPI, rest all negative.   Past Medical History:  Diagnosis Date  . Anemia    during pregnancy  . Breast cancer of upper-outer quadrant of right female breast ([place]) 01/27/2015  . Depression    hx denies any problems now  . History of kidney cancer 12/2015   left removed "spot"  . Lymphedema    rt arm  . Neuropathy   . Sickle cell trait [facility])     Past Surgical History:  Procedure Laterality Date  . LATISSIMUS FLAP TO BREAST Right 06/03/2016   Procedure: RIGHT LATISSIMUS FLAP TO BREAST;  Surgeon: Irene Limbo, MD;  Location: [place];  Service: Plastics;  Laterality: Right;  . LIPOSUCTION WITH LIPOFILLING Right 09/20/2016   Procedure: LIPOSUCTION WITH LIPOFILLING;  Surgeon: Irene Limbo, MD;  Location: [place];  Service: Plastics;  Laterality:  Right;  LIPOSUCTION WITH LIPOFILLING  . MASTECTOMY    . MASTECTOMY MODIFIED RADICAL Right 07/28/2015  . MASTECTOMY MODIFIED RADICAL Right 07/28/2015   Procedure: RIGHT MODIFIED RADICAL MASTECTOMY;  Surgeon: Alphonsa Overall, MD;  Location: [place];  Service: General;  Laterality: Right;  . PORT-A-CATH REMOVAL  07/28/2015  . PORT-A-CATH REMOVAL  07/28/2015   Procedure: REMOVAL PORT-A-CATH;  Surgeon: Alphonsa Overall, MD;  Location: [place];  Service: General;;  . PORTACATH PLACEMENT N/A 02/10/2015   Procedure: INSERTION PORT-A-CATH WITH ULTRA SOUND, left subclavian,;  Surgeon: Alphonsa Overall, MD;  [facility];  Service: General;  Laterality: N/A;  . PORTACATH PLACEMENT N/A 04/04/2017   Procedure: POWER PORT PLACEMENT;  Surgeon: Alphonsa Overall, MD;  [facility];  Service: General;  Laterality: N/A;  . REMOVAL OF TISSUE EXPANDER AND PLACEMENT OF IMPLANT Right 09/20/2016   Procedure: REMOVAL OF RIGHT TISSUE EXPANDER WITH PLACEMENT OF RIGHT SILICONE BREAST IMPLANTS LIPOFILLING FROM ABDOMEN TO RIGHT CHEST;  Surgeon: Irene Limbo, MD;  Location: [place];  Service: Plastics;  Laterality: Right;  PLACEMENT OF RIGHT SILICONE BREAST IMPLANTS LIPOFILLING FROM ABDOMEN TO RIGHT CHEST  . ROBOTIC ASSITED PARTIAL NEPHRECTOMY Left 12/29/2015   Procedure: XI ROBOTIC ASSITED PARTIAL NEPHRECTOMY;  Surgeon: Cleon Gustin, MD;  [facility];  Service: Urology;  Laterality: Left;  . TISSUE EXPANDER PLACEMENT Right 06/03/2016   Procedure: PLACEMENT RIGHT TISSUE EXPANDER;  Surgeon: Irene Limbo, MD;  Location: [place];  Service: Plastics;  Laterality: Right;  . TONSILLECTOMY    .  TUBAL LIGATION    . wisdome teeth extraction       reports that  has never smoked. she has never used smokeless tobacco. She reports that she does not drink alcohol or use drugs.  Allergies  Allergen Reactions  . Sulfa Antibiotics Itching    Vaginal bleeding    History reviewed. No pertinent family  history.  Prior to Admission medications   Medication Sig Start Date End Date Taking? Authorizing Provider  amLODipine (NORVASC) 5 MG tablet Take 1 tablet (5 mg total) by mouth daily. 12/07/17  Yes Osei-Bonsu, Iona Beard, MD  Black Pepper-Turmeric 3-500 MG CAPS Take 1 capsule by mouth daily. 05/21/16  Yes Nicholas Lose, MD  DM-Doxylamine-Acetaminophen (NYQUIL HBP COLD & FLU) 15-6.25-325 MG/15ML LIQD Take 30 mLs by mouth at bedtime as needed (cough/sleep).   Yes [provider]  gabapentin (NEURONTIN) 300 MG capsule Take 1 capsule (300 mg total) by mouth 3 (three) times daily. Patient taking differently: Take 300 mg by mouth 2 (two) times daily.  09/12/17  Yes Nicholas Lose, MD  lidocaine-prilocaine (EMLA) cream Apply to affected area once Patient taking differently: Apply 1 application topically daily as needed. Pain cream for port 11/26/17  Yes Nicholas Lose, MD  Multiple Vitamins-Minerals (MULTIVITAMIN) tablet Take 1 tablet by mouth daily. 05/21/16  Yes Nicholas Lose, MD  oxyCODONE (OXY IR/ROXICODONE) 5 MG immediate release tablet Take 1-2 tablets (5-10 mg total) by mouth every 4 (four) hours as needed for severe pain. 12/24/17  Yes Nicholas Lose, MD  pantoprazole (PROTONIX) 40 MG tablet Take 1 tablet (40 mg total) by mouth daily. 12/07/17  Yes Osei-Bonsu, Iona Beard, MD  potassium chloride SA (K-DUR,KLOR-CON) 20 MEQ tablet TAKE 1 TABLET(20 MEQ) BY MOUTH DAILY 10/09/17  Yes Nicholas Lose, MD  Probiotic Product (PROBIOTIC PO) Take 1 tablet by mouth daily.   Yes [provider]  rivaroxaban (XARELTO) 20 MG TABS tablet Take 1 tablet (20 mg total) by mouth daily with supper. 09/08/17  Yes Nicholas Lose, MD  traZODone (DESYREL) 100 MG tablet Take 1 tablet (100 mg total) by mouth at bedtime as needed for sleep. 12/07/17  Yes Osei-Bonsu, Iona Beard, MD  amoxicillin-clavulanate (AUGMENTIN) 875-125 MG tablet Take 1 tablet by mouth 2 (two) times daily. Patient not taking: Reported on 12/29/2017 12/18/17    Harle Stanford., PA-C  nitroGLYCERIN (NITROSTAT) 0.4 MG SL tablet Place 1 tablet (0.4 mg total) under the tongue every 5 (five) minutes as needed for chest pain (call MD if not better after 2 doses). 12/07/17   Benito Mccreedy, MD  prochlorperazine (COMPAZINE) 10 MG tablet Take 1 tablet (10 mg total) by mouth every 6 (six) hours as needed for nausea or vomiting. 11/21/17   Kyung Rudd, MD    Physical Exam: Vitals:   12/29/17 0200 12/29/17 0212 12/29/17 0300 12/29/17 0319  BP: (!) 146/101 (!) 146/101 (!) 138/104 (!) 138/104  Pulse:  (!) 138 (!) 125 (!) 120  Resp: (!) 38 (!) 34 (!) 24 (!) 25  Temp:      TempSrc:      SpO2: 100% 100% 100% 100%  Weight:      Height:          Constitutional: Moderately built and nourished. Vitals:   12/29/17 0200 12/29/17 0212 12/29/17 0300 12/29/17 0319  BP: (!) 146/101 (!) 146/101 (!) 138/104 (!) 138/104  Pulse:  (!) 138 (!) 125 (!) 120  Resp: (!) 38 (!) 34 (!) 24 (!) 25  Temp:      TempSrc:  SpO2: 100% 100% 100% 100%  Weight:      Height:       Eyes: Anicteric no pallor. ENMT: No discharge from the ears eyes nose or mouth. Neck: No mass felt.  No JVD appreciated. Respiratory: No rhonchi or crepitation.  Poor air entry the left side. Cardiovascular: S1-S2 heard. Abdomen: Soft nontender bowel sounds present. Musculoskeletal: No edema. Skin: No rash. Neurologic: Alert awake oriented to time place and person.  Moves all extremities. Psychiatric: Appears normal.  Normal affect.   Labs on Admission: I have personally reviewed following labs and imaging studies  CBC: Recent Labs  Lab 12/24/17 1402 12/29/17 0105  WBC 4.3 8.3  NEUTROABS 3.4 7.3  HGB  --  9.3*  HCT 27.8* 29.4*  MCV 81.9 83.3  PLT 234 902   Basic Metabolic Panel: Recent Labs  Lab 12/24/17 1402 12/29/17 0105  NA 140 138  K 3.1* 3.1*  CL 105 101  CO2 24 25  GLUCOSE 82 181*  BUN 6* 7  CREATININE 0.68 0.61  CALCIUM 9.3 8.6*   GFR: Estimated Creatinine  Clearance: 79 mL/min (by C-G formula based on SCr of 0.61 mg/dL). Liver Function Tests: Recent Labs  Lab 12/24/17 1402 12/29/17 0105  AST 39* 43*  ALT 26 30  ALKPHOS 311* 289*  BILITOT 0.3 0.6  PROT 7.2 6.9  ALBUMIN 2.6* 2.7*   No results for input(s): LIPASE, AMYLASE in the last 168 hours. No results for input(s): AMMONIA in the last 168 hours. Coagulation Profile: Recent Labs  Lab 12/29/17 0105  INR 1.93   Cardiac Enzymes: No results for input(s): CKTOTAL, CKMB, CKMBINDEX, TROPONINI in the last 168 hours. BNP (last 3 results) No results for input(s): PROBNP in the last 8760 hours. HbA1C: No results for input(s): HGBA1C in the last 72 hours. CBG: No results for input(s): GLUCAP in the last 168 hours. Lipid Profile: No results for input(s): CHOL, HDL, LDLCALC, TRIG, CHOLHDL, LDLDIRECT in the last 72 hours. Thyroid Function Tests: No results for input(s): TSH, T4TOTAL, FREET4, T3FREE, THYROIDAB in the last 72 hours. Anemia Panel: No results for input(s): VITAMINB12, FOLATE, FERRITIN, TIBC, IRON, RETICCTPCT in the last 72 hours. Urine analysis:    Component Value Date/Time   COLORURINE YELLOW 12/10/2017 1600   APPEARANCEUR CLEAR 12/10/2017 1600   LABSPEC 1.012 12/10/2017 1600   PHURINE 5.0 12/10/2017 1600   GLUCOSEU NEGATIVE 12/10/2017 1600   HGBUR NEGATIVE 12/10/2017 1600   BILIRUBINUR NEGATIVE 12/10/2017 1600   KETONESUR NEGATIVE 12/10/2017 1600   PROTEINUR 30 (A) 12/10/2017 1600   NITRITE NEGATIVE 12/10/2017 1600   LEUKOCYTESUR NEGATIVE 12/10/2017 1600   Sepsis Labs: @LABRCNTIP (procalcitonin:4,lacticidven:4) )No results found for this or any previous visit (from the past 240 hour(s)).   Radiological Exams on Admission: Dg Chest Port 1 View  Result Date: 12/29/2017 CLINICAL DATA:  Dyspnea EXAM: PORTABLE CHEST 1 VIEW COMPARISON:  12/26/2017 FINDINGS: Larger left-sided loculated pleural effusion. Mild interstitial edema, slightly increased in appearance. Stable  cardiac and mediastinal contours allowing for partial obscuration due to adjacent loculated left effusion. Left subclavian port catheter tip terminates at the left innominate SVC juncture, unchanged in appearance. Postop change right breast and axilla. IMPRESSION: Larger loculated left-sided pleural effusion with slight increase in mild interstitial edema. Electronically Signed   By: Ashley Royalty M.D.   On: 12/29/2017 01:59    EKG: Independently reviewed.  Sinus tachycardia with rhythm concerning for possible atrial flutter we will recheck EKG.  Assessment/Plan Principal Problem:   Acute respiratory failure  with hypoxia [facility]) Active Problems:   Breast cancer of upper-outer quadrant of right female breast (HCC)   Pleural effusion on left    1. Acute respiratory failure with hypoxia secondary to large left pleural effusion -appreciate pulmonary critical care consult.  Patient is on BiPAP at this time and patient feels better.  Will consult interventional radiology for Pleurx catheter placement.  Patient is agreed for transitioning to heparin and hold Xarelto in anticipation of procedure. 2. Hypertension on amlodipine. 3. 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  Xarelto held and placed on heparin in anticipation of procedure. 4. Breast cancer being followed by oncologist. 5. Normocytic normochromic anemia likely from malignancy and chemotherapy.  Follow CBC.   DVT prophylaxis: On heparin/Xarelto. Code Status: Full code. Family Communication: Patient's husband. Disposition Plan: Home. Consults called: Pulmonary critical care and interventional radiology. Admission status: Observation.   Rise Patience MD [facility] Pager [phone].  If 7PM-7AM, please contact night-coverage [email or web address] Password [facility]  12/29/2017, 3:47 AM

## 2017-12-29 NOTE — Progress Notes (Signed)
Pt transported to ICU on BIPAP without complication.  Pt tolerated well, RT to monitor and assess as needed.  

## 2017-12-29 NOTE — Sedation Documentation (Signed)
Okay to premedicate with versed per Dr. Anselm Pancoast for anxiety AEB HR and RR

## 2017-12-29 NOTE — ED Notes (Signed)
ED TO INPATIENT HANDOFF REPORT  Name/Age/Gender Phyllis Pruitt 47 y.o. female  Code Status    Code Status Orders  (From admission, onward)        Start     Ordered   12/29/17 0346  Full code  Continuous     12/29/17 0346    Code Status History    Date Active Date Inactive Code Status Order ID Comments User Context   12/06/2017 02:08 12/07/2017 16:35 Full Code [id number]  Etta Quill, DO ED   06/03/2016 13:36 06/04/2016 17:13 Full Code [id number]  Irene Limbo, MD Inpatient   07/28/2015 15:20 07/29/2015 14:19 Full Code [id number]  Alphonsa Overall, MD Inpatient      Home/SNF/Other Home  Chief Complaint SOB  Level of Care/Admitting Diagnosis ED Disposition    ED Disposition Condition [place]: [facility] [194174]  Level of Care: Stepdown [14]  Admit to SDU based on following criteria: Respiratory Distress:  Frequent assessment and/or intervention to maintain adequate ventilation/respiration, pulmonary toilet, and respiratory treatment.  Diagnosis: Acute respiratory failure with hypoxia [facility]) [[id number]]  Admitting Physician: Rise Patience [phone]  Attending Physician: Rise Patience [phone]  PT Class (Do Not Modify): Observation [104]  PT Acc Code (Do Not Modify): Observation [10022]       Medical History Past Medical History:  Diagnosis Date  . Anemia    during pregnancy  . Breast cancer of upper-outer quadrant of right female breast ([place]) 01/27/2015  . Depression    hx denies any problems now  . History of kidney cancer 12/2015   left removed "spot"  . Lymphedema    rt arm  . Neuropathy   . Sickle cell trait (HCC)     Allergies Allergies  Allergen Reactions  . Sulfa Antibiotics Itching    Vaginal bleeding    IV Location/Drains/Wounds Patient Lines/Drains/Airways Status   Active Line/Drains/Airways    Name:   Placement date:   Placement time:   Site:   Days:   Implanted Port 04/04/17 Left Chest    04/04/17    0811    Chest   269   Implanted Port   -    -    -      Incision (Closed) 04/04/17 Neck   04/04/17    0826     269          Labs/Imaging Results for orders placed or performed during the hospital encounter of 12/29/17 (from the past 48 hour(s))  Comprehensive metabolic panel     Status: Abnormal   Collection Time: 12/29/17  1:05 AM  Result Value Ref Range   Sodium 138 135 - 145 mmol/L   Potassium 3.1 (L) 3.5 - 5.1 mmol/L   Chloride 101 101 - 111 mmol/L   CO2 25 22 - 32 mmol/L   Glucose, Bld 181 (H) 65 - 99 mg/dL   BUN 7 6 - 20 mg/dL   Creatinine, Ser 0.61 0.44 - 1.00 mg/dL   Calcium 8.6 (L) 8.9 - 10.3 mg/dL   Total Protein 6.9 6.5 - 8.1 g/dL   Albumin 2.7 (L) 3.5 - 5.0 g/dL   AST 43 (H) 15 - 41 U/L   ALT 30 14 - 54 U/L   Alkaline Phosphatase 289 (H) 38 - 126 U/L   Total Bilirubin 0.6 0.3 - 1.2 mg/dL   GFR calc non Af Amer >60 >60 mL/min   GFR calc Af Amer >60 >60 mL/min  Comment: (NOTE) The eGFR has been calculated using the CKD EPI equation. This calculation has not been validated in all clinical situations. eGFR's persistently <60 mL/min signify possible Chronic Kidney Disease.    Anion gap 12 5 - 15    Comment: Performed at [facility], [place] [address] [place], [place] [address]  CBC with Differential/Platelet     Status: Abnormal   Collection Time: 12/29/17  1:05 AM  Result Value Ref Range   WBC 8.3 4.0 - 10.5 K/uL   RBC 3.53 (L) 3.87 - 5.11 MIL/uL   Hemoglobin 9.3 (L) 12.0 - 15.0 g/dL   HCT 29.4 (L) 36.0 - 46.0 %   MCV 83.3 78.0 - 100.0 fL   MCH 26.3 26.0 - 34.0 pg   MCHC 31.6 30.0 - 36.0 g/dL   RDW 20.3 (H) 11.5 - 15.5 %   Platelets 314 150 - 400 K/uL   Neutrophils Relative % 87 %   Neutro Abs 7.3 1.7 - 7.7 K/uL   Lymphocytes Relative 7 %   Lymphs Abs 0.6 (L) 0.7 - 4.0 K/uL   Monocytes Relative 6 %   Monocytes Absolute 0.5 0.1 - 1.0 K/uL   Eosinophils Relative 0 %   Eosinophils Absolute 0.0 0.0 - 0.7 K/uL   Basophils  Relative 0 %   Basophils Absolute 0.0 0.0 - 0.1 K/uL    Comment: Performed at [facility], [place] [address] [place], [place] [address]  Brain natriuretic peptide     Status: None   Collection Time: 12/29/17  1:05 AM  Result Value Ref Range   B Natriuretic Peptide 23.5 0.0 - 100.0 pg/mL    Comment: Performed at [facility], [place] [address], [place], [place] [address]  Protime-INR     Status: Abnormal   Collection Time: 12/29/17  1:05 AM  Result Value Ref Range   Prothrombin Time 21.9 (H) 11.4 - 15.2 seconds   INR 1.93     Comment: Performed at [facility], [place] [address], [place], [place] [address]  Influenza panel by PCR (type A & B)     Status: None   Collection Time: 12/29/17  1:05 AM  Result Value Ref Range   Influenza A By PCR NEGATIVE NEGATIVE   Influenza B By PCR NEGATIVE NEGATIVE    Comment: (NOTE) The Xpert Xpress Flu assay is intended as an aid in the diagnosis of  influenza and should not be used as a sole basis for treatment.  This  assay is FDA approved for nasopharyngeal swab specimens only. Nasal  washings and aspirates are unacceptable for Xpert Xpress Flu testing. Performed at [facility], [place] [address], [place], [place] [address]   I-Stat CG4 Lactic Acid, ED     Status: None   Collection Time: 12/29/17  1:53 AM  Result Value Ref Range   Lactic Acid, Venous 1.81 0.5 - 1.9 mmol/L   Dg Chest Port 1 View  Result Date: 12/29/2017 CLINICAL DATA:  Dyspnea EXAM: PORTABLE CHEST 1 VIEW COMPARISON:  12/26/2017 FINDINGS: Larger left-sided loculated pleural effusion. Mild interstitial edema, slightly increased in appearance. Stable cardiac and mediastinal contours allowing for partial obscuration due to adjacent loculated left effusion. Left subclavian port catheter tip terminates at the left innominate SVC juncture, unchanged in appearance. Postop change right breast and axilla. IMPRESSION: Larger  loculated left-sided pleural effusion with slight increase in mild interstitial edema. Electronically Signed   By: Meredith Leeds.D.  On: 12/29/2017 01:59    Pending Labs Unresulted Labs (From admission, onward)   Start     Ordered   12/30/17 0500  CBC  Daily,   R     12/29/17 0401   12/30/17 0300  Heparin level (unfractionated)  Once-Timed,   STAT     12/29/17 0401   12/30/17 0300  APTT  Once,   R     12/29/17 0404   12/29/17 3448  Basic metabolic panel  Tomorrow morning,   R     12/29/17 0346   12/29/17 0500  CBC  Tomorrow morning,   R     12/29/17 0346      Vitals/Pain Today's Vitals   12/29/17 0300 12/29/17 0319 12/29/17 0330 12/29/17 0400  BP: (!) 138/104 (!) 138/104 (!) 129/103 (!) 136/106  Pulse: (!) 125 (!) 120 (!) 118   Resp: (!) 24 (!) 25 (!) 23 (!) 21  Temp:      TempSrc:      SpO2: 100% 100% 100%   Weight:      Height:      PainSc:        Isolation Precautions No active isolations  Medications Medications  amLODipine (NORVASC) tablet 5 mg (not administered)  gabapentin (NEURONTIN) capsule 300 mg (not administered)  multivitamin with minerals tablet 1 tablet (not administered)  nitroGLYCERIN (NITROSTAT) SL tablet 0.4 mg (not administered)  oxyCODONE (Oxy IR/ROXICODONE) immediate release tablet 5-10 mg (not administered)  pantoprazole (PROTONIX) EC tablet 40 mg (not administered)  potassium chloride SA (K-DUR,KLOR-CON) CR tablet 20 mEq (not administered)  prochlorperazine (COMPAZINE) tablet 10 mg (not administered)  traZODone (DESYREL) tablet 100 mg (not administered)  acetaminophen (TYLENOL) tablet 650 mg (not administered)    Or  acetaminophen (TYLENOL) suppository 650 mg (not administered)  ondansetron (ZOFRAN) tablet 4 mg (not administered)    Or  ondansetron (ZOFRAN) injection 4 mg (not administered)  heparin ADULT infusion 100 units/mL (25000 units/237m sodium chloride 0.45%) (not administered)  albuterol (PROVENTIL) (2.5 MG/3ML) 0.083% nebulizer  solution 5 mg (5 mg Nebulization Given 12/29/17 0109)    Mobility walks

## 2017-12-29 NOTE — Progress Notes (Addendum)
.. ..  Name: Phyllis Pruitt MRN: [id number] DOB: Oct 27, 1970    ADMISSION DATE:  12/29/2017 CONSULTATION DATE:  12/29/17  REFERRING MD :  Christy Gentles MD  CHIEF COMPLAINT:  Shortness of Breath  BRIEF PATIENT DESCRIPTION: 47 yr old female with known history of metastatic breast CA on palliative chemo, radiation on Pembrolizumab immunotherapy with recurrent metastatic effusions previously offered Pleurx and refused presented 3/18 with dyspnea secondary to effusion.  She has not been hypoxic but had increase work of breathing.  She had a thoracentesis 3 days and 1 week prior to admit.  PMH HTN, DVT on Xarelto.    SUBJECTIVE: Pt reports anxiety.  Remains on BiPAP for work of breathing.  Denies pain / SOB.  RN reports she received xanax overnight without relief.   VITAL SIGNS: Temp:  [97.5 F (36.4 C)-98.7 F (37.1 C)] 97.5 F (36.4 C) (03/18 0500) Pulse Rate:  [115-138] 123 (03/18 0734) Resp:  [21-43] 23 (03/18 0430) BP: (129-164)/(90-106) 132/90 (03/18 0545) SpO2:  [99 %-100 %] 100 % (03/18 0734) FiO2 (%):  [60 %] 60 % (03/18 0319) Weight:  [140 lb (63.5 kg)-142 lb (64.4 kg)] 141 lb 15.6 oz (64.4 kg) (03/18 0500)  PHYSICAL EXAMINATION: General: young adult female on BiPAP lying in bed HEENT: MM pink/moist, BiPAP mask in place PSY: anxious  Neuro: AAOx4, speech clear, mAE  CV: s1s2 rrr, no m/r/g PULM: even/non-labored, clear on R, diminished on L  GI: soft, non-tender, bsx4 active  Extremities: warm/dry, no edema  Skin: no rashes or lesions  Recent Labs  Lab 12/24/17 1402 12/29/17 0105 12/29/17 0616  NA 140 138 140  K 3.1* 3.1* 3.4*  CL 105 101 103  CO2 24 25 27   BUN 6* 7 6  CREATININE 0.68 0.61 0.54  GLUCOSE 82 181* 126*   Recent Labs  Lab 12/24/17 1402 12/29/17 0105 12/29/17 0616  HGB  --  9.3* 7.4*  HCT 27.8* 29.4* 23.0*  WBC 4.3 8.3 6.2  PLT 234 314 296   Dg Chest Port 1 View  Result Date: 12/29/2017 CLINICAL DATA:  Dyspnea EXAM: PORTABLE CHEST 1 VIEW  COMPARISON:  12/26/2017 FINDINGS: Larger left-sided loculated pleural effusion. Mild interstitial edema, slightly increased in appearance. Stable cardiac and mediastinal contours allowing for partial obscuration due to adjacent loculated left effusion. Left subclavian port catheter tip terminates at the left innominate SVC juncture, unchanged in appearance. Postop change right breast and axilla. IMPRESSION: Larger loculated left-sided pleural effusion with slight increase in mild interstitial edema. Electronically Signed   By: Ashley Royalty M.D.   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  Prior CT with sternal metastatic disease & possible chest wall involvement.    P: Recommend IR placement of pleurX catheter  Hold home Xarelto until procedure completed  BiPAP for increased WOB until fluid can be drained  Recommend goals of care discussion with patient and family  Oxygen as needed for sats > 90% Heparin gtt per pharmacy > defer timing of hold to IR  Would consider drainage schedule of 1-3 times per week depending on volume drained  Anxiety   P: Add PRN xanax TID  Defer further management to primary SVC   Noe Gens, NP-C [facility] Pgr: [phone] or if no answer [phone] 12/29/2017, 8:11 AM

## 2017-12-29 NOTE — ED Triage Notes (Signed)
States shortness of breath sats in 70's and 80's states she can't breathe breast cancer pt has had fluid drained off of lungs recently, pt anxious found hanging head per EMS out window to breathe.

## 2017-12-29 NOTE — Progress Notes (Signed)
70 Spoke with husband to see how Phyllis Pruitt is feeling this afternoon she was admitted to [facility]  this morning after coming to the ED in the early morning with respiratory difficulty.  He "stated she is feeling a little bit better and is waiting for the doctor in IR for a tube to be placed.  I asked him to let her know I was calling from Dr. Ida Rogue office and Shona Simpson, PA-C's', office that we hope she feels better and I will remove her appointment for tomorrow.

## 2017-12-29 NOTE — Progress Notes (Signed)
Referring Physician(s): Ollis,B,NP  Supervising Physician: Markus Daft  Patient Status:  [facility] - In-pt  Chief Complaint: Dyspnea, recurrent malignant left pleural effusion   Subjective: Patient familiar to IR service from prior left thoracenteses on 3/7 and 12/26/17, yielding 1.8 liters and 900 cc respectively of bloody fluid.  She has a known history of left renal cell carcinoma with partial resection in 2017 and metastatic breast carcinoma initially diagnosed in 2016.  History also significant for prior right upper extremity DVT, on Xarelto as well as sickle cell trait.  She presented to the hospital earlier this morning with worsening dyspnea and recurrent malignant left pleural effusion which is multiloculated.  Request now received from critical care medicine for left Pleurx catheter placement.  She is currently on nasal cannula, tachycardic with rate 130s, hemoglobin 7.4, PT 21.9, INR 1.93.  Last dose of Xarelto was yesterday evening at 6 PM.  Husband at bedside.  She is lethargic but arousable. Past Medical History:  Diagnosis Date  . Anemia    during pregnancy  . Breast cancer of upper-outer quadrant of right female breast ([place]) 01/27/2015  . Depression    hx denies any problems now  . History of kidney cancer 12/2015   left removed "spot"  . Lymphedema    rt arm  . Neuropathy   . Sickle cell trait [facility])    Past Surgical History:  Procedure Laterality Date  . LATISSIMUS FLAP TO BREAST Right 06/03/2016   Procedure: RIGHT LATISSIMUS FLAP TO BREAST;  Surgeon: Irene Limbo, MD;  Location: [place];  Service: Plastics;  Laterality: Right;  . LIPOSUCTION WITH LIPOFILLING Right 09/20/2016   Procedure: LIPOSUCTION WITH LIPOFILLING;  Surgeon: Irene Limbo, MD;  Location: [place] [facility] [place];  Service: Plastics;  Laterality: Right;  LIPOSUCTION WITH LIPOFILLING  . MASTECTOMY    . MASTECTOMY MODIFIED RADICAL Right 07/28/2015  . MASTECTOMY MODIFIED RADICAL Right 07/28/2015   Procedure: RIGHT MODIFIED RADICAL MASTECTOMY;  Surgeon: Alphonsa Overall, MD;  Location: [place];  Service: General;  Laterality: Right;  . PORT-A-CATH REMOVAL  07/28/2015  . PORT-A-CATH REMOVAL  07/28/2015   Procedure: REMOVAL PORT-A-CATH;  Surgeon: Alphonsa Overall, MD;  Location: [place];  Service: General;;  . PORTACATH PLACEMENT N/A 02/10/2015   Procedure: INSERTION PORT-A-CATH WITH ULTRA SOUND, left subclavian,;  Surgeon: Alphonsa Overall, MD;  Location: [facility];  Service: General;  Laterality: N/A;  . PORTACATH PLACEMENT N/A 04/04/2017   Procedure: POWER PORT PLACEMENT;  Surgeon: Alphonsa Overall, MD;  Location: [facility];  Service: General;  Laterality: N/A;  . REMOVAL OF TISSUE EXPANDER AND PLACEMENT OF IMPLANT Right 09/20/2016   Procedure: REMOVAL OF RIGHT TISSUE EXPANDER WITH PLACEMENT OF RIGHT SILICONE BREAST IMPLANTS LIPOFILLING FROM ABDOMEN TO RIGHT CHEST;  Surgeon: Irene Limbo, MD;  Location: [place];  Service: Plastics;  Laterality: Right;  PLACEMENT OF RIGHT SILICONE BREAST IMPLANTS LIPOFILLING FROM ABDOMEN TO RIGHT CHEST  . ROBOTIC ASSITED PARTIAL NEPHRECTOMY Left 12/29/2015   Procedure: XI ROBOTIC ASSITED PARTIAL NEPHRECTOMY;  Surgeon: Cleon Gustin, MD;  Location: [facility];  Service: Urology;  Laterality: Left;  . TISSUE EXPANDER PLACEMENT Right 06/03/2016   Procedure: PLACEMENT RIGHT TISSUE EXPANDER;  Surgeon: Irene Limbo, MD;  Location: [place];  Service: Plastics;  Laterality: Right;  . TONSILLECTOMY    . TUBAL LIGATION    . wisdome teeth extraction        Allergies: Sulfa antibiotics  Medications: Prior to Admission medications   Medication Sig Start Date End  Date Taking? Authorizing Provider  amLODipine (NORVASC) 5 MG tablet Take 1 tablet (5 mg total) by mouth daily. 12/07/17  Yes Osei-Bonsu, Iona Beard, MD  Black Pepper-Turmeric 3-500 MG CAPS Take 1 capsule by mouth daily. 05/21/16  Yes Nicholas Lose, MD  DM-Doxylamine-Acetaminophen (NYQUIL HBP COLD & FLU) 15-6.25-325  MG/15ML LIQD Take 30 mLs by mouth at bedtime as needed (cough/sleep).   Yes [provider]  gabapentin (NEURONTIN) 300 MG capsule Take 1 capsule (300 mg total) by mouth 3 (three) times daily. Patient taking differently: Take 300 mg by mouth 2 (two) times daily.  09/12/17  Yes Nicholas Lose, MD  lidocaine-prilocaine (EMLA) cream Apply to affected area once Patient taking differently: Apply 1 application topically daily as needed. Pain cream for port 11/26/17  Yes Nicholas Lose, MD  Multiple Vitamins-Minerals (MULTIVITAMIN) tablet Take 1 tablet by mouth daily. 05/21/16  Yes Nicholas Lose, MD  oxyCODONE (OXY IR/ROXICODONE) 5 MG immediate release tablet Take 1-2 tablets (5-10 mg total) by mouth every 4 (four) hours as needed for severe pain. 12/24/17  Yes Nicholas Lose, MD  pantoprazole (PROTONIX) 40 MG tablet Take 1 tablet (40 mg total) by mouth daily. 12/07/17  Yes Osei-Bonsu, Iona Beard, MD  potassium chloride SA (K-DUR,KLOR-CON) 20 MEQ tablet TAKE 1 TABLET(20 MEQ) BY MOUTH DAILY 10/09/17  Yes Nicholas Lose, MD  Probiotic Product (PROBIOTIC PO) Take 1 tablet by mouth daily.   Yes [provider]  rivaroxaban (XARELTO) 20 MG TABS tablet Take 1 tablet (20 mg total) by mouth daily with supper. 09/08/17  Yes Nicholas Lose, MD  traZODone (DESYREL) 100 MG tablet Take 1 tablet (100 mg total) by mouth at bedtime as needed for sleep. 12/07/17  Yes Osei-Bonsu, Iona Beard, MD  amoxicillin-clavulanate (AUGMENTIN) 875-125 MG tablet Take 1 tablet by mouth 2 (two) times daily. Patient not taking: Reported on 12/29/2017 12/18/17   Harle Stanford., PA-C  nitroGLYCERIN (NITROSTAT) 0.4 MG SL tablet Place 1 tablet (0.4 mg total) under the tongue every 5 (five) minutes as needed for chest pain (call MD if not better after 2 doses). 12/07/17   Benito Mccreedy, MD  prochlorperazine (COMPAZINE) 10 MG tablet Take 1 tablet (10 mg total) by mouth every 6 (six) hours as needed for nausea or vomiting. 11/21/17   Kyung Rudd, MD       Vital Signs: BP (!) 130/105   Pulse (!) 124   Temp 98.1 F (36.7 C) (Axillary)   Resp (!) 23   Ht 5\' 5"  (1.651 m)   Wt 141 lb 15.6 oz (64.4 kg)   SpO2 96%   BMI 23.63 kg/m   Physical Exam patient lethargic but arousable.  Chest with diminished breath sounds left mid to lower zones, clear on right.  Clean, intact left chest wall Port-A-Cath.  Heart with tachycardic but regular rhythm.  Abdomen soft, positive bowel sounds, nontender.  No lower extremity edema  Imaging: Dg Chest 1 View  Result Date: 12/26/2017 CLINICAL DATA:  Status post thoracentesis EXAM: CHEST  1 VIEW COMPARISON:  Chest radiograph December 10, 2017 and chest CT December 18, 2017 FINDINGS: No evident pneumothorax. Large loculated effusion remains on the left. There is consolidation throughout portions of the left mid lower lung zones. There is more patchy infiltrate in the right upper lobe. Port-A-Cath tip is at the junction of the left innominate vein and superior vena cava. No pneumothorax. There is cardiomegaly with pulmonary vascularity within normal limits. No adenopathy demonstrable by radiography. IMPRESSION: No pneumothorax following thoracentesis. Sizable loculated pleural effusion  on the left remains with consolidation throughout much of the left mid lower lung zones. Patchy airspace opacity right upper lobe persists. Stable cardiac prominence. Stable positioning of Port-A-Cath. No demonstrable adenopathy. Electronically Signed   By: Lowella Grip III M.D.   On: 12/26/2017 14:53   Dg Chest Port 1 View  Result Date: 12/29/2017 CLINICAL DATA:  Dyspnea EXAM: PORTABLE CHEST 1 VIEW COMPARISON:  12/26/2017 FINDINGS: Larger left-sided loculated pleural effusion. Mild interstitial edema, slightly increased in appearance. Stable cardiac and mediastinal contours allowing for partial obscuration due to adjacent loculated left effusion. Left subclavian port catheter tip terminates at the left innominate SVC juncture,  unchanged in appearance. Postop change right breast and axilla. IMPRESSION: Larger loculated left-sided pleural effusion with slight increase in mild interstitial edema. Electronically Signed   By: Ashley Royalty M.D.   On: 12/29/2017 01:59   US Thoracentesis Asp Pleural Space W/img Guide  Result Date: 12/26/2017 INDICATION: Metastatic breast carcinoma with recurrent malignant left pleural effusion, dyspnea. Request made for therapeutic left thoracentesis. EXAM: ULTRASOUND GUIDED THERAPEUTIC LEFT THORACENTESIS MEDICATIONS: None COMPLICATIONS: None immediate. PROCEDURE: An ultrasound guided thoracentesis was thoroughly discussed with the patient and questions answered. The benefits, risks, alternatives and complications were also discussed. The patient understands and wishes to proceed with the procedure. Written consent was obtained. Ultrasound was performed to localize and mark an adequate pocket of fluid in the left chest. The area was then prepped and draped in the normal sterile fashion. 1% Lidocaine was used for local anesthesia. Under ultrasound guidance a catheter was introduced. Thoracentesis was performed. The catheter was removed and a dressing applied. FINDINGS: A total of approximately 900 cc of bloody fluid was removed. Due to patient coughing and chest discomfort only the above amount of fluid was removed today. IMPRESSION: Successful ultrasound guided therapeutic left thoracentesis yielding 900 cc of pleural fluid. Follow-up chest x-ray revealed no pneumothorax. Read by: Rowe Robert, PA-C Electronically Signed   By: Lucrezia Europe M.D.   On: 12/26/2017 15:20    Labs:  CBC: Recent Labs    11/13/17 0813  12/05/17 2117  12/18/17 1327 12/24/17 1402 12/29/17 0105 12/29/17 0616  WBC 3.9   < > 6.7   < > 12.6* 4.3 8.3 6.2  HGB 9.7*  --  11.0*  --   --   --  9.3* 7.4*  HCT 29.6*   < > 33.3*   < > 36.8 27.8* 29.4* 23.0*  PLT 347   < > 313   < > 241 234 314 296   < > = values in this interval not  displayed.    COAGS: Recent Labs    10/29/17 1147 12/29/17 0105  INR 1.19 1.93    BMP: Recent Labs    12/18/17 1327 12/24/17 1402 12/29/17 0105 12/29/17 0616  NA 139 140 138 140  K 3.7 3.1* 3.1* 3.4*  CL 104 105 101 103  CO2 23 24 25 27   GLUCOSE 140 82 181* 126*  BUN 9 6* 7 6  CALCIUM 9.3 9.3 8.6* 8.7*  CREATININE 0.73 0.68 0.61 0.54  GFRNONAA >60 >60 >60 >60  GFRAA >60 >60 >60 >60    LIVER FUNCTION TESTS: Recent Labs    12/09/17 1005 12/18/17 1327 12/24/17 1402 12/29/17 0105  BILITOT 0.4 0.7 0.3 0.6  AST 31 33 39* 43*  ALT 25 25 26 30   ALKPHOS 284* 333* 311* 289*  PROT 7.2 7.1 7.2 6.9  ALBUMIN 2.6* 2.4* 2.6* 2.7*    Assessment  and Plan: Pt with known history of left renal cell carcinoma with partial resection in 2017 and metastatic right breast carcinoma initially diagnosed in 2016.  History also significant for prior right upper extremity DVT, on Xarelto as well as sickle cell trait.  She presented to the hospital earlier this morning with worsening dyspnea and recurrent malignant left pleural effusion which is multiloculated.  Status post previous thoracenteses on 3/7 and 12/26/17 with 1.8 L and 900 cc removed respectively.  Request now received from critical care medicine for left Pleurx catheter placement.  She is currently on nasal cannula, tachycardic with rate 130s, hemoglobin 7.4, PT 21.9, INR 1.93.  Last dose of Xarelto was yesterday evening at 6 PM. Case has been reviewed by Dr.Henn.  Details/risks of procedure, including but not limited to, internal bleeding, infection, injury to adjacent structures, pneumothorax discussed with patient/husband with their understanding and consent.  Would recommend blood transfusion prior to procedure.  We will recheck PT/INR.  Procedure tentatively scheduled for later this afternoon.   Electronically Signed: D. Rowe Robert, PA-C 12/29/2017, 10:21 AM   I spent a total of 20 minutes at the the patient's bedside AND on  the patient's hospital floor or unit, greater than 50% of which was counseling/coordinating care for left Pleurx catheter placement    Patient ID: [name], female   DOB: [birth year], 47 y.o.   MRN: [id number]

## 2017-12-30 ENCOUNTER — Inpatient Hospital Stay (HOSPITAL_COMMUNITY): Payer: 59

## 2017-12-30 ENCOUNTER — Ambulatory Visit: Admission: RE | Admit: 2017-12-30 | Payer: 59 | Source: Ambulatory Visit | Admitting: Radiation Oncology

## 2017-12-30 DIAGNOSIS — I5030 Unspecified diastolic (congestive) heart failure: Secondary | ICD-10-CM

## 2017-12-30 DIAGNOSIS — J910 Malignant pleural effusion: Secondary | ICD-10-CM

## 2017-12-30 LAB — TYPE AND SCREEN
ABO/RH(D): A POS
ANTIBODY SCREEN: NEGATIVE
UNIT DIVISION: 0

## 2017-12-30 LAB — CBC
HCT: 26.1 % — ABNORMAL LOW (ref 36.0–46.0)
Hemoglobin: 8.4 g/dL — ABNORMAL LOW (ref 12.0–15.0)
MCH: 27 pg (ref 26.0–34.0)
MCHC: 32.2 g/dL (ref 30.0–36.0)
MCV: 83.9 fL (ref 78.0–100.0)
Platelets: 289 10*3/uL (ref 150–400)
RBC: 3.11 MIL/uL — ABNORMAL LOW (ref 3.87–5.11)
RDW: 19.7 % — AB (ref 11.5–15.5)
WBC: 5.7 10*3/uL (ref 4.0–10.5)

## 2017-12-30 LAB — BPAM RBC
BLOOD PRODUCT EXPIRATION DATE: 201904012359
ISSUE DATE / TIME: 201903181332
Unit Type and Rh: 6200

## 2017-12-30 LAB — ECHOCARDIOGRAM COMPLETE
HEIGHTINCHES: 65 in
WEIGHTICAEL: 2271.62 [oz_av]

## 2017-12-30 MED ORDER — LEVALBUTEROL HCL 0.63 MG/3ML IN NEBU: 0.6300 mg | INHALATION_SOLUTION | Freq: Four times a day (QID) | RESPIRATORY_TRACT | Status: DC | PRN

## 2017-12-30 MED ORDER — MORPHINE SULFATE (PF) 4 MG/ML IV SOLN
INTRAVENOUS | Status: AC
  Administered 2017-12-30: 2 mg via INTRAVENOUS
  Filled 2017-12-30: qty 1

## 2017-12-30 MED ORDER — MORPHINE SULFATE (PF) 4 MG/ML IV SOLN
1.0000 mg | INTRAVENOUS | Status: DC | PRN
Start: 2017-12-30 — End: 2018-01-07
  Administered 2017-12-30 – 2017-12-31 (×4): 2 mg via INTRAVENOUS
  Administered 2018-01-02 – 2018-01-03 (×2): 1 mg via INTRAVENOUS
  Filled 2017-12-30 (×5): qty 1

## 2017-12-30 NOTE — Progress Notes (Signed)
  Echocardiogram 2D Echocardiogram has been performed.  Phyllis Pruitt G Phyllis Pruitt 12/30/2017, 4:31 PM

## 2017-12-30 NOTE — Progress Notes (Signed)
Patient Pleurx catheter drained 50cc of bloody fluid. Patient tolerated procedure well. Patient positive for mild cough after the drainage. Will continue to monitor patient.

## 2017-12-30 NOTE — Progress Notes (Signed)
.. ..  Name: Phyllis Pruitt MRN: [id number] DOB: 20-Jun-1971    ADMISSION DATE:  12/29/2017 CONSULTATION DATE:  12/29/17  REFERRING MD :  Christy Gentles MD  CHIEF COMPLAINT:  Shortness of Breath  BRIEF PATIENT DESCRIPTION:  47 yo female with Stage IV breast cancer and recurrent Lt malignant effusion developed hypoxia from progressive effusion.  She has been on pembrolizumab and radiation for breast cancer.  PMHx of HTN, DVT on xarelto.  SUBJECTIVE:  Breathing better, but still short of breath.  Doesn't feel like Bipap helps.    VITAL SIGNS: BP (!) 169/110 Comment: Hypertension medication given early  Pulse (!) 134   Temp 98.9 F (37.2 C) (Oral)   Resp (!) 29   Ht 5\' 5"  (1.651 m)   Wt 141 lb 15.6 oz (64.4 kg)   SpO2 98%   BMI 23.63 kg/m   INTAKE/OUTPUT: I/O last 3 completed shifts: In: 300 [Blood:300] Out: 325 [Urine:325]  PHYSICAL EXAMINATION:  General - pleasant, increased WOB Eyes - pupils reactive ENT - no sinus tenderness, no oral exudate, no LAN Cardiac - regular, tachycardic, 2/6 murmur Chest - decreased BS at bases Lt > Rt Abd - soft, non tender Ext - no edema Skin - no rashes Neuro - normal strength   CBC Recent Labs    12/29/17 0105 12/29/17 0616 12/30/17 0500  WBC 8.3 6.2 5.7  HGB 9.3* 7.4* 8.4*  HCT 29.4* 23.0* 26.1*  PLT 314 296 289    Coag's Recent Labs    12/29/17 0105 12/29/17 1128  INR 1.93 1.40    BMET Recent Labs    12/29/17 0105 12/29/17 0616  NA 138 140  K 3.1* 3.4*  CL 101 103  CO2 25 27  BUN 7 6  CREATININE 0.61 0.54  GLUCOSE 181* 126*    Electrolytes Recent Labs    12/29/17 0105 12/29/17 0616  CALCIUM 8.6* 8.7*    Sepsis Markers No results for input(s): PROCALCITON, O2SATVEN in the last 72 hours.  Invalid input(s): LACTICACIDVEN  ABG No results for input(s): PHART, PCO2ART, PO2ART in the last 72 hours.  Liver Enzymes Recent Labs    12/29/17 0105  AST 43*  ALT 30  ALKPHOS 289*  BILITOT 0.6  ALBUMIN  2.7*    Cardiac Enzymes No results for input(s): TROPONINI, PROBNP in the last 72 hours.  Glucose No results for input(s): GLUCAP in the last 72 hours.  Imaging Ir Lenise Arena W Catheter Placement  Result Date: 12/29/2017 INDICATION: 47 year old with acute respiratory failure with hypoxia and malignant left pleural effusion. History of breast cancer. EXAM: PLACEMENT OF TUNNELED LEFT PLEURAL CATHETER WITH ULTRASOUND AND FLUOROSCOPIC GUIDANCE MEDICATIONS: Ancef 2 g ANESTHESIA/SEDATION: Fentanyl 150 mcg IV; Versed 3.0 mg IV Moderate Sedation Time:  34 minutes The patient was continuously monitored during the procedure by the interventional radiology nurse under my direct supervision. FLUOROSCOPY TIME:  18 seconds, 11 mGy COMPLICATIONS: None immediate. PROCEDURE: Informed written consent was obtained from the patient after a thorough discussion of the procedural risks, benefits and alternatives. All questions were addressed. Maximal Sterile Barrier Technique was utilized including caps, mask, sterile gowns, sterile gloves, sterile drape, hand hygiene and skin antiseptic. A timeout was performed prior to the initiation of the procedure. Procedure was technically difficult due to patient's respiratory distress. Wedge was placed underneath the patient to keep her upright and she was rolled onto her right side. The left mid axillary region and posterior left chest were prepped and draped in sterile fashion. Ultrasound  demonstrated a large loculated pleural effusion. Skin was anesthetized along the posterior mid axillary line. Small incision was made. Yueh catheter was directed in the pleural space while aspirating with ultrasound guidance. Bloody fluid was aspirated. An incision was made slightly anterior and caudal to the pleural catheter. PleurX catheter was tunneled between the incisions. The cuff was placed underneath the skin. Amplatz wire was advanced through the pleural catheter into the pleural space.  Tract was dilated to accommodate a peel-away sheath. PleurX catheter was advanced through the peel-away sheath into the pleural space. Catheter was attached to vacuum bottle. Approximately 800 mL of bloody fluid was removed. Fluid removal was stopped due to patient coughing. Catheter was sutured to skin with Prolene suture. The pleural entrance site was closed using absorbable suture and Dermabond. Fluoroscopic and ultrasound images were taken and saved for documentation. FINDINGS: Large loculated left pleural effusion. Pleural catheter coiled in the left lower chest. 800 mL of bloody fluid was removed. IMPRESSION: Placement of a tunneled left pleural catheter with ultrasound and fluoroscopic guidance. Electronically Signed   By: Markus Daft M.D.   On: 12/29/2017 17:28   Dg Chest Port 1 View  Result Date: 12/30/2017 CLINICAL DATA:  Pleural effusion. History of renal cancer. History of breast cancer. Surgical clips noted over the right chest. No acute bony abnormality. EXAM: PORTABLE CHEST 1 VIEW COMPARISON:  12/29/2017. FINDINGS: PowerPort catheter noted with tip over the superior vena cava in stable position. Left base pleural catheter in stable position. Heart size normal. Persistent but improved large left pleural effusion. Persistent but improved right lung infiltrate with right apical pleural thickening. IMPRESSION: 1. PowerPort catheter and left base pleural catheter in stable position. No pneumothorax. 2.  Persistent but improved large left pleural effusion. 3. Persistent right lung infiltrates with persistent right apical pleural thickening. No interim change. Electronically Signed   By: Marcello Moores  Register   On: 12/30/2017 07:00   Dg Chest Port 1 View  Result Date: 12/29/2017 CLINICAL DATA:  Status post left pleural catheter placement. EXAM: PORTABLE CHEST 1 VIEW COMPARISON:  12/29/2017 FINDINGS: Left subclavian Port-A-Cath terminates over the upper SVC. A small caliber pleural catheter has been placed  and projects over the left lung base. Surgical clips are present in the right axilla. The cardiomediastinal silhouette is grossly unchanged allowing for obscuration of the left heart border. There is a persistent large, partially loculated left pleural effusion, at most slightly smaller than on the prior study. Consolidation and/or atelectasis remains throughout much of the left lung. Interstitial and patchy airspace opacities in the right lung is not significantly changed. No pneumothorax is identified. IMPRESSION: 1. Interval left pleural catheter placement.  No pneumothorax. 2. Similar appearance of large, partially loculated left pleural effusion. 3. Unchanged interstitial and patchy airspace opacities in the right lung, possibly edema. Electronically Signed   By: Logan Bores M.D.   On: 12/29/2017 17:08   Dg Chest Port 1 View  Result Date: 12/29/2017 CLINICAL DATA:  Dyspnea EXAM: PORTABLE CHEST 1 VIEW COMPARISON:  12/26/2017 FINDINGS: Larger left-sided loculated pleural effusion. Mild interstitial edema, slightly increased in appearance. Stable cardiac and mediastinal contours allowing for partial obscuration due to adjacent loculated left effusion. Left subclavian port catheter tip terminates at the left innominate SVC juncture, unchanged in appearance. Postop change right breast and axilla. IMPRESSION: Larger loculated left-sided pleural effusion with slight increase in mild interstitial edema. Electronically Signed   By: Ashley Royalty M.D.   On: 12/29/2017 01:59    SIGNIFICANT  EVENTS  3/18  Admit with SOB, pleurx by IR  DISCUSSION: Has some improvement in respiratory status after pleurx placement and fluid removal.  Need to be cautious about fluid removal rate given that she had re-expansion pulmonary edema early this month after thoracentesis.  She remains tachycardic and has prominent murmurm.  ASSESSMENT / PLAN:  Acute hypoxic respiratory failure. - oxygen to keep SpO2 > 92% - Bipap prn -  morphine prn for dyspnea  Malignant pleural effusion in setting of Stage IV breast cancer. - drain fluid daily as tolerated - f/u CXR intermittently  Sinus tachycardia, murmur, hx of HTN. - check Echo - continue norvasc  Hx of DVT. - to resume xarelto in PM of 3/19  Anxiety. - prn xanax  DVT prophylaxis - xarelto SUP - protonix Nutrition - regular diet Goals of care - full code  Chesley Mires, MD [facility] 12/30/2017, 9:11 AM Pager:  [phone] After 3pm call: [phone]

## 2017-12-30 NOTE — Progress Notes (Signed)
PT eating breakfast at this time. Does appear to be slightly SOB. PT denies need for BiPAP at this time.

## 2017-12-30 NOTE — Progress Notes (Signed)
Patient put on Bipap at 1645 by Respiratory Therapist. Patient appears to be in moderate distress with breathing. Patient is experiencing tachycardia/tachypnea and is hypertensive. Pulmonologist notified of patient's status. Will continue to monitor patient.

## 2017-12-30 NOTE — Progress Notes (Signed)
Chief Complaint: Patient was seen today for follow up (L)PleurX catheter placement   Supervising Physician: Daryll Brod  Patient Status: [facility] - In-pt  Subjective: S.p (L)Pleurx cath yesterday. Still fairly symptomatic from respiratory standpoint. Attempted to drain again today, only 50 mL bloody fluid removed. Had 900 mL removed with thora 3/15 and another 800 ml removed yesterday, so would not expect much fluid output today.  Objective: Physical Exam: BP (!) 169/110 Comment: Hypertension medication given early  Pulse (!) 134   Temp 98.9 F (37.2 C) (Oral)   Resp (!) 29   Ht 5\' 5"  (1.651 m)   Wt 141 lb 15.6 oz (64.4 kg)   SpO2 98%   BMI 23.63 kg/m  (L)drain intact, site clean. No kinks, hematoma, or leaking. Mildly tender   Current Facility-Administered Medications:  .  acetaminophen (TYLENOL) tablet 650 mg, 650 mg, Oral, Q6H PRN **OR** acetaminophen (TYLENOL) suppository 650 mg, 650 mg, Rectal, Q6H PRN, Rise Patience, MD .  ALPRAZolam Duanne Moron) tablet 0.5 mg, 0.5 mg, Oral, TID PRN, Alfredo Martinez, Brandi L, NP, 0.5 mg at 12/30/17 0725 .  amLODipine (NORVASC) tablet 5 mg, 5 mg, Oral, Daily, Rise Patience, MD, 5 mg at 12/30/17 0731 .  chlorhexidine (PERIDEX) 0.12 % solution 15 mL, 15 mL, Mouth Rinse, BID, Charlynne Cousins, MD, 15 mL at 12/30/17 0929 .  feeding supplement (ENSURE ENLIVE) (ENSURE ENLIVE) liquid 237 mL, 237 mL, Oral, BID BM, Charlynne Cousins, MD, 237 mL at 12/30/17 0929 .  gabapentin (NEURONTIN) capsule 300 mg, 300 mg, Oral, BID, Rise Patience, MD, 300 mg at 12/30/17 0929 .  levalbuterol (XOPENEX) nebulizer solution 0.63 mg, 0.63 mg, Nebulization, Q6H PRN, Chesley Mires, MD .  lip balm (CARMEX) ointment, , Topical, PRN, Rise Patience, MD, 1 application at 63/87/56 1353 .  MEDLINE mouth rinse, 15 mL, Mouth Rinse, q12n4p, Charlynne Cousins, MD .  morphine 4 MG/ML injection 1-2 mg, 1-2 mg, Intravenous, Q3H PRN, Chesley Mires, MD, 2 mg  at 12/30/17 1053 .  multivitamin with minerals tablet 1 tablet, 1 tablet, Oral, Daily, Rise Patience, MD, 1 tablet at 12/30/17 0929 .  nitroGLYCERIN (NITROSTAT) SL tablet 0.4 mg, 0.4 mg, Sublingual, Q5 min PRN, Rise Patience, MD .  ondansetron [facility]) tablet 4 mg, 4 mg, Oral, Q6H PRN **OR** ondansetron (ZOFRAN) injection 4 mg, 4 mg, Intravenous, Q6H PRN, Rise Patience, MD .  oxyCODONE (Oxy IR/ROXICODONE) immediate release tablet 5-10 mg, 5-10 mg, Oral, Q4H PRN, Rise Patience, MD, 10 mg at 12/30/17 0731 .  pantoprazole (PROTONIX) EC tablet 40 mg, 40 mg, Oral, Daily, Rise Patience, MD, 40 mg at 12/30/17 0929 .  potassium chloride SA (K-DUR,KLOR-CON) CR tablet 20 mEq, 20 mEq, Oral, Daily, Rise Patience, MD, 20 mEq at 12/30/17 0929 .  prochlorperazine (COMPAZINE) tablet 10 mg, 10 mg, Oral, Q6H PRN, Rise Patience, MD .  rivaroxaban (XARELTO) tablet 20 mg, 20 mg, Oral, Q supper, Kirby-Graham, Karsten Fells, NP .  traZODone (DESYREL) tablet 100 mg, 100 mg, Oral, QHS PRN, Rise Patience, MD  Facility-Administered Medications Ordered in Other Encounters:  .  sodium chloride flush (NS) 0.9 % injection 10 mL, 10 mL, Intracatheter, PRN, Nicholas Lose, MD, 10 mL at 12/24/17 1729  Labs: CBC Recent Labs    12/29/17 0616 12/30/17 0500  WBC 6.2 5.7  HGB 7.4* 8.4*  HCT 23.0* 26.1*  PLT 296 289   BMET Recent Labs    12/29/17 0105 12/29/17  0616  NA 138 140  K 3.1* 3.4*  CL 101 103  CO2 25 27  GLUCOSE 181* 126*  BUN 7 6  CREATININE 0.61 0.54  CALCIUM 8.6* 8.7*   LFT Recent Labs    12/29/17 0105  PROT 6.9  ALBUMIN 2.7*  AST 43*  ALT 30  ALKPHOS 289*  BILITOT 0.6   PT/INR Recent Labs    12/29/17 0105 12/29/17 1128  LABPROT 21.9* 17.0*  INR 1.93 1.40     Studies/Results: Ir Lenise Arena W Catheter Placement  Result Date: 12/29/2017 INDICATION: 47 year old with acute respiratory failure with hypoxia and malignant left pleural  effusion. History of breast cancer. EXAM: PLACEMENT OF TUNNELED LEFT PLEURAL CATHETER WITH ULTRASOUND AND FLUOROSCOPIC GUIDANCE MEDICATIONS: Ancef 2 g ANESTHESIA/SEDATION: Fentanyl 150 mcg IV; Versed 3.0 mg IV Moderate Sedation Time:  34 minutes The patient was continuously monitored during the procedure by the interventional radiology nurse under my direct supervision. FLUOROSCOPY TIME:  18 seconds, 11 mGy COMPLICATIONS: None immediate. PROCEDURE: Informed written consent was obtained from the patient after a thorough discussion of the procedural risks, benefits and alternatives. All questions were addressed. Maximal Sterile Barrier Technique was utilized including caps, mask, sterile gowns, sterile gloves, sterile drape, hand hygiene and skin antiseptic. A timeout was performed prior to the initiation of the procedure. Procedure was technically difficult due to patient's respiratory distress. Wedge was placed underneath the patient to keep her upright and she was rolled onto her right side. The left mid axillary region and posterior left chest were prepped and draped in sterile fashion. Ultrasound demonstrated a large loculated pleural effusion. Skin was anesthetized along the posterior mid axillary line. Small incision was made. Yueh catheter was directed in the pleural space while aspirating with ultrasound guidance. Bloody fluid was aspirated. An incision was made slightly anterior and caudal to the pleural catheter. PleurX catheter was tunneled between the incisions. The cuff was placed underneath the skin. Amplatz wire was advanced through the pleural catheter into the pleural space. Tract was dilated to accommodate a peel-away sheath. PleurX catheter was advanced through the peel-away sheath into the pleural space. Catheter was attached to vacuum bottle. Approximately 800 mL of bloody fluid was removed. Fluid removal was stopped due to patient coughing. Catheter was sutured to skin with Prolene suture. The  pleural entrance site was closed using absorbable suture and Dermabond. Fluoroscopic and ultrasound images were taken and saved for documentation. FINDINGS: Large loculated left pleural effusion. Pleural catheter coiled in the left lower chest. 800 mL of bloody fluid was removed. IMPRESSION: Placement of a tunneled left pleural catheter with ultrasound and fluoroscopic guidance. Electronically Signed   By: Markus Daft M.D.   On: 12/29/2017 17:28   Dg Chest Port 1 View  Result Date: 12/30/2017 CLINICAL DATA:  Pleural effusion. History of renal cancer. History of breast cancer. Surgical clips noted over the right chest. No acute bony abnormality. EXAM: PORTABLE CHEST 1 VIEW COMPARISON:  12/29/2017. FINDINGS: PowerPort catheter noted with tip over the superior vena cava in stable position. Left base pleural catheter in stable position. Heart size normal. Persistent but improved large left pleural effusion. Persistent but improved right lung infiltrate with right apical pleural thickening. IMPRESSION: 1. PowerPort catheter and left base pleural catheter in stable position. No pneumothorax. 2.  Persistent but improved large left pleural effusion. 3. Persistent right lung infiltrates with persistent right apical pleural thickening. No interim change. Electronically Signed   By: Marcello Moores  Register   On: 12/30/2017 07:00  Dg Chest Port 1 View  Result Date: 12/29/2017 CLINICAL DATA:  Status post left pleural catheter placement. EXAM: PORTABLE CHEST 1 VIEW COMPARISON:  12/29/2017 FINDINGS: Left subclavian Port-A-Cath terminates over the upper SVC. A small caliber pleural catheter has been placed and projects over the left lung base. Surgical clips are present in the right axilla. The cardiomediastinal silhouette is grossly unchanged allowing for obscuration of the left heart border. There is a persistent large, partially loculated left pleural effusion, at most slightly smaller than on the prior study. Consolidation  and/or atelectasis remains throughout much of the left lung. Interstitial and patchy airspace opacities in the right lung is not significantly changed. No pneumothorax is identified. IMPRESSION: 1. Interval left pleural catheter placement.  No pneumothorax. 2. Similar appearance of large, partially loculated left pleural effusion. 3. Unchanged interstitial and patchy airspace opacities in the right lung, possibly edema. Electronically Signed   By: Logan Bores M.D.   On: 12/29/2017 17:08   Dg Chest Port 1 View  Result Date: 12/29/2017 CLINICAL DATA:  Dyspnea EXAM: PORTABLE CHEST 1 VIEW COMPARISON:  12/26/2017 FINDINGS: Larger left-sided loculated pleural effusion. Mild interstitial edema, slightly increased in appearance. Stable cardiac and mediastinal contours allowing for partial obscuration due to adjacent loculated left effusion. Left subclavian port catheter tip terminates at the left innominate SVC juncture, unchanged in appearance. Postop change right breast and axilla. IMPRESSION: Larger loculated left-sided pleural effusion with slight increase in mild interstitial edema. Electronically Signed   By: Ashley Royalty M.D.   On: 12/29/2017 01:59    Assessment/Plan: Loculated malignant left pleural effusion S/p (L)PleurX. Would not expect complete evacuation of effusion by PleurX given loculated status. May need to drain every other day. IR following    LOS: 1 day   I spent a total of 20 minutes in face to face in clinical consultation, greater than 50% of which was counseling/coordinating care for (L)PleurX  Ascencion Dike PA-C 12/30/2017 11:21 AM

## 2017-12-30 NOTE — Progress Notes (Signed)
PROGRESS NOTE    Phyllis Pruitt  OYD:[id number] DOB: [birth year] DOA: 12/29/2017 PCP: [name], MD  Outpatient Specialists:     Brief Narrative: Patient is a 47 year old African-American female with past medical history significant for stage IV breast cancer with recurrent left-sided pleural effusion status post repeated thoracentesis and Pleurx catheter placement.  Patient was admitted with worsening acute hypoxemic respiratory failure.   Assessment & Plan:   Principal Problem:   Acute respiratory failure with hypoxia (HCC) Active Problems:   Breast cancer of upper-outer quadrant of right female breast (HCC)   Pleural effusion on left   Acute hypoxemic respiratory failure: -This is likely secondary to recurrent left-sided malignant pleural effusion. -Pleurx catheter is in place. -Pulmonary team as well as interventional radiology team has been consulted.  Input is highly appreciated. -I agree with neb treatment as planned by the pulmonary team.  Will also add incentive spirometry and flutter valve treatment. -Guarded prognosis.  Recurrent left-sided malignant pleural effusion: -Please see above.  History of DVT: -Continue Xarelto.  Stage IV right-sided breast cancer: -As per the oncology team.  Hypertension: -Continue to optimize. -Continue amlodipine.  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 -Continue to monitor and transfuse as needed.     DVT prophylaxis: Xarelto Code Status: Full Family Communication: None available Disposition Plan: Will depend on hospital course   Consultants:   Pulmonary and interventional radiology  Procedures:   Patient is status post Pleurx catheter placement.  Patient has also undergone recurrent thoracentesis.  Antimicrobials:   None   Subjective: Patient is quite short of breath, anxious.  Patient is also tachycardic.  No fever or chills, no chest pain,.  Objective: Vitals:   12/30/17 0600 12/30/17  0730 12/30/17 0800 12/30/17 1200  BP: (!) 161/104 (!) 169/110    Pulse: (!) 133 (!) 134    Resp:  (!) 29    Temp:   98.9 F (37.2 C) 98.7 F (37.1 C)  TempSrc:   Oral Oral  SpO2: 93% 98%    Weight:      Height:        Intake/Output Summary (Last 24 hours) at 12/30/2017 1334 Last data filed at 12/30/2017 1044 Gross per 24 hour  Intake 300 ml  Output 675 ml  Net -375 ml   Filed Weights   12/29/17 0053 12/29/17 0058 12/29/17 0500  Weight: 64.4 kg (142 lb) 63.5 kg (140 lb) 64.4 kg (141 lb 15.6 oz)    Examination:  General exam: Appears anxious and dyspneic.   Respiratory system: Decreased air entry left lower lung base.   Cardiovascular system: S1 & S2, tachycardia.  No pedal edema. Gastrointestinal system: Abdomen is nondistended, soft and nontender. No organomegaly or masses felt. Normal bowel sounds heard. Central nervous system: Alert and oriented.  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   Psychiatry: Anxious.       Data Reviewed: I have personally reviewed following labs and imaging studies  CBC: Recent Labs  Lab 12/24/17 1402 12/29/17 0105 12/29/17 0616 12/30/17 0500  WBC 4.3 8.3 6.2 5.7  NEUTROABS 3.4 7.3  --   --   HGB  --  9.3* 7.4* 8.4*  HCT 27.8* 29.4* 23.0* 26.1*  MCV 81.9 83.3 82.4 83.9  PLT 234 314 296 672   Basic Metabolic Panel: Recent Labs  Lab 12/24/17 1402 12/29/17 0105 12/29/17 0616  NA 140 138 140  K  3.1* 3.1* 3.4*  CL 105 101 103  CO2 24 25 27   GLUCOSE 82 181* 126*  BUN 6* 7 6  CREATININE 0.68 0.61 0.54  CALCIUM 9.3 8.6* 8.7*   GFR: Estimated Creatinine Clearance: 77.4 mL/min (by C-G formula based on SCr of 0.54 mg/dL). Liver Function Tests: Recent Labs  Lab 12/24/17 1402 12/29/17 0105  AST 39* 43*  ALT 26 30  ALKPHOS 311* 289*  BILITOT 0.3 0.6  PROT 7.2 6.9  ALBUMIN 2.6* 2.7*   No results for input(s):  LIPASE, AMYLASE in the last 168 hours. No results for input(s): AMMONIA in the last 168 hours. Coagulation Profile: Recent Labs  Lab 12/29/17 0105 12/29/17 1128  INR 1.93 1.40   Cardiac Enzymes: No results for input(s): CKTOTAL, CKMB, CKMBINDEX, TROPONINI in the last 168 hours. BNP (last 3 results) No results for input(s): PROBNP in the last 8760 hours. HbA1C: No results for input(s): HGBA1C in the last 72 hours. CBG: No results for input(s): GLUCAP in the last 168 hours. Lipid Profile: No results for input(s): CHOL, HDL, LDLCALC, TRIG, CHOLHDL, LDLDIRECT in the last 72 hours. Thyroid Function Tests: No results for input(s): TSH, T4TOTAL, FREET4, T3FREE, THYROIDAB in the last 72 hours. Anemia Panel: No results for input(s): VITAMINB12, FOLATE, FERRITIN, TIBC, IRON, RETICCTPCT in the last 72 hours. Urine analysis:    Component Value Date/Time   COLORURINE YELLOW 12/10/2017 1600   APPEARANCEUR CLEAR 12/10/2017 1600   LABSPEC 1.012 12/10/2017 1600   PHURINE 5.0 12/10/2017 1600   GLUCOSEU NEGATIVE 12/10/2017 1600   HGBUR NEGATIVE 12/10/2017 1600   BILIRUBINUR NEGATIVE 12/10/2017 1600   KETONESUR NEGATIVE 12/10/2017 1600   PROTEINUR 30 (A) 12/10/2017 1600   NITRITE NEGATIVE 12/10/2017 1600   LEUKOCYTESUR NEGATIVE 12/10/2017 1600   Sepsis Labs: @LABRCNTIP (procalcitonin:4,lacticidven:4)  ) Recent Results (from the past 240 hour(s))  MRSA PCR Screening     Status: None   Collection Time: 12/29/17  5:01 AM  Result Value Ref Range Status   MRSA by PCR NEGATIVE NEGATIVE Final    Comment:        The GeneXpert MRSA Assay (FDA approved for NASAL specimens only), is one component of a comprehensive MRSA colonization surveillance program. It is not intended to diagnose MRSA infection nor to guide or monitor treatment for MRSA infections. Performed at [facility], [place] [address], [place], [place] [address]          Radiology Studies: Ir Lenise Arena W Catheter Placement  Result Date: 12/29/2017 INDICATION: 47 year old with acute respiratory failure with hypoxia and malignant left pleural effusion. History of breast cancer. EXAM: PLACEMENT OF TUNNELED LEFT PLEURAL CATHETER WITH ULTRASOUND AND FLUOROSCOPIC GUIDANCE MEDICATIONS: Ancef 2 g ANESTHESIA/SEDATION: Fentanyl 150 mcg IV; Versed 3.0 mg IV Moderate Sedation Time:  34 minutes The patient was continuously monitored during the procedure by the interventional radiology nurse under my direct supervision. FLUOROSCOPY TIME:  18 seconds, 11 mGy COMPLICATIONS: None immediate. PROCEDURE: Informed written consent was obtained from the patient after a thorough discussion of the procedural risks, benefits and alternatives. All questions were addressed. Maximal Sterile Barrier Technique was utilized including caps, mask, sterile gowns, sterile gloves, sterile drape, hand hygiene and skin antiseptic. A timeout was performed prior to the initiation of the procedure. Procedure was technically difficult due to patient's respiratory distress. Wedge was placed underneath the patient to keep her upright and she was rolled onto her right side. The left mid axillary region and posterior left chest were prepped and  draped in sterile fashion. Ultrasound demonstrated a large loculated pleural effusion. Skin was anesthetized along the posterior mid axillary line. Small incision was made. Yueh catheter was directed in the pleural space while aspirating with ultrasound guidance. Bloody fluid was aspirated. An incision was made slightly anterior and caudal to the pleural catheter. PleurX catheter was tunneled between the incisions. The cuff was placed underneath the skin. Amplatz wire was advanced through the pleural catheter into the pleural space. Tract was dilated to accommodate a peel-away sheath. PleurX catheter was advanced through the peel-away sheath into the pleural space. Catheter was attached to vacuum bottle.  Approximately 800 mL of bloody fluid was removed. Fluid removal was stopped due to patient coughing. Catheter was sutured to skin with Prolene suture. The pleural entrance site was closed using absorbable suture and Dermabond. Fluoroscopic and ultrasound images were taken and saved for documentation. FINDINGS: Large loculated left pleural effusion. Pleural catheter coiled in the left lower chest. 800 mL of bloody fluid was removed. IMPRESSION: Placement of a tunneled left pleural catheter with ultrasound and fluoroscopic guidance. Electronically Signed   By: Markus Daft M.D.   On: 12/29/2017 17:28   Dg Chest Port 1 View  Result Date: 12/30/2017 CLINICAL DATA:  Pleural effusion. History of renal cancer. History of breast cancer. Surgical clips noted over the right chest. No acute bony abnormality. EXAM: PORTABLE CHEST 1 VIEW COMPARISON:  12/29/2017. FINDINGS: PowerPort catheter noted with tip over the superior vena cava in stable position. Left base pleural catheter in stable position. Heart size normal. Persistent but improved large left pleural effusion. Persistent but improved right lung infiltrate with right apical pleural thickening. IMPRESSION: 1. PowerPort catheter and left base pleural catheter in stable position. No pneumothorax. 2.  Persistent but improved large left pleural effusion. 3. Persistent right lung infiltrates with persistent right apical pleural thickening. No interim change. Electronically Signed   By: Marcello Moores  Register   On: 12/30/2017 07:00   Dg Chest Port 1 View  Result Date: 12/29/2017 CLINICAL DATA:  Status post left pleural catheter placement. EXAM: PORTABLE CHEST 1 VIEW COMPARISON:  12/29/2017 FINDINGS: Left subclavian Port-A-Cath terminates over the upper SVC. A small caliber pleural catheter has been placed and projects over the left lung base. Surgical clips are present in the right axilla. The cardiomediastinal silhouette is grossly unchanged allowing for obscuration of the  left heart border. There is a persistent large, partially loculated left pleural effusion, at most slightly smaller than on the prior study. Consolidation and/or atelectasis remains throughout much of the left lung. Interstitial and patchy airspace opacities in the right lung is not significantly changed. No pneumothorax is identified. IMPRESSION: 1. Interval left pleural catheter placement.  No pneumothorax. 2. Similar appearance of large, partially loculated left pleural effusion. 3. Unchanged interstitial and patchy airspace opacities in the right lung, possibly edema. Electronically Signed   By: Logan Bores M.D.   On: 12/29/2017 17:08   Dg Chest Port 1 View  Result Date: 12/29/2017 CLINICAL DATA:  Dyspnea EXAM: PORTABLE CHEST 1 VIEW COMPARISON:  12/26/2017 FINDINGS: Larger left-sided loculated pleural effusion. Mild interstitial edema, slightly increased in appearance. Stable cardiac and mediastinal contours allowing for partial obscuration due to adjacent loculated left effusion. Left subclavian port catheter tip terminates at the left innominate SVC juncture, unchanged in appearance. Postop change right breast and axilla. IMPRESSION: Larger loculated left-sided pleural effusion with slight increase in mild interstitial edema. Electronically Signed   By: Ashley Royalty M.D.   On: 12/29/2017  01:59        Scheduled Meds: . amLODipine  5 mg Oral Daily  . chlorhexidine  15 mL Mouth Rinse BID  . feeding supplement (ENSURE ENLIVE)  237 mL Oral BID BM  . gabapentin  300 mg Oral BID  . mouth rinse  15 mL Mouth Rinse q12n4p  . multivitamin with minerals  1 tablet Oral Daily  . pantoprazole  40 mg Oral Daily  . potassium chloride SA  20 mEq Oral Daily  . rivaroxaban  20 mg Oral Q supper   Continuous Infusions:   LOS: 1 day    Time spent: 35 minutes    Dana Allan, MD  [facility] Pager #: [phone] 7PM-7AM contact night coverage as above

## 2017-12-31 ENCOUNTER — Inpatient Hospital Stay (HOSPITAL_COMMUNITY): Payer: 59

## 2017-12-31 DIAGNOSIS — J700 Acute pulmonary manifestations due to radiation: Secondary | ICD-10-CM

## 2017-12-31 DIAGNOSIS — R000 Tachycardia, unspecified: Secondary | ICD-10-CM

## 2017-12-31 LAB — CBC
HEMATOCRIT: 27 % — AB (ref 36.0–46.0)
HEMOGLOBIN: 8.5 g/dL — AB (ref 12.0–15.0)
MCH: 26.5 pg (ref 26.0–34.0)
MCHC: 31.5 g/dL (ref 30.0–36.0)
MCV: 84.1 fL (ref 78.0–100.0)
Platelets: 264 10*3/uL (ref 150–400)
RBC: 3.21 MIL/uL — ABNORMAL LOW (ref 3.87–5.11)
RDW: 19.7 % — AB (ref 11.5–15.5)
WBC: 7.5 10*3/uL (ref 4.0–10.5)

## 2017-12-31 LAB — BASIC METABOLIC PANEL
ANION GAP: 9 (ref 5–15)
BUN: 7 mg/dL (ref 6–20)
CALCIUM: 8.2 mg/dL — AB (ref 8.9–10.3)
CO2: 27 mmol/L (ref 22–32)
Chloride: 105 mmol/L (ref 101–111)
Creatinine, Ser: 0.5 mg/dL (ref 0.44–1.00)
GFR calc Af Amer: >60 mL/min (ref >60)
Glucose, Bld: 99 mg/dL (ref 65–99)
POTASSIUM: 3.4 mmol/L — AB (ref 3.5–5.1)
SODIUM: 141 mmol/L (ref 135–145)

## 2017-12-31 LAB — BODY FLUID CELL COUNT WITH DIFFERENTIAL
Eos, Fluid: 1 %
Lymphs, Fluid: 17 %
Monocyte-Macrophage-Serous Fluid: 14 % — ABNORMAL LOW (ref 50–90)
Neutrophil Count, Fluid: 68 % — ABNORMAL HIGH (ref 0–25)
Total Nucleated Cell Count, Fluid: 791 cu mm (ref 0–1000)

## 2017-12-31 LAB — GLUCOSE, PLEURAL OR PERITONEAL FLUID: Glucose, Fluid: 30 mg/dL

## 2017-12-31 MED ORDER — SODIUM CHLORIDE 0.9% FLUSH: 10.0000 mL | INTRAVENOUS | Status: DC | PRN

## 2017-12-31 MED ORDER — DOCUSATE SODIUM 100 MG PO CAPS
100.0000 mg | ORAL_CAPSULE | Freq: Two times a day (BID) | ORAL | Status: DC
  Administered 2017-12-31 – 2018-01-01 (×2): 100 mg via ORAL
  Filled 2017-12-31 (×12): qty 1

## 2017-12-31 MED ORDER — CHLORHEXIDINE GLUCONATE CLOTH 2 % EX PADS
6.0000 | MEDICATED_PAD | Freq: Every day | CUTANEOUS | Status: DC
  Administered 2017-12-31 – 2018-01-06 (×6): 6 via TOPICAL

## 2017-12-31 MED ORDER — DORNASE ALFA 2.5 MG/2.5ML IN SOLN
5.0000 mg | Freq: Once | RESPIRATORY_TRACT | Status: AC
  Administered 2017-12-31: 5 mg via INTRAPLEURAL
  Filled 2017-12-31: qty 5

## 2017-12-31 MED ORDER — METHYLPREDNISOLONE SODIUM SUCC 40 MG IJ SOLR
40.0000 mg | Freq: Four times a day (QID) | INTRAMUSCULAR | Status: DC
  Administered 2017-12-31 – 2018-01-02 (×9): 40 mg via INTRAVENOUS
  Filled 2017-12-31 (×9): qty 1

## 2017-12-31 MED ORDER — SODIUM CHLORIDE 0.9 % IJ SOLN
10.0000 mg | Freq: Once | INTRAMUSCULAR | Status: AC
  Administered 2017-12-31: 10 mg via INTRAPLEURAL
  Filled 2017-12-31: qty 10

## 2017-12-31 MED ORDER — POLYETHYLENE GLYCOL 3350 17 G PO PACK
17.0000 g | PACK | Freq: Once | ORAL | Status: AC
  Administered 2017-12-31: 17 g via ORAL
  Filled 2017-12-31: qty 1

## 2017-12-31 MED ORDER — POTASSIUM CHLORIDE CRYS ER 20 MEQ PO TBCR
20.0000 meq | EXTENDED_RELEASE_TABLET | Freq: Once | ORAL | Status: AC
  Administered 2017-12-31: 20 meq via ORAL
  Filled 2017-12-31: qty 1

## 2017-12-31 MED ORDER — SALINE SPRAY 0.65 % NA SOLN
1.0000 | NASAL | Status: DC | PRN
  Filled 2017-12-31: qty 44

## 2017-12-31 NOTE — Progress Notes (Signed)
Referring Physician(s): Sood,V  Supervising Physician: Markus Daft  Patient Status:  [facility] - In-pt  Chief Complaint:  Dyspnea, recurrent/malignant left pleural effusion  Subjective: Pt still with dyspnea; has some soreness at left pleurx site   Allergies: Sulfa antibiotics  Medications: Prior to Admission medications   Medication Sig Start Date End Date Taking? Authorizing Provider  amLODipine (NORVASC) 5 MG tablet Take 1 tablet (5 mg total) by mouth daily. 12/07/17  Yes Osei-Bonsu, Iona Beard, MD  Black Pepper-Turmeric 3-500 MG CAPS Take 1 capsule by mouth daily. 05/21/16  Yes Nicholas Lose, MD  DM-Doxylamine-Acetaminophen (NYQUIL HBP COLD & FLU) 15-6.25-325 MG/15ML LIQD Take 30 mLs by mouth at bedtime as needed (cough/sleep).   Yes [provider]  gabapentin (NEURONTIN) 300 MG capsule Take 1 capsule (300 mg total) by mouth 3 (three) times daily. Patient taking differently: Take 300 mg by mouth 2 (two) times daily.  09/12/17  Yes Nicholas Lose, MD  lidocaine-prilocaine (EMLA) cream Apply to affected area once Patient taking differently: Apply 1 application topically daily as needed. Pain cream for port 11/26/17  Yes Nicholas Lose, MD  Multiple Vitamins-Minerals (MULTIVITAMIN) tablet Take 1 tablet by mouth daily. 05/21/16  Yes Nicholas Lose, MD  oxyCODONE (OXY IR/ROXICODONE) 5 MG immediate release tablet Take 1-2 tablets (5-10 mg total) by mouth every 4 (four) hours as needed for severe pain. 12/24/17  Yes Nicholas Lose, MD  pantoprazole (PROTONIX) 40 MG tablet Take 1 tablet (40 mg total) by mouth daily. 12/07/17  Yes Osei-Bonsu, Iona Beard, MD  potassium chloride SA (K-DUR,KLOR-CON) 20 MEQ tablet TAKE 1 TABLET(20 MEQ) BY MOUTH DAILY 10/09/17  Yes Nicholas Lose, MD  Probiotic Product (PROBIOTIC PO) Take 1 tablet by mouth daily.   Yes [provider]  rivaroxaban (XARELTO) 20 MG TABS tablet Take 1 tablet (20 mg total) by mouth daily with supper. 09/08/17  Yes Nicholas Lose, MD    traZODone (DESYREL) 100 MG tablet Take 1 tablet (100 mg total) by mouth at bedtime as needed for sleep. 12/07/17  Yes Osei-Bonsu, Iona Beard, MD  amoxicillin-clavulanate (AUGMENTIN) 875-125 MG tablet Take 1 tablet by mouth 2 (two) times daily. Patient not taking: Reported on 12/29/2017 12/18/17   Harle Stanford., PA-C  nitroGLYCERIN (NITROSTAT) 0.4 MG SL tablet Place 1 tablet (0.4 mg total) under the tongue every 5 (five) minutes as needed for chest pain (call MD if not better after 2 doses). 12/07/17   Benito Mccreedy, MD  prochlorperazine (COMPAZINE) 10 MG tablet Take 1 tablet (10 mg total) by mouth every 6 (six) hours as needed for nausea or vomiting. 11/21/17   Kyung Rudd, MD     Vital Signs: BP (!) 157/104   Pulse (!) 137   Temp 97.6 F (36.4 C) (Oral)   Resp (!) 47   Ht 5\' 5"  (1.651 m)   Wt 141 lb 15.6 oz (64.4 kg)   SpO2 98%   BMI 23.63 kg/m   Physical Exam alert; left pleurx cath intact and clamped; small amt bloody fluid in tubing; insertion site mildly tender  Imaging: Ir Lenise Arena W Catheter Placement  Result Date: 12/29/2017 INDICATION: 47 year old with acute respiratory failure with hypoxia and malignant left pleural effusion. History of breast cancer. EXAM: PLACEMENT OF TUNNELED LEFT PLEURAL CATHETER WITH ULTRASOUND AND FLUOROSCOPIC GUIDANCE MEDICATIONS: Ancef 2 g ANESTHESIA/SEDATION: Fentanyl 150 mcg IV; Versed 3.0 mg IV Moderate Sedation Time:  34 minutes The patient was continuously monitored during the procedure by the interventional radiology nurse under my direct supervision. FLUOROSCOPY  TIME:  18 seconds, 11 mGy COMPLICATIONS: None immediate. PROCEDURE: Informed written consent was obtained from the patient after a thorough discussion of the procedural risks, benefits and alternatives. All questions were addressed. Maximal Sterile Barrier Technique was utilized including caps, mask, sterile gowns, sterile gloves, sterile drape, hand hygiene and skin antiseptic. A timeout  was performed prior to the initiation of the procedure. Procedure was technically difficult due to patient's respiratory distress. Wedge was placed underneath the patient to keep her upright and she was rolled onto her right side. The left mid axillary region and posterior left chest were prepped and draped in sterile fashion. Ultrasound demonstrated a large loculated pleural effusion. Skin was anesthetized along the posterior mid axillary line. Small incision was made. Yueh catheter was directed in the pleural space while aspirating with ultrasound guidance. Bloody fluid was aspirated. An incision was made slightly anterior and caudal to the pleural catheter. PleurX catheter was tunneled between the incisions. The cuff was placed underneath the skin. Amplatz wire was advanced through the pleural catheter into the pleural space. Tract was dilated to accommodate a peel-away sheath. PleurX catheter was advanced through the peel-away sheath into the pleural space. Catheter was attached to vacuum bottle. Approximately 800 mL of bloody fluid was removed. Fluid removal was stopped due to patient coughing. Catheter was sutured to skin with Prolene suture. The pleural entrance site was closed using absorbable suture and Dermabond. Fluoroscopic and ultrasound images were taken and saved for documentation. FINDINGS: Large loculated left pleural effusion. Pleural catheter coiled in the left lower chest. 800 mL of bloody fluid was removed. IMPRESSION: Placement of a tunneled left pleural catheter with ultrasound and fluoroscopic guidance. Electronically Signed   By: Markus Daft M.D.   On: 12/29/2017 17:28   Dg Chest Port 1 View  Result Date: 12/31/2017 CLINICAL DATA:  Follow-up left pleural effusion. EXAM: PORTABLE CHEST 1 VIEW COMPARISON:  Portable chest x-ray of December 30, 2017 FINDINGS: The lungs are less well inflated today. There is persistent large left pleural effusion. The small caliber left basilar chest tube appears  to be in stable position. On the right there is hazy increased density accentuated by patient rotation. I cannot exclude a small right pleural effusion. There is stable right apical pleural thickening. Cardiac silhouette is obscured. The pulmonary vascularity is indistinct. The left porta catheter appears to be in stable position. IMPRESSION: Allowing for differences in positioning and the hypoinflation there has not been dramatic interval change in the appearance of the chest since yesterday's study. Electronically Signed   By: David  Martinique M.D.   On: 12/31/2017 08:38   Dg Chest Port 1 View  Result Date: 12/30/2017 CLINICAL DATA:  Pleural effusion. History of renal cancer. History of breast cancer. Surgical clips noted over the right chest. No acute bony abnormality. EXAM: PORTABLE CHEST 1 VIEW COMPARISON:  12/29/2017. FINDINGS: PowerPort catheter noted with tip over the superior vena cava in stable position. Left base pleural catheter in stable position. Heart size normal. Persistent but improved large left pleural effusion. Persistent but improved right lung infiltrate with right apical pleural thickening. IMPRESSION: 1. PowerPort catheter and left base pleural catheter in stable position. No pneumothorax. 2.  Persistent but improved large left pleural effusion. 3. Persistent right lung infiltrates with persistent right apical pleural thickening. No interim change. Electronically Signed   By: Marcello Moores  Register   On: 12/30/2017 07:00   Dg Chest Port 1 View  Result Date: 12/29/2017 CLINICAL DATA:  Status post left  pleural catheter placement. EXAM: PORTABLE CHEST 1 VIEW COMPARISON:  12/29/2017 FINDINGS: Left subclavian Port-A-Cath terminates over the upper SVC. A small caliber pleural catheter has been placed and projects over the left lung base. Surgical clips are present in the right axilla. The cardiomediastinal silhouette is grossly unchanged allowing for obscuration of the left heart border. There is a  persistent large, partially loculated left pleural effusion, at most slightly smaller than on the prior study. Consolidation and/or atelectasis remains throughout much of the left lung. Interstitial and patchy airspace opacities in the right lung is not significantly changed. No pneumothorax is identified. IMPRESSION: 1. Interval left pleural catheter placement.  No pneumothorax. 2. Similar appearance of large, partially loculated left pleural effusion. 3. Unchanged interstitial and patchy airspace opacities in the right lung, possibly edema. Electronically Signed   By: Logan Bores M.D.   On: 12/29/2017 17:08   Dg Chest Port 1 View  Result Date: 12/29/2017 CLINICAL DATA:  Dyspnea EXAM: PORTABLE CHEST 1 VIEW COMPARISON:  12/26/2017 FINDINGS: Larger left-sided loculated pleural effusion. Mild interstitial edema, slightly increased in appearance. Stable cardiac and mediastinal contours allowing for partial obscuration due to adjacent loculated left effusion. Left subclavian port catheter tip terminates at the left innominate SVC juncture, unchanged in appearance. Postop change right breast and axilla. IMPRESSION: Larger loculated left-sided pleural effusion with slight increase in mild interstitial edema. Electronically Signed   By: Ashley Royalty M.D.   On: 12/29/2017 01:59    Labs:  CBC: Recent Labs    12/29/17 0105 12/29/17 0616 12/30/17 0500 12/31/17 0312  WBC 8.3 6.2 5.7 7.5  HGB 9.3* 7.4* 8.4* 8.5*  HCT 29.4* 23.0* 26.1* 27.0*  PLT 314 296 289 264    COAGS: Recent Labs    10/29/17 1147 12/29/17 0105 12/29/17 1128  INR 1.19 1.93 1.40    BMP: Recent Labs    12/24/17 1402 12/29/17 0105 12/29/17 0616 12/31/17 0312  NA 140 138 140 141  K 3.1* 3.1* 3.4* 3.4*  CL 105 101 103 105  CO2 24 25 27 27   GLUCOSE 82 181* 126* 99  BUN 6* 7 6 7   CALCIUM 9.3 8.6* 8.7* 8.2*  CREATININE 0.68 0.61 0.54 0.50  GFRNONAA >60 >60 >60 >60  GFRAA >60 >60 >60 >60    LIVER FUNCTION TESTS: Recent  Labs    12/09/17 1005 12/18/17 1327 12/24/17 1402 12/29/17 0105  BILITOT 0.4 0.7 0.3 0.6  AST 31 33 39* 43*  ALT 25 25 26 30   ALKPHOS 284* 333* 311* 289*  PROT 7.2 7.1 7.2 6.9  ALBUMIN 2.6* 2.4* 2.6* 2.7*    Assessment and Plan: Pt with hx metastatic breast ca with recurrent symptomatic malignant left pleural effsuion; s/p left pleurx cath placement 3/19 with 800 cc bloody fluid removed; CXR today with no sig change and stable positioning of pleurx cath; WBC 7.5, hgb 8.5(8.4), creat 0.50; check pleural fluid cultures; tpa dwell via pleurx per CCM; may need to discuss with TCTS as collection is multiseptated and she is very prone to pulm edema with sig pleural fluid removal   Electronically Signed: D. Rowe Robert, PA-C 12/31/2017, 10:17 AM   I spent a total of 15 minutes at the the patient's bedside AND on the patient's hospital floor or unit, greater than 50% of which was counseling/coordinating care for left chest drain     Patient ID: Phyllis Pruitt, female   DOB: [birth year], 47 y.o.   MRN: [id number]

## 2017-12-31 NOTE — Progress Notes (Signed)
.. ..  Name: Phyllis Pruitt MRN: [id number] DOB: 17-Sep-1971    ADMISSION DATE:  12/29/2017 CONSULTATION DATE:  12/29/17  REFERRING MD :  Christy Gentles MD  CHIEF COMPLAINT:  Shortness of Breath  BRIEF PATIENT DESCRIPTION:  47 yo female with Stage IV breast cancer and recurrent Lt malignant effusion developed hypoxia from progressive effusion.  She has been on pembrolizumab and radiation for breast cancer.  PMHx of HTN, DVT on xarelto.  SUBJECTIVE:  Tmax 100.5.  On/off bipap.  Pt reports dyspnea with exertion.  Off bipap this am.     VITAL SIGNS: BP (!) 157/104   Pulse (!) 137   Temp 98 F (36.7 C) (Oral)   Resp (!) 47   Ht 5\' 5"  (1.651 m)   Wt 141 lb 15.6 oz (64.4 kg)   SpO2 98%   BMI 23.63 kg/m   INTAKE/OUTPUT: I/O last 3 completed shifts: In: -  Out: 875 [Urine:825; Chest Tube:50]  PHYSICAL EXAMINATION: General: adult female in NAD HEENT: MM pink/moist PSY: calm Neuro: AAOx4, speech clear, MAE  CV: s1s2 rrr, no m/r/g PULM: tachypnea but not labored, lungs bilaterally with wheezing/coarse  YO:VZCH, non-tender, bsx4 active  Extremities: warm/dry, no edema.  Minimal movement of RUE / pt reports unchanged, due to known tumor   Skin: no rashes or lesions  CBC Recent Labs    12/29/17 0616 12/30/17 0500 12/31/17 0312  WBC 6.2 5.7 7.5  HGB 7.4* 8.4* 8.5*  HCT 23.0* 26.1* 27.0*  PLT 296 289 264    Coag's Recent Labs    12/29/17 0105 12/29/17 1128  INR 1.93 1.40    BMET Recent Labs    12/29/17 0105 12/29/17 0616 12/31/17 0312  NA 138 140 141  K 3.1* 3.4* 3.4*  CL 101 103 105  CO2 25 27 27   BUN 7 6 7   CREATININE 0.61 0.54 0.50  GLUCOSE 181* 126* 99    Electrolytes Recent Labs    12/29/17 0105 12/29/17 0616 12/31/17 0312  CALCIUM 8.6* 8.7* 8.2*    Sepsis Markers No results for input(s): PROCALCITON, O2SATVEN in the last 72 hours.  Invalid input(s): LACTICACIDVEN  ABG No results for input(s): PHART, PCO2ART, PO2ART in the last 72  hours.  Liver Enzymes Recent Labs    12/29/17 0105  AST 43*  ALT 30  ALKPHOS 289*  BILITOT 0.6  ALBUMIN 2.7*    Cardiac Enzymes No results for input(s): TROPONINI, PROBNP in the last 72 hours.  Glucose No results for input(s): GLUCAP in the last 72 hours.  Imaging Ir Lenise Arena W Catheter Placement  Result Date: 12/29/2017 INDICATION: 47 year old with acute respiratory failure with hypoxia and malignant left pleural effusion. History of breast cancer. EXAM: PLACEMENT OF TUNNELED LEFT PLEURAL CATHETER WITH ULTRASOUND AND FLUOROSCOPIC GUIDANCE MEDICATIONS: Ancef 2 g ANESTHESIA/SEDATION: Fentanyl 150 mcg IV; Versed 3.0 mg IV Moderate Sedation Time:  34 minutes The patient was continuously monitored during the procedure by the interventional radiology nurse under my direct supervision. FLUOROSCOPY TIME:  18 seconds, 11 mGy COMPLICATIONS: None immediate. PROCEDURE: Informed written consent was obtained from the patient after a thorough discussion of the procedural risks, benefits and alternatives. All questions were addressed. Maximal Sterile Barrier Technique was utilized including caps, mask, sterile gowns, sterile gloves, sterile drape, hand hygiene and skin antiseptic. A timeout was performed prior to the initiation of the procedure. Procedure was technically difficult due to patient's respiratory distress. Wedge was placed underneath the patient to keep her upright and she was rolled  onto her right side. The left mid axillary region and posterior left chest were prepped and draped in sterile fashion. Ultrasound demonstrated a large loculated pleural effusion. Skin was anesthetized along the posterior mid axillary line. Small incision was made. Yueh catheter was directed in the pleural space while aspirating with ultrasound guidance. Bloody fluid was aspirated. An incision was made slightly anterior and caudal to the pleural catheter. PleurX catheter was tunneled between the incisions. The  cuff was placed underneath the skin. Amplatz wire was advanced through the pleural catheter into the pleural space. Tract was dilated to accommodate a peel-away sheath. PleurX catheter was advanced through the peel-away sheath into the pleural space. Catheter was attached to vacuum bottle. Approximately 800 mL of bloody fluid was removed. Fluid removal was stopped due to patient coughing. Catheter was sutured to skin with Prolene suture. The pleural entrance site was closed using absorbable suture and Dermabond. Fluoroscopic and ultrasound images were taken and saved for documentation. FINDINGS: Large loculated left pleural effusion. Pleural catheter coiled in the left lower chest. 800 mL of bloody fluid was removed. IMPRESSION: Placement of a tunneled left pleural catheter with ultrasound and fluoroscopic guidance. Electronically Signed   By: Markus Daft M.D.   On: 12/29/2017 17:28   Dg Chest Port 1 View  Result Date: 12/31/2017 CLINICAL DATA:  Follow-up left pleural effusion. EXAM: PORTABLE CHEST 1 VIEW COMPARISON:  Portable chest x-ray of December 30, 2017 FINDINGS: The lungs are less well inflated today. There is persistent large left pleural effusion. The small caliber left basilar chest tube appears to be in stable position. On the right there is hazy increased density accentuated by patient rotation. I cannot exclude a small right pleural effusion. There is stable right apical pleural thickening. Cardiac silhouette is obscured. The pulmonary vascularity is indistinct. The left porta catheter appears to be in stable position. IMPRESSION: Allowing for differences in positioning and the hypoinflation there has not been dramatic interval change in the appearance of the chest since yesterday's study. Electronically Signed   By: David  Martinique M.D.   On: 12/31/2017 08:38   Dg Chest Port 1 View  Result Date: 12/30/2017 CLINICAL DATA:  Pleural effusion. History of renal cancer. History of breast cancer. Surgical  clips noted over the right chest. No acute bony abnormality. EXAM: PORTABLE CHEST 1 VIEW COMPARISON:  12/29/2017. FINDINGS: PowerPort catheter noted with tip over the superior vena cava in stable position. Left base pleural catheter in stable position. Heart size normal. Persistent but improved large left pleural effusion. Persistent but improved right lung infiltrate with right apical pleural thickening. IMPRESSION: 1. PowerPort catheter and left base pleural catheter in stable position. No pneumothorax. 2.  Persistent but improved large left pleural effusion. 3. Persistent right lung infiltrates with persistent right apical pleural thickening. No interim change. Electronically Signed   By: Marcello Moores  Register   On: 12/30/2017 07:00   Dg Chest Port 1 View  Result Date: 12/29/2017 CLINICAL DATA:  Status post left pleural catheter placement. EXAM: PORTABLE CHEST 1 VIEW COMPARISON:  12/29/2017 FINDINGS: Left subclavian Port-A-Cath terminates over the upper SVC. A small caliber pleural catheter has been placed and projects over the left lung base. Surgical clips are present in the right axilla. The cardiomediastinal silhouette is grossly unchanged allowing for obscuration of the left heart border. There is a persistent large, partially loculated left pleural effusion, at most slightly smaller than on the prior study. Consolidation and/or atelectasis remains throughout much of the left lung. Interstitial  and patchy airspace opacities in the right lung is not significantly changed. No pneumothorax is identified. IMPRESSION: 1. Interval left pleural catheter placement.  No pneumothorax. 2. Similar appearance of large, partially loculated left pleural effusion. 3. Unchanged interstitial and patchy airspace opacities in the right lung, possibly edema. Electronically Signed   By: Logan Bores M.D.   On: 12/29/2017 17:08    SIGNIFICANT EVENTS  3/18  Admit with SOB, pleurx by IR  STUDIES ECHO 3/19 >> LV normal size,  LVEF 55-60%, normal wall motion, could not evaluate LV diastolic function, small pericardial effusion circumferential to the heart, doubt tamponade physiology but can not exclude component of constriction.  Large, complex left pleural effusion with multiple thick septations & loculation  DISCUSSION: 47 y/o F with Stage IV Breast CA admitted with SOB.  Now s/p PleurX catheter placement but has residual loculated effusion.  Hx of re-expansion pulmonary edema on prior thora.  Has some improvement in respiratory status after pleurx placement and fluid removal.  Need to be cautious about fluid removal rate given that she had re-expansion pulmonary edema early this month after thoracentesis.  She remains tachycardic and has prominent murmur.  ASSESSMENT / PLAN:  Acute hypoxic respiratory failure. - O2 to support sats >92% - BiPAP PRN for increased work of breathing  - PRN morphine for dyspnea   Complex/loculated Malignant pleural effusion in setting of Stage IV breast cancer. - Drain PleurX catheter as needed for dyspnea relief  - Follow CXR intermittently  - Send pleural fluid for culture to ensure no component of infection  - Will discuss further thora per IR vs intrapleural tPA / pulmozyme  Sinus tachycardia, questionable murmur, hx of HTN. - ECHO as above - Continue Norvasc  Hx of DVT. - Continue Xarelto   Anxiety. - PRN Xanax   DVT prophylaxis - xarelto SUP - protonix Nutrition - regular diet Goals of care - full code   Noe Gens, NP-C [facility] Pgr: [phone] or if no answer [phone] 12/31/2017, 8:46 AM

## 2017-12-31 NOTE — Progress Notes (Signed)
1207 -  tPA instilled into left pleurX catheter.  Clamped for one hour then drained 150 ml red pleural fluid.    1325 -  Pulmozyme instilled into left pleurX catheter.  Catheter drained 500 ml red pleural fluid.  Pt tolerated well.    Plan: Monitor closely for changes in respiratory status.  If new concerns, RN to obtain CXR to assess for pulmonary edema.  Continue PRN BiPAP as needed for patient comfort.     Noe Gens, NP-C [facility] Pgr: [phone] or if no answer [phone] 12/31/2017, 3:48 PM

## 2017-12-31 NOTE — Progress Notes (Signed)
PROGRESS NOTE    Phyllis Pruitt  WFU:[id number] DOB: [birth year] DOA: 12/29/2017 PCP: [name], MD   Brief Narrative: Patient is a 47 year old African-American female with past medical history of stage IV breast cancer with recurrent left-sided pleural effusion .She was admitted for worsening shortness of breath.  She was found to have acute hypoxemic respiratory failure on presentation.  Patient is status post repeat thoracocentesis and underwent left-sided Pleurx catheter placement by IR.  Assessment & Plan:   Principal Problem:   Acute respiratory failure with hypoxia (HCC) Active Problems:   Breast cancer of upper-outer quadrant of right female breast (HCC)   Pleural effusion on left   Tachycardia  Acute respiratory failure with hypoxia: Currently requiring supplemental oxygen for maintenance of saturation.  Oxygen saturation acceptable on nasal cannula.  Acute respiratory failure secondary to large left pleural effusion.  She is off BiPAP.  Still tachypneic. Pulmonary following.  Left pleural effusion: Left recurrent malignant pleural effusion secondary to stage IV breast cancer.  Status post Pleurx catheter placement.  Needs drainage daily.Chest x-ray on presentation had shown large left sided loculated pleural effusion. Repeat chest x-ray has not shown much change ,still shows large left  pleural effusion. I have discussed with IR again today for  possible necessity of further intervention.  Pulmonary following.  Tachycardia: Most likely  associated with hypoxia pain secondary to left pleural effusion. echocardiogram shows ejection fraction of 55-60%, no wall motion abnormality.  Also showed left large complex pleural effusion with septa/loculation.  Status post right-sided breast cancer: Follows with oncology.  History of DVT: Continue Xarelto  Hypertension: Continue current medications.  We will continue to monitor blood pressure.  Anemia: Likely anemia of chronic  disease.  Continue to monitor and transfuse as needed.      DVT prophylaxis: Xarelto Code Status: Full Family Communication: Husband present on the bedside Disposition Plan: Depends upon hospital course   Consultants: Pulmonology, IR  Procedures: Pleurx catheter placement, thoracocentesis  Antimicrobials: None  Subjective: Patient seen and examined the bedside this morning.  Still looks anxious, tachypneic.  She is tachycardic.  Denies any fever or chest pain.  In moderate respiratory distress.  Objective: Vitals:   12/31/17 0400 12/31/17 0500 12/31/17 0800 12/31/17 1000  BP: (!) 130/96  (!) 157/104 (!) 151/104  Pulse: (!) 140 (!) 141 (!) 137 (!) 140  Resp: (!) 33 (!) 47 (!) 47 (!) 28  Temp:   97.6 F (36.4 C)   TempSrc:   Oral   SpO2: 98% 98% 98% 92%  Weight:      Height:        Intake/Output Summary (Last 24 hours) at 12/31/2017 1131 Last data filed at 12/31/2017 1000 Gross per 24 hour  Intake 120 ml  Output 500 ml  Net -380 ml   Filed Weights   12/29/17 0053 12/29/17 0058 12/29/17 0500  Weight: 64.4 kg (142 lb) 63.5 kg (140 lb) 64.4 kg (141 lb 15.6 oz)    Examination:  General exam: In moderate respiratory distress, average built HEENT:PERRL,Oral mucosa moist, Ear/Nose normal on gross exam Respiratory system: Decreased air entry on the left side, bilateral coarse breathing sounds Cardiovascular system: Tachycardia, S1 & S2 heard, RRR. No JVD, murmurs, rubs, gallops or clicks. No pedal edema. Gastrointestinal system: Abdomen is nondistended, soft and nontender. No organomegaly or masses felt. Normal bowel sounds heard. Central nervous system: Alert and oriented. No focal neurological deficits. Extremities: No edema, no clubbing ,no cyanosis, distal peripheral pulses palpable. Skin: No  rashes, lesions or ulcers,no icterus ,no pallor MSK: Normal muscle bulk,tone ,power Psychiatry: Judgement and insight appear normal. Mood & affect appropriate.     Data  Reviewed: I have personally reviewed following labs and imaging studies  CBC: Recent Labs  Lab 12/24/17 1402 12/29/17 0105 12/29/17 0616 12/30/17 0500 12/31/17 0312  WBC 4.3 8.3 6.2 5.7 7.5  NEUTROABS 3.4 7.3  --   --   --   HGB  --  9.3* 7.4* 8.4* 8.5*  HCT 27.8* 29.4* 23.0* 26.1* 27.0*  MCV 81.9 83.3 82.4 83.9 84.1  PLT 234 314 296 289 767   Basic Metabolic Panel: Recent Labs  Lab 12/24/17 1402 12/29/17 0105 12/29/17 0616 12/31/17 0312  NA 140 138 140 141  K 3.1* 3.1* 3.4* 3.4*  CL 105 101 103 105  CO2 24 25 27 27   GLUCOSE 82 181* 126* 99  BUN 6* 7 6 7   CREATININE 0.68 0.61 0.54 0.50  CALCIUM 9.3 8.6* 8.7* 8.2*   GFR: Estimated Creatinine Clearance: 77.4 mL/min (by C-G formula based on SCr of 0.5 mg/dL). Liver Function Tests: Recent Labs  Lab 12/24/17 1402 12/29/17 0105  AST 39* 43*  ALT 26 30  ALKPHOS 311* 289*  BILITOT 0.3 0.6  PROT 7.2 6.9  ALBUMIN 2.6* 2.7*   No results for input(s): LIPASE, AMYLASE in the last 168 hours. No results for input(s): AMMONIA in the last 168 hours. Coagulation Profile: Recent Labs  Lab 12/29/17 0105 12/29/17 1128  INR 1.93 1.40   Cardiac Enzymes: No results for input(s): CKTOTAL, CKMB, CKMBINDEX, TROPONINI in the last 168 hours. BNP (last 3 results) No results for input(s): PROBNP in the last 8760 hours. HbA1C: No results for input(s): HGBA1C in the last 72 hours. CBG: No results for input(s): GLUCAP in the last 168 hours. Lipid Profile: No results for input(s): CHOL, HDL, LDLCALC, TRIG, CHOLHDL, LDLDIRECT in the last 72 hours. Thyroid Function Tests: No results for input(s): TSH, T4TOTAL, FREET4, T3FREE, THYROIDAB in the last 72 hours. Anemia Panel: No results for input(s): VITAMINB12, FOLATE, FERRITIN, TIBC, IRON, RETICCTPCT in the last 72 hours. Sepsis Labs: Recent Labs  Lab 12/29/17 0153  LATICACIDVEN 1.81    Recent Results (from the past 240 hour(s))  MRSA PCR Screening     Status: None   Collection  Time: 12/29/17  5:01 AM  Result Value Ref Range Status   MRSA by PCR NEGATIVE NEGATIVE Final    Comment:        The GeneXpert MRSA Assay (FDA approved for NASAL specimens only), is one component of a comprehensive MRSA colonization surveillance program. It is not intended to diagnose MRSA infection nor to guide or monitor treatment for MRSA infections. Performed at [facility], [place] [address], [place], [place] [address]          Radiology Studies: Ir Lenise Arena W Catheter Placement  Result Date: 12/29/2017 INDICATION: 47 year old with acute respiratory failure with hypoxia and malignant left pleural effusion. History of breast cancer. EXAM: PLACEMENT OF TUNNELED LEFT PLEURAL CATHETER WITH ULTRASOUND AND FLUOROSCOPIC GUIDANCE MEDICATIONS: Ancef 2 g ANESTHESIA/SEDATION: Fentanyl 150 mcg IV; Versed 3.0 mg IV Moderate Sedation Time:  34 minutes The patient was continuously monitored during the procedure by the interventional radiology nurse under my direct supervision. FLUOROSCOPY TIME:  18 seconds, 11 mGy COMPLICATIONS: None immediate. PROCEDURE: Informed written consent was obtained from the patient after a thorough discussion of the procedural risks, benefits and alternatives. All questions were addressed. Maximal Sterile Barrier  Technique was utilized including caps, mask, sterile gowns, sterile gloves, sterile drape, hand hygiene and skin antiseptic. A timeout was performed prior to the initiation of the procedure. Procedure was technically difficult due to patient's respiratory distress. Wedge was placed underneath the patient to keep her upright and she was rolled onto her right side. The left mid axillary region and posterior left chest were prepped and draped in sterile fashion. Ultrasound demonstrated a large loculated pleural effusion. Skin was anesthetized along the posterior mid axillary line. Small incision was made. Yueh catheter was directed in the pleural  space while aspirating with ultrasound guidance. Bloody fluid was aspirated. An incision was made slightly anterior and caudal to the pleural catheter. PleurX catheter was tunneled between the incisions. The cuff was placed underneath the skin. Amplatz wire was advanced through the pleural catheter into the pleural space. Tract was dilated to accommodate a peel-away sheath. PleurX catheter was advanced through the peel-away sheath into the pleural space. Catheter was attached to vacuum bottle. Approximately 800 mL of bloody fluid was removed. Fluid removal was stopped due to patient coughing. Catheter was sutured to skin with Prolene suture. The pleural entrance site was closed using absorbable suture and Dermabond. Fluoroscopic and ultrasound images were taken and saved for documentation. FINDINGS: Large loculated left pleural effusion. Pleural catheter coiled in the left lower chest. 800 mL of bloody fluid was removed. IMPRESSION: Placement of a tunneled left pleural catheter with ultrasound and fluoroscopic guidance. Electronically Signed   By: Markus Daft M.D.   On: 12/29/2017 17:28   Dg Chest Port 1 View  Result Date: 12/31/2017 CLINICAL DATA:  Follow-up left pleural effusion. EXAM: PORTABLE CHEST 1 VIEW COMPARISON:  Portable chest x-ray of December 30, 2017 FINDINGS: The lungs are less well inflated today. There is persistent large left pleural effusion. The small caliber left basilar chest tube appears to be in stable position. On the right there is hazy increased density accentuated by patient rotation. I cannot exclude a small right pleural effusion. There is stable right apical pleural thickening. Cardiac silhouette is obscured. The pulmonary vascularity is indistinct. The left porta catheter appears to be in stable position. IMPRESSION: Allowing for differences in positioning and the hypoinflation there has not been dramatic interval change in the appearance of the chest since yesterday's study.  Electronically Signed   By: David  Martinique M.D.   On: 12/31/2017 08:38   Dg Chest Port 1 View  Result Date: 12/30/2017 CLINICAL DATA:  Pleural effusion. History of renal cancer. History of breast cancer. Surgical clips noted over the right chest. No acute bony abnormality. EXAM: PORTABLE CHEST 1 VIEW COMPARISON:  12/29/2017. FINDINGS: PowerPort catheter noted with tip over the superior vena cava in stable position. Left base pleural catheter in stable position. Heart size normal. Persistent but improved large left pleural effusion. Persistent but improved right lung infiltrate with right apical pleural thickening. IMPRESSION: 1. PowerPort catheter and left base pleural catheter in stable position. No pneumothorax. 2.  Persistent but improved large left pleural effusion. 3. Persistent right lung infiltrates with persistent right apical pleural thickening. No interim change. Electronically Signed   By: Marcello Moores  Register   On: 12/30/2017 07:00   Dg Chest Port 1 View  Result Date: 12/29/2017 CLINICAL DATA:  Status post left pleural catheter placement. EXAM: PORTABLE CHEST 1 VIEW COMPARISON:  12/29/2017 FINDINGS: Left subclavian Port-A-Cath terminates over the upper SVC. A small caliber pleural catheter has been placed and projects over the left lung base. Surgical  clips are present in the right axilla. The cardiomediastinal silhouette is grossly unchanged allowing for obscuration of the left heart border. There is a persistent large, partially loculated left pleural effusion, at most slightly smaller than on the prior study. Consolidation and/or atelectasis remains throughout much of the left lung. Interstitial and patchy airspace opacities in the right lung is not significantly changed. No pneumothorax is identified. IMPRESSION: 1. Interval left pleural catheter placement.  No pneumothorax. 2. Similar appearance of large, partially loculated left pleural effusion. 3. Unchanged interstitial and patchy airspace  opacities in the right lung, possibly edema. Electronically Signed   By: Logan Bores M.D.   On: 12/29/2017 17:08        Scheduled Meds: . alteplase (TPA) for intrapleural administration  10 mg Intrapleural Once   And  . pulmozyme (DORNASE) for intrapleural administration  5 mg Intrapleural Once  . amLODipine  5 mg Oral Daily  . chlorhexidine  15 mL Mouth Rinse BID  . docusate sodium  100 mg Oral BID  . feeding supplement (ENSURE ENLIVE)  237 mL Oral BID BM  . gabapentin  300 mg Oral BID  . mouth rinse  15 mL Mouth Rinse q12n4p  . methylPREDNISolone (SOLU-MEDROL) injection  40 mg Intravenous Q6H  . multivitamin with minerals  1 tablet Oral Daily  . pantoprazole  40 mg Oral Daily  . potassium chloride SA  20 mEq Oral Daily  . rivaroxaban  20 mg Oral Q supper   Continuous Infusions:   LOS: 2 days    Time spent: More than 50% of that time was spent in counseling and/or coordination of care.      Shelly Coss, MD [facility] Pager [phone]  If 7PM-7AM, please contact night-coverage [email or web address] Password [facility] 12/31/2017, 11:31 AM

## 2017-12-31 NOTE — Progress Notes (Signed)
Pt stable and comfortable right now on nasal cannula.  No BiPAP indicated at this time.

## 2018-01-01 ENCOUNTER — Inpatient Hospital Stay (HOSPITAL_COMMUNITY): Payer: 59

## 2018-01-01 ENCOUNTER — Encounter (HOSPITAL_COMMUNITY): Payer: Self-pay | Admitting: Pulmonary Disease

## 2018-01-01 DIAGNOSIS — J700 Acute pulmonary manifestations due to radiation: Secondary | ICD-10-CM

## 2018-01-01 DIAGNOSIS — J869 Pyothorax without fistula: Secondary | ICD-10-CM

## 2018-01-01 DIAGNOSIS — J90 Pleural effusion, not elsewhere classified: Secondary | ICD-10-CM

## 2018-01-01 LAB — BASIC METABOLIC PANEL
ANION GAP: 9 (ref 5–15)
BUN: 12 mg/dL (ref 6–20)
CALCIUM: 9 mg/dL (ref 8.9–10.3)
CO2: 29 mmol/L (ref 22–32)
Chloride: 101 mmol/L (ref 101–111)
Creatinine, Ser: 0.56 mg/dL (ref 0.44–1.00)
GFR calc non Af Amer: >60 mL/min (ref >60)
GLUCOSE: 172 mg/dL — AB (ref 65–99)
POTASSIUM: 4.4 mmol/L (ref 3.5–5.1)
Sodium: 139 mmol/L (ref 135–145)

## 2018-01-01 LAB — PATHOLOGIST SMEAR REVIEW

## 2018-01-01 MED ORDER — VANCOMYCIN HCL 10 G IV SOLR
1500.0000 mg | Freq: Once | INTRAVENOUS | Status: AC
  Administered 2018-01-01: 1500 mg via INTRAVENOUS
  Filled 2018-01-01: qty 1500

## 2018-01-01 MED ORDER — SODIUM CHLORIDE 0.9 % IV SOLN
2.0000 g | Freq: Three times a day (TID) | INTRAVENOUS | Status: DC
  Administered 2018-01-01 – 2018-01-07 (×19): 2 g via INTRAVENOUS
  Filled 2018-01-01 (×21): qty 2

## 2018-01-01 MED ORDER — VANCOMYCIN HCL IN DEXTROSE 750-5 MG/150ML-% IV SOLN
750.0000 mg | Freq: Two times a day (BID) | INTRAVENOUS | Status: DC
  Administered 2018-01-02 (×3): 750 mg via INTRAVENOUS
  Filled 2018-01-01 (×4): qty 150

## 2018-01-01 MED ORDER — RIVAROXABAN 20 MG PO TABS
20.0000 mg | ORAL_TABLET | Freq: Every day | ORAL | Status: DC
  Filled 2018-01-01: qty 1

## 2018-01-01 MED ORDER — STERILE WATER FOR INJECTION IJ SOLN
5.0000 mg | Freq: Once | RESPIRATORY_TRACT | Status: AC
  Administered 2018-01-01: 5 mg via INTRAPLEURAL
  Filled 2018-01-01: qty 5

## 2018-01-01 MED ORDER — SODIUM CHLORIDE 0.9 % IJ SOLN
10.0000 mg | Freq: Once | INTRAMUSCULAR | Status: AC
  Administered 2018-01-01: 10 mg via INTRAPLEURAL
  Filled 2018-01-01: qty 10

## 2018-01-01 NOTE — Progress Notes (Signed)
PleurX catheter site drained 500 ml serosanguinous fluid.  Pulmozyme instilled and catheter clamped.   Catheter to drain at 1407 pm.  Will plan for no more than 300 ml.  Discussed Xarelto with pharmacy, will hold until 3/22 pm dosing.     Noe Gens, NP-C [facility] Pgr: [phone] or if no answer [phone] 01/01/2018, 1:40 PM

## 2018-01-01 NOTE — Progress Notes (Signed)
Pharmacy Antibiotic Note  Phyllis Pruitt is a 47 y.o. female admitted on 12/29/2017 with empyema.  Pharmacy has been consulted for vancomycin and cefepime dosing. Repeat pleural fluid analysis concerning for infection.     Plan: Cefepime 2 gm IV q8h Vancomycin 1500 mg IV loading dose then vancomycin  750 mg IV q12 for est AUC 403 (using SCr 0.6 IBW/TBW) F/u renal function, wbc, temp, culture data Vancomycin levels as needed  Height: 5\' 5"  (165.1 cm) Weight: 140 lb 3.4 oz (63.6 kg) IBW/kg (Calculated) : 57  Temp (24hrs), Avg:99.4 F (37.4 C), Min:97.8 F (36.6 C), Max:101.5 F (38.6 C)  Recent Labs  Lab 12/29/17 0105 12/29/17 0153 12/29/17 0616 12/30/17 0500 12/31/17 0312 01/01/18 0355  WBC 8.3  --  6.2 5.7 7.5  --   CREATININE 0.61  --  0.54  --  0.50 0.56  LATICACIDVEN  --  1.81  --   --   --   --     Estimated Creatinine Clearance: 77.4 mL/min (by C-G formula based on SCr of 0.56 mg/dL).    Allergies  Allergen Reactions  . Sulfa Antibiotics Itching    Vaginal bleeding   Antimicrobials this admission:  Vanc 3/21>> Cefepime 3/21>>  Dose adjustments this admission:   Microbiology results:  3/18 flu neg 3/18 MRSA PCR neg 3/20 pleural fluid>>neg gram stain  Thank you for allowing pharmacy to be a part of this patient's care.  Eudelia Bunch, Pharm.D. [phone] 01/01/2018 10:22 AM

## 2018-01-01 NOTE — Progress Notes (Signed)
.. ..  Name: Phyllis Pruitt MRN: [id number] DOB: [birth year]    ADMISSION DATE:  12/29/2017 CONSULTATION DATE:  12/29/17  REFERRING MD :  Christy Gentles MD  CHIEF COMPLAINT:  Shortness of Breath  BRIEF PATIENT DESCRIPTION:  47 yo female with Stage IV breast cancer and recurrent Lt malignant effusion developed hypoxia from progressive effusion.  She has been on pembrolizumab and radiation for breast cancer.  Repeat pleural fluid analysis concerning for infection.  Also concern for radiation pneumonitis in Rt lung.  PMHx of HTN, DVT on xarelto.  SUBJECTIVE:   Breathing better.  Had fever overnight.  Didn't need Bipap.  VITAL SIGNS: BP (!) 133/92   Pulse (!) 123   Temp 98.7 F (37.1 C) (Oral)   Resp (!) 42   Ht 5\' 5"  (1.651 m)   Wt 140 lb 3.4 oz (63.6 kg)   SpO2 91%   BMI 23.33 kg/m   INTAKE/OUTPUT: I/O last 3 completed shifts: In: 120 [P.O.:120] Out: 1100 [Urine:500; Chest Tube:600]  PHYSICAL EXAMINATION:  General - pleasant Eyes - pupils reactive ENT - no sinus tenderness, no oral exudate, no LAN Cardiac - regular, tachycardia, no murmur Chest - better air movement on Rt, decreased BS on Lt Abd - soft, non tender Ext - no edema Skin - no rashes Neuro - normal strength   CBC Recent Labs    12/30/17 0500 12/31/17 0312  WBC 5.7 7.5  HGB 8.4* 8.5*  HCT 26.1* 27.0*  PLT 289 264    Coag's Recent Labs    12/29/17 1128  INR 1.40    BMET Recent Labs    12/31/17 0312 01/01/18 0355  NA 141 139  K 3.4* 4.4  CL 105 101  CO2 27 29  BUN 7 12  CREATININE 0.50 0.56  GLUCOSE 99 172*    Electrolytes Recent Labs    12/31/17 0312 01/01/18 0355  CALCIUM 8.2* 9.0    Imaging Dg Chest Port 1 View  Result Date: 12/31/2017 CLINICAL DATA:  Follow-up left pleural effusion. EXAM: PORTABLE CHEST 1 VIEW COMPARISON:  Portable chest x-ray of December 30, 2017 FINDINGS: The lungs are less well inflated today. There is persistent large left pleural effusion. The small  caliber left basilar chest tube appears to be in stable position. On the right there is hazy increased density accentuated by patient rotation. I cannot exclude a small right pleural effusion. There is stable right apical pleural thickening. Cardiac silhouette is obscured. The pulmonary vascularity is indistinct. The left porta catheter appears to be in stable position. IMPRESSION: Allowing for differences in positioning and the hypoinflation there has not been dramatic interval change in the appearance of the chest since yesterday's study. Electronically Signed   By: David  Martinique M.D.   On: 12/31/2017 08:38    SIGNIFICANT EVENTS  3/18  Admit with SOB, pleurx by IR 3/20  Instilled tPA and DNAse through pleurx 3/21  Instilled tPA and DNAse through pleurx, add ABx  STUDIES ECHO 3/19 >> EF 55 to 60% Lt pleural fluid 3/20 >> glucose 30, WBC 791 (68% neutrophils)  CULTURES: Lt pleural fluid 3/20 >>  ANTIBIOTICS: Vancomycin 3/20 >>  Cefepime 3/20 >>   DISCUSSION: Has known malignant effusion.  More recent pleural fluid numbers concerning for infectious process.  Improved drainage and symptoms after instilling tPA and DNAse.  Also improved after add solumedrol for radiation pneumonitis.   ASSESSMENT / PLAN:  Acute hypoxic respiratory failure. - oxygen to keep SpO2 > 92% - Bipap prn -  prn morphine for dyspnea  Complex/loculated Malignant pleural effusion in setting of Stage IV breast cancer. - now with concern for empyema - don't think she is surgical candidate in setting of advanced cancer - repeat tPA/DNAse 3/21 - continue daily fluid drainage from pleurx - add vancomycin, cefepime and f/u pleural fluid culture results - f/u CXR  Radiation pneumonitis Rt lung. - continue solumedrol  Sinus tachycardia, HTN. - continue norvasx  Hx of DVT. - Continue Xarelto   Anxiety. - PRN Xanax   DVT prophylaxis - xarelto SUP - protonix Nutrition - regular diet Goals of care - full  code  Chesley Mires, MD [place] 01/01/2018, 9:33 AM Pager:  [phone] After 3pm call: [phone]

## 2018-01-01 NOTE — Progress Notes (Signed)
PROGRESS NOTE    DEAUNA Pruitt  NAT:[id number] DOB: January 24, 1971 DOA: 12/29/2017 PCP: [name], MD   Brief Narrative: Patient is a 47 year old African-American female with past medical history of stage IV breast cancer with recurrent left-sided pleural effusion .She was admitted for worsening shortness of breath.  She was found to have acute hypoxemic respiratory failure on presentation.  Patient is status post repeat thoracocentesis and underwent left-sided Pleurx catheter placement by IR.  Assessment & Plan:   Principal Problem:   Acute respiratory failure with hypoxia (HCC) Active Problems:   Breast cancer of upper-outer quadrant of right female breast (HCC)   Pleural effusion on left   Tachycardia   Loculated pleural effusion   Radiation pneumonitis (HCC)  Acute respiratory failure with hypoxia: Currently requiring supplemental oxygen for maintenance of saturation.  Oxygen saturation acceptable on nasal cannula.  Acute respiratory failure secondary to large left pleural effusion.  She is off BiPAP.  Still tachypneic. Pulmonary following.  Left complex/loculated pleural effusion: Left recurrent malignant pleural effusion secondary to stage IV breast cancer.  Status post Pleurx catheter placement.  Needs drainage daily.Chest x-ray on presentation had shown large left sided loculated pleural effusion. Repeat chest x-ray has not shown much change ,still shows large left  pleural effusion.Pulmonary following.S/P tpA infusion in the pleurx cath twice. Also started on broad-spectrum antibiotics for located pleural effusion. We will follow-up chest x-ray.  We will follow-up pleural fluid culture.  Radiation pneumonitis: Started on Solu-Medrol by pulmonology.  Tachycardia: Most likely  associated with hypoxia ,pain secondary to left pleural effusion. Echocardiogram shows ejection fraction of 55-60%, no wall motion abnormality.  Also showed left large complex pleural effusion with  septa/loculation.  Right-sided breast cancer: Follows with oncology.  History of DVT: Continue Xarelto  Hypertension: Continue current medications.  We will continue to monitor blood pressure.  Anemia: Likely anemia of chronic disease.  Continue to monitor and transfuse as needed.  Anxiety: Continue Xanax PRN      DVT prophylaxis: Xarelto Code Status: Full Family Communication: None present at the bedside Disposition Plan: Depends upon hospital course   Consultants: Pulmonology, IR  Procedures: Pleurx catheter placement, thoracocentesis  Antimicrobials: Vancomycin and cefepime since 01/01/18  Subjective: Patient seen and examined the bedside this morning.  Says that she feels much better today.  Denies any chest pain.  Tachycardia somewhat improved.  Objective: Vitals:   01/01/18 0400 01/01/18 0411 01/01/18 0800 01/01/18 0919  BP: (!) 133/92   (!) 136/97  Pulse: (!) 123     Resp: (!) 42     Temp:  97.8 F (36.6 C) 98.7 F (37.1 C)   TempSrc:  Oral Oral   SpO2: 91%     Weight:  63.6 kg (140 lb 3.4 oz)    Height:        Intake/Output Summary (Last 24 hours) at 01/01/2018 1244 Last data filed at 12/31/2017 1500 Gross per 24 hour  Intake -  Output 500 ml  Net -500 ml   Filed Weights   12/29/17 0058 12/29/17 0500 01/01/18 0411  Weight: 63.5 kg (140 lb) 64.4 kg (141 lb 15.6 oz) 63.6 kg (140 lb 3.4 oz)    Examination:  General exam: Not in distress,average built HEENT:PERRL,Oral mucosa moist, Ear/Nose normal on gross exam Respiratory system: Decreased air entry in the left side , left Pleurx catheter cardiovascular system: Tachycardia, S1 & S2 heard, RRR. No JVD, murmurs, rubs, gallops or clicks. Gastrointestinal system: Abdomen is nondistended, soft and nontender. No  organomegaly or masses felt. Normal bowel sounds heard. Central nervous system: Alert and oriented. No focal neurological deficits. Extremities: No edema, no clubbing ,no cyanosis, distal  peripheral pulses palpable. Skin: No rashes, lesions or ulcers,no icterus ,no pallor MSK: Normal muscle bulk,tone ,power Psychiatry: Judgement and insight appear normal. Mood & affect appropriate.      Data Reviewed: I have personally reviewed following labs and imaging studies  CBC: Recent Labs  Lab 12/29/17 0105 12/29/17 0616 12/30/17 0500 12/31/17 0312  WBC 8.3 6.2 5.7 7.5  NEUTROABS 7.3  --   --   --   HGB 9.3* 7.4* 8.4* 8.5*  HCT 29.4* 23.0* 26.1* 27.0*  MCV 83.3 82.4 83.9 84.1  PLT 314 296 289 469   Basic Metabolic Panel: Recent Labs  Lab 12/29/17 0105 12/29/17 0616 12/31/17 0312 01/01/18 0355  NA 138 140 141 139  K 3.1* 3.4* 3.4* 4.4  CL 101 103 105 101  CO2 25 27 27 29   GLUCOSE 181* 126* 99 172*  BUN 7 6 7 12   CREATININE 0.61 0.54 0.50 0.56  CALCIUM 8.6* 8.7* 8.2* 9.0   GFR: Estimated Creatinine Clearance: 77.4 mL/min (by C-G formula based on SCr of 0.56 mg/dL). Liver Function Tests: Recent Labs  Lab 12/29/17 0105  AST 43*  ALT 30  ALKPHOS 289*  BILITOT 0.6  PROT 6.9  ALBUMIN 2.7*   No results for input(s): LIPASE, AMYLASE in the last 168 hours. No results for input(s): AMMONIA in the last 168 hours. Coagulation Profile: Recent Labs  Lab 12/29/17 0105 12/29/17 1128  INR 1.93 1.40   Cardiac Enzymes: No results for input(s): CKTOTAL, CKMB, CKMBINDEX, TROPONINI in the last 168 hours. BNP (last 3 results) No results for input(s): PROBNP in the last 8760 hours. HbA1C: No results for input(s): HGBA1C in the last 72 hours. CBG: No results for input(s): GLUCAP in the last 168 hours. Lipid Profile: No results for input(s): CHOL, HDL, LDLCALC, TRIG, CHOLHDL, LDLDIRECT in the last 72 hours. Thyroid Function Tests: No results for input(s): TSH, T4TOTAL, FREET4, T3FREE, THYROIDAB in the last 72 hours. Anemia Panel: No results for input(s): VITAMINB12, FOLATE, FERRITIN, TIBC, IRON, RETICCTPCT in the last 72 hours. Sepsis Labs: Recent Labs  Lab  12/29/17 0153  LATICACIDVEN 1.81    Recent Results (from the past 240 hour(s))  MRSA PCR Screening     Status: None   Collection Time: 12/29/17  5:01 AM  Result Value Ref Range Status   MRSA by PCR NEGATIVE NEGATIVE Final    Comment:        The GeneXpert MRSA Assay (FDA approved for NASAL specimens only), is one component of a comprehensive MRSA colonization surveillance program. It is not intended to diagnose MRSA infection nor to guide or monitor treatment for MRSA infections. Performed at [facility], [place] [address] [place], [place] [address]   Body fluid culture     Status: None (Preliminary result)   Collection Time: 12/31/17  2:00 PM  Result Value Ref Range Status   Specimen Description   Final    PLEURAL Performed at [place] [address] [place], [place] [address]    Special Requests   Final    NONE Performed at [facility], [place] [address] [place], [place] [address]    Gram Stain   Final    MODERATE WBC PRESENT, PREDOMINANTLY MONONUCLEAR NO ORGANISMS SEEN    Culture   Final    NO GROWTH < 24  HOURS Performed at [facility], [place] [address] [place], [place] [address]    Report Status PENDING  Incomplete         Radiology Studies: Dg Chest Port 1 View  Result Date: 01/01/2018 CLINICAL DATA:  Follow-up left pleural effusion. EXAM: PORTABLE CHEST 1 VIEW COMPARISON:  Yesterday. FINDINGS: Moderate to large left pleural effusion with a mild decrease in size. Small right pleural effusion, mildly increased. Decreased bilateral airspace opacity. Mild thoracic spine degenerative changes and mild thoracolumbar scoliosis. The heart borders are obscured. IMPRESSION: 1. Mildly decreased size of a moderate to large-sized left pleural effusion. 2. Mild increase in size of a small right pleural effusion. 3. Mildly improved bilateral pneumonia or alveolar edema. Electronically Signed   By: Claudie Revering  M.D.   On: 01/01/2018 10:49   Dg Chest Port 1 View  Result Date: 12/31/2017 CLINICAL DATA:  Follow-up left pleural effusion. EXAM: PORTABLE CHEST 1 VIEW COMPARISON:  Portable chest x-ray of December 30, 2017 FINDINGS: The lungs are less well inflated today. There is persistent large left pleural effusion. The small caliber left basilar chest tube appears to be in stable position. On the right there is hazy increased density accentuated by patient rotation. I cannot exclude a small right pleural effusion. There is stable right apical pleural thickening. Cardiac silhouette is obscured. The pulmonary vascularity is indistinct. The left porta catheter appears to be in stable position. IMPRESSION: Allowing for differences in positioning and the hypoinflation there has not been dramatic interval change in the appearance of the chest since yesterday's study. Electronically Signed   By: David  Martinique M.D.   On: 12/31/2017 08:38        Scheduled Meds: . amLODipine  5 mg Oral Daily  . chlorhexidine  15 mL Mouth Rinse BID  . Chlorhexidine Gluconate Cloth  6 each Topical Daily  . docusate sodium  100 mg Oral BID  . pulmozyme (DORNASE) for intrapleural administration  5 mg Intrapleural Once  . feeding supplement (ENSURE ENLIVE)  237 mL Oral BID BM  . gabapentin  300 mg Oral BID  . mouth rinse  15 mL Mouth Rinse q12n4p  . methylPREDNISolone (SOLU-MEDROL) injection  40 mg Intravenous Q6H  . multivitamin with minerals  1 tablet Oral Daily  . pantoprazole  40 mg Oral Daily  . potassium chloride SA  20 mEq Oral Daily  . [START ON 01/02/2018] rivaroxaban  20 mg Oral Q supper   Continuous Infusions: . ceFEPime (MAXIPIME) IV 2 g (01/01/18 1150)  . vancomycin    . [START ON 01/02/2018] vancomycin       LOS: 3 days    Time spent: More than 50% of that time was spent in counseling and/or coordination of care.      Shelly Coss, MD [facility] Pager [phone]  If 7PM-7AM, please contact  night-coverage [email or web address] Password [facility] 01/01/2018, 12:44 PM

## 2018-01-01 NOTE — Progress Notes (Signed)
tPA to bedside.  Chest tube port cleaned with chlorhexidine.  Instilled tPA into pleurx catheter.  Chest tube flushed with 5cc NS.  Site clamped.  Will dwell for one hour then drain.   Noe Gens, NP-C  Pulmonary & Critical Care Pgr: [phone] or if no answer [phone] 01/01/2018, 11:49 AM

## 2018-01-01 NOTE — Progress Notes (Signed)
PleurX catheter drained 143ml serosanguinous fluid.  Fluid stopped.  Catheter clamped.  Follow up CXR in am.    Noe Gens, NP-C  Pulmonary & Critical Care Pgr: [phone] or if no answer [phone] 01/01/2018, 2:23 PM

## 2018-01-02 ENCOUNTER — Inpatient Hospital Stay (HOSPITAL_COMMUNITY): Payer: 59

## 2018-01-02 LAB — BASIC METABOLIC PANEL
ANION GAP: 8 (ref 5–15)
BUN: 16 mg/dL (ref 6–20)
CO2: 29 mmol/L (ref 22–32)
Calcium: 9 mg/dL (ref 8.9–10.3)
Chloride: 105 mmol/L (ref 101–111)
Creatinine, Ser: 0.55 mg/dL (ref 0.44–1.00)
GFR calc Af Amer: >60 mL/min (ref >60)
GFR calc non Af Amer: >60 mL/min (ref >60)
GLUCOSE: 150 mg/dL — AB (ref 65–99)
POTASSIUM: 5 mmol/L (ref 3.5–5.1)
Sodium: 142 mmol/L (ref 135–145)

## 2018-01-02 LAB — CBC
HEMATOCRIT: 25.8 % — AB (ref 36.0–46.0)
HEMOGLOBIN: 8.1 g/dL — AB (ref 12.0–15.0)
MCH: 26.2 pg (ref 26.0–34.0)
MCHC: 31.4 g/dL (ref 30.0–36.0)
MCV: 83.5 fL (ref 78.0–100.0)
Platelets: 311 10*3/uL (ref 150–400)
RBC: 3.09 MIL/uL — ABNORMAL LOW (ref 3.87–5.11)
RDW: 19.6 % — ABNORMAL HIGH (ref 11.5–15.5)
WBC: 11.2 10*3/uL — ABNORMAL HIGH (ref 4.0–10.5)

## 2018-01-02 MED ORDER — PREDNISONE 20 MG PO TABS
40.0000 mg | ORAL_TABLET | Freq: Every day | ORAL | Status: DC
  Administered 2018-01-03 – 2018-01-07 (×5): 40 mg via ORAL
  Filled 2018-01-02 (×5): qty 2

## 2018-01-02 MED ORDER — APIXABAN 5 MG PO TABS
5.0000 mg | ORAL_TABLET | Freq: Two times a day (BID) | ORAL | Status: DC
  Administered 2018-01-02 – 2018-01-07 (×10): 5 mg via ORAL
  Filled 2018-01-02 (×10): qty 1

## 2018-01-02 NOTE — Care Management Note (Signed)
Case Management Note  Patient Details  Name: ZARIYAH STEPHENS MRN: [id number] Date of Birth: Oct 06, 1971  Subjective/Objective: After calling several HHC agencies-[facility] rep Cheryl([phone]) able to accept for a d/c Monday or thereafter. Await HHRN-pleurx cath drain management order.              Action/Plan:d/c home w/HHC.   Expected Discharge Date:  01/01/18               Expected Discharge Plan:  [place]  In-House Referral:     Discharge planning Services  CM Consult  Post Acute Care Choice:    Choice offered to:     DME Arranged:    DME Agency:  Other - Comment([facility])  HH Arranged:  RN [place] Agency:     Status of Service:  In process, will continue to follow  If discussed at Long Length of Stay Meetings, dates discussed:    Additional Comments:  Dessa Phi, RN 01/02/2018, 3:04 PM

## 2018-01-02 NOTE — Progress Notes (Signed)
CT chest results called to Noe Gens NP .

## 2018-01-02 NOTE — Progress Notes (Signed)
500cc drained from pleurex drain. Patient tolerated this well.Marland Kitchen

## 2018-01-02 NOTE — Progress Notes (Addendum)
..  Name: Phyllis Pruitt MRN: [id number] DOB: 09/29/1971    ADMISSION DATE:  12/29/2017 CONSULTATION DATE:  12/29/17  REFERRING MD :  Christy Gentles MD  CHIEF COMPLAINT:  Shortness of Breath  BRIEF PATIENT DESCRIPTION:  47 yo female with Stage IV breast cancer and recurrent Lt malignant effusion developed hypoxia from progressive effusion.  She has been on pembrolizumab and radiation for breast cancer.  Repeat pleural fluid analysis concerning for infection.  Also concern for radiation pneumonitis in Rt lung.  PMHx of HTN, DVT on xarelto.  SUBJECTIVE:  Pt reports shortness of breath with activity. Feels overall breathing better.  O2 weaned to 4L.   VITAL SIGNS: BP (!) 153/96 (BP Location: Left Arm)   Pulse (!) 123   Temp 98.5 F (36.9 C) (Oral)   Resp (!) 30   Ht 5\' 5"  (1.651 m)   Wt 144 lb 10 oz (65.6 kg)   SpO2 95%   BMI 24.07 kg/m   INTAKE/OUTPUT: I/O last 3 completed shifts: In: 700 [IV Piggyback:700] Out: 1525 [Urine:925; Chest Tube:600]  PHYSICAL EXAMINATION: General: adult female in NAD, sitting up eating breakfast HEENT: MM pink/moist, no jvd PSY: calm/appropriate Neuro: AAOx4, speech clear, MAE CV: s1s2 rrr, no m/r/g PULM: mild tachypnea, non-labored, diminished L lower, clear on R  GI: soft, non-tender, bsx4 active, tolerating PO's  Extremities: warm/dry, no edema  Skin: no rashes or lesions    CBC Recent Labs    12/31/17 0312 01/02/18 0409  WBC 7.5 11.2*  HGB 8.5* 8.1*  HCT 27.0* 25.8*  PLT 264 311    Coag's No results for input(s): APTT, INR in the last 72 hours.  BMET Recent Labs    12/31/17 0312 01/01/18 0355 01/02/18 0409  NA 141 139 142  K 3.4* 4.4 5.0  CL 105 101 105  CO2 27 29 29   BUN 7 12 16   CREATININE 0.50 0.56 0.55  GLUCOSE 99 172* 150*    Electrolytes Recent Labs    12/31/17 0312 01/01/18 0355 01/02/18 0409  CALCIUM 8.2* 9.0 9.0    Imaging Dg Chest Port 1 View  Result Date: 01/02/2018 CLINICAL DATA:  Follow-up  pleural effusion EXAM: PORTABLE CHEST 1 VIEW COMPARISON:  01/01/2018 FINDINGS: Left chest wall port is again seen. Cardiac shadow is stable but partially obscured. Large left pleural effusion is again seen and stable. PleurX catheter is again noted in the left base. No pneumothorax is seen. Patchy airspace opacities are again identified bilaterally and stable. Small right pleural effusion is seen. No bony abnormality is noted. IMPRESSION: Stable bilateral pleural effusions left greater than right. Stable PleurX catheter. Stable bilateral airspace opacities. Electronically Signed   By: Inez Catalina M.D.   On: 01/02/2018 06:55   Dg Chest Port 1 View  Result Date: 01/01/2018 CLINICAL DATA:  Follow-up left pleural effusion. EXAM: PORTABLE CHEST 1 VIEW COMPARISON:  Yesterday. FINDINGS: Moderate to large left pleural effusion with a mild decrease in size. Small right pleural effusion, mildly increased. Decreased bilateral airspace opacity. Mild thoracic spine degenerative changes and mild thoracolumbar scoliosis. The heart borders are obscured. IMPRESSION: 1. Mildly decreased size of a moderate to large-sized left pleural effusion. 2. Mild increase in size of a small right pleural effusion. 3. Mildly improved bilateral pneumonia or alveolar edema. Electronically Signed   By: Claudie Revering M.D.   On: 01/01/2018 10:49    SIGNIFICANT EVENTS  3/18  Admit with SOB, pleurx by IR 3/20  Instilled tPA and DNAse through pleurx 3/21  Instilled tPA and DNAse through pleurx, add ABx  STUDIES ECHO 3/19 >> EF 55 to 60% Lt pleural fluid 3/20 >> glucose 30, WBC 791 (68% neutrophils)  CULTURES: Lt pleural fluid 3/20 >>  ANTIBIOTICS: Vancomycin 3/20 >>  Cefepime 3/20 >>   DISCUSSION: 47 y/o F with metastatic breast cancer admitted with SOB.  Has known malignant effusion.  More recent pleural fluid numbers concerning for infectious process.  Improved drainage and symptoms after instilling tPA and DNAse.  Also improved  after add solumedrol for radiation pneumonitis.   ASSESSMENT / PLAN:  Acute hypoxic respiratory failure. - O2 to keep sats >92% - PRN BiPAP  - PRN morphine for dyspnea - Pulmonary hygiene - IS, OOB/mobilize  Complex/loculated Malignant pleural effusion in setting of Stage IV breast cancer.  Concern for Empyema.  - PleurX drainage as needed - Do not feel she is a surgical candidate in setting of advanced cancer  - repeat CT imaging to review pleural space > decide on further tPA/pulmozyme - continue daily fluid drainage from PleurX, up to 700 ml each time or stop if pt coughing  - ABX as above  - follow plural cultures - f/u CXR   Radiation pneumonitis Rt lung. - change to prednisone 40 mg QD   Sinus tachycardia, HTN. - Norvasc 5mg  QD  Hx of DVT. - Resume Xarelto 3/22 pm    Anxiety. - Xanax PRN   DVT prophylaxis - xarelto SUP - protonix Nutrition - regular diet Goals of care - full code   Noe Gens, NP-C [place] [facility] Pgr: [phone] or if no answer [phone] 01/02/2018, 8:21 AM

## 2018-01-02 NOTE — Care Management Note (Signed)
Case Management Note  Patient Details  Name: Phyllis Pruitt MRN: [id number] Date of Birth: 15-Aug-1971  Subjective/Objective:  Patient has pleurx cath drain-Yellow manela envelope w/care fusion forms in shadow chart, & instructions for ordering 1 box cannisters to take home @ d/c.Will check for [facility] agency for [facility] for pleurx cath drain management.                  Action/Plan:d/c plan home w/HHC.   Expected Discharge Date:  01/01/18               Expected Discharge Plan:  [place]  In-House Referral:     Discharge planning Services  CM Consult  Post Acute Care Choice:    Choice offered to:     DME Arranged:    DME Agency:     HH Arranged:    HH Agency:     Status of Service:  In process, will continue to follow  If discussed at Long Length of Stay Meetings, dates discussed:    Additional Comments:  Dessa Phi, RN 01/02/2018, 1:18 PM

## 2018-01-02 NOTE — Progress Notes (Signed)
PROGRESS NOTE    Phyllis Pruitt  ZOX:[id number] DOB: [birth year] DOA: 12/29/2017 PCP: [name], MD   Brief Narrative: Patient is a 47 year old African-American female with past medical history of stage IV breast cancer with recurrent left-sided pleural effusion .She was admitted for worsening shortness of breath.  She was found to have acute hypoxemic respiratory failure on presentation.  Patient is status post repeat thoracocentesis and underwent left-sided Pleurx catheter placement by IR.  Assessment & Plan:   Principal Problem:   Acute respiratory failure with hypoxia (HCC) Active Problems:   Breast cancer of upper-outer quadrant of right female breast (HCC)   Pleural effusion on left   Tachycardia   Loculated pleural effusion   Radiation pneumonitis (HCC)  Acute respiratory failure with hypoxia: Currently requiring supplemental oxygen for maintenance of saturation.  Oxygen saturation acceptable on nasal cannula.  Acute respiratory failure secondary to large left pleural effusion.  She is off BiPAP.  Respiratory status has improved .pulmonary following.  Left complex/loculated pleural effusion: Left recurrent malignant pleural effusion secondary to stage IV breast cancer.  Status post Pleurx catheter placement.  Needs drainage daily.Chest x-ray on presentation had shown large left sided loculated pleural effusion. Repeat chest x-ray has not shown much change ,still shows large left  pleural effusion.Pulmonary following.S/P tpA infusion in the pleurx cath twice.  She is not a surgical candidate for this  given her comorbidities. Also started on broad-spectrum antibiotics for located pleural effusion. Follow-up chest x-ray shows persistent bilateral pleural effusion more than the left. we will follow-up pleural fluid culture.  Radiation pneumonitis: Started on steroids  by pulmonology.  Tachycardia: Most likely  associated with hypoxia ,pain secondary to left pleural effusion.  Echocardiogram shows ejection fraction of 55-60%, no wall motion abnormality.  Also showed left large complex pleural effusion with septa/loculation.  Right-sided breast cancer: Follows with oncology.  History of DVT: Continue Xarelto  Hypertension: Continue current medications.  We will continue to monitor blood pressure.  Anemia: Likely anemia of chronic disease.  Continue to monitor and transfuse as needed.  Anxiety: Continue Xanax PRN    DVT prophylaxis: Xarelto Code Status: Full Family Communication: None present at the bedside Disposition Plan: Depends upon hospital course   Consultants: Pulmonology, IR  Procedures: Pleurx catheter placement, thoracocentesis  Antimicrobials: Vancomycin and cefepime since 01/01/18  Subjective: Patient seen and examined at bedside this morning.  Continues to feel better.  Respiratory status has improved.  Still requires oxygen at 5 L./min  Objective: Vitals:   01/02/18 0400 01/02/18 0432 01/02/18 0720 01/02/18 0800  BP: (!) 135/91   (!) 153/96  Pulse: (!) 114  (!) 122 (!) 123  Resp: 14  (!) 27 (!) 30  Temp:  98.5 F (36.9 C)  98.6 F (37 C)  TempSrc:  Oral  Oral  SpO2: 99%  94% 95%  Weight:  65.6 kg (144 lb 10 oz)    Height:        Intake/Output Summary (Last 24 hours) at 01/02/2018 1212 Last data filed at 01/02/2018 1030 Gross per 24 hour  Intake 1240 ml  Output 1775 ml  Net -535 ml   Filed Weights   12/29/17 0500 01/01/18 0411 01/02/18 0432  Weight: 64.4 kg (141 lb 15.6 oz) 63.6 kg (140 lb 3.4 oz) 65.6 kg (144 lb 10 oz)    Examination:  General exam:Not in obvious respiratory distress,average built HEENT:PERRL,Oral mucosa moist, Ear/Nose normal on gross exam Respiratory system: Bilateral decreased air entry more on the left  cardiovascular system: Sinus tachycardia,S1 & S2 heard, RRR. No JVD, murmurs, rubs, gallops or clicks. Gastrointestinal system: Abdomen is nondistended, soft and nontender. No organomegaly or  masses felt. Normal bowel sounds heard. Central nervous system: Alert and oriented. No focal neurological deficits. Extremities: No edema, no clubbing ,no cyanosis, distal peripheral pulses palpable. Skin: No rashes, lesions or ulcers,no icterus ,no pallor MSK: Normal muscle bulk,tone ,power Psychiatry: Judgement and insight appear normal. Mood & affect appropriate.         Data Reviewed: I have personally reviewed following labs and imaging studies  CBC: Recent Labs  Lab 12/29/17 0105 12/29/17 0616 12/30/17 0500 12/31/17 0312 01/02/18 0409  WBC 8.3 6.2 5.7 7.5 11.2*  NEUTROABS 7.3  --   --   --   --   HGB 9.3* 7.4* 8.4* 8.5* 8.1*  HCT 29.4* 23.0* 26.1* 27.0* 25.8*  MCV 83.3 82.4 83.9 84.1 83.5  PLT 314 296 289 264 174   Basic Metabolic Panel: Recent Labs  Lab 12/29/17 0105 12/29/17 0616 12/31/17 0312 01/01/18 0355 01/02/18 0409  NA 138 140 141 139 142  K 3.1* 3.4* 3.4* 4.4 5.0  CL 101 103 105 101 105  CO2 25 27 27 29 29   GLUCOSE 181* 126* 99 172* 150*  BUN 7 6 7 12 16   CREATININE 0.61 0.54 0.50 0.56 0.55  CALCIUM 8.6* 8.7* 8.2* 9.0 9.0   GFR: Estimated Creatinine Clearance: 77.4 mL/min (by C-G formula based on SCr of 0.55 mg/dL). Liver Function Tests: Recent Labs  Lab 12/29/17 0105  AST 43*  ALT 30  ALKPHOS 289*  BILITOT 0.6  PROT 6.9  ALBUMIN 2.7*   No results for input(s): LIPASE, AMYLASE in the last 168 hours. No results for input(s): AMMONIA in the last 168 hours. Coagulation Profile: Recent Labs  Lab 12/29/17 0105 12/29/17 1128  INR 1.93 1.40   Cardiac Enzymes: No results for input(s): CKTOTAL, CKMB, CKMBINDEX, TROPONINI in the last 168 hours. BNP (last 3 results) No results for input(s): PROBNP in the last 8760 hours. HbA1C: No results for input(s): HGBA1C in the last 72 hours. CBG: No results for input(s): GLUCAP in the last 168 hours. Lipid Profile: No results for input(s): CHOL, HDL, LDLCALC, TRIG, CHOLHDL, LDLDIRECT in the last  72 hours. Thyroid Function Tests: No results for input(s): TSH, T4TOTAL, FREET4, T3FREE, THYROIDAB in the last 72 hours. Anemia Panel: No results for input(s): VITAMINB12, FOLATE, FERRITIN, TIBC, IRON, RETICCTPCT in the last 72 hours. Sepsis Labs: Recent Labs  Lab 12/29/17 0153  LATICACIDVEN 1.81    Recent Results (from the past 240 hour(s))  MRSA PCR Screening     Status: None   Collection Time: 12/29/17  5:01 AM  Result Value Ref Range Status   MRSA by PCR NEGATIVE NEGATIVE Final    Comment:        The GeneXpert MRSA Assay (FDA approved for NASAL specimens only), is one component of a comprehensive MRSA colonization surveillance program. It is not intended to diagnose MRSA infection nor to guide or monitor treatment for MRSA infections. Performed at [facility], [place] [address] [place], [place] [address]   Body fluid culture     Status: None (Preliminary result)   Collection Time: 12/31/17  2:00 PM  Result Value Ref Range Status   Specimen Description   Final    PLEURAL Performed at [facility] [address], [place], [place] [address]    Special Requests   Final  NONE Performed at [facility], [place] [address], [place], [place] [address]    Gram Stain   Final    MODERATE WBC PRESENT, PREDOMINANTLY MONONUCLEAR NO ORGANISMS SEEN    Culture   Final    NO GROWTH 2 DAYS Performed at [facility] [address], [place], [place] [address]    Report Status PENDING  Incomplete         Radiology Studies: Dg Chest Port 1 View  Result Date: 01/02/2018 CLINICAL DATA:  Follow-up pleural effusion EXAM: PORTABLE CHEST 1 VIEW COMPARISON:  01/01/2018 FINDINGS: Left chest wall port is again seen. Cardiac shadow is stable but partially obscured. Large left pleural effusion is again seen and stable. PleurX catheter is again noted in the left base. No pneumothorax is seen. Patchy airspace opacities are again  identified bilaterally and stable. Small right pleural effusion is seen. No bony abnormality is noted. IMPRESSION: Stable bilateral pleural effusions left greater than right. Stable PleurX catheter. Stable bilateral airspace opacities. Electronically Signed   By: Inez Catalina M.D.   On: 01/02/2018 06:55   Dg Chest Port 1 View  Result Date: 01/01/2018 CLINICAL DATA:  Follow-up left pleural effusion. EXAM: PORTABLE CHEST 1 VIEW COMPARISON:  Yesterday. FINDINGS: Moderate to large left pleural effusion with a mild decrease in size. Small right pleural effusion, mildly increased. Decreased bilateral airspace opacity. Mild thoracic spine degenerative changes and mild thoracolumbar scoliosis. The heart borders are obscured. IMPRESSION: 1. Mildly decreased size of a moderate to large-sized left pleural effusion. 2. Mild increase in size of a small right pleural effusion. 3. Mildly improved bilateral pneumonia or alveolar edema. Electronically Signed   By: Claudie Revering M.D.   On: 01/01/2018 10:49        Scheduled Meds: . amLODipine  5 mg Oral Daily  . chlorhexidine  15 mL Mouth Rinse BID  . Chlorhexidine Gluconate Cloth  6 each Topical Daily  . docusate sodium  100 mg Oral BID  . feeding supplement (ENSURE ENLIVE)  237 mL Oral BID BM  . gabapentin  300 mg Oral BID  . mouth rinse  15 mL Mouth Rinse q12n4p  . multivitamin with minerals  1 tablet Oral Daily  . pantoprazole  40 mg Oral Daily  . [START ON 01/03/2018] predniSONE  40 mg Oral Q breakfast  . rivaroxaban  20 mg Oral Q supper   Continuous Infusions: . ceFEPime (MAXIPIME) IV 2 g (01/02/18 1030)  . vancomycin Stopped (01/02/18 0100)     LOS: 4 days    Time spent: 25 mins.More than 50% of that time was spent in counseling and/or coordination of care.      Shelly Coss, MD [facility] Pager [phone]  If 7PM-7AM, please contact night-coverage [email or web address] Password [email or web address] 01/02/2018, 12:12 PM

## 2018-01-03 ENCOUNTER — Inpatient Hospital Stay (HOSPITAL_COMMUNITY): Payer: 59

## 2018-01-03 DIAGNOSIS — D638 Anemia in other chronic diseases classified elsewhere: Secondary | ICD-10-CM

## 2018-01-03 NOTE — Progress Notes (Addendum)
[facility] PROGRESS NOTE  JERE VANBUREN WVP:[id number] DOB: 29-Oct-1970 DOA: 12/29/2017  PCP: [name], MD  Brief History/Interval Summary: Patient is a 47 year old African-American female with past medical history of stage IV breast cancer with recurrent left-sided pleural effusion .She was admitted for worsening shortness of breath.  She was found to have acute hypoxemic respiratory failure on presentation.  Patient is status post repeat thoracocentesis and underwent left-sided Pleurx catheter placement by IR.  Reason for Visit: Left pleural effusion  Consultants: Pulmonology.  Interventional radiology.  Procedures:  Thoracentesis.  Pleurx catheter placement.  Transthoracic echocardiogram Study Conclusions  - Left ventricle: The cavity size was normal. Wall thickness was   normal. Systolic function was normal. The estimated ejection   fraction was in the range of 55% to 60%. Wall motion was normal;   there were no regional wall motion abnormalities. Due to   tachycardia, there was fusion of early and atrial contributions   to ventricular filling. The study is not technically sufficient   to allow evaluation of LV diastolic function. - Pericardium, extracardiac: A small pericardial effusion was   identified circumferential to the heart. There was a large and   complex left pleural effusion, with multiple thick septations and   loculation..  Impressions:  - Difficult study due to extreme tachycardia and poor echo windows.   There is at most equivocal evidence of increased ventricular   interdependence. Doubt tamponade physiology, but cannot exclude a   component of constriction.   The dominant pathology appears to be the large and complex left   pleural effusion.  Antibiotics: Vancomycin and cefepime  Subjective/Interval History: Patient continues to have some difficulty breathing.  States that her symptoms are worse in the morning hours.  Some dry  cough is present as well.  Discomfort around the drain catheter.  Denies any dizziness or lightheadedness.  Patient also concerned about the swelling in her right arm.  ROS: Denies any nausea or vomiting  Objective:  Vital Signs  Vitals:   01/03/18 0049 01/03/18 0400 01/03/18 0800 01/03/18 0910  BP: (!) 135/98  135/86 135/86  Pulse: (!) 118  (!) 115   Resp: (!) 34  (!) 22   Temp:  98.4 F (36.9 C) 98.3 F (36.8 C)   TempSrc:  Oral Oral   SpO2: 99%  100%   Weight:      Height:        Intake/Output Summary (Last 24 hours) at 01/03/2018 1100 Last data filed at 01/03/2018 1051 Gross per 24 hour  Intake 150 ml  Output 750 ml  Net -600 ml   Filed Weights   12/29/17 0500 01/01/18 0411 01/02/18 0432  Weight: 64.4 kg (141 lb 15.6 oz) 63.6 kg (140 lb 3.4 oz) 65.6 kg (144 lb 10 oz)    General appearance: alert, cooperative and no distress Resp: Diminished air entry at the left base.  Few crackles.  No wheezing.  tachypneic. Cardio: S1-S2 is tachycardic regular.  No S3-S4.  No rubs murmurs or bruit GI: soft, non-tender; bowel sounds normal; no masses,  no organomegaly Extremities: Mild edema of the right upper extremity is noted. Pulses: 2+ and symmetric Neurologic: Awake alert.  Oriented x3.  No focal neurological deficits.  Lab Results:  Data Reviewed: I have personally reviewed following labs and imaging studies  CBC: Recent Labs  Lab 12/29/17 0105 12/29/17 0616 12/30/17 0500 12/31/17 0312 01/02/18 0409  WBC 8.3 6.2 5.7 7.5 11.2*  NEUTROABS 7.3  --   --   --   --  HGB 9.3* 7.4* 8.4* 8.5* 8.1*  HCT 29.4* 23.0* 26.1* 27.0* 25.8*  MCV 83.3 82.4 83.9 84.1 83.5  PLT 314 296 289 264 628    Basic Metabolic Panel: Recent Labs  Lab 12/29/17 0105 12/29/17 0616 12/31/17 0312 01/01/18 0355 01/02/18 0409  NA 138 140 141 139 142  K 3.1* 3.4* 3.4* 4.4 5.0  CL 101 103 105 101 105  CO2 25 27 27 29 29   GLUCOSE 181* 126* 99 172* 150*  BUN 7 6 7 12 16   CREATININE 0.61  0.54 0.50 0.56 0.55  CALCIUM 8.6* 8.7* 8.2* 9.0 9.0    GFR: Estimated Creatinine Clearance: 77.4 mL/min (by C-G formula based on SCr of 0.55 mg/dL).  Liver Function Tests: Recent Labs  Lab 12/29/17 0105  AST 43*  ALT 30  ALKPHOS 289*  BILITOT 0.6  PROT 6.9  ALBUMIN 2.7*     Coagulation Profile: Recent Labs  Lab 12/29/17 0105 12/29/17 1128  INR 1.93 1.40     Recent Results (from the past 240 hour(s))  MRSA PCR Screening     Status: None   Collection Time: 12/29/17  5:01 AM  Result Value Ref Range Status   MRSA by PCR NEGATIVE NEGATIVE Final    Comment:        The GeneXpert MRSA Assay (FDA approved for NASAL specimens only), is one component of a comprehensive MRSA colonization surveillance program. It is not intended to diagnose MRSA infection nor to guide or monitor treatment for MRSA infections. Performed at [facility], [place] [address], [place], [place] [address]   Body fluid culture     Status: None (Preliminary result)   Collection Time: 12/31/17  2:00 PM  Result Value Ref Range Status   Specimen Description   Final    PLEURAL Performed at [facility] [address], [place], [place] [address]    Special Requests   Final    NONE Performed at [facility], [place] [address], [place], [place] [address]    Gram Stain   Final    MODERATE WBC PRESENT, PREDOMINANTLY MONONUCLEAR NO ORGANISMS SEEN    Culture   Final    NO GROWTH 3 DAYS Performed at [facility] [address], [place], [place] [address]    Report Status PENDING  Incomplete      Radiology Studies: Ct Chest Wo Contrast  Result Date: 01/02/2018 CLINICAL DATA:  Increasing shortness of breath and patient with a history of metastatic breast carcinoma. EXAM: CT CHEST WITHOUT CONTRAST TECHNIQUE: Multidetector CT imaging of the chest was performed following the standard protocol without IV contrast. COMPARISON:  CT chest  12/18/2017.  Single-view of the chest today. FINDINGS: Cardiovascular: Heart size is normal. No pericardial effusion. No atherosclerosis. Mediastinum/Nodes: Left supraclavicular lymph node measuring 1 cm short axis dimension on image 10 is unchanged. Right internal mammary lymph node on image 46 of series 2 measuring 0.9 cm is unchanged. No new or enlarging lymph nodes are identified. Lungs/Pleura: Moderately large left pleural effusion has increased in size since the prior CT. A new left pleural drainage catheter is in place. The patient also has a left pneumothorax estimated at 35%. Aeration in the left lung is markedly improved with some hazy residual ground-glass opacity identified. Airspace disease in the right chest has markedly worsened with patchy opacities present in all lobes, worst in the right upper lobe and superior segment of the right lower lobe. Upper Abdomen: Multiple metastatic deposits  in the liver are again seen but better demonstrated on the prior CT where there is IV contrast. No acute abnormality. Musculoskeletal: All imaged bones demonstrate lytic and sclerotic lesions consistent with diffuse metastatic disease. IMPRESSION: Left pleural drainage catheter is new since the prior chest CT. The patient has a left hydropneumothorax. Moderately large effusion has increased since the prior CT. Pneumothorax component is estimated at 35%. Worsened aeration in the right chest with most confluent opacity seen in the superior segment of the right lower lobe worrisome for pneumonia or less likely pulmonary hemorrhage. No change in mild mediastinal lymphadenopathy worrisome for metastatic disease. Diffuse osseous metastases and extensive metastatic disease in the liver appear unchanged. Electronically Signed   By: Inge Rise M.D.   On: 01/02/2018 12:42   Dg Chest Port 1 View  Result Date: 01/03/2018 CLINICAL DATA:  Acute respiratory failure with hypoxia. Pleural effusion. EXAM: PORTABLE CHEST 1  VIEW COMPARISON:  Portable chest and chest CT obtained yesterday. FINDINGS: The left basilar PleurX catheter is unchanged. The previously demonstrated approximately 35% left hydropneumothorax is currently estimated at 50% with a greater air component. Increased patchy opacity in the right perihilar region and right upper lobe. Poorly visualized heart borders. Right axillary surgical clips are again demonstrated. Unremarkable bones. IMPRESSION: 1. Approximately 50% left hydropneumothorax with an increased air component. 2. Increased right lung pneumonia. These results will be called to the ordering clinician or representative by the Radiologist Assistant, and communication documented in the PACS or zVision Dashboard. Electronically Signed   By: Claudie Revering M.D.   On: 01/03/2018 07:10   Dg Chest Port 1 View  Result Date: 01/02/2018 CLINICAL DATA:  Follow-up pleural effusion EXAM: PORTABLE CHEST 1 VIEW COMPARISON:  01/01/2018 FINDINGS: Left chest wall port is again seen. Cardiac shadow is stable but partially obscured. Large left pleural effusion is again seen and stable. PleurX catheter is again noted in the left base. No pneumothorax is seen. Patchy airspace opacities are again identified bilaterally and stable. Small right pleural effusion is seen. No bony abnormality is noted. IMPRESSION: Stable bilateral pleural effusions left greater than right. Stable PleurX catheter. Stable bilateral airspace opacities. Electronically Signed   By: Inez Catalina M.D.   On: 01/02/2018 06:55     Medications:  Scheduled: . amLODipine  5 mg Oral Daily  . apixaban  5 mg Oral BID  . chlorhexidine  15 mL Mouth Rinse BID  . Chlorhexidine Gluconate Cloth  6 each Topical Daily  . docusate sodium  100 mg Oral BID  . feeding supplement (ENSURE ENLIVE)  237 mL Oral BID BM  . gabapentin  300 mg Oral BID  . mouth rinse  15 mL Mouth Rinse q12n4p  . multivitamin with minerals  1 tablet Oral Daily  . pantoprazole  40 mg Oral  Daily  . predniSONE  40 mg Oral Q breakfast   Continuous: . ceFEPime (MAXIPIME) IV 2 g (01/03/18 0917)   ZWC:HENIDPOEUMPNT **OR** acetaminophen, ALPRAZolam, levalbuterol, lip balm, morphine injection, nitroGLYCERIN, ondansetron **OR** ondansetron (ZOFRAN) IV, oxyCODONE, prochlorperazine, sodium chloride, sodium chloride flush, traZODone  Assessment/Plan:  Principal Problem:   Acute respiratory failure with hypoxia (HCC) Active Problems:   Breast cancer of upper-outer quadrant of right female breast (HCC)   Pleural effusion on left   Tachycardia   Loculated pleural effusion   Radiation pneumonitis (HCC)    Acute respiratory failure with hypoxia Secondary to pleural effusion and hydropneumothorax.  Oxygen saturations are in normal range with a supplemental oxygen.  Initially required BiPAP as well and now she is off of it.  Pulmonology is following.  Left-sided complex/loculated pleural effusion She is status post Pleurx catheter placement.  This morning's chest x-ray report and yesterday's CT scan reports reviewed.  It appears that patient has received TPA infusions previously.  Patient was started on vancomycin and cefepime.  Vancomycin discontinued today by pulmonology.  Continuing just on cefepime.  No positive cultures noted.  Further management per pulmonology.    Radiation pneumonitis Started on steroids by pulmonology which is being continued.  Sinus tachycardia Most likely due to respiratory issues.  Echocardiogram showed normal systolic function.  TSH was normal recently.  Continue to monitor for now.  History of DVT DVT was in the right upper extremity.  Patient mentions that the right arm seems more swollen.  She was on Xarelto which was switched over to Eliquis per patient preference.  No recent Doppler studies noted in our system.  We will order one for now.  Right breast cancer Followed by Dr. [name].  Has been getting immunotherapy.  Based on his last note her  disease appears to be getting worse which indicates poor prognosis.  Patient however wants to pursue all options for now.  Essential hypertension Continue to monitor blood pressures.  Noted to be on amlodipine which will be continued.  Anemia of chronic disease Hemoglobin has been stable.  No evidence for overt bleeding.  Continue to check periodically.  Anxiety  Xanax as needed.  DVT Prophylaxis: On Eliquis    Code Status: Full code Family Communication: Discussed with the patient Disposition Plan: Management as outlined above.  She will remain in stepdown unit for now.    LOS: 5 days   [facility] Pager [phone] 01/03/2018, 11:00 AM  If 7PM-7AM, please contact night-coverage at [email or web address], password [facility]

## 2018-01-03 NOTE — Progress Notes (Signed)
..  Name: Phyllis Pruitt MRN: [id number] DOB: November 11, 1970    ADMISSION DATE:  12/29/2017 CONSULTATION DATE:  12/29/17  REFERRING MD :  Christy Gentles MD  CHIEF COMPLAINT:  Shortness of Breath  BRIEF PATIENT DESCRIPTION:  47 yo female with Stage IV breast cancer and recurrent Lt malignant effusion developed hypoxia from progressive effusion.  She has been on pembrolizumab and radiation for breast cancer.  Repeat pleural fluid analysis concerning for infection.  Repeat CT chest showed trapped lung on Lt and infiltrates on Rt concerning for pneumonia versus inflammatory process (?pneumonitis from pembrolizumab).  Also concern for radiation pneumonitis in Rt lung.  PMHx of HTN, DVT on xarelto.  SUBJECTIVE:   Feels better.  Denies chest pain.    VITAL SIGNS: BP 135/86   Pulse (!) 115   Temp 98.3 F (36.8 C) (Oral)   Resp (!) 22   Ht 5\' 5"  (1.651 m)   Wt 144 lb 10 oz (65.6 kg)   SpO2 100%   BMI 24.07 kg/m   INTAKE/OUTPUT: I/O last 3 completed shifts: In: 790 [P.O.:540; IV Piggyback:250] Out: 1250 [Urine:750; Chest Tube:500]  PHYSICAL EXAMINATION:  General - pleasant Eyes - pupils reactive ENT - no sinus tenderness, no oral exudate, no LAN Cardiac - regular, no murmur Chest - decreased BS Lt base Abd - soft, non tender Ext - no edema Skin - no rashes Neuro - normal strength Psych - normal mood  LABS: CBC Recent Labs    01/02/18 0409  WBC 11.2*  HGB 8.1*  HCT 25.8*  PLT 311   BMET Recent Labs    01/01/18 0355 01/02/18 0409  NA 139 142  K 4.4 5.0  CL 101 105  CO2 29 29  BUN 12 16  CREATININE 0.56 0.55  GLUCOSE 172* 150*    Electrolytes Recent Labs    01/01/18 0355 01/02/18 0409  CALCIUM 9.0 9.0    Imaging Ct Chest Wo Contrast  Result Date: 01/02/2018 CLINICAL DATA:  Increasing shortness of breath and patient with a history of metastatic breast carcinoma. EXAM: CT CHEST WITHOUT CONTRAST TECHNIQUE: Multidetector CT imaging of the chest was performed  following the standard protocol without IV contrast. COMPARISON:  CT chest 12/18/2017.  Single-view of the chest today. FINDINGS: Cardiovascular: Heart size is normal. No pericardial effusion. No atherosclerosis. Mediastinum/Nodes: Left supraclavicular lymph node measuring 1 cm short axis dimension on image 10 is unchanged. Right internal mammary lymph node on image 46 of series 2 measuring 0.9 cm is unchanged. No new or enlarging lymph nodes are identified. Lungs/Pleura: Moderately large left pleural effusion has increased in size since the prior CT. A new left pleural drainage catheter is in place. The patient also has a left pneumothorax estimated at 35%. Aeration in the left lung is markedly improved with some hazy residual ground-glass opacity identified. Airspace disease in the right chest has markedly worsened with patchy opacities present in all lobes, worst in the right upper lobe and superior segment of the right lower lobe. Upper Abdomen: Multiple metastatic deposits in the liver are again seen but better demonstrated on the prior CT where there is IV contrast. No acute abnormality. Musculoskeletal: All imaged bones demonstrate lytic and sclerotic lesions consistent with diffuse metastatic disease. IMPRESSION: Left pleural drainage catheter is new since the prior chest CT. The patient has a left hydropneumothorax. Moderately large effusion has increased since the prior CT. Pneumothorax component is estimated at 35%. Worsened aeration in the right chest with most confluent opacity seen in  the superior segment of the right lower lobe worrisome for pneumonia or less likely pulmonary hemorrhage. No change in mild mediastinal lymphadenopathy worrisome for metastatic disease. Diffuse osseous metastases and extensive metastatic disease in the liver appear unchanged. Electronically Signed   By: Inge Rise M.D.   On: 01/02/2018 12:42   Dg Chest Port 1 View  Result Date: 01/03/2018 CLINICAL DATA:  Acute  respiratory failure with hypoxia. Pleural effusion. EXAM: PORTABLE CHEST 1 VIEW COMPARISON:  Portable chest and chest CT obtained yesterday. FINDINGS: The left basilar PleurX catheter is unchanged. The previously demonstrated approximately 35% left hydropneumothorax is currently estimated at 50% with a greater air component. Increased patchy opacity in the right perihilar region and right upper lobe. Poorly visualized heart borders. Right axillary surgical clips are again demonstrated. Unremarkable bones. IMPRESSION: 1. Approximately 50% left hydropneumothorax with an increased air component. 2. Increased right lung pneumonia. These results will be called to the ordering clinician or representative by the Radiologist Assistant, and communication documented in the PACS or zVision Dashboard. Electronically Signed   By: Claudie Revering M.D.   On: 01/03/2018 07:10   Dg Chest Port 1 View  Result Date: 01/02/2018 CLINICAL DATA:  Follow-up pleural effusion EXAM: PORTABLE CHEST 1 VIEW COMPARISON:  01/01/2018 FINDINGS: Left chest wall port is again seen. Cardiac shadow is stable but partially obscured. Large left pleural effusion is again seen and stable. PleurX catheter is again noted in the left base. No pneumothorax is seen. Patchy airspace opacities are again identified bilaterally and stable. Small right pleural effusion is seen. No bony abnormality is noted. IMPRESSION: Stable bilateral pleural effusions left greater than right. Stable PleurX catheter. Stable bilateral airspace opacities. Electronically Signed   By: Inez Catalina M.D.   On: 01/02/2018 06:55      SIGNIFICANT EVENTS  3/18  Admit with SOB, pleurx by IR 3/20  Instilled tPA and DNAse through pleurx, start steroids 3/21  Instilled tPA and DNAse through pleurx, add ABx  STUDIES ECHO 3/19 >> EF 55 to 60% Lt pleural fluid 3/20 >> glucose 30, WBC 791 (68% neutrophils) CT chest 3/22 >> mod Lt effusion, 35% Lt PTX, RUL and superior segment RLL GGO,  metastatic deposits in liver, lytic/sclerotic bone lesions  CULTURES: Lt pleural fluid 3/20 >>  ANTIBIOTICS: Vancomycin 3/20 >> 3/23 Cefepime 3/20 >>   DISCUSSION: She has combination of Lt malignant pleural effusion likely complicated by empyema, Rt lung pneumonia versus pembrolizumab pneumonitis, and Rt lung radiation pneumonitis.  Pleural fluid drainage improved after tPA/DNAase therapy.  Not a surgical candidate at this time for VATS decortication of Lt lung.  Has improvement in respiratory status since the addition of ABx and steroids.  ASSESSMENT / PLAN:  Acute hypoxic respiratory failure. - oxygen to keep SpO2 > 92% - can d/c Bipap from room  Lt pleural effusion from malignancy complicated by empyema. HCAP Rt lung. - s/p pleurx by IR - drain fluid again 3/23 - defer additional tPA/DNAse  - f/u CXR - continue cefepime; d/c vancomycin  Rt lung radiation pneumonitis. - continue prednisone  Stage IV breast cancer. Hx of Lt renal cell carcinoma. - most recently on pembrolizumab   Hx of DVT. - eliquis  Sinus tachycardia. - per primary team  Anxiety related to dyspnea. - Prn xanax, morphine  DVT prophylaxis - eliquis SUP - protonix Nutrition - regular diet Goals of care - full code  Updated pt's family at bedside  Chesley Mires, MD [place] 01/03/2018, 9:03 AM Pager:  [phone] After 3pm call: [phone]

## 2018-01-04 ENCOUNTER — Inpatient Hospital Stay (HOSPITAL_COMMUNITY): Payer: 59

## 2018-01-04 DIAGNOSIS — M7989 Other specified soft tissue disorders: Secondary | ICD-10-CM

## 2018-01-04 LAB — BASIC METABOLIC PANEL
Anion gap: 8 (ref 5–15)
BUN: 13 mg/dL (ref 6–20)
CALCIUM: 8.7 mg/dL — AB (ref 8.9–10.3)
CHLORIDE: 102 mmol/L (ref 101–111)
CO2: 29 mmol/L (ref 22–32)
CREATININE: 0.55 mg/dL (ref 0.44–1.00)
GFR calc Af Amer: >60 mL/min (ref >60)
GFR calc non Af Amer: >60 mL/min (ref >60)
Glucose, Bld: 83 mg/dL (ref 65–99)
Potassium: 3.6 mmol/L (ref 3.5–5.1)
SODIUM: 139 mmol/L (ref 135–145)

## 2018-01-04 LAB — CBC
HCT: 24.7 % — ABNORMAL LOW (ref 36.0–46.0)
HEMOGLOBIN: 8.1 g/dL — AB (ref 12.0–15.0)
MCH: 27.2 pg (ref 26.0–34.0)
MCHC: 32.8 g/dL (ref 30.0–36.0)
MCV: 82.9 fL (ref 78.0–100.0)
PLATELETS: 276 10*3/uL (ref 150–400)
RBC: 2.98 MIL/uL — ABNORMAL LOW (ref 3.87–5.11)
RDW: 19.6 % — AB (ref 11.5–15.5)
WBC: 7.2 10*3/uL (ref 4.0–10.5)

## 2018-01-04 LAB — BODY FLUID CULTURE: Culture: NO GROWTH

## 2018-01-04 NOTE — Progress Notes (Signed)
[facility] PROGRESS NOTE  SILVERIA BOTZ POE:[id number] DOB: 10/21/1970 DOA: 12/29/2017  PCP: [name], MD  Brief History/Interval Summary: Patient is a 47 year old African-American female with past medical history of stage IV breast cancer with recurrent left-sided pleural effusion .She was admitted for worsening shortness of breath.  She was found to have acute hypoxemic respiratory failure on presentation.  Patient is status post repeat thoracocentesis and underwent left-sided Pleurx catheter placement by IR.  Pulmonology continues to follow.  Reason for Visit: Left pleural effusion  Consultants: Pulmonology.  Interventional radiology.  Procedures:  Thoracentesis.  Pleurx catheter placement.  Transthoracic echocardiogram Study Conclusions  - Left ventricle: The cavity size was normal. Wall thickness was   normal. Systolic function was normal. The estimated ejection   fraction was in the range of 55% to 60%. Wall motion was normal;   there were no regional wall motion abnormalities. Due to   tachycardia, there was fusion of early and atrial contributions   to ventricular filling. The study is not technically sufficient   to allow evaluation of LV diastolic function. - Pericardium, extracardiac: A small pericardial effusion was   identified circumferential to the heart. There was a large and   complex left pleural effusion, with multiple thick septations and   loculation..  Impressions:  - Difficult study due to extreme tachycardia and poor echo windows.   There is at most equivocal evidence of increased ventricular   interdependence. Doubt tamponade physiology, but cannot exclude a   component of constriction.   The dominant pathology appears to be the large and complex left   pleural effusion.  Antibiotics: Vancomycin and cefepime  Subjective/Interval History: Patient mentions difficulty breathing this morning.  Right arm swelling is about the same.   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  This is been ongoing for about a year.  No new complaints offered.    ROS: Denies any nausea or vomiting  Objective:  Vital Signs  Vitals:   01/03/18 1613 01/03/18 2001 01/04/18 0400 01/04/18 0405  BP: 140/90  120/81   Pulse: (!) 127  (!) 117   Resp: (!) 34  (!) 22   Temp:  98.7 F (37.1 C)  98.7 F (37.1 C)  TempSrc:  Oral  Oral  SpO2: 100%  100%   Weight:      Height:        Intake/Output Summary (Last 24 hours) at 01/04/2018 0807 Last data filed at 01/04/2018 0200 Gross per 24 hour  Intake 400 ml  Output 1275 ml  Net -875 ml   Filed Weights   12/29/17 0500 01/01/18 0411 01/02/18 0432  Weight: 64.4 kg (141 lb 15.6 oz) 63.6 kg (140 lb 3.4 oz) 65.6 kg (144 lb 10 oz)    General appearance: Awake alert.  In no distress Resp: Diminished air entry bilaterally more so in the left base.  Crackles are present.  No wheezing.  Noted to be tachypneic. Cardio: S1-S2 is tachycardic regular.  No S3-S4.  No rubs murmurs or bruit GI: Abdomen is soft.  Nontender nondistended.  Bowel sounds are present.  No masses organomegaly Extremities: Continues to have mild edema of the right upper extremity.  Good radial pulses. Pulses: 2+ and symmetric Neurologic:  No facial asymmetry.  Tongue is midline.  Has right upper extremity weakness which is old.  Lab Results:  Data Reviewed: I have personally reviewed following labs and imaging studies  CBC: Recent Labs  Lab 12/29/17 0105 12/29/17 0616 12/30/17 0500 12/31/17 0312 01/02/18 0409 01/04/18 0500  WBC 8.3 6.2 5.7 7.5 11.2* 7.2  NEUTROABS 7.3  --   --   --   --   --   HGB 9.3* 7.4* 8.4* 8.5* 8.1* 8.1*  HCT 29.4* 23.0* 26.1* 27.0* 25.8* 24.7*  MCV 83.3 82.4 83.9 84.1 83.5 82.9  PLT 314 296 289 264 311 097    Basic Metabolic Panel: Recent Labs  Lab 12/29/17 0616 12/31/17 0312 01/01/18 0355 01/02/18 0409 01/04/18 0500  NA 140 141 139 142 139  K 3.4*  3.4* 4.4 5.0 3.6  CL 103 105 101 105 102  CO2 27 27 29 29 29   GLUCOSE 126* 99 172* 150* 83  BUN 6 7 12 16 13   CREATININE 0.54 0.50 0.56 0.55 0.55  CALCIUM 8.7* 8.2* 9.0 9.0 8.7*    GFR: Estimated Creatinine Clearance: 77.4 mL/min (by C-G formula based on SCr of 0.55 mg/dL).  Liver Function Tests: Recent Labs  Lab 12/29/17 0105  AST 43*  ALT 30  ALKPHOS 289*  BILITOT 0.6  PROT 6.9  ALBUMIN 2.7*     Coagulation Profile: Recent Labs  Lab 12/29/17 0105 12/29/17 1128  INR 1.93 1.40     Recent Results (from the past 240 hour(s))  MRSA PCR Screening     Status: None   Collection Time: 12/29/17  5:01 AM  Result Value Ref Range Status   MRSA by PCR NEGATIVE NEGATIVE Final    Comment:        The GeneXpert MRSA Assay (FDA approved for NASAL specimens only), is one component of a comprehensive MRSA colonization surveillance program. It is not intended to diagnose MRSA infection nor to guide or monitor treatment for MRSA infections. Performed at [facility], [place] [address], [place], [place] [address]   Body fluid culture     Status: None (Preliminary result)   Collection Time: 12/31/17  2:00 PM  Result Value Ref Range Status   Specimen Description   Final    PLEURAL Performed at [facility] [address], [place], [place] [address]    Special Requests   Final    NONE Performed at [facility], [place] [address], [place], [place] [address]    Gram Stain   Final    MODERATE WBC PRESENT, PREDOMINANTLY MONONUCLEAR NO ORGANISMS SEEN    Culture   Final    NO GROWTH 3 DAYS Performed at [facility] [address], [place],  [address]    Report Status PENDING  Incomplete      Radiology Studies: Ct Chest Wo Contrast  Result Date: 01/02/2018 CLINICAL DATA:  Increasing shortness of breath and patient with a history of metastatic breast carcinoma. EXAM: CT CHEST WITHOUT CONTRAST TECHNIQUE:  Multidetector CT imaging of the chest was performed following the standard protocol without IV contrast. COMPARISON:  CT chest 12/18/2017.  Single-view of the chest today. FINDINGS: Cardiovascular: Heart size is normal. No pericardial effusion. No atherosclerosis. Mediastinum/Nodes: Left supraclavicular lymph node measuring 1 cm short axis dimension on image 10 is unchanged. Right internal mammary lymph node on image 46 of series 2 measuring 0.9 cm is unchanged. No new or enlarging lymph nodes are identified. Lungs/Pleura: Moderately large left pleural effusion has increased in size since the prior CT. A new left pleural drainage catheter is in place. The patient also has a left pneumothorax estimated at 35%. Aeration in the left lung is  markedly improved with some hazy residual ground-glass opacity identified. Airspace disease in the right chest has markedly worsened with patchy opacities present in all lobes, worst in the right upper lobe and superior segment of the right lower lobe. Upper Abdomen: Multiple metastatic deposits in the liver are again seen but better demonstrated on the prior CT where there is IV contrast. No acute abnormality. Musculoskeletal: All imaged bones demonstrate lytic and sclerotic lesions consistent with diffuse metastatic disease. IMPRESSION: Left pleural drainage catheter is new since the prior chest CT. The patient has a left hydropneumothorax. Moderately large effusion has increased since the prior CT. Pneumothorax component is estimated at 35%. Worsened aeration in the right chest with most confluent opacity seen in the superior segment of the right lower lobe worrisome for pneumonia or less likely pulmonary hemorrhage. No change in mild mediastinal lymphadenopathy worrisome for metastatic disease. Diffuse osseous metastases and extensive metastatic disease in the liver appear unchanged. Electronically Signed   By: Inge Rise M.D.   On: 01/02/2018 12:42   Dg Chest Port 1  View  Result Date: 01/04/2018 CLINICAL DATA:  Follow-up left pleural effusion. History of breast cancer and sickle cell disease. EXAM: PORTABLE CHEST 1 VIEW COMPARISON:  01/03/2018. FINDINGS: Approximately 40% left hydropneumothorax is demonstrated with a smaller air component. There has been no significant change in airspace opacity in the right mid upper lung zone and left lower lung zone. Increased loculated pleural fluid superiorly on the right. Borderline enlarged cardiac silhouette. Thoracic spine degenerative changes. Right axillary surgical clips. A left basilar chest tube remains in place. Left subclavian porta catheter tip in the proximal superior vena cava. IMPRESSION: 1. Mild decrease in size of the left hydropneumothorax with a smaller air component. 2. No significant change in overall amount of right lung pneumonia with mildly increased associated pleural fluid. 3. Stable left basilar atelectasis. Electronically Signed   By: Claudie Revering M.D.   On: 01/04/2018 07:14   Dg Chest Port 1 View  Result Date: 01/03/2018 CLINICAL DATA:  Acute respiratory failure with hypoxia. Pleural effusion. EXAM: PORTABLE CHEST 1 VIEW COMPARISON:  Portable chest and chest CT obtained yesterday. FINDINGS: The left basilar PleurX catheter is unchanged. The previously demonstrated approximately 35% left hydropneumothorax is currently estimated at 50% with a greater air component. Increased patchy opacity in the right perihilar region and right upper lobe. Poorly visualized heart borders. Right axillary surgical clips are again demonstrated. Unremarkable bones. IMPRESSION: 1. Approximately 50% left hydropneumothorax with an increased air component. 2. Increased right lung pneumonia. These results will be called to the ordering clinician or representative by the Radiologist Assistant, and communication documented in the PACS or zVision Dashboard. Electronically Signed   By: Claudie Revering M.D.   On: 01/03/2018 07:10      Medications:  Scheduled: . amLODipine  5 mg Oral Daily  . apixaban  5 mg Oral BID  . chlorhexidine  15 mL Mouth Rinse BID  . Chlorhexidine Gluconate Cloth  6 each Topical Daily  . docusate sodium  100 mg Oral BID  . feeding supplement (ENSURE ENLIVE)  237 mL Oral BID BM  . gabapentin  300 mg Oral BID  . mouth rinse  15 mL Mouth Rinse q12n4p  . multivitamin with minerals  1 tablet Oral Daily  . pantoprazole  40 mg Oral Daily  . predniSONE  40 mg Oral Q breakfast   Continuous: . ceFEPime (MAXIPIME) IV Stopped (01/04/18 4166)   AYT:KZSWFUXNATFTD **OR** acetaminophen, ALPRAZolam, levalbuterol, lip balm,  morphine injection, nitroGLYCERIN, ondansetron **OR** ondansetron (ZOFRAN) IV, oxyCODONE, prochlorperazine, sodium chloride, sodium chloride flush, traZODone  Assessment/Plan:  Principal Problem:   Acute respiratory failure with hypoxia (HCC) Active Problems:   Breast cancer of upper-outer quadrant of right female breast (HCC)   Pleural effusion on left   Tachycardia   Loculated pleural effusion   Radiation pneumonitis (HCC)    Acute respiratory failure with hypoxia Secondary to pleural effusion, pneumonia and hydropneumothorax.  Oxygen saturations are in normal range with a supplemental oxygen.  Initially required BiPAP as well and now she is off of it.  Pulmonology is following.  Left-sided complex/loculated malignant pleural effusion She is status post Pleurx catheter placement.  Patient continues to have hydropneumothorax.  Discussed with Dr. Halford Chessman with pulmonology.  She has trapped lung which is the reason for the appearance on imaging studies.  Patient not a surgical candidate.  Pleurx catheter was drained yesterday and to be drained again today.  Patient has received TPA into the pleural cavity.  No further doses to be administered.  Patient was also started on vancomycin and cefepime due to concern for infection.  Vancomycin has been discontinued.  Cefepime is being  continued.  No positive cultures noted.  Further management per pulmonology.  Her metastatic cancer is also contributing.  We will discuss with oncology tomorrow.    Radiation pneumonitis Started on steroids by pulmonology which is being continued.  Sinus tachycardia Most likely due to respiratory issues.  Echocardiogram showed normal systolic function.  TSH was normal recently.  Continue to monitor for now.  History of DVT DVT was in the right upper extremity.  Patient mentions that the right arm seems more swollen.  She was on Xarelto which was switched over to Eliquis per patient preference.  No recent Doppler studies noted in our system.  One was ordered and is pending.    Right breast cancer Followed by Dr. [name].  Has been getting immunotherapy.  Based on his last note her disease appears to be getting worse which indicates poor prognosis.  Patient however wants to pursue all options for now.  Will discuss with oncology tomorrow.  Essential hypertension Continue to monitor blood pressures.  Noted to be on amlodipine which will be continued.  Anemia of chronic disease Hemoglobin has been stable.  No evidence for overt bleeding.  Continue to check periodically.  Anxiety  Xanax as needed.  DVT Prophylaxis: On Eliquis    Code Status: Full code Family Communication: Discussed with the patient Disposition Plan: Management as outlined above.  She will remain in stepdown unit for now.    LOS: 6 days   [facility] Pager [phone] 01/04/2018, 8:07 AM  If 7PM-7AM, please contact night-coverage at [email or web address], password [facility]

## 2018-01-04 NOTE — Progress Notes (Signed)
Pharmacy Antibiotic Note  Phyllis Pruitt is a 47 y.o. female admitted on 12/29/2017 with empyema.  Pharmacy has been consulted for cefepime dosing. Repeat pleural fluid analysis concerning for infection.   01/04/2018 Cefepime D#4. Pleural fluid NGF. WBC WNL, AF. Cr WNL.   Plan: Cefepime 2 gm IV q8h F/u renal function, wbc, temp, culture data ? LOT  Height: 5\' 5"  (165.1 cm) Weight: 144 lb 10 oz (65.6 kg) IBW/kg (Calculated) : 57  Temp (24hrs), Avg:98.4 F (36.9 C), Min:97.5 F (36.4 C), Max:98.8 F (37.1 C)  Recent Labs  Lab 12/29/17 0153 12/29/17 0616 12/30/17 0500 12/31/17 0312 01/01/18 0355 01/02/18 0409 01/04/18 0500  WBC  --  6.2 5.7 7.5  --  11.2* 7.2  CREATININE  --  0.54  --  0.50 0.56 0.55 0.55  LATICACIDVEN 1.81  --   --   --   --   --   --     Estimated Creatinine Clearance: 77.4 mL/min (by C-G formula based on SCr of 0.55 mg/dL).    Allergies  Allergen Reactions  . Sulfa Antibiotics Itching    Vaginal bleeding  Antimicrobials this admission:  Vanc 3/21>>3/23 Cefepime 3/21>>  Dose adjustments this admission:   Microbiology results:  3/18 flu neg 3/18 MRSA PCR neg 3/20 pleural fluid>>neg gram stain, NGF  Thank you for allowing pharmacy to be a part of this patient's care.  Eudelia Bunch, Pharm.D. [phone] 01/04/2018 11:00 AM

## 2018-01-04 NOTE — Progress Notes (Signed)
400cc drained from pleurex drain. The drainage was bloody. Husband was able to perform drainage with RN assist. Patient tolerated this well.

## 2018-01-04 NOTE — Progress Notes (Signed)
..  Name: Phyllis Pruitt MRN: [id number] DOB: 1971-09-22    ADMISSION DATE:  12/29/2017 CONSULTATION DATE:  12/29/2017  REFERRING MD :  Dr. Christy Gentles, ER  CHIEF COMPLAINT:  Shortness of Breath  BRIEF PATIENT DESCRIPTION:  47 yo female with Stage IV breast cancer and recurrent Lt malignant effusion developed hypoxia from progressive effusion.  She has been on pembrolizumab and radiation for breast cancer.  Repeat pleural fluid analysis concerning for infection.  Repeat CT chest showed trapped lung on Lt and infiltrates on Rt concerning for pneumonia versus inflammatory process (?pneumonitis from pembrolizumab).  Also concern for radiation pneumonitis in Rt lung.  PMHx of HTN, DVT on xarelto.  SUBJECTIVE:   Still gets winded with activity.  Feels better after fluid drained.  VITAL SIGNS: BP (!) 144/89 (BP Location: Left Arm)   Pulse (!) 116   Temp 98.5 F (36.9 C) (Oral)   Resp (!) 21   Ht 5\' 5"  (1.651 m)   Wt 144 lb 10 oz (65.6 kg)   SpO2 100%   BMI 24.07 kg/m   INTAKE/OUTPUT: I/O last 3 completed shifts: In: 400 [IV Piggyback:400] Out: 1600 [Urine:925; Chest Tube:675]  PHYSICAL EXAMINATION:  General - pleasant Eyes - pupils reactive ENT - no sinus tenderness, no oral exudate, no LAN Cardiac - regular, no murmur Chest - no wheeze, rales Abd - scattered rhonchi Ext - no edema Skin - no rashes Neuro - normal strength Psych - normal mood   LABS: CBC Recent Labs    01/02/18 0409 01/04/18 0500  WBC 11.2* 7.2  HGB 8.1* 8.1*  HCT 25.8* 24.7*  PLT 311 276   BMET Recent Labs    01/02/18 0409 01/04/18 0500  NA 142 139  K 5.0 3.6  CL 105 102  CO2 29 29  BUN 16 13  CREATININE 0.55 0.55  GLUCOSE 150* 83    Electrolytes Recent Labs    01/02/18 0409 01/04/18 0500  CALCIUM 9.0 8.7*    Imaging Ct Chest Wo Contrast  Result Date: 01/02/2018 CLINICAL DATA:  Increasing shortness of breath and patient with a history of metastatic breast carcinoma. EXAM: CT  CHEST WITHOUT CONTRAST TECHNIQUE: Multidetector CT imaging of the chest was performed following the standard protocol without IV contrast. COMPARISON:  CT chest 12/18/2017.  Single-view of the chest today. FINDINGS: Cardiovascular: Heart size is normal. No pericardial effusion. No atherosclerosis. Mediastinum/Nodes: Left supraclavicular lymph node measuring 1 cm short axis dimension on image 10 is unchanged. Right internal mammary lymph node on image 46 of series 2 measuring 0.9 cm is unchanged. No new or enlarging lymph nodes are identified. Lungs/Pleura: Moderately large left pleural effusion has increased in size since the prior CT. A new left pleural drainage catheter is in place. The patient also has a left pneumothorax estimated at 35%. Aeration in the left lung is markedly improved with some hazy residual ground-glass opacity identified. Airspace disease in the right chest has markedly worsened with patchy opacities present in all lobes, worst in the right upper lobe and superior segment of the right lower lobe. Upper Abdomen: Multiple metastatic deposits in the liver are again seen but better demonstrated on the prior CT where there is IV contrast. No acute abnormality. Musculoskeletal: All imaged bones demonstrate lytic and sclerotic lesions consistent with diffuse metastatic disease. IMPRESSION: Left pleural drainage catheter is new since the prior chest CT. The patient has a left hydropneumothorax. Moderately large effusion has increased since the prior CT. Pneumothorax component is estimated at  35%. Worsened aeration in the right chest with most confluent opacity seen in the superior segment of the right lower lobe worrisome for pneumonia or less likely pulmonary hemorrhage. No change in mild mediastinal lymphadenopathy worrisome for metastatic disease. Diffuse osseous metastases and extensive metastatic disease in the liver appear unchanged. Electronically Signed   By: Inge Rise M.D.   On:  01/02/2018 12:42   Dg Chest Port 1 View  Result Date: 01/04/2018 CLINICAL DATA:  Follow-up left pleural effusion. History of breast cancer and sickle cell disease. EXAM: PORTABLE CHEST 1 VIEW COMPARISON:  01/03/2018. FINDINGS: Approximately 40% left hydropneumothorax is demonstrated with a smaller air component. There has been no significant change in airspace opacity in the right mid upper lung zone and left lower lung zone. Increased loculated pleural fluid superiorly on the right. Borderline enlarged cardiac silhouette. Thoracic spine degenerative changes. Right axillary surgical clips. A left basilar chest tube remains in place. Left subclavian porta catheter tip in the proximal superior vena cava. IMPRESSION: 1. Mild decrease in size of the left hydropneumothorax with a smaller air component. 2. No significant change in overall amount of right lung pneumonia with mildly increased associated pleural fluid. 3. Stable left basilar atelectasis. Electronically Signed   By: Claudie Revering M.D.   On: 01/04/2018 07:14   Dg Chest Port 1 View  Result Date: 01/03/2018 CLINICAL DATA:  Acute respiratory failure with hypoxia. Pleural effusion. EXAM: PORTABLE CHEST 1 VIEW COMPARISON:  Portable chest and chest CT obtained yesterday. FINDINGS: The left basilar PleurX catheter is unchanged. The previously demonstrated approximately 35% left hydropneumothorax is currently estimated at 50% with a greater air component. Increased patchy opacity in the right perihilar region and right upper lobe. Poorly visualized heart borders. Right axillary surgical clips are again demonstrated. Unremarkable bones. IMPRESSION: 1. Approximately 50% left hydropneumothorax with an increased air component. 2. Increased right lung pneumonia. These results will be called to the ordering clinician or representative by the Radiologist Assistant, and communication documented in the PACS or zVision Dashboard. Electronically Signed   By: Claudie Revering  M.D.   On: 01/03/2018 07:10      SIGNIFICANT EVENTS  3/18  Admit with SOB, pleurx by IR 3/20  Instilled tPA and DNAse through pleurx, start steroids 3/21  Instilled tPA and DNAse through pleurx, add ABx  STUDIES ECHO 3/19 >> EF 55 to 60% Lt pleural fluid 3/20 >> glucose 30, WBC 791 (68% neutrophils) CT chest 3/22 >> mod Lt effusion, 35% Lt PTX, RUL and superior segment RLL GGO, metastatic deposits in liver, lytic/sclerotic bone lesions  CULTURES: Lt pleural fluid 3/20 >>  ANTIBIOTICS: Vancomycin 3/20 >> 3/23 Cefepime 3/20 >>   DISCUSSION: She has combination of Lt malignant pleural effusion likely complicated by empyema, Rt lung pneumonia versus pembrolizumab pneumonitis, and Rt lung radiation pneumonitis.  Pleural fluid drainage improved after tPA/DNAase therapy.  Not a surgical candidate at this time for VATS decortication of Lt lung.  Has improvement in respiratory status since the addition of ABx and steroids.  ASSESSMENT / PLAN:  Acute hypoxic respiratory failure. - oxygen to keep SpO2 > 92%  Lt pleural effusion from malignancy complicated by empyema. HCAP Rt lung. - s/p pleurx by IR - drain fluid again 3/24 - f/u CXR - continue cefepime day 5 - defer additional tPA/DNAse   Acute lung injury (HCAP versus immunotherapy induced pneumonitis). Rt lung radiation pneumonitis. - continue prednisone with slow taper  Stage IV breast cancer. Hx of Lt renal cell carcinoma. -  most recently on pembrolizumab  - will need oncology input on treatment options and prognosis, especially if pembrolizumab caused pneumonitis  Hx of DVT. - eliquis  Sinus tachycardia. - per primary team  Anxiety related to dyspnea. - Prn xanax, morphine  DVT prophylaxis - eliquis SUP - protonix Nutrition - regular diet Goals of care - full code  Chesley Mires, MD [place] 01/04/2018, 9:23 AM Pager:  [phone] After 3pm call: [phone]

## 2018-01-04 NOTE — Progress Notes (Addendum)
Right upper extremity venous duplex completed. Patient was diagnosed with a right upper extremity DVT in 2018 at the [facility] in [place] and placed on [place] with complications and was changed to Heparin and is now on Eliquis. There are no studies for comparison in the chart. There is waht appears to be a chronic DVT of the subclavian vein and an age indeterminate DVT of the axillary, and brachial veins. There appears to be an age indeterminate superficial thrombus of the upper arm basilic vein  [facility], [facility] 01/04/2018 3:28 PM

## 2018-01-05 ENCOUNTER — Inpatient Hospital Stay (HOSPITAL_COMMUNITY): Payer: 59

## 2018-01-05 DIAGNOSIS — R000 Tachycardia, unspecified: Secondary | ICD-10-CM

## 2018-01-05 MED ORDER — ENSURE ENLIVE PO LIQD: 237.0000 mL | Freq: Two times a day (BID) | ORAL | Status: DC | PRN

## 2018-01-05 NOTE — Progress Notes (Addendum)
..  Name: Phyllis Pruitt MRN: [id number] DOB: Oct 12, 1971    ADMISSION DATE:  12/29/2017 CONSULTATION DATE:  12/29/2017  REFERRING MD :  Dr. Christy Gentles, ER  CHIEF COMPLAINT:  Shortness of Breath  BRIEF PATIENT DESCRIPTION:  47 yo female with Stage IV breast cancer and recurrent Lt malignant effusion developed hypoxia from progressive effusion.  She has been on pembrolizumab and radiation for breast cancer.  Repeat pleural fluid analysis concerning for infection.  Repeat CT chest showed trapped lung on Lt and infiltrates on Rt concerning for pneumonia versus inflammatory process (?pneumonitis from pembrolizumab).  Also concern for radiation pneumonitis in Rt lung.  PMHx of HTN, DVT on xarelto.  SUBJECTIVE:   Feels better after drainage of fluid  VITAL SIGNS: BP 117/86 (BP Location: Left Arm)   Pulse (!) 124   Temp 99 F (37.2 C) (Oral)   Resp (!) 21   Ht 5\' 5"  (1.651 m)   Wt 144 lb 10 oz (65.6 kg)   SpO2 100%   BMI 24.07 kg/m    INTAKE/OUTPUT: I/O last 3 completed shifts: In: 500 [IV [id number]:921] Out: 2400 [Urine:1050; Chest Tube:1350]  PHYSICAL EXAMINATION:  General - Chronically ill appearing female, NAD Eyes - PERRL, EOM-I and MMM ENT - no sinus tenderness, no oral exudate, no LAN Cardiac - RRR, Nl S1/S2 and -M/R/G Chest - CTA bilaterally, decreased BS on L>R Abd - Soft, NT, ND and +BS Ext - -edema and -tenderness Skin - no rashes Neuro - Alert and interactive, moving all ext to command Psych - normal mood  LABS: CBC Recent Labs    01/04/18 0500  WBC 7.2  HGB 8.1*  HCT 24.7*  PLT 276   BMET Recent Labs    01/04/18 0500  NA 139  K 3.6  CL 102  CO2 29  BUN 13  CREATININE 0.55  GLUCOSE 83   Electrolytes Recent Labs    01/04/18 0500  CALCIUM 8.7*   Imaging Dg Chest Port 1 View  Result Date: 01/05/2018 CLINICAL DATA:  Pleural effusion. EXAM: PORTABLE CHEST 1 VIEW COMPARISON:  Radiograph of January 04, 2018. FINDINGS: Stable cardiomediastinal  silhouette. Left subclavian Port-A-Cath is unchanged in position. Stable minimal left apical pneumothorax is noted. Stable moderate left pleural effusion is noted. Right-sided chest tube is unchanged in position. Stable right upper lobe opacity is noted concerning for pneumonia. Right axillary surgical clips are noted. Bony thorax is unremarkable. IMPRESSION: Stable left hydropneumothorax. Stable position of left-sided chest tube. Stable right upper lobe airspace opacity is noted. Electronically Signed   By: Marijo Conception, M.D.   On: 01/05/2018 07:09   Dg Chest Port 1 View  Result Date: 01/04/2018 CLINICAL DATA:  Follow-up left pleural effusion. History of breast cancer and sickle cell disease. EXAM: PORTABLE CHEST 1 VIEW COMPARISON:  01/03/2018. FINDINGS: Approximately 40% left hydropneumothorax is demonstrated with a smaller air component. There has been no significant change in airspace opacity in the right mid upper lung zone and left lower lung zone. Increased loculated pleural fluid superiorly on the right. Borderline enlarged cardiac silhouette. Thoracic spine degenerative changes. Right axillary surgical clips. A left basilar chest tube remains in place. Left subclavian porta catheter tip in the proximal superior vena cava. IMPRESSION: 1. Mild decrease in size of the left hydropneumothorax with a smaller air component. 2. No significant change in overall amount of right lung pneumonia with mildly increased associated pleural fluid. 3. Stable left basilar atelectasis. Electronically Signed   By: Claudie Revering  M.D.   On: 01/04/2018 07:14   SIGNIFICANT EVENTS  3/18  Admit with SOB, pleurx by IR 3/20  Instilled tPA and DNAse through pleurx, start steroids 3/21  Instilled tPA and DNAse through pleurx, add ABx  STUDIES ECHO 3/19 >> EF 55 to 60% Lt pleural fluid 3/20 >> glucose 30, WBC 791 (68% neutrophils) CT chest 3/22 >> mod Lt effusion, 35% Lt PTX, RUL and superior segment RLL GGO, metastatic  deposits in liver, lytic/sclerotic bone lesions  CULTURES: Lt pleural fluid 3/20 >>  ANTIBIOTICS: Vancomycin 3/20 >> 3/23 Cefepime 3/20 >>   I reviewed CXR myself, residual hydropneumothorax noted, pleurex in place on the left, infiltrate on the right  DISCUSSION: She has combination of Lt malignant pleural effusion likely complicated by empyema, Rt lung pneumonia versus pembrolizumab pneumonitis, and Rt lung radiation pneumonitis.  Pleural fluid drainage improved after tPA/DNAase therapy.  Not a surgical candidate at this time for VATS decortication of Lt lung.  Has improvement in respiratory status since the addition of ABx and steroids.  ASSESSMENT / PLAN:  Acute hypoxic respiratory failure. - Titrate O2 for sat of 88-92% - IS and flutter valve - Ambulate - PT - Hopefully can get O2 down to 2L by the end of today  Lt pleural effusion from malignancy complicated by empyema. HCAP Rt lung. - S/p pleurx by IR - Do not drain fluid today, will change to PRN at this point - F/u CXR if clinical status changes - Continue cefepime day 6/8 - Defer additional tPA/DNAse   Acute lung injury (HCAP versus immunotherapy induced pneumonitis). Rt lung radiation pneumonitis. - Continue prednisone with slow taper  Stage IV breast cancer. Hx of Lt renal cell carcinoma. - Most recently on pembrolizumab  - Will need oncology input on treatment options and prognosis, especially if pembrolizumab caused pneumonitis, will defer to primary to call H/O.  Hx of DVT. - Eliquis  Sinus tachycardia. - Tele monitoring - Add beta blockers if continues and becomes hypertensive  Anxiety related to dyspnea. - Prn xanax, morphine  Will need to determine disposition now since pleurex catheter will remain.  Husband drained it for her today.  On 2L [place] at home, I just dropped her to 2L.  Goal is 2 more days of IV abx then hopefully discharge home if husband is fully trained on use of pleurex  catheter.  Discussed with PCCM-NP  Rush Farmer, M.D. [facility] Pulmonary/Critical Care Medicine. Pager: [phone]. After hours pager: [phone].  01/05/2018, 12:02 PM

## 2018-01-05 NOTE — Progress Notes (Signed)
[facility] PROGRESS NOTE  Phyllis Pruitt PTW:[id number] DOB: [birth year] DOA: 12/29/2017  PCP: [name], MD  Brief History/Interval Summary: Patient is a 47 year old African-American female with past medical history of stage IV breast cancer with recurrent left-sided pleural effusion .She was admitted for worsening shortness of breath.  She was found to have acute hypoxemic respiratory failure on presentation.  Patient is status post thoracocentesis and underwent left-sided Pleurx catheter placement by IR.  Pulmonology continues to follow.  Reason for Visit: Left pleural effusion  Consultants: Pulmonology.  Interventional radiology.  Procedures:  Thoracentesis.  Pleurx catheter placement.  Transthoracic echocardiogram Study Conclusions  - Left ventricle: The cavity size was normal. Wall thickness was   normal. Systolic function was normal. The estimated ejection   fraction was in the range of 55% to 60%. Wall motion was normal;   there were no regional wall motion abnormalities. Due to   tachycardia, there was fusion of early and atrial contributions   to ventricular filling. The study is not technically sufficient   to allow evaluation of LV diastolic function. - Pericardium, extracardiac: A small pericardial effusion was   identified circumferential to the heart. There was a large and   complex left pleural effusion, with multiple thick septations and   loculation..  Impressions:  - Difficult study due to extreme tachycardia and poor echo windows.   There is at most equivocal evidence of increased ventricular   interdependence. Doubt tamponade physiology, but cannot exclude a   component of constriction.   The dominant pathology appears to be the large and complex left   pleural effusion.  RUE Venous Doppler There is waht appears to be a chronic DVT of the subclavian vein and an age indeterminate DVT of the axillary, and brachial veins. There appears to  be an age indeterminate superficial thrombus of the upper arm basilic vein   Antibiotics: Vancomycin and cefepime Vancomycin was discontinued on 3/23  Subjective/Interval History: Patient states that she is feeling better this morning.  Right arm swelling is about the same.  Breathing has improved some.  Denies any chest pain.     ROS: Denies any nausea or vomiting  Objective:  Vital Signs  Vitals:   01/05/18 0500 01/05/18 0600 01/05/18 0700 01/05/18 0739  BP:      Pulse: (!) 110 (!) 110 (!) 111 (!) 124  Resp: 17 19 17  (!) 21  Temp:      TempSrc:      SpO2: 100% 100% 100% 100%  Weight:      Height:        Intake/Output Summary (Last 24 hours) at 01/05/2018 0753 Last data filed at 01/05/2018 0500 Gross per 24 hour  Intake 400 ml  Output 1850 ml  Net -1450 ml   Filed Weights   12/29/17 0500 01/01/18 0411 01/02/18 0432  Weight: 64.4 kg (141 lb 15.6 oz) 63.6 kg (140 lb 3.4 oz) 65.6 kg (144 lb 10 oz)    General appearance: Awake alert.  In no distress Resp: Diminished air entry bilaterally but more so on the left base.  Few crackles.  No wheezing. Cardio: S1-S2 is normal regular.  No S3-S4.  No rubs murmurs or bruit GI: Abdomen remains soft.  Nontender nondistended.  Bowel sounds are present.  No masses organomegaly Extremities: Continues to have mild edema of the right upper extremity with good radial pulses. Neurologic:  No facial asymmetry.  Tongue is midline.  Has right upper extremity weakness which is old.  Lab Results:  Data Reviewed: I have personally reviewed following labs and imaging studies  CBC: Recent Labs  Lab 12/30/17 0500 12/31/17 0312 01/02/18 0409 01/04/18 0500  WBC 5.7 7.5 11.2* 7.2  HGB 8.4* 8.5* 8.1* 8.1*  HCT 26.1* 27.0* 25.8* 24.7*  MCV 83.9 84.1 83.5 82.9  PLT 289 264 311 474    Basic Metabolic Panel: Recent Labs  Lab 12/31/17 0312 01/01/18 0355 01/02/18 0409 01/04/18 0500  NA 141 139 142 139  K 3.4* 4.4 5.0 3.6  CL 105 101  105 102  CO2 27 29 29 29   GLUCOSE 99 172* 150* 83  BUN 7 12 16 13   CREATININE 0.50 0.56 0.55 0.55  CALCIUM 8.2* 9.0 9.0 8.7*    GFR: Estimated Creatinine Clearance: 77.4 mL/min (by C-G formula based on SCr of 0.55 mg/dL).   Coagulation Profile: Recent Labs  Lab 12/29/17 1128  INR 1.40     Recent Results (from the past 240 hour(s))  MRSA PCR Screening     Status: None   Collection Time: 12/29/17  5:01 AM  Result Value Ref Range Status   MRSA by PCR NEGATIVE NEGATIVE Final    Comment:        The GeneXpert MRSA Assay (FDA approved for NASAL specimens only), is one component of a comprehensive MRSA colonization surveillance program. It is not intended to diagnose MRSA infection nor to guide or monitor treatment for MRSA infections. Performed at [facility], [place] [address] [place], [place] [address]   Body fluid culture     Status: None   Collection Time: 12/31/17  2:00 PM  Result Value Ref Range Status   Specimen Description   Final    PLEURAL Performed at [facility], [place] [address] [place], [place] [address]    Special Requests   Final    NONE Performed at [facility], [place] [address], [place], [place] [address]    Gram Stain   Final    MODERATE WBC PRESENT, PREDOMINANTLY MONONUCLEAR NO ORGANISMS SEEN    Culture   Final    NO GROWTH 3 DAYS Performed at [place] [address], [place], [place] [address]    Report Status 01/04/2018 FINAL  Final      Radiology Studies: Dg Chest Port 1 View  Result Date: 01/05/2018 CLINICAL DATA:  Pleural effusion. EXAM: PORTABLE CHEST 1 VIEW COMPARISON:  Radiograph of January 04, 2018. FINDINGS: Stable cardiomediastinal silhouette. Left subclavian Port-A-Cath is unchanged in position. Stable minimal left apical pneumothorax is noted. Stable moderate left pleural effusion is noted. Right-sided chest tube is unchanged in position. Stable right upper lobe  opacity is noted concerning for pneumonia. Right axillary surgical clips are noted. Bony thorax is unremarkable. IMPRESSION: Stable left hydropneumothorax. Stable position of left-sided chest tube. Stable right upper lobe airspace opacity is noted. Electronically Signed   By: Marijo Conception, M.D.   On: 01/05/2018 07:09   Dg Chest Port 1 View  Result Date: 01/04/2018 CLINICAL DATA:  Follow-up left pleural effusion. History of breast cancer and sickle cell disease. EXAM: PORTABLE CHEST 1 VIEW COMPARISON:  01/03/2018. FINDINGS: Approximately 40% left hydropneumothorax is demonstrated with a smaller air component. There has been no significant change in airspace opacity in the right mid upper lung zone and left lower lung zone. Increased loculated pleural fluid superiorly on the right. Borderline enlarged cardiac silhouette. Thoracic spine degenerative changes. Right axillary surgical clips. A left basilar chest tube  remains in place. Left subclavian porta catheter tip in the proximal superior vena cava. IMPRESSION: 1. Mild decrease in size of the left hydropneumothorax with a smaller air component. 2. No significant change in overall amount of right lung pneumonia with mildly increased associated pleural fluid. 3. Stable left basilar atelectasis. Electronically Signed   By: Claudie Revering M.D.   On: 01/04/2018 07:14     Medications:  Scheduled: . amLODipine  5 mg Oral Daily  . apixaban  5 mg Oral BID  . chlorhexidine  15 mL Mouth Rinse BID  . Chlorhexidine Gluconate Cloth  6 each Topical Daily  . docusate sodium  100 mg Oral BID  . feeding supplement (ENSURE ENLIVE)  237 mL Oral BID BM  . gabapentin  300 mg Oral BID  . mouth rinse  15 mL Mouth Rinse q12n4p  . multivitamin with minerals  1 tablet Oral Daily  . pantoprazole  40 mg Oral Daily  . predniSONE  40 mg Oral Q breakfast   Continuous: . ceFEPime (MAXIPIME) IV Stopped (01/05/18 0237)   SAY:[id number] **OR** acetaminophen, ALPRAZolam,  levalbuterol, lip balm, morphine injection, nitroGLYCERIN, ondansetron **OR** ondansetron (ZOFRAN) IV, oxyCODONE, prochlorperazine, sodium chloride, sodium chloride flush, traZODone  Assessment/Plan:  Principal Problem:   Acute respiratory failure with hypoxia (HCC) Active Problems:   Breast cancer of upper-outer quadrant of right female breast (HCC)   Pleural effusion on left   Tachycardia   Loculated pleural effusion   Radiation pneumonitis (HCC)    Acute respiratory failure with hypoxia Secondary to pleural effusion, pneumonia and hydropneumothorax.  Patient remains stable.  Oxygenating well with supplemental oxygen.  She did require BiPAP in the early part of this admission but has been off of it for the past 3-4 days.  Neurology continues to follow.    Left-sided complex/loculated malignant pleural effusion She is status post Pleurx catheter placement.  Patient continues to have hydropneumothorax.  She has trapped lung which is the reason for the appearance on imaging studies.  Patient not a surgical candidate per pulmonology.  Pleurx catheter has been drained daily for the last 3 days.  Patient was initially started on vancomycin and cefepime.  She was taken off of vancomycin 2 days ago.  Remains on cefepime.  No positive cultures noted.  Her metastatic cancer is also contributing.  Further management per pulmonology.     Radiation pneumonitis Started on steroids by pulmonology which is being continued.  Currently on prednisone.  Sinus tachycardia Most likely due to respiratory issues.  Echocardiogram showed normal systolic function.  TSH was normal recently.  Continue to monitor for now.  History of DVT DVT was in the right upper extremity.  This was diagnosed at [facility] [place] in [place].  She mentioned that her right arm seemed more swollen.  She had been on Xarelto previously.  Switched over to Eliquis per patient preference.  Doppler studies done yesterday  shows findings suggestive of chronic thrombus.  Will not to recommend any changes to treatment at this time.  Keep arm elevated as much as possible.     Right breast cancer Followed by Dr. [name].  Has been getting immunotherapy.  Based on his last note her disease appears to be getting worse which indicates poor prognosis.  Patient however wants to pursue all options for now.  Will need to discuss with oncology and get their input considering her current medical condition.  Essential hypertension Continue to monitor blood pressures.  Noted to be on  amlodipine which will be continued.  Anemia of chronic disease Hemoglobin has been stable.  No evidence for overt bleeding.  Continue to check periodically.  Anxiety  Xanax as needed.  DVT Prophylaxis: On Eliquis    Code Status: Full code Family Communication: Discussed with the patient Disposition Plan: Management as outlined above.  Await further pulmonology input.  Await oncology input.   LOS: 7 days   [facility] Pager [phone] 01/05/2018, 7:53 AM  If 7PM-7AM, please contact night-coverage at [email or web address], password [facility]

## 2018-01-05 NOTE — Progress Notes (Addendum)
Nutrition Follow-up  DOCUMENTATION CODES:   Not applicable  INTERVENTION:  - Will change Ensure Enlive to BID PRN.  - Continue to encourage PO intakes.   NUTRITION DIAGNOSIS:   Increased nutrient needs related to cancer and cancer related treatments as evidenced by estimated needs. -ongoing  GOAL:   Patient will meet greater than or equal to 90% of their needs -minimally met on average.   MONITOR:   PO intake, Weight trends, Labs  ASSESSMENT:   47 yo female admitted  3/18 with worsening dyspnea/pleural effusion, acute respiratory failure with hypoxia, s/p thoracenteses on 3/7 and 12/26/17,  HTN. History significant for renal cell carcinoma, breast cancer with bone/liver mets (Radiation ended in February, currently on immunotherapy). PMH DVT, anemia  Weight +3 lbs/1.2 kg compared to admission weight. Pt seen for initial assessment during which time she was sleeping and all information was obtained from her husband at bedside. He had requested pt not be aroused in order to perform NFPE.   Pt in bed eating lunch at the time of RD arrival. Per chart review, she consumed 90% of breakfast this AM (278 kcal, 12 grams of protein). She reports that she has had a good appetite throughout the weekend and today. [name] is ordered but pt states she prefers not to drink it when she is eating well and simply uses it as a supplement when intakes are poor/appetite is poor. Pt appreciative of offer to change order to PRN. She denies any abdominal pain or nausea. She reports having normal BMs without needing to use Colace. She is now s/p Pleurex placement.    Medications reviewed; 100 mg Colace BID, daily multivitamin with minerals, 40 mg oral Protonix/day, 40 mg Deltasone/day. Labs reviewed; Ca: 8.7 mg/dL.     NUTRITION - FOCUSED PHYSICAL EXAM:  Completed/assessed today with no noted muscle or fat wasting. Moderate edema noted to RUE.  Diet Order:  Diet regular Room service appropriate?  Yes; Fluid consistency: Thin  EDUCATION NEEDS:   No education needs have been identified at this time  Skin:  Skin Assessment: Reviewed RN Assessment  Last BM:  3/25  Height:   Ht Readings from Last 1 Encounters:  12/29/17 _0  (1.651 m)    Weight:   Wt Readings from Last 1 Encounters:  01/02/18 144 lb 10 oz (65.6 kg)    Ideal Body Weight:  56.8 kg  BMI:  Body mass index is 24.07 kg/m.  Estimated Nutritional Needs:   Kcal:  2125-2254 (33-35 kcal/kg)  Protein:  96-106 grams (average 1.5 g/kg and 20% kcal)  Fluid:  >=2.1 L/day      Phyllis Matin, MS, RD, LDN, [facility] Inpatient Clinical Dietitian Pager # [phone] After hours/weekend pager # [phone]

## 2018-01-06 ENCOUNTER — Ambulatory Visit: Payer: 59 | Admitting: Hematology and Oncology

## 2018-01-06 ENCOUNTER — Other Ambulatory Visit: Payer: 59

## 2018-01-06 DIAGNOSIS — J189 Pneumonia, unspecified organism: Secondary | ICD-10-CM

## 2018-01-06 DIAGNOSIS — Z171 Estrogen receptor negative status [ER-]: Secondary | ICD-10-CM

## 2018-01-06 DIAGNOSIS — C50411 Malignant neoplasm of upper-outer quadrant of right female breast: Principal | ICD-10-CM

## 2018-01-06 LAB — BASIC METABOLIC PANEL
ANION GAP: 8 (ref 5–15)
BUN: 12 mg/dL (ref 6–20)
CHLORIDE: 103 mmol/L (ref 101–111)
CO2: 29 mmol/L (ref 22–32)
Calcium: 8.7 mg/dL — ABNORMAL LOW (ref 8.9–10.3)
Creatinine, Ser: 0.49 mg/dL (ref 0.44–1.00)
GFR calc non Af Amer: >60 mL/min (ref >60)
GLUCOSE: 90 mg/dL (ref 65–99)
POTASSIUM: 3.5 mmol/L (ref 3.5–5.1)
Sodium: 140 mmol/L (ref 135–145)

## 2018-01-06 LAB — CBC
HEMATOCRIT: 24.2 % — AB (ref 36.0–46.0)
HEMOGLOBIN: 7.8 g/dL — AB (ref 12.0–15.0)
MCH: 26.4 pg (ref 26.0–34.0)
MCHC: 32.2 g/dL (ref 30.0–36.0)
MCV: 82 fL (ref 78.0–100.0)
Platelets: 255 10*3/uL (ref 150–400)
RBC: 2.95 MIL/uL — AB (ref 3.87–5.11)
RDW: 19.5 % — AB (ref 11.5–15.5)
WBC: 7.7 10*3/uL (ref 4.0–10.5)

## 2018-01-06 MED ORDER — POTASSIUM CHLORIDE CRYS ER 20 MEQ PO TBCR
40.0000 meq | EXTENDED_RELEASE_TABLET | Freq: Three times a day (TID) | ORAL | Status: AC
  Administered 2018-01-06 (×2): 40 meq via ORAL
  Filled 2018-01-06 (×2): qty 2

## 2018-01-06 NOTE — Progress Notes (Signed)
[facility] PROGRESS NOTE  Phyllis Pruitt MPN:[id number] DOB: [birth year] DOA: 12/29/2017  PCP: [name], MD  Brief History/Interval Summary: Patient is a 47 year old African-American female with past medical history of stage IV breast cancer with recurrent left-sided pleural effusion .She was admitted for worsening shortness of breath.  She was found to have acute hypoxemic respiratory failure on presentation.  Patient is status post thoracocentesis and underwent left-sided Pleurx catheter placement by IR.  Pulmonology continues to follow.  Reason for Visit: Left pleural effusion  Consultants: Pulmonology.  Interventional radiology.  Procedures:  Thoracentesis.  Pleurx catheter placement.  Transthoracic echocardiogram Study Conclusions  - Left ventricle: The cavity size was normal. Wall thickness was   normal. Systolic function was normal. The estimated ejection   fraction was in the range of 55% to 60%. Wall motion was normal;   there were no regional wall motion abnormalities. Due to   tachycardia, there was fusion of early and atrial contributions   to ventricular filling. The study is not technically sufficient   to allow evaluation of LV diastolic function. - Pericardium, extracardiac: A small pericardial effusion was   identified circumferential to the heart. There was a large and   complex left pleural effusion, with multiple thick septations and   loculation..  Impressions:  - Difficult study due to extreme tachycardia and poor echo windows.   There is at most equivocal evidence of increased ventricular   interdependence. Doubt tamponade physiology, but cannot exclude a   component of constriction.   The dominant pathology appears to be the large and complex left   pleural effusion.  RUE Venous Doppler There is what appears to be a chronic DVT of the subclavian vein and an age indeterminate DVT of the axillary, and brachial veins. There appears to  be an age indeterminate superficial thrombus of the upper arm basilic vein   Antibiotics: Vancomycin and cefepime Vancomycin was discontinued on 3/23  Subjective/Interval History: She states that she is feeling better.  Her breathing has improved.  Denies any chest pain.  Occasional cough.  .  ROS: Denies any nausea or vomiting.  Objective:  Vital Signs  Vitals:   01/06/18 0000 01/06/18 0321 01/06/18 0400 01/06/18 0800  BP: 125/77  116/74   Pulse: (!) 113  (!) 105   Resp: 15  18   Temp:  98.5 F (36.9 C)  98.5 F (36.9 C)  TempSrc:  Axillary  Oral  SpO2: 100%  100%   Weight:      Height:        Intake/Output Summary (Last 24 hours) at 01/06/2018 1109 Last data filed at 01/06/2018 0800 Gross per 24 hour  Intake 440 ml  Output 950 ml  Net -510 ml   Filed Weights   12/29/17 0500 01/01/18 0411 01/02/18 0432  Weight: 64.4 kg (141 lb 15.6 oz) 63.6 kg (140 lb 3.4 oz) 65.6 kg (144 lb 10 oz)    General appearance: Awake alert.  In no distress. Resp: Diminished air entry at the bases bilaterally but more so on the left side.  Few crackles.  No wheezing.  Normal effort at rest. Cardio: S1-S2 is normal regular.  No S3-S4.  No rubs murmurs or bruit GI: Abdomen soft.  Nontender nondistended.   Extremities: Mild edema right upper extremity.  Good radial pulses. Neurologic: Old weakness of the right arm.  No other deficits otherwise.  Lab Results:  Data Reviewed: I have personally reviewed following labs and imaging studies  CBC: Recent Labs  Lab 12/31/17 0312 01/02/18 0409 01/04/18 0500 01/06/18 0500  WBC 7.5 11.2* 7.2 7.7  HGB 8.5* 8.1* 8.1* 7.8*  HCT 27.0* 25.8* 24.7* 24.2*  MCV 84.1 83.5 82.9 82.0  PLT 264 311 276 785    Basic Metabolic Panel: Recent Labs  Lab 12/31/17 0312 01/01/18 0355 01/02/18 0409 01/04/18 0500 01/06/18 0500  NA 141 139 142 139 140  K 3.4* 4.4 5.0 3.6 3.5  CL 105 101 105 102 103  CO2 27 29 29 29 29   GLUCOSE 99 172* 150* 83 90  BUN  7 12 16 13 12   CREATININE 0.50 0.56 0.55 0.55 0.49  CALCIUM 8.2* 9.0 9.0 8.7* 8.7*    GFR: Estimated Creatinine Clearance: 77.4 mL/min (by C-G formula based on SCr of 0.49 mg/dL).    Recent Results (from the past 240 hour(s))  MRSA PCR Screening     Status: None   Collection Time: 12/29/17  5:01 AM  Result Value Ref Range Status   MRSA by PCR NEGATIVE NEGATIVE Final    Comment:        The GeneXpert MRSA Assay (FDA approved for NASAL specimens only), is one component of a comprehensive MRSA colonization surveillance program. It is not intended to diagnose MRSA infection nor to guide or monitor treatment for MRSA infections. Performed at [facility], [place] [address], [place], [place] [address]   Body fluid culture     Status: None   Collection Time: 12/31/17  2:00 PM  Result Value Ref Range Status   Specimen Description   Final    PLEURAL Performed at [facility], [place] [address], [place], [place] [address]    Special Requests   Final    NONE Performed at [facility], [place] [address], [place], [place] [address]    Gram Stain   Final    MODERATE WBC PRESENT, PREDOMINANTLY MONONUCLEAR NO ORGANISMS SEEN    Culture   Final    NO GROWTH 3 DAYS Performed at [place] [address], [place], [place] [address]    Report Status 01/04/2018 FINAL  Final      Radiology Studies: Dg Chest Port 1 View  Result Date: 01/05/2018 CLINICAL DATA:  Pleural effusion. EXAM: PORTABLE CHEST 1 VIEW COMPARISON:  Radiograph of January 04, 2018. FINDINGS: Stable cardiomediastinal silhouette. Left subclavian Port-A-Cath is unchanged in position. Stable minimal left apical pneumothorax is noted. Stable moderate left pleural effusion is noted. Right-sided chest tube is unchanged in position. Stable right upper lobe opacity is noted concerning for pneumonia. Right axillary surgical clips are noted. Bony thorax is unremarkable.  IMPRESSION: Stable left hydropneumothorax. Stable position of left-sided chest tube. Stable right upper lobe airspace opacity is noted. Electronically Signed   By: Marijo Conception, M.D.   On: 01/05/2018 07:09     Medications:  Scheduled: . amLODipine  5 mg Oral Daily  . apixaban  5 mg Oral BID  . chlorhexidine  15 mL Mouth Rinse BID  . Chlorhexidine Gluconate Cloth  6 each Topical Daily  . docusate sodium  100 mg Oral BID  . gabapentin  300 mg Oral BID  . mouth rinse  15 mL Mouth Rinse q12n4p  . multivitamin with minerals  1 tablet Oral Daily  . pantoprazole  40 mg Oral Daily  . potassium chloride  40 mEq Oral TID  . predniSONE  40 mg Oral Q breakfast   Continuous: . ceFEPime (MAXIPIME) IV  2 g (01/06/18 1033)   BSJ:[id number] **OR** acetaminophen, ALPRAZolam, feeding supplement (ENSURE ENLIVE), levalbuterol, lip balm, morphine injection, nitroGLYCERIN, ondansetron **OR** ondansetron (ZOFRAN) IV, oxyCODONE, prochlorperazine, sodium chloride, sodium chloride flush, traZODone  Assessment/Plan:  Principal Problem:   Acute respiratory failure with hypoxia (HCC) Active Problems:   Breast cancer of upper-outer quadrant of right female breast (HCC)   Pleural effusion on left   Tachycardia   Loculated pleural effusion   Radiation pneumonitis (HCC)    Acute respiratory failure with hypoxia Secondary to pleural effusion, pneumonia and hydropneumothorax.  Patient remains stable.  Oxygenating well with supplemental oxygen.  She was taken off of oxygen this morning and saturating in the mid 90s on room air at rest.  We will need to check saturations with ambulation.  She did require BiPAP in the early part of this admission but has been off of it since.  Pulmonology continues to follow.    Left-sided complex/loculated malignant pleural effusion She is status post Pleurx catheter placement.  Patient continues to have hydropneumothorax.  She has trapped lung which is the reason for the  appearance on imaging studies.  Patient not a surgical candidate per pulmonology.  Pleurx catheter has been drained daily for the last 4 days.  Patient was initially started on vancomycin and cefepime.  She was taken off of vancomycin 3 days ago.  Remains on cefepime and last dose to be administered tomorrow.  No positive cultures noted.  Her metastatic cancer is also contributing.  Further management per pulmonology.  Their plan is to complete treatment with cefepime tomorrow and then discharge home with Pleurx catheter.  Patient will need home health.  Radiation pneumonitis Started on steroids by pulmonology which is being continued.  Currently on prednisone.  This will need to be tapered slowly per pulmonology.  Sinus tachycardia Most likely due to respiratory issues.  Echocardiogram showed normal systolic function.  TSH was normal recently.  Heart rate has improved from the 140s that was seen earlier.  Still in the 100-120 range.  Continue to monitor.  History of DVT DVT was in the right upper extremity.  This was diagnosed at [facility] [place] in [place].  She mentioned that her right arm seemed more swollen.  She had been on Xarelto previously.  Switched over to Eliquis per patient preference.  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  Will not to recommend any changes to treatment at this time.  Keep arm elevated as much as possible.     Right breast cancer Followed by Dr. [name].  Has been getting immunotherapy.  Based on his last note her disease appears to be getting worse which indicates poor prognosis.  Patient however wants to pursue all options for now.  Discussed with Dr. [name] yesterday who will come by and speak to the patient regarding her disease and prognosis.  Essential hypertension Continue to monitor blood pressures.  Noted to be on amlodipine which will be continued.  Anemia of chronic disease Hemoglobin has been  stable.  No evidence for overt bleeding.  Continue to check periodically.  Anxiety  Xanax as needed.  DVT Prophylaxis: On Eliquis    Code Status: Full code Family Communication: Discussed with the patient Disposition Plan: Management as outlined above.  To receive cefepime until tomorrow and then potentially discharge afterwards.  Pulmonology to direct care regarding her Pleurx catheter.  Patient will likely need home health.  Pulmonology to also recommend steroid taper regimen.  LOS: 8 days   [facility] Pager [phone] 01/06/2018, 11:09 AM  If 7PM-7AM, please contact night-coverage at [email or web address], password [facility]

## 2018-01-06 NOTE — Progress Notes (Addendum)
..  Name: Phyllis Pruitt MRN: [id number] DOB: 1971/02/03    ADMISSION DATE:  12/29/2017 CONSULTATION DATE:  12/29/2017  REFERRING MD :  Dr. Christy Gentles, ER  CHIEF COMPLAINT:  Shortness of Breath  BRIEF PATIENT DESCRIPTION:  47 yo female with Stage IV breast cancer and recurrent Lt malignant effusion developed hypoxia from progressive effusion.  She has been on pembrolizumab and radiation for breast cancer.  Repeat pleural fluid analysis concerning for infection.  Repeat CT chest showed trapped lung on Lt and infiltrates on Rt concerning for pneumonia versus inflammatory process (?pneumonitis from pembrolizumab).  Also concern for radiation pneumonitis in Rt lung.  PMHx of HTN, DVT on xarelto.  SUBJECTIVE:   Took herself off oxygen and feels better this AM but with ambulation dropped to mid 70's.  VITAL SIGNS: BP 116/74 (BP Location: Left Arm)   Pulse (!) 105   Temp 98.5 F (36.9 C) (Oral)   Resp 18   Ht 5\' 5"  (1.651 m)   Wt 144 lb 10 oz (65.6 kg)   SpO2 100%   BMI 24.07 kg/m   INTAKE/OUTPUT: I/O last 3 completed shifts: In: 1290 [P.O.:790; IV Piggyback:500] Out: 9381 [Urine:650; Chest Tube:1175]  PHYSICAL EXAMINATION:  General - Chronically ill appearing female, NAD Eyes - PERRL, EOM-I and MMM ENT - no sinus tenderness, no oral exudate, no LAN Cardiac - RRR, Nl S1/S2 and -M/R/G. Chest - Decrease BS on the left Abd - Soft, NT, ND and +BS Ext - -edema and -tenderness Skin - Intact Neuro - Alert and interactive Psych - normal mood  LABS: CBC Recent Labs    01/04/18 0500 01/06/18 0500  WBC 7.2 7.7  HGB 8.1* 7.8*  HCT 24.7* 24.2*  PLT 276 255   BMET Recent Labs    01/04/18 0500 01/06/18 0500  NA 139 140  K 3.6 3.5  CL 102 103  CO2 29 29  BUN 13 12  CREATININE 0.55 0.49  GLUCOSE 83 90   Electrolytes Recent Labs    01/04/18 0500 01/06/18 0500  CALCIUM 8.7* 8.7*   Imaging Dg Chest Port 1 View  Result Date: 01/05/2018 CLINICAL DATA:  Pleural  effusion. EXAM: PORTABLE CHEST 1 VIEW COMPARISON:  Radiograph of January 04, 2018. FINDINGS: Stable cardiomediastinal silhouette. Left subclavian Port-A-Cath is unchanged in position. Stable minimal left apical pneumothorax is noted. Stable moderate left pleural effusion is noted. Right-sided chest tube is unchanged in position. Stable right upper lobe opacity is noted concerning for pneumonia. Right axillary surgical clips are noted. Bony thorax is unremarkable. IMPRESSION: Stable left hydropneumothorax. Stable position of left-sided chest tube. Stable right upper lobe airspace opacity is noted. Electronically Signed   By: Marijo Conception, M.D.   On: 01/05/2018 07:09   SIGNIFICANT EVENTS  3/18  Admit with SOB, pleurx by IR 3/20  Instilled tPA and DNAse through pleurx, start steroids 3/21  Instilled tPA and DNAse through pleurx, add ABx  STUDIES ECHO 3/19 >> EF 55 to 60% Lt pleural fluid 3/20 >> glucose 30, WBC 791 (68% neutrophils) CT chest 3/22 >> mod Lt effusion, 35% Lt PTX, RUL and superior segment RLL GGO, metastatic deposits in liver, lytic/sclerotic bone lesions  CULTURES: Lt pleural fluid 3/20 >>NTD  ANTIBIOTICS: Vancomycin 3/20 >> 3/23 Cefepime 3/20 >>   I reviewed CXR myself, hydropneumothorax noted.  DISCUSSION: She has combination of Lt malignant pleural effusion likely complicated by empyema, Rt lung pneumonia versus pembrolizumab pneumonitis, and Rt lung radiation pneumonitis.  Pleural fluid drainage improved after  tPA/DNAase therapy.  Not a surgical candidate at this time for VATS decortication of Lt lung.  Has improvement in respiratory status since the addition of ABx and steroids.  ASSESSMENT / PLAN:  Acute hypoxic respiratory failure. - Titrate O2 for sat of 88-92% - IS and flutter valve - Ambulate - PT - Ok off O2 at rest and place on O2 with activity, needs ambulatory desat to evaluate activity O2 level need.  Patient has a concentrator at home, may need home health  to educate her on how to titrate pending kind of concentrator she has.  Lt pleural effusion from malignancy complicated by empyema. HCAP Rt lung. - S/p pleurx by IR - Drain at least every other day (patient informed) or if symptomatic - Ordered a pack of pleurex drains to take home and home health will provide more once at home - F/u CXR if clinical status changes - Continue cefepime day 7/8, tomorrow will be the last day - Defer tPA/DNase injection more at this time, draining 400 ml/day per patient  Acute lung injury (HCAP versus immunotherapy induced pneumonitis). Rt lung radiation pneumonitis. - Continue prednisone with slow taper - Cefepime as above  Stage IV breast cancer. Hx of Lt renal cell carcinoma. - Most recently on pembrolizumab  - Will need oncology input on treatment options and prognosis, especially if pembrolizumab caused pneumonitis, will defer to primary to call H/O.  Hx of DVT. - Eliquis  Sinus tachycardia. - Tele monitoring - Continue beta blockers if continues and becomes hypertensive  Anxiety related to dyspnea. - Prn xanax, morphine  Ok to go home tomorrow with plan as above.  Discussed with PCCM-NP  Rush Farmer, M.D. [facility] Medicine. Pager: [phone]. After hours pager: [phone].  01/06/2018, 8:58 AM

## 2018-01-06 NOTE — Evaluation (Addendum)
Physical Therapy Evaluation Patient Details Name: Phyllis Pruitt MRN: [id number] DOB: October 23, 1970 Today's Date: 01/06/2018   History of Present Illness  Patient is a 46 year old African-American female with past medical history of stage IV breast cancer with recurrent left-sided pleural effusion .She was admitted for worsening shortness of breath.  She was found to have acute hypoxemic respiratory failure on presentation.  Patient is status post thoracocentesis and underwent left-sided Pleurx catheter placement by IR  Clinical Impression  The patient is very motivated to participate in Pruitt. HR  Up to 160, Saturation on RA > 93%. Pruitt admitted with above diagnosis. Pruitt currently with functional limitations due to the deficits listed below (see Pruitt Problem List).  Pruitt will benefit from skilled Pruitt to increase their independence and safety with mobility to allow discharge to the venue listed below.       Follow Up Recommendations Home health Pruitt    Equipment Recommendations  3in1 (Pruitt);Wheelchair (measurements Pruitt);Wheelchair cushion (measurements Pruitt)    Patient suffers from recurrent pleural effusion  And RUE paresis which impairs their ability to perform daily activities like ambulating  in the home.  A walker alone will not resolve the issues with performing activities of daily living. A wheelchair will allow patient to safely perform daily activities.  The patient can self propel in the home or has a caregiver who can provide assistance.          Recommendations for Other Services       Precautions / Restrictions Precautions Precautions: Fall Precaution Comments: monitor sats, and HR-goes 150's, RUE plegia      Mobility  Bed Mobility Overal bed mobility: Independent                Transfers Overall transfer level: Needs assistance Equipment used: None Transfers: Sit to/from Stand Sit to Stand: Min guard            Ambulation/Gait Ambulation/Gait assistance: Min  assist Ambulation Distance (Feet): 180 Feet(x 2) Assistive device: 1 person hand held assist Gait Pattern/deviations: Step-through pattern;Drifts right/left     General Gait Details: occassional steady assist, stopped x 3 for rest break satt down after 180'.  HR 160, sats on RA >935, RR hi 30's  Stairs            Wheelchair Mobility    Modified Rankin (Stroke Patients Only)       Balance Overall balance assessment: Needs assistance Sitting-balance support: No upper extremity supported;Feet supported Sitting balance-Leahy Scale: Good     Standing balance support: No upper extremity supported;During functional activity Standing balance-Leahy Scale: Fair                               Pertinent Vitals/Pain Pain Assessment: No/denies pain    Home Living Family/patient expects to be discharged to:: Private residence Living Arrangements: Spouse/significant other Available Help at Discharge: Family Type of Home: House Home Access: Stairs to enter   Technical brewer of Steps: 2 Home Layout: Two level;Bed/bath upstairs Home Equipment: None      Prior Function Level of Independence: Needs assistance   Gait / Transfers Assistance Needed: independent  ADL's / Homemaking Assistance Needed: assist with shower        Hand Dominance        Extremity/Trunk Assessment   Upper Extremity Assessment Upper Extremity Assessment: RUE deficits/detail RUE Deficits / Details: minimal functional use, flaccid    Lower Extremity Assessment Lower Extremity  Assessment: Generalized weakness       Communication      Cognition Arousal/Alertness: Awake/alert Behavior During Therapy: WFL for tasks assessed/performed Overall Cognitive Status: Within Functional Limits for tasks assessed                                        General Comments      Exercises     Assessment/Plan    Pruitt Assessment Patient needs continued Pruitt services  Pruitt  Problem List Decreased strength;Decreased activity tolerance;Decreased balance;Decreased mobility;Cardiopulmonary status limiting activity       Pruitt Treatment Interventions DME instruction;Gait training;Stair training;Functional mobility training;Therapeutic activities;Patient/family education;Therapeutic exercise    Pruitt Goals (Current goals can be found in the Care Plan section)  Acute Rehab Pruitt Goals Patient Stated Goal: I want to walk, take care of myself Pruitt Goal Formulation: With patient/family Time For Goal Achievement: 01/20/18 Potential to Achieve Goals: Good    Frequency Min 3X/week   Barriers to discharge        Co-evaluation               AM-PAC Pruitt "6 Clicks" Daily Activity  Outcome Measure Difficulty turning over in bed (including adjusting bedclothes, sheets and blankets)?: None Difficulty moving from lying on back to sitting on the side of the bed? : None Difficulty sitting down on and standing up from a chair with arms (e.g., wheelchair, bedside commode, etc,.)?: A Little Help needed moving to and from a bed to chair (including a wheelchair)?: A Little Help needed walking in hospital room?: A Lot Help needed climbing 3-5 steps with a railing? : A Lot 6 Click Score: 18    End of Session   Activity Tolerance: Patient tolerated treatment well Patient left: in bed;with call bell/phone within reach;with family/visitor present Nurse Communication: Mobility status Pruitt Visit Diagnosis: Unsteadiness on feet (R26.81)    Time: 0923-3007 Pruitt Time Calculation (min) (ACUTE ONLY): 40 min   Charges:   Pruitt Evaluation $Pruitt Eval Low Complexity: 1 Low Pruitt Treatments $Gait Training: 8-22 mins $Self Care/Home Management: 8-22   Pruitt G CodesTresa Pruitt Pruitt [phone]   Phyllis Pruitt 01/06/2018, 4:35 PM

## 2018-01-06 NOTE — Care Management Note (Signed)
Case Management Note  Patient Details  Name: ANJELA CASSARA MRN: [id number] Date of Birth: 1971-05-12  Subjective/Objective:                   Action/Plan:   Expected Discharge Date:  01/01/18               Expected Discharge Plan:  [place]  In-House Referral:     Discharge planning Services  CM Consult  Post Acute Care Choice:    Choice offered to:  Patient  DME Arranged:  3-N-1, Wheelchair manual DME Agency:  [facility]  HH Arranged:  RN, PT Axis Agency:  [place]  Status of Service:  In process, will continue to follow  If discussed at Long Length of Stay Meetings, dates discussed:    Additional Comments:  Dessa Phi, RN 01/06/2018, 4:06 PM

## 2018-01-06 NOTE — Progress Notes (Signed)
Patients husband draied  pleurex drain correctly with RN supervision.

## 2018-01-06 NOTE — Progress Notes (Signed)
PT Cancellation Note  Patient Details Name: IVORIE UPLINGER MRN: [id number] DOB: 09-05-1971   Cancelled Treatment:    Reason Eval/Treat Not Completed: Other (comment)getting bath, ambulated in room with CNA. Check back later.  Patient requesting 3 in1 bedside commode for home.   Claretha Cooper 01/06/2018, 11:25 AM Tresa Endo PT [phone]

## 2018-01-06 NOTE — Care Management Note (Signed)
Case Management Note  Patient Details  Name: [name] MRN: [id number] Date of Birth: [birth year]  Subjective/Objective: PT recc HHPT. [facility] rep Malachy Mood following HHRN-pleurx cath. 1 box of 10 cannisters already in rm. Forms for MD signature on shadow chart-CM will fax to [place] for further cannisters for home. [facility] rep Santiago Glad aware of 3n1,& w/c needed @ d/c. Spouse able to transport home on own.                  Action/Plan:d/c home w/HHC/pleurx cath drainage cannisters   Expected Discharge Date:  01/01/18               Expected Discharge Plan:  [place]  In-House Referral:     Discharge planning Services  CM Consult  Post Acute Care Choice:    Choice offered to:  Patient  DME Arranged:  3-N-1, Wheelchair manual DME Agency:  [facility])  HH Arranged:  RN, PT HH Agency:  [place]  Status of Service:  In process, will continue to follow  If discussed at Long Length of Stay Meetings, dates discussed:    Additional Comments:  Dessa Phi, RN 01/06/2018, 4:00 PM

## 2018-01-07 ENCOUNTER — Telehealth: Payer: Self-pay

## 2018-01-07 LAB — BASIC METABOLIC PANEL
Anion gap: 10 (ref 5–15)
BUN: 9 mg/dL (ref 6–20)
CHLORIDE: 101 mmol/L (ref 101–111)
CO2: 27 mmol/L (ref 22–32)
CREATININE: 0.5 mg/dL (ref 0.44–1.00)
Calcium: 9.2 mg/dL (ref 8.9–10.3)
GFR calc Af Amer: >60 mL/min (ref >60)
GFR calc non Af Amer: >60 mL/min (ref >60)
GLUCOSE: 85 mg/dL (ref 65–99)
POTASSIUM: 3.9 mmol/L (ref 3.5–5.1)
Sodium: 138 mmol/L (ref 135–145)

## 2018-01-07 LAB — PHOSPHORUS: Phosphorus: 3 mg/dL (ref 2.5–4.6)

## 2018-01-07 LAB — CBC
HEMATOCRIT: 25.9 % — AB (ref 36.0–46.0)
HEMOGLOBIN: 8.3 g/dL — AB (ref 12.0–15.0)
MCH: 26.6 pg (ref 26.0–34.0)
MCHC: 32 g/dL (ref 30.0–36.0)
MCV: 83 fL (ref 78.0–100.0)
Platelets: 317 10*3/uL (ref 150–400)
RBC: 3.12 MIL/uL — AB (ref 3.87–5.11)
RDW: 19.9 % — ABNORMAL HIGH (ref 11.5–15.5)
WBC: 8.8 10*3/uL (ref 4.0–10.5)

## 2018-01-07 LAB — MAGNESIUM: Magnesium: 1.7 mg/dL (ref 1.7–2.4)

## 2018-01-07 MED ORDER — DOCUSATE SODIUM 100 MG PO CAPS: 100.0000 mg | ORAL_CAPSULE | Freq: Two times a day (BID) | ORAL | 0 refills | Status: AC

## 2018-01-07 MED ORDER — ENSURE ENLIVE PO LIQD: 237.0000 mL | Freq: Two times a day (BID) | ORAL | 12 refills | Status: AC | PRN

## 2018-01-07 MED ORDER — APIXABAN 5 MG PO TABS: 5.0000 mg | ORAL_TABLET | Freq: Two times a day (BID) | ORAL | 0 refills | Status: AC

## 2018-01-07 MED ORDER — PREDNISONE 20 MG PO TABS: ORAL_TABLET | ORAL | 0 refills | Status: DC

## 2018-01-07 MED ORDER — HEPARIN SOD (PORK) LOCK FLUSH 100 UNIT/ML IV SOLN
500.0000 [IU] | INTRAVENOUS | Status: AC | PRN
  Administered 2018-01-07: 500 [IU]

## 2018-01-07 MED ORDER — LEVALBUTEROL HCL 0.63 MG/3ML IN NEBU: 0.6300 mg | INHALATION_SOLUTION | Freq: Four times a day (QID) | RESPIRATORY_TRACT | 12 refills | Status: AC | PRN

## 2018-01-07 NOTE — Progress Notes (Signed)
Patient's husband drained pleurex drain correctly with RN supervision. 150 cc drained. Pt tolerated well. Will continue to monitor pt.

## 2018-01-07 NOTE — Care Management Note (Signed)
Case Management Note  Patient Details  Name: Phyllis Pruitt MRN: [id number] Date of Birth: 09-22-1971  Subjective/Objective:Faxed w/confirmation to [facility] drainage kit forms for further pleurx cath drainage kits. All dme delivered by [facility]-02,w/c,3n1. 1 box drainage kits with patient. [facility] for HHRN/HHPT rep Malachy Mood aware of d/c. No further CM needs.                    Action/Plan:d/c home w/HHC/dme   Expected Discharge Date:  01/07/18               Expected Discharge Plan:  [place]  In-House Referral:     Discharge planning Services  CM Consult  Post Acute Care Choice:    Choice offered to:  Patient  DME Arranged:  3-N-1, Wheelchair manual, Oxygen DME Agency:  [facility])  HH Arranged:  RN, PT HH Agency:  [facility]  Status of Service:  Completed, signed off  If discussed at [place] of Stay Meetings, dates discussed:    Additional Comments:  Dessa Phi, RN 01/07/2018, 12:32 PM

## 2018-01-07 NOTE — Progress Notes (Signed)
Discharge instructions given. Pt verbalized understanding and all questions were answered.  

## 2018-01-07 NOTE — Telephone Encounter (Signed)
Pt call to clarify if she is to take her supplements back since being discharged from the hospital. She states that she was to discontinue all supplements at this time. Dr. [name] is okay with patient continuing all supplements at this time.   Pt also requesting to postpone treatment next week to 4/10th. Pt would like to take a break. Discussed with MD and okay to reschedule infusion and MD appt in 2 weeks instead. No further needs at this time.   Sent msg to scheduling to reschedule infusion and md appt time next week to 4/10.

## 2018-01-11 NOTE — Discharge Summary (Signed)
Physician Discharge Summary  Phyllis Pruitt BHA:[id number] DOB: [birth year] DOA: 12/29/2017  PCP: [name], MD  Admit date: 12/29/2017 Discharge date: 01/07/2018  Admitted From: [facility].  Disposition:  Home.   Recommendations for Outpatient Follow-up:  1. Follow up with PCP in 1-2 weeks 2. Please obtain BMP/CBC in one week 3. Please follow up with oncology as scheduled.   Home Health:yes Equipment/Devices:pleurex.   Discharge Condition:stable.  CODE STATUS: full code.  Diet recommendation: Heart Healthy /  Brief/Interim Summary: Patient is a 47 year old African-American female with past medical history of stage IV breast cancer with recurrent left-sided pleural effusion .She was admitted for worsening shortness of breath.  She was found to have acute hypoxemic respiratory failure on presentation.  Patient is status post thoracocentesis and underwent left-sided Pleurx catheter placement by IR. Discharge Diagnoses:  Principal Problem:   Acute respiratory failure with hypoxia (HCC) Active Problems:   Breast cancer of upper-outer quadrant of right female breast (HCC)   Pleural effusion on left   Tachycardia   Loculated pleural effusion   Radiation pneumonitis (HCC)  Acute respiratory failure with hypoxia Secondary to pleural effusion, pneumonia and hydropneumothorax.  Patient remains stable.  Oxygenating well with supplemental oxygen.  She was taken off of oxygen this morning and saturating in the mid 90s on room air at rest. Discharge with oxygen.   Left-sided complex/loculated malignant pleural effusion She is status post Pleurx catheter placement.  Patient continues to have hydropneumothorax.  She has trapped lung which is the reason for the appearance on imaging studies.  Patient not a surgical candidate per pulmonology.  Pleurx catheter has been drained daily for the last 4 days.  Patient was initially started on vancomycin and cefepime.  She was taken off of vancomycin 3 days  ago.  Remains on cefepime and last dose to be administered tomorrow.  No positive cultures noted.  Her metastatic cancer is also contributing. Plan for discharge home with Pleurx catheter.  Patient will need home health.  Radiation pneumonitis Started on steroids by pulmonology which is being continued.  Currently on prednisone.  This will need to be tapered slowly per pulmonology.  Sinus tachycardia Most likely due to respiratory issues.  Echocardiogram showed normal systolic function.  TSH was normal recently. much improved on discharge. 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  This was diagnosed at [facility] [place] in [place].  She mentioned that her right arm seemed more swollen.  She had been on Xarelto previously.  Switched over to Eliquis per patient preference.  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  Will not to recommend any changes to treatment at this time.  Keep arm elevated as much as possible.   continue with eliquis on discharge.   Right breast cancer Followed by Dr. [name].  Has been getting immunotherapy.  Based on his last note her disease appears to be getting worse which indicates poor prognosis.  Patient however wants to pursue all options for now.   Essential hypertension Better controlled.   Anemia of chronic disease Hemoglobin has been stable.  No evidence for overt bleeding.  Continue to check periodically.  Anxiety  Xanax as needed.  Discharge Instructions  Discharge Instructions    Diet - low sodium heart healthy   Complete by:  As directed    Increase activity slowly   Complete by:  As directed      Allergies as of 01/07/2018  Reactions   Sulfa Antibiotics Itching   Vaginal bleeding      Medication List    STOP taking these medications   amLODipine 5 MG tablet Commonly known as:  NORVASC   amoxicillin-clavulanate 875-125 MG tablet Commonly known as:   AUGMENTIN   Black Pepper-Turmeric 3-500 MG Caps   NYQUIL HBP COLD & FLU 15-6.25-325 MG/15ML Liqd Generic drug:  DM-Doxylamine-Acetaminophen   PROBIOTIC PO   rivaroxaban 20 MG Tabs tablet Commonly known as:  XARELTO     TAKE these medications   apixaban 5 MG Tabs tablet Commonly known as:  ELIQUIS Take 1 tablet (5 mg total) by mouth 2 (two) times daily.   docusate sodium 100 MG capsule Commonly known as:  COLACE Take 1 capsule (100 mg total) by mouth 2 (two) times daily.   feeding supplement (ENSURE ENLIVE) Liqd Take 237 mLs by mouth 2 (two) times daily as needed (If pt requests.).   gabapentin 300 MG capsule Commonly known as:  NEURONTIN Take 1 capsule (300 mg total) by mouth 3 (three) times daily. What changed:  when to take this   levalbuterol 0.63 MG/3ML nebulizer solution Commonly known as:  XOPENEX Take 3 mLs (0.63 mg total) by nebulization every 6 (six) hours as needed for wheezing or shortness of breath.   lidocaine-prilocaine cream Commonly known as:  EMLA Apply to affected area once What changed:    how much to take  how to take this  when to take this  reasons to take this  additional instructions   multivitamin tablet Take 1 tablet by mouth daily.   nitroGLYCERIN 0.4 MG SL tablet Commonly known as:  NITROSTAT Place 1 tablet (0.4 mg total) under the tongue every 5 (five) minutes as needed for chest pain (call MD if not better after 2 doses).   oxyCODONE 5 MG immediate release tablet Commonly known as:  Oxy IR/ROXICODONE Take 1-2 tablets (5-10 mg total) by mouth every 4 (four) hours as needed for severe pain.   pantoprazole 40 MG tablet Commonly known as:  PROTONIX Take 1 tablet (40 mg total) by mouth daily.   potassium chloride SA 20 MEQ tablet Commonly known as:  K-DUR,KLOR-CON TAKE 1 TABLET(20 MEQ) BY MOUTH DAILY   predniSONE 20 MG tablet Commonly known as:  DELTASONE Prednisone 40 mg daily for 2 days followed by  Prednisone 30 mg  daily for 3 days followed by  Prednisone 20 mg daily for 3 days followed by  Prednisone 10 mg daily for 3 days.   prochlorperazine 10 MG tablet Commonly known as:  COMPAZINE Take 1 tablet (10 mg total) by mouth every 6 (six) hours as needed for nausea or vomiting.   traZODone 100 MG tablet Commonly known as:  DESYREL Take 1 tablet (100 mg total) by mouth at bedtime as needed for sleep.      Follow-up Information    [name], MD. Schedule an appointment as soon as possible for a visit in 1 week(s).   Specialty:  Family Medicine Contact information: [address] [place] [address] [phone]        [facility] - Dme Follow up.   Why:  w/c,0xygen,bedside commode Contact information: [place] [address] [phone]        Care, [facility] Follow up.   Why:  HH nursing-pleurx cath/HH physical therapy Contact information: [place] [address] [phone]  Allergies  Allergen Reactions  . Sulfa Antibiotics Itching    Vaginal bleeding    Consultations: PCCM.   Procedures/Studies: Dg Chest 1 View  Result Date: 12/26/2017 CLINICAL DATA:  Status post thoracentesis EXAM: CHEST  1 VIEW COMPARISON:  Chest radiograph December 10, 2017 and chest CT December 18, 2017 FINDINGS: No evident pneumothorax. Large loculated effusion remains on the left. There is consolidation throughout portions of the left mid lower lung zones. There is more patchy infiltrate in the right upper lobe. Port-A-Cath tip is at the junction of the left innominate vein and superior vena cava. No pneumothorax. There is cardiomegaly with pulmonary vascularity within normal limits. No adenopathy demonstrable by radiography. IMPRESSION: No pneumothorax following thoracentesis. Sizable loculated pleural effusion on the left remains with consolidation throughout much of the left mid lower lung zones. Patchy  airspace opacity right upper lobe persists. Stable cardiac prominence. Stable positioning of Port-A-Cath. No demonstrable adenopathy. Electronically Signed   By: Lowella Grip III M.D.   On: 12/26/2017 14:53   Ct Chest Wo Contrast  Result Date: 01/02/2018 CLINICAL DATA:  Increasing shortness of breath and patient with a history of metastatic breast carcinoma. EXAM: CT CHEST WITHOUT CONTRAST TECHNIQUE: Multidetector CT imaging of the chest was performed following the standard protocol without IV contrast. COMPARISON:  CT chest 12/18/2017.  Single-view of the chest today. FINDINGS: Cardiovascular: Heart size is normal. No pericardial effusion. No atherosclerosis. Mediastinum/Nodes: Left supraclavicular lymph node measuring 1 cm short axis dimension on image 10 is unchanged. Right internal mammary lymph node on image 46 of series 2 measuring 0.9 cm is unchanged. No new or enlarging lymph nodes are identified. Lungs/Pleura: Moderately large left pleural effusion has increased in size since the prior CT. A new left pleural drainage catheter is in place. The patient also has a left pneumothorax estimated at 35%. Aeration in the left lung is markedly improved with some hazy residual ground-glass opacity identified. Airspace disease in the right chest has markedly worsened with patchy opacities present in all lobes, worst in the right upper lobe and superior segment of the right lower lobe. Upper Abdomen: Multiple metastatic deposits in the liver are again seen but better demonstrated on the prior CT where there is IV contrast. No acute abnormality. Musculoskeletal: All imaged bones demonstrate lytic and sclerotic lesions consistent with diffuse metastatic disease. IMPRESSION: Left pleural drainage catheter is new since the prior chest CT. The patient has a left hydropneumothorax. Moderately large effusion has increased since the prior CT. Pneumothorax component is estimated at 35%. Worsened aeration in the right  chest with most confluent opacity seen in the superior segment of the right lower lobe worrisome for pneumonia or less likely pulmonary hemorrhage. No change in mild mediastinal lymphadenopathy worrisome for metastatic disease. Diffuse osseous metastases and extensive metastatic disease in the liver appear unchanged. Electronically Signed   By: Inge Rise M.D.   On: 01/02/2018 12:42   Ct Angio Chest Pe W Or Wo Contrast  Result Date: 12/18/2017 CLINICAL DATA:  Shortness of breath, history of metastatic breast carcinoma EXAM: CT ANGIOGRAPHY CHEST WITH CONTRAST TECHNIQUE: Multidetector CT imaging of the chest was performed using the standard protocol during bolus administration of intravenous contrast. Multiplanar CT image reconstructions and MIPs were obtained to evaluate the vascular anatomy. CONTRAST:  11mL ISOVUE-370 IOPAMIDOL (ISOVUE-370) INJECTION 76% COMPARISON:  11/04/2017 CT of the chest, 12/10/2017 chest x-ray FINDINGS: Cardiovascular: Thoracic aorta shows a normal branching pattern. No aneurysmal dilatation is noted. No dissection is seen. The  heart is not significantly enlarged. Pulmonary artery demonstrates a normal branching pattern. No filling defects to suggest pulmonary emboli are identified. Mediastinum/Nodes: Thoracic inlet is well visualized and demonstrates a left supraclavicular/cervical lymph node adjacent to the common carotid artery best seen on image number 23 of series 5. This is slightly more prominent than on the prior exam. No significant hilar adenopathy is noted. The right internal mammary node is stable in appearance best seen on image number 86 of series 5. The more inferior lymph node is again identified measuring 1.4 cm in short axis relatively stable from the prior exam. No other significant mediastinal adenopathy is noted. The esophagus is within normal limits. Lungs/Pleura: Post radiation changes are again noted in the right upper lobe is somewhat more consolidated when  compared with the prior exam. Some acute infiltrate is seen along the posterior aspect of the right upper lobe new from the prior exam. The remainder of the right lung is well aerated without focal infiltrate. There are 2 nodular densities along the diaphragm best seen on image number 103 of series 10 and 102 of series 10. The more medial nodule is slightly enlarged measuring 6 mm. The more lateral nodule measures approximately 7 mm increased from approximately for mm on the prior exam. The left lung again demonstrates a pleural effusion which has decreased from the recent chest x-ray dated 12/10/2017 consistent with the recent thoracentesis. No pneumothorax is noted. Considerable alveolar changes are noted throughout the left lung likely related to re-expansion edema. Continued follow-up with chest x-ray is recommended. Upper Abdomen: Visualized upper abdomen again demonstrates multiple hypodense lesions within the liver consistent with metastatic disease. The largest of these centrally measures approximately 3 cm and appears increased from the prior exam a which time it measured 2 cm in greatest dimension. Some fullness of the biliary system in the left lobe is again identified likely related to central obstruction stable from the prior study. The underlying central mass is not as well appreciated on today's exam. No other focal abnormality is noted in the upper abdomen. Musculoskeletal: Scattered areas of lucency are identified throughout the thoracic spine similar to that seen on prior exams consistent with metastatic disease. The overall appearance is stable. Persistent sternal metastatic disease is noted as well. The metastatic disease in the scapula on the left is stable. Right breast implant is noted. Soft tissue mass in the right subpectoral region is seen surrounding previous surgical clips and extending towards the chest wall. It would be difficult to exclude some chest wall invasion on the basis of this  exam. The soft tissue lesion lateral to the breast implant is again identified but incompletely evaluated due to the timing of the contrast bolus. It again measures approximately 19 mm. Left chest wall port is noted. Previously seen small axillary nodes are again identified and stable. Review of the MIP images confirms the above findings. IMPRESSION: No evidence of pneumothorax following left thoracentesis. There is however a considerable amount of parenchymal edema identified likely related to re-expansion edema. Continued follow-up with chest x-ray is recommended. No evidence of pulmonary embolism. Changes consistent with the known history of metastatic breast carcinoma with persistent subpectoral and Peri-implant lesions as well as right internal mammary adenopathy similar to that seen on the prior exam. The known hepatic metastatic disease has increased in size when compare with the prior exam. Persistent left biliary ductal dilatation is noted consistent with central obstructing lesion. Diffuse osseous metastatic disease is again seen and stable from  the prior study. Post radiation changes in the right upper lobe although some new infiltrative changes are noted in the right upper lobe which may represent a super imposed pneumonic infiltrate. These results will be called to the ordering clinician or representative by the Radiologist Assistant, and communication documented in the PACS or zVision Dashboard. Electronically Signed   By: Inez Catalina M.D.   On: 12/18/2017 12:59   Ir Guided Niel Hummer W Catheter Placement  Result Date: 12/29/2017 INDICATION: 47 year old with acute respiratory failure with hypoxia and malignant left pleural effusion. History of breast cancer. EXAM: PLACEMENT OF TUNNELED LEFT PLEURAL CATHETER WITH ULTRASOUND AND FLUOROSCOPIC GUIDANCE MEDICATIONS: Ancef 2 g ANESTHESIA/SEDATION: Fentanyl 150 mcg IV; Versed 3.0 mg IV Moderate Sedation Time:  34 minutes The patient was continuously monitored  during the procedure by the interventional radiology nurse under my direct supervision. FLUOROSCOPY TIME:  18 seconds, 11 mGy COMPLICATIONS: None immediate. PROCEDURE: Informed written consent was obtained from the patient after a thorough discussion of the procedural risks, benefits and alternatives. All questions were addressed. Maximal Sterile Barrier Technique was utilized including caps, mask, sterile gowns, sterile gloves, sterile drape, hand hygiene and skin antiseptic. A timeout was performed prior to the initiation of the procedure. Procedure was technically difficult due to patient's respiratory distress. Wedge was placed underneath the patient to keep her upright and she was rolled onto her right side. The left mid axillary region and posterior left chest were prepped and draped in sterile fashion. Ultrasound demonstrated a large loculated pleural effusion. Skin was anesthetized along the posterior mid axillary line. Small incision was made. Yueh catheter was directed in the pleural space while aspirating with ultrasound guidance. Bloody fluid was aspirated. An incision was made slightly anterior and caudal to the pleural catheter. PleurX catheter was tunneled between the incisions. The cuff was placed underneath the skin. Amplatz wire was advanced through the pleural catheter into the pleural space. Tract was dilated to accommodate a peel-away sheath. PleurX catheter was advanced through the peel-away sheath into the pleural space. Catheter was attached to vacuum bottle. Approximately 800 mL of bloody fluid was removed. Fluid removal was stopped due to patient coughing. Catheter was sutured to skin with Prolene suture. The pleural entrance site was closed using absorbable suture and Dermabond. Fluoroscopic and ultrasound images were taken and saved for documentation. FINDINGS: Large loculated left pleural effusion. Pleural catheter coiled in the left lower chest. 800 mL of bloody fluid was removed.  IMPRESSION: Placement of a tunneled left pleural catheter with ultrasound and fluoroscopic guidance. Electronically Signed   By: Markus Daft M.D.   On: 12/29/2017 17:28   Dg Chest Port 1 View  Result Date: 01/05/2018 CLINICAL DATA:  Pleural effusion. EXAM: PORTABLE CHEST 1 VIEW COMPARISON:  Radiograph of January 04, 2018. FINDINGS: Stable cardiomediastinal silhouette. Left subclavian Port-A-Cath is unchanged in position. Stable minimal left apical pneumothorax is noted. Stable moderate left pleural effusion is noted. Right-sided chest tube is unchanged in position. Stable right upper lobe opacity is noted concerning for pneumonia. Right axillary surgical clips are noted. Bony thorax is unremarkable. IMPRESSION: Stable left hydropneumothorax. Stable position of left-sided chest tube. Stable right upper lobe airspace opacity is noted. Electronically Signed   By: Marijo Conception, M.D.   On: 01/05/2018 07:09   Dg Chest Port 1 View  Result Date: 01/04/2018 CLINICAL DATA:  Follow-up left pleural effusion. History of breast cancer and sickle cell disease. EXAM: PORTABLE CHEST 1 VIEW COMPARISON:  01/03/2018. FINDINGS: Approximately 40%  left hydropneumothorax is demonstrated with a smaller air component. There has been no significant change in airspace opacity in the right mid upper lung zone and left lower lung zone. Increased loculated pleural fluid superiorly on the right. Borderline enlarged cardiac silhouette. Thoracic spine degenerative changes. Right axillary surgical clips. A left basilar chest tube remains in place. Left subclavian porta catheter tip in the proximal superior vena cava. IMPRESSION: 1. Mild decrease in size of the left hydropneumothorax with a smaller air component. 2. No significant change in overall amount of right lung pneumonia with mildly increased associated pleural fluid. 3. Stable left basilar atelectasis. Electronically Signed   By: Claudie Revering M.D.   On: 01/04/2018 07:14   Dg Chest  Port 1 View  Result Date: 01/03/2018 CLINICAL DATA:  Acute respiratory failure with hypoxia. Pleural effusion. EXAM: PORTABLE CHEST 1 VIEW COMPARISON:  Portable chest and chest CT obtained yesterday. FINDINGS: The left basilar PleurX catheter is unchanged. The previously demonstrated approximately 35% left hydropneumothorax is currently estimated at 50% with a greater air component. Increased patchy opacity in the right perihilar region and right upper lobe. Poorly visualized heart borders. Right axillary surgical clips are again demonstrated. Unremarkable bones. IMPRESSION: 1. Approximately 50% left hydropneumothorax with an increased air component. 2. Increased right lung pneumonia. These results will be called to the ordering clinician or representative by the Radiologist Assistant, and communication documented in the PACS or zVision Dashboard. Electronically Signed   By: Claudie Revering M.D.   On: 01/03/2018 07:10   Dg Chest Port 1 View  Result Date: 01/02/2018 CLINICAL DATA:  Follow-up pleural effusion EXAM: PORTABLE CHEST 1 VIEW COMPARISON:  01/01/2018 FINDINGS: Left chest wall port is again seen. Cardiac shadow is stable but partially obscured. Large left pleural effusion is again seen and stable. PleurX catheter is again noted in the left base. No pneumothorax is seen. Patchy airspace opacities are again identified bilaterally and stable. Small right pleural effusion is seen. No bony abnormality is noted. IMPRESSION: Stable bilateral pleural effusions left greater than right. Stable PleurX catheter. Stable bilateral airspace opacities. Electronically Signed   By: Inez Catalina M.D.   On: 01/02/2018 06:55   Dg Chest Port 1 View  Result Date: 01/01/2018 CLINICAL DATA:  Follow-up left pleural effusion. EXAM: PORTABLE CHEST 1 VIEW COMPARISON:  Yesterday. FINDINGS: Moderate to large left pleural effusion with a mild decrease in size. Small right pleural effusion, mildly increased. Decreased bilateral  airspace opacity. Mild thoracic spine degenerative changes and mild thoracolumbar scoliosis. The heart borders are obscured. IMPRESSION: 1. Mildly decreased size of a moderate to large-sized left pleural effusion. 2. Mild increase in size of a small right pleural effusion. 3. Mildly improved bilateral pneumonia or alveolar edema. Electronically Signed   By: Claudie Revering M.D.   On: 01/01/2018 10:49   Dg Chest Port 1 View  Result Date: 12/31/2017 CLINICAL DATA:  Follow-up left pleural effusion. EXAM: PORTABLE CHEST 1 VIEW COMPARISON:  Portable chest x-ray of December 30, 2017 FINDINGS: The lungs are less well inflated today. There is persistent large left pleural effusion. The small caliber left basilar chest tube appears to be in stable position. On the right there is hazy increased density accentuated by patient rotation. I cannot exclude a small right pleural effusion. There is stable right apical pleural thickening. Cardiac silhouette is obscured. The pulmonary vascularity is indistinct. The left porta catheter appears to be in stable position. IMPRESSION: Allowing for differences in positioning and the hypoinflation there has not  been dramatic interval change in the appearance of the chest since yesterday's study. Electronically Signed   By: David  Martinique M.D.   On: 12/31/2017 08:38   Dg Chest Port 1 View  Result Date: 12/30/2017 CLINICAL DATA:  Pleural effusion. History of renal cancer. History of breast cancer. Surgical clips noted over the right chest. No acute bony abnormality. EXAM: PORTABLE CHEST 1 VIEW COMPARISON:  12/29/2017. FINDINGS: PowerPort catheter noted with tip over the superior vena cava in stable position. Left base pleural catheter in stable position. Heart size normal. Persistent but improved large left pleural effusion. Persistent but improved right lung infiltrate with right apical pleural thickening. IMPRESSION: 1. PowerPort catheter and left base pleural catheter in stable position.  No pneumothorax. 2.  Persistent but improved large left pleural effusion. 3. Persistent right lung infiltrates with persistent right apical pleural thickening. No interim change. Electronically Signed   By: Marcello Moores  Register   On: 12/30/2017 07:00   Dg Chest Port 1 View  Result Date: 12/29/2017 CLINICAL DATA:  Status post left pleural catheter placement. EXAM: PORTABLE CHEST 1 VIEW COMPARISON:  12/29/2017 FINDINGS: Left subclavian Port-A-Cath terminates over the upper SVC. A small caliber pleural catheter has been placed and projects over the left lung base. Surgical clips are present in the right axilla. The cardiomediastinal silhouette is grossly unchanged allowing for obscuration of the left heart border. There is a persistent large, partially loculated left pleural effusion, at most slightly smaller than on the prior study. Consolidation and/or atelectasis remains throughout much of the left lung. Interstitial and patchy airspace opacities in the right lung is not significantly changed. No pneumothorax is identified. IMPRESSION: 1. Interval left pleural catheter placement.  No pneumothorax. 2. Similar appearance of large, partially loculated left pleural effusion. 3. Unchanged interstitial and patchy airspace opacities in the right lung, possibly edema. Electronically Signed   By: Logan Bores M.D.   On: 12/29/2017 17:08   Dg Chest Port 1 View  Result Date: 12/29/2017 CLINICAL DATA:  Dyspnea EXAM: PORTABLE CHEST 1 VIEW COMPARISON:  12/26/2017 FINDINGS: Larger left-sided loculated pleural effusion. Mild interstitial edema, slightly increased in appearance. Stable cardiac and mediastinal contours allowing for partial obscuration due to adjacent loculated left effusion. Left subclavian port catheter tip terminates at the left innominate SVC juncture, unchanged in appearance. Postop change right breast and axilla. IMPRESSION: Larger loculated left-sided pleural effusion with slight increase in mild  interstitial edema. Electronically Signed   By: Ashley Royalty M.D.   On: 12/29/2017 01:59   US Thoracentesis Asp Pleural Space W/img Guide  Result Date: 12/26/2017 INDICATION: Metastatic breast carcinoma with recurrent malignant left pleural effusion, dyspnea. Request made for therapeutic left thoracentesis. EXAM: ULTRASOUND GUIDED THERAPEUTIC LEFT THORACENTESIS MEDICATIONS: None COMPLICATIONS: None immediate. PROCEDURE: An ultrasound guided thoracentesis was thoroughly discussed with the patient and questions answered. The benefits, risks, alternatives and complications were also discussed. The patient understands and wishes to proceed with the procedure. Written consent was obtained. Ultrasound was performed to localize and mark an adequate pocket of fluid in the left chest. The area was then prepped and draped in the normal sterile fashion. 1% Lidocaine was used for local anesthesia. Under ultrasound guidance a catheter was introduced. Thoracentesis was performed. The catheter was removed and a dressing applied. FINDINGS: A total of approximately 900 cc of bloody fluid was removed. Due to patient coughing and chest discomfort only the above amount of fluid was removed today. IMPRESSION: Successful ultrasound guided therapeutic left thoracentesis yielding 900 cc of  pleural fluid. Follow-up chest x-ray revealed no pneumothorax. Read by: Rowe Robert, PA-C Electronically Signed   By: Lucrezia Europe M.D.   On: 12/26/2017 15:20   US Thoracentesis Asp Pleural Space W/img Guide  Result Date: 12/18/2017 INDICATION: Patient with history of metastatic right breast carcinoma, dyspnea, left pleural effusion. Request made for diagnostic and therapeutic left thoracentesis. EXAM: ULTRASOUND GUIDED DIAGNOSTIC AND THERAPEUTIC LEFT THORACENTESIS MEDICATIONS: None. COMPLICATIONS: None immediate. PROCEDURE: An ultrasound guided thoracentesis was thoroughly discussed with the patient and questions answered. The benefits, risks,  alternatives and complications were also discussed. The patient understands and wishes to proceed with the procedure. Written consent was obtained. Ultrasound was performed to localize and mark an adequate pocket of fluid in the left chest. The area was then prepped and draped in the normal sterile fashion. 1% Lidocaine was used for local anesthesia. Under ultrasound guidance a 6 Fr Safe-T-Centesis catheter was introduced. Thoracentesis was performed. The catheter was removed and a dressing applied. FINDINGS: A total of approximately 1.8 liters of bloody fluid was removed. Samples were sent to the laboratory as requested by the clinical team. Due to persistent patient coughing/chest discomfort only the above amount of fluid was removed today. IMPRESSION: Successful ultrasound guided diagnostic and therapeutic left thoracentesis yielding 1.8 liters of pleural fluid. Read by: Rowe Robert, PA-C Electronically Signed   By: Lucrezia Europe M.D.   On: 12/18/2017 11:40       Subjective: No chest pain or sob.   Discharge Exam: Vitals:   01/07/18 0400 01/07/18 0800  BP: (!) 146/85   Pulse: (!) 117   Resp: 20   Temp:  98.6 F (37 C)  SpO2: 100%    Vitals:   01/07/18 0034 01/07/18 0345 01/07/18 0400 01/07/18 0800  BP:   (!) 146/85   Pulse:   (!) 117   Resp:   20   Temp: 99.2 F (37.3 C) 98.7 F (37.1 C)  98.6 F (37 C)  TempSrc: Oral Oral  Oral  SpO2:   100%   Weight:  64.4 kg (141 lb 15.6 oz)    Height:        General: Pt is alert, awake, not in acute distress Cardiovascular: RRR, S1/S2 +, no rubs, no gallops Respiratory: CTA bilaterally, no wheezing, no rhonchi Abdominal: Soft, NT, ND, bowel sounds + Extremities: no edema, no cyanosis    The results of significant diagnostics from this hospitalization (including imaging, microbiology, ancillary and laboratory) are listed below for reference.     Microbiology: No results found for this or any previous visit (from the past 240  hour(s)).   Labs: BNP (last 3 results) Recent Labs    12/29/17 0105  BNP 55.7   Basic Metabolic Panel: Recent Labs  Lab 01/06/18 0500 01/07/18 0500  NA 140 138  K 3.5 3.9  CL 103 101  CO2 29 27  GLUCOSE 90 85  BUN 12 9  CREATININE 0.49 0.50  CALCIUM 8.7* 9.2  MG  --  1.7  PHOS  --  3.0   Liver Function Tests: No results for input(s): AST, ALT, ALKPHOS, BILITOT, PROT, ALBUMIN in the last 168 hours. No results for input(s): LIPASE, AMYLASE in the last 168 hours. No results for input(s): AMMONIA in the last 168 hours. CBC: Recent Labs  Lab 01/06/18 0500 01/07/18 0500  WBC 7.7 8.8  HGB 7.8* 8.3*  HCT 24.2* 25.9*  MCV 82.0 83.0  PLT 255 317   Cardiac Enzymes: No results for input(s): CKTOTAL,  CKMB, CKMBINDEX, TROPONINI in the last 168 hours. BNP: Invalid input(s): POCBNP CBG: No results for input(s): GLUCAP in the last 168 hours. D-Dimer No results for input(s): DDIMER in the last 72 hours. Hgb A1c No results for input(s): HGBA1C in the last 72 hours. Lipid Profile No results for input(s): CHOL, HDL, LDLCALC, TRIG, CHOLHDL, LDLDIRECT in the last 72 hours. Thyroid function studies No results for input(s): TSH, T4TOTAL, T3FREE, THYROIDAB in the last 72 hours.  Invalid input(s): FREET3 Anemia work up No results for input(s): VITAMINB12, FOLATE, FERRITIN, TIBC, IRON, RETICCTPCT in the last 72 hours. Urinalysis    Component Value Date/Time   COLORURINE YELLOW 12/10/2017 1600   APPEARANCEUR CLEAR 12/10/2017 1600   LABSPEC 1.012 12/10/2017 1600   PHURINE 5.0 12/10/2017 1600   GLUCOSEU NEGATIVE 12/10/2017 1600   HGBUR NEGATIVE 12/10/2017 1600   BILIRUBINUR NEGATIVE 12/10/2017 1600   KETONESUR NEGATIVE 12/10/2017 1600   PROTEINUR 30 (A) 12/10/2017 1600   NITRITE NEGATIVE 12/10/2017 1600   LEUKOCYTESUR NEGATIVE 12/10/2017 1600   Sepsis Labs Invalid input(s): PROCALCITONIN,  WBC,  LACTICIDVEN Microbiology No results found for this or any previous visit  (from the past 240 hour(s)).   Time coordinating discharge: Over 30 minutes  SIGNED:   Hosie Poisson, MD  [facility] 01/11/2018, 8:35 AM Pager   If 7PM-7AM, please contact night-coverage [email or web address] Password [email or web address]

## 2018-01-12 ENCOUNTER — Telehealth: Payer: Self-pay

## 2018-01-12 ENCOUNTER — Inpatient Hospital Stay: Payer: 59 | Attending: Hematology and Oncology | Admitting: Hematology and Oncology

## 2018-01-12 ENCOUNTER — Ambulatory Visit (HOSPITAL_COMMUNITY)
Admission: RE | Admit: 2018-01-12 | Discharge: 2018-01-12 | Disposition: A | Discharge status: Home / Self Care | Payer: 59 | Source: Ambulatory Visit | Attending: Hematology and Oncology | Admitting: Hematology and Oncology

## 2018-01-12 ENCOUNTER — Telehealth: Payer: Self-pay | Admitting: Internal Medicine

## 2018-01-12 VITALS — BP 118/85 | HR 121 | Temp 98.6°F | Resp 18 | Ht 65.0 in | Wt 132.0 lb

## 2018-01-12 DIAGNOSIS — G620 Drug-induced polyneuropathy: Secondary | ICD-10-CM

## 2018-01-12 DIAGNOSIS — C7951 Secondary malignant neoplasm of bone: Secondary | ICD-10-CM | POA: Diagnosis not present

## 2018-01-12 DIAGNOSIS — Z171 Estrogen receptor negative status [ER-]: Secondary | ICD-10-CM | POA: Insufficient documentation

## 2018-01-12 DIAGNOSIS — J90 Pleural effusion, not elsewhere classified: Secondary | ICD-10-CM | POA: Diagnosis not present

## 2018-01-12 DIAGNOSIS — Z9981 Dependence on supplemental oxygen: Secondary | ICD-10-CM | POA: Insufficient documentation

## 2018-01-12 DIAGNOSIS — R918 Other nonspecific abnormal finding of lung field: Secondary | ICD-10-CM | POA: Diagnosis not present

## 2018-01-12 DIAGNOSIS — C773 Secondary and unspecified malignant neoplasm of axilla and upper limb lymph nodes: Secondary | ICD-10-CM | POA: Insufficient documentation

## 2018-01-12 DIAGNOSIS — C50411 Malignant neoplasm of upper-outer quadrant of right female breast: Secondary | ICD-10-CM | POA: Insufficient documentation

## 2018-01-12 DIAGNOSIS — Z5112 Encounter for antineoplastic immunotherapy: Secondary | ICD-10-CM | POA: Diagnosis present

## 2018-01-12 DIAGNOSIS — C787 Secondary malignant neoplasm of liver and intrahepatic bile duct: Secondary | ICD-10-CM | POA: Insufficient documentation

## 2018-01-12 DIAGNOSIS — J910 Malignant pleural effusion: Secondary | ICD-10-CM | POA: Diagnosis not present

## 2018-01-12 DIAGNOSIS — Z9011 Acquired absence of right breast and nipple: Secondary | ICD-10-CM | POA: Insufficient documentation

## 2018-01-12 DIAGNOSIS — G540 Brachial plexus disorders: Secondary | ICD-10-CM | POA: Diagnosis not present

## 2018-01-12 MED ORDER — OXYCODONE HCL 5 MG PO TABS: 5.0000 mg | ORAL_TABLET | ORAL | 0 refills | Status: AC | PRN

## 2018-01-12 MED ORDER — ALPRAZOLAM 0.25 MG PO TABS: 0.2500 mg | ORAL_TABLET | Freq: Two times a day (BID) | ORAL | 1 refills | Status: AC | PRN

## 2018-01-12 NOTE — Assessment & Plan Note (Signed)
Metastatic breast cancer: On immunotherapy with pembrolizumab After 2 cycles of treatment, patient was hospitalized with profound shortness of breath and a loculated pleural malignant pleural effusion.  Acute visit today for evaluation of worsening shortness of breath Chest x-ray obtained did not show any change in the moderate pleural effusion.  Interstitial changes in the lungs slightly improved.  Recommendation: 1.  Goals of care discussion: Patient wants to be a full code in spite of complete knowledge that there is a possibility that she may not be able to be extubated if she were to go on a ventilator.  The patient is a full code 2. severe brachial plexus pain: I renewed her prescription for oxycodone and give her 120 tablets today.  She is using it every 4 hours as needed. 3.  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 Patient is extremely frail and short of breath even for minimal exertion.  She has very poor prognosis but she wishes to stay aggressive until the very end.

## 2018-01-12 NOTE — Progress Notes (Signed)
Patient Care Team: [name], MD as PCP - General (Family Medicine) Magrinat, Virgie Dad, MD as Consulting Physician (Oncology) Thea Silversmith, MD (Inactive) as Consulting Physician (Radiation Oncology) Mauro Kaufmann, RN as Registered Nurse Rockwell Germany, RN as Registered Nurse Alphonsa Overall, MD as Consulting Physician (General Surgery) Sylvan Cheese, NP as Nurse Practitioner (Hematology and Oncology)  DIAGNOSIS:  Encounter Diagnoses  Name Primary?  . Bone metastases ([place]) Yes  . Malignant neoplasm of upper-outer quadrant of right breast in female, estrogen receptor negative ([place])   . Pleural effusion on left   . Loculated pleural effusion     SUMMARY OF ONCOLOGIC HISTORY:   Breast cancer of upper-outer quadrant of right female breast ([place])   01/19/2015 Mammogram    Right breast: density with indistinct margin at 12:00, posterior depth; also oval mass with obscured margin at 11:00, middle depth; irregular mass with indistinct margin central to nipple, middle depth      01/23/2015 Initial Biopsy    Right breast 9:00 and 10:00 biopsy ([facility]): Grade 3 IDC ER/PR negative, HER-2 negative, Ki-67 77% and 73%; the right axillary lymph node biopsy: ER 22% positive      01/30/2015 Breast MRI    Right breast masses 9 and 10:00 position, additional irregular masses with non-mass enhancement upper outer quadrant with one mass extending into upper inner quadrant, multifocal, multicentric 13.7 x 4.6 x 4.6 cm, malignant right axillary lymph node      02/08/2015 Clinical Stage    Stage IIIA: T3, N1, M0      02/09/2015 Procedure    BreastNext panel revealed VUS BRCA2 gene called p.[id number] (c.3749A>G); otherwise no clinically significant variant at ATM, BARD1, BRCA1, BRCA2, BRIP1, CDH1, CHEK2, MRE11A, MUTYH, NBN, NF1, PALB2, PTEN, RAD50, RAD51C, RAD51D, and TP53      02/15/2015 Imaging    No evidence of metastatic disease, 2.2 cm enhancing lesion anterior left lower kidney  suspicious for solid renal neoplasm      02/20/2015 - 07/10/2015 Neo-Adjuvant Chemotherapy    dose dense doxorubicin and cyclophosphamide x 4 followed by Abraxane weekly x 12      07/10/2015 Breast MRI    Multiple irregular enhancing masses right breast are less confluent and smaller dominant mass 10:00 2.1 x 1.9 x 1.3 cm; right breast 11:00 mass measures 1.2 x 1.2 x 0.8 cm, decreased right axillary lymph node thickening      07/28/2015 Definitive Surgery    Rt mastectomy: Multifocal IDC 2 cm, 1.5 cm. 0.6 cm; 3/13 LN positive, ER/PR HER-2      07/28/2015 Pathologic Stage    Stage IIA: T1c N1 M0        09/03/2015 - 10/13/2015 Radiation Therapy    Adjuvant radiation therapy: Right chest wall/50.4 [place] Cables @ 1.8 Gray per fraction x 28 fractions. Right supraclavicular fossa 45 Gy '@1'$ .8 Gy per fraction x 25 fractions      11/15/2015 - 05/10/2016 Chemotherapy    Adjuvant capecitabine - completed 8 cycles      11/16/2015 Survivorship    Survivorship visit completed and copy of care plan given to patient      02/23/2016 Procedure    Left mandible biopsy: Scant material, no malignancy      05/21/2016 -  Anti-estrogen oral therapy    Patient did not take tamoxifen because her ER was weakly positive at 22% and she was worried about toxicities      02/21/2017 Imaging    Rt Axilla 8 X 7 X 7  mm mass inv pectoralis musculature, additional mass 9 mm 1.5 cm apart      02/21/2017 Relapse/Recurrence    Right axillary mass biopsy: 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      03/24/2017 Miscellaneous    Foundation 1 analysis: Microsatellite stable; TMB 9 muts/Mb; MYC amplification, PIK3C2B p717L; TP53 [id number]      03/25/2017 - 07/15/2017 Chemotherapy    Carboplatin and gemcitabine palliative chemotherapy      07/28/2017 PET scan    Progression of metastatic disease new liver lesion on 0.6 cm SUV 7.2, previous liver lesion 1.4 cm unchanged but shows no activity; scattered hypermetabolic bone lesions  some of which are new, scattered pulmonary nodules stable hypermetabolic right subpectoral and mediastinal/internal mammary nodes stable      08/22/2017 - 09/10/2017 Radiation Therapy    Palliative radiation to brachial plexus concurrently with Xeloda      09/12/2017 - 10/31/2017 Chemotherapy    [place] day 1 day 8 every 3 weeks       12/03/2017 -  Chemotherapy    Pembrolizumab every 3 weeks        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  Came in urgently with a complaint of shortness of breath  INTERVAL HISTORY: Phyllis Pruitt is a 47 year old with above-mentioned history metastatic breast cancer is currently on immunotherapy with pembrolizumab.  She received 2 doses of immunotherapy and was hospitalized after second dose with malignant loculated pleural effusion.  She underwent a Pleurx catheter placement and is currently draining pleural fluid 150 mL every 2 days.  She is profoundly short of breath and uses oxygen 24/7.  And she has to go to the bathroom she gets really short of breath.  There are episodes when she gets so panic from lack of oxygen that it makes her life very miserable.  REVIEW OF SYSTEMS:   Constitutional: Denies fevers, chills or abnormal weight loss Eyes: Denies blurriness of vision Ears, nose, mouth, throat, and face: Denies mucositis or sore throat Respiratory: Severe shortness of breath even at rest Cardiovascular: Denies palpitation, chest discomfort Gastrointestinal:  Denies nausea, heartburn or change in bowel habits Skin: Denies abnormal skin rashes Lymphatics: Denies new lymphadenopathy or easy bruising Neurological:Denies numbness, tingling or new weaknesses Behavioral/Psych: Mood is  stable, no new changes  Extremities: Right arm swelling and pain in the right shoulder  All other systems were reviewed with the patient and are negative.  I have reviewed the past medical history, past surgical history, social history and family history with the patient and they are unchanged from previous note.  ALLERGIES:  is allergic to sulfa antibiotics.  MEDICATIONS:  Current Outpatient Medications  Medication Sig Dispense Refill  . ALPRAZolam (XANAX) 0.25 MG tablet Take 1 tablet (0.25 mg total) by mouth 2 (two) times daily as needed for anxiety. 30 tablet 1  . apixaban (ELIQUIS) 5 MG TABS tablet Take 1 tablet (5 mg total) by mouth 2 (two) times daily. 60 tablet 0  . docusate sodium (COLACE) 100 MG capsule Take 1 capsule (100 mg total) by mouth 2 (two) times daily. 10 capsule 0  . feeding supplement, ENSURE ENLIVE, (ENSURE ENLIVE) LIQD Take 237 mLs by mouth 2 (two) times daily  as needed (If pt requests.). 237 mL 12  . gabapentin (NEURONTIN) 300 MG capsule Take 1 capsule (300 mg total) by mouth 3 (three) times daily. (Patient taking differently: Take 300 mg by mouth 2 (two) times daily. ) 90 capsule 3  . levalbuterol (XOPENEX) 0.63 MG/3ML nebulizer solution Take 3 mLs (0.63 mg total) by nebulization every 6 (six) hours as needed for wheezing or shortness of breath. 3 mL 12  . lidocaine-prilocaine (EMLA) cream Apply to affected area once (Patient taking differently: Apply 1 application topically daily as needed. Pain cream for port) 30 g 3  . Multiple Vitamins-Minerals (MULTIVITAMIN) tablet Take 1 tablet by mouth daily.    . nitroGLYCERIN (NITROSTAT) 0.4 MG SL tablet Place 1 tablet (0.4 mg total) under the tongue every 5 (five) minutes as needed for chest pain (call MD if not better after 2 doses). 20 tablet 12  . oxyCODONE (OXY IR/ROXICODONE) 5 MG immediate release tablet Take 1 tablet (5 mg total) by mouth every 4 (four) hours as needed for severe pain. 120 tablet 0  . pantoprazole  (PROTONIX) 40 MG tablet Take 1 tablet (40 mg total) by mouth daily. 30 tablet 1  . potassium chloride SA (K-DUR,KLOR-CON) 20 MEQ tablet TAKE 1 TABLET(20 MEQ) BY MOUTH DAILY 30 tablet 0  . prochlorperazine (COMPAZINE) 10 MG tablet Take 1 tablet (10 mg total) by mouth every 6 (six) hours as needed for nausea or vomiting. 40 tablet 1  . traZODone (DESYREL) 100 MG tablet Take 1 tablet (100 mg total) by mouth at bedtime as needed for sleep. 30 tablet 0   No current facility-administered medications for this visit.    Facility-Administered Medications Ordered in Other Visits  Medication Dose Route Frequency Provider Last Rate Last Dose  . sodium chloride flush (NS) 0.9 % injection 10 mL  10 mL Intracatheter PRN Nicholas Lose, MD   10 mL at 12/24/17 1729    PHYSICAL EXAMINATION: ECOG PERFORMANCE STATUS: 1 - Symptomatic but completely ambulatory  Vitals:   01/12/18 1407  BP: 118/85  Pulse: (!) 121  Resp: 18  Temp: 98.6 F (37 C)  SpO2: 99%   Filed Weights   01/12/18 1407  Weight: 132 lb (59.9 kg)    GENERAL:alert, no distress and comfortable SKIN: skin color, texture, turgor are normal, no rashes or significant lesions EYES: normal, Conjunctiva are pink and non-injected, sclera clear OROPHARYNX:no exudate, no erythema and lips, buccal mucosa, and tongue normal  NECK: supple, thyroid normal size, non-tender, without nodularity LYMPH:  no palpable lymphadenopathy in the cervical, axillary or inguinal LUNGS: clear to auscultation and percussion with normal breathing effort HEART: regular rate & rhythm and no murmurs and no lower extremity edema ABDOMEN:abdomen soft, non-tender and normal bowel sounds MUSCULOSKELETAL:no cyanosis of digits and no clubbing  NEURO: alert & oriented x 3 with fluent speech, no focal motor/sensory deficits EXTREMITIES: No lower extremity edema BREAST: Severe pain in the right shoulder (exam performed in the presence of a chaperone)  LABORATORY DATA:  I have  reviewed the data as listed CMP Latest Ref Rng & Units 01/07/2018 01/06/2018 01/04/2018  Glucose 65 - 99 mg/dL 85 90 83  BUN 6 - 20 mg/dL '9 12 13  '$ Creatinine 0.44 - 1.00 mg/dL 0.50 0.49 0.55  Sodium 135 - 145 mmol/L 138 140 139  Potassium 3.5 - 5.1 mmol/L 3.9 3.5 3.6  Chloride 101 - 111 mmol/L 101 103 102  CO2 22 - 32 mmol/L '27 29 29  '$ Calcium 8.9 - 10.3  mg/dL 9.2 8.7(L) 8.7(L)  Total Protein 6.5 - 8.1 g/dL - - -  Total Bilirubin 0.3 - 1.2 mg/dL - - -  Alkaline Phos 38 - 126 U/L - - -  AST 15 - 41 U/L - - -  ALT 14 - 54 U/L - - -    Lab Results  Component Value Date   WBC 8.8 01/07/2018   HGB 8.3 (L) 01/07/2018   HCT 25.9 (L) 01/07/2018   MCV 83.0 01/07/2018   PLT 317 01/07/2018   NEUTROABS 7.3 12/29/2017    ASSESSMENT & PLAN:  Breast cancer of upper-outer quadrant of right female breast ([place]) Metastatic breast cancer: On immunotherapy with pembrolizumab After 2 cycles of treatment, patient was hospitalized with profound shortness of breath and a loculated pleural malignant pleural effusion.  Acute visit today for evaluation of worsening shortness of breath 01/12/2018: Chest x-ray obtained did not show any change in the moderate pleural effusion.  Interstitial changes in the lungs slightly improved.  Recommendation: 1.  Goals of care discussion: Patient wants to be a full code in spite of complete knowledge that there is a possibility that she may not be able to be extubated if she were to go on a ventilator.  The patient is a full code 2. severe brachial plexus pain: I renewed her prescription for oxycodone and give her 120 tablets today.  She is using it every 4 hours as needed. 3.  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 Patient is extremely frail and short of breath even for minimal exertion.  She has very poor prognosis but she wishes to stay aggressive until the very end.    Orders Placed This Encounter    Procedures  . DME Nebulizer machine    Order Specific Question:   Patient needs a nebulizer to treat with the following condition    Answer:   Bronchospasm [[id number]]   The patient has a good understanding of the overall plan. she agrees with it. she will call with any problems that may develop before the next visit here.   Harriette Ohara, MD 01/12/18

## 2018-01-12 NOTE — Telephone Encounter (Signed)
Returned pt VM regarding low oxygen saturation readings. Per Dr. [name] he instructed her to have a chest xray and then he will see her in office today. Orders placed and pt scheduled.  Cyndia Bent RN

## 2018-01-12 NOTE — Telephone Encounter (Signed)
Spoke with pt. She has been scheduled with MW on 01/15/18 at 4pm. Advised pt to seek emergency care if her breathing gets worse before this appointment. WHILE SPEAKING TO THE PT, SHE WAS NOT IN DISTRESS. Nothing further was needed.

## 2018-01-13 ENCOUNTER — Telehealth: Payer: Self-pay | Admitting: Hematology and Oncology

## 2018-01-13 ENCOUNTER — Ambulatory Visit (INDEPENDENT_AMBULATORY_CARE_PROVIDER_SITE_OTHER): Payer: 59

## 2018-01-13 ENCOUNTER — Encounter (INDEPENDENT_AMBULATORY_CARE_PROVIDER_SITE_OTHER): Payer: Self-pay | Admitting: Orthopaedic Surgery

## 2018-01-13 ENCOUNTER — Ambulatory Visit (INDEPENDENT_AMBULATORY_CARE_PROVIDER_SITE_OTHER): Payer: 59 | Admitting: Orthopaedic Surgery

## 2018-01-13 ENCOUNTER — Other Ambulatory Visit: Payer: Self-pay | Admitting: Hematology and Oncology

## 2018-01-13 VITALS — BP 102/68 | HR 120 | Resp 16 | Ht 65.5 in | Wt 135.0 lb

## 2018-01-13 DIAGNOSIS — C50919 Malignant neoplasm of unspecified site of unspecified female breast: Secondary | ICD-10-CM

## 2018-01-13 DIAGNOSIS — M25561 Pain in right knee: Secondary | ICD-10-CM

## 2018-01-13 DIAGNOSIS — C7951 Secondary malignant neoplasm of bone: Secondary | ICD-10-CM | POA: Diagnosis not present

## 2018-01-13 DIAGNOSIS — C50411 Malignant neoplasm of upper-outer quadrant of right female breast: Secondary | ICD-10-CM

## 2018-01-13 DIAGNOSIS — Z171 Estrogen receptor negative status [ER-]: Secondary | ICD-10-CM

## 2018-01-13 MED ORDER — LIDOCAINE HCL 1 % IJ SOLN
2.0000 mL | INTRAMUSCULAR | Status: AC | PRN
  Administered 2018-01-13: 2 mL

## 2018-01-13 MED ORDER — METHYLPREDNISOLONE ACETATE 40 MG/ML IJ SUSP
80.0000 mg | INTRAMUSCULAR | Status: AC | PRN
  Administered 2018-01-13: 80 mg

## 2018-01-13 MED ORDER — BUPIVACAINE HCL 0.5 % IJ SOLN
2.0000 mL | INTRAMUSCULAR | Status: AC | PRN
  Administered 2018-01-13: 2 mL via INTRA_ARTICULAR

## 2018-01-13 NOTE — Progress Notes (Signed)
Office Visit Note   Patient: Phyllis Pruitt           Date of Birth: October 30, 1970           MRN: [id number] Visit Date: 01/13/2018              Requested by: [name], MD [address] [employer] [place], [place] [address] PCP: [name], MD   Assessment & Plan: Visit Diagnoses:  1. Acute pain of right knee   2. Bone metastases ([place])   3. Metastatic breast carcinoma [facility])     Plan:  #1: Dr. Durward Fortes has spoken to Dr. [name] directly about Phyllis Pruitt.  He will order a bone scan of the whole body.  Also he has given [place] permission to do a corticosteroid injection to the right knee.  Was accomplished atraumatically. #2: If this is not beneficial then further evaluation may be necessary.  At this time we feel that this may be internal derangement versus metastasis.  Follow-Up Instructions: Return if symptoms worsen or fail to improve.   Orders:  Orders Placed This Encounter  Procedures  . Large Joint Inj  . XR KNEE 3 VIEW RIGHT  . XR HIP UNILAT W OR W/O PELVIS 2-3 VIEWS RIGHT  . XR FEMUR, MIN 2 VIEWS RIGHT   No orders of the defined types were placed in this encounter.     Procedures: Large Joint Inj: R knee on 01/13/2018 12:14 PM Indications: pain and diagnostic evaluation Details: 25 G 1.5 in needle, anteromedial approach  Arthrogram: No  Medications: 2 mL lidocaine 1 %; 2 mL bupivacaine 0.5 %; 80 mg methylPREDNISolone acetate 40 MG/ML Outcome: tolerated well, no immediate complications Procedure, treatment alternatives, risks and benefits explained, specific risks discussed. Consent was given by the patient. Immediately prior to procedure a time out was called to verify the correct patient, procedure, equipment, support staff and site/side marked as required. Patient was prepped and draped in the usual sterile fashion.       Clinical Data: No additional findings.   Subjective: Chief Complaint  Patient presents with  . Right Knee - Pain    R KNEE  PAIN FOR 2 MONTHS NO INJURY  . New Patient (Initial Visit)    R KNEE PAIN 2 MONTHS NO INJURY    HPI  Phyllis Pruitt is a 47 year old African-American female who is seen today for evaluation of pain in her right knee.  Up until recently she has been doing Zumba as well as other exercises.  She does have metastatic breast cancer and has developed some hypoxia secondary to beginning [place].  She is presently on O2.  Did not remember any history of injury or trauma to the right knee.  Her pain is centered mainly along the medial aspect of the knee.  Of interesting note she did try Flexogenic evaluation was refused treatment after x-rays and ultrasound were performed.  She is seen today for evaluation.   Review of Systems  Constitutional: Negative for fatigue and fever.  HENT: Negative for ear pain.   Eyes: Negative for pain.  Cardiovascular: Negative for leg swelling.  Gastrointestinal: Negative for blood in stool, constipation and diarrhea.  Genitourinary: Negative for difficulty urinating.  Musculoskeletal: Negative for back pain and neck pain.  Skin: Negative for rash and wound.  Allergic/Immunologic: Negative for food allergies.  Neurological: Positive for dizziness, weakness and numbness. Negative for light-headedness and headaches.  Hematological: Does not bruise/bleed easily.  Psychiatric/Behavioral: Positive for sleep disturbance.  Objective: Vital Signs: BP 102/68 (BP Location: Left Arm, Patient Position: Sitting, Cuff Size: Normal)   Pulse (!) 120   Resp 16   Ht 5' 5.5" (1.664 m)   Wt 135 lb (61.2 kg)   BMI 22.12 kg/m   Physical Exam  Constitutional: She is oriented to person, place, and time.  HENT:  Head: Normocephalic.  Eyes: Pupils are equal, round, and reactive to light. EOM are normal.  Pulmonary/Chest: She is in respiratory distress ( Mild on oxygen).  Neurological: She is alert and oriented to person, place, and time.  Skin: Skin is warm and dry.  Psychiatric:  She has a normal mood and affect. Her behavior is normal. Judgment and thought content normal.    Ortho Exam  Today she has range of motion from full extension to 100 degrees.  She is tender to palpation along the medial joint line of the right knee.  Trace effusion.  No warmth or erythema.  Ligamentously stable.  Specialty Comments:  No specialty comments available.  Imaging: Dg Chest 2 View  Result Date: 01/12/2018 CLINICAL DATA:  Severe shortness of breath and right shoulder pain. History of metastatic breast carcinoma. EXAM: CHEST - 2 VIEW COMPARISON:  Single-view of the chest 01/05/2018, 01/04/2018 and 01/02/2018. FINDINGS: Left pleural drainage catheter remains in place. Moderate left pleural effusion is unchanged. No pneumothorax is seen on today's examination. Small right pleural effusion is also again identified. Hazy airspace opacities in the right chest persist but have improved. The patient is status post right mastectomy and axillary dissection. Sclerotic bony metastases are better seen on the prior CT. IMPRESSION: No change in a moderate left pleural effusion with a pleural drainage catheter in place. No pneumothorax is identified. Some improvement in hazy airspace opacities in the right chest. Electronically Signed   By: Inge Rise M.D.   On: 01/12/2018 13:53   Xr Femur, Min 2 Views Right  Result Date: 01/13/2018 2 view x-ray of the right femur again reveals a lesion along the lateral aspect of the distal third of the femur.  This may just be a cyst.  Sick changes are noted in the distal femur at the knee.  Xr Hip Unilat W Or W/o Pelvis 2-3 Views Right  Result Date: 01/13/2018 AP pelvis and right hip x-rays reveals reveals questionable lytic lesions in the both femoral heads.  On the right femoral neck there appears to be a lytic lesion in the midportion of the neck and at the base of the neck.  Some elevation at the proximal femur just inferior to the lesser trochanter on the  oblique.  Xr Knee 3 View Right  Result Date: 01/13/2018 Three-view x-ray of the right knee reveals periosteal elevation along the distal medial femur.  There is some some cystic type lesions in the distal third lateral aspect of the femur.  Lytic lesions are also noted both in the distal femoral condyle and proximal tibial plateau.  She does have some calcification in the medial compartment.    PMFS History:  Current Outpatient Medications  Medication Sig Dispense Refill  . ALPRAZolam (XANAX) 0.25 MG tablet Take 1 tablet (0.25 mg total) by mouth 2 (two) times daily as needed for anxiety. 30 tablet 1  . apixaban (ELIQUIS) 5 MG TABS tablet Take 1 tablet (5 mg total) by mouth 2 (two) times daily. 60 tablet 0  . docusate sodium (COLACE) 100 MG capsule Take 1 capsule (100 mg total) by mouth 2 (two) times daily. 10 capsule  0  . gabapentin (NEURONTIN) 300 MG capsule Take 1 capsule (300 mg total) by mouth 3 (three) times daily. 90 capsule 3  . levalbuterol (XOPENEX) 0.63 MG/3ML nebulizer solution Take 3 mLs (0.63 mg total) by nebulization every 6 (six) hours as needed for wheezing or shortness of breath. 3 mL 12  . lidocaine-prilocaine (EMLA) cream Apply to affected area once (Patient taking differently: Apply 1 application topically daily as needed. Pain cream for port) 30 g 3  . Multiple Vitamins-Minerals (MULTIVITAMIN) tablet Take 1 tablet by mouth daily.    . nitroGLYCERIN (NITROSTAT) 0.4 MG SL tablet Place 1 tablet (0.4 mg total) under the tongue every 5 (five) minutes as needed for chest pain (call MD if not better after 2 doses). 20 tablet 12  . oxyCODONE (OXY IR/ROXICODONE) 5 MG immediate release tablet Take 1 tablet (5 mg total) by mouth every 4 (four) hours as needed for severe pain. 120 tablet 0  . pantoprazole (PROTONIX) 40 MG tablet Take 1 tablet (40 mg total) by mouth daily. 30 tablet 1  . potassium chloride SA (K-DUR,KLOR-CON) 20 MEQ tablet TAKE 1 TABLET(20 MEQ) BY MOUTH DAILY 30 tablet 0   . prochlorperazine (COMPAZINE) 10 MG tablet Take 1 tablet (10 mg total) by mouth every 6 (six) hours as needed for nausea or vomiting. 40 tablet 1  . traZODone (DESYREL) 100 MG tablet Take 1 tablet (100 mg total) by mouth at bedtime as needed for sleep. 30 tablet 0  . feeding supplement, ENSURE ENLIVE, (ENSURE ENLIVE) LIQD Take 237 mLs by mouth 2 (two) times daily as needed (If pt requests.). (Patient not taking: Reported on 01/13/2018) 237 mL 12   No current facility-administered medications for this visit.    Facility-Administered Medications Ordered in Other Visits  Medication Dose Route Frequency Provider Last Rate Last Dose  . sodium chloride flush (NS) 0.9 % injection 10 mL  10 mL Intracatheter PRN Nicholas Lose, MD   10 mL at 12/24/17 1729     Patient Active Problem List   Diagnosis Date Noted  . Loculated pleural effusion 01/01/2018  . Radiation pneumonitis ([place]) 01/01/2018  . Tachycardia 12/31/2017  . Acute respiratory failure with hypoxia ([place]) 12/29/2017  . Pleural effusion on left   . Esophageal spasm 12/06/2017  . Bone metastases ([place]) 11/18/2017  . Liver metastases ([place]) 11/18/2017  . Port-A-Cath in place 11/13/2017  . Acquired absence of breast and nipple 06/03/2016  . Renal cell carcinoma of left kidney ([place]) 02/06/2016  . Left ovarian cyst 01/15/2016  . Renal mass 12/29/2015  . Chemotherapy-induced peripheral neuropathy ([place]) 08/04/2015  . Metastatic breast carcinoma ([place]) 07/28/2015  . Genetic testing 03/07/2015  . Left renal mass 02/17/2015  . Breast cancer of upper-outer quadrant of right female breast ([place]) 01/27/2015   Past Medical History:  Diagnosis Date  . Anemia    during pregnancy  . Breast cancer of upper-outer quadrant of right female breast ([place]) 01/27/2015  . Depression    hx denies any problems now  . History of DVT in adulthood    On Xarelto  . History of kidney cancer 12/2015   left removed "spot"  . Lymphedema    rt arm  . Neuropathy     . Sickle cell trait ([place])     History reviewed. No pertinent family history.  Past Surgical History:  Procedure Laterality Date  . IR GUIDED DRAIN W CATHETER PLACEMENT  12/29/2017  . LATISSIMUS FLAP TO BREAST Right 06/03/2016   Procedure: RIGHT LATISSIMUS  FLAP TO BREAST;  Surgeon: Irene Limbo, MD;  Location: [facility];  Service: Plastics;  Laterality: Right;  . LIPOSUCTION WITH LIPOFILLING Right 09/20/2016   Procedure: LIPOSUCTION WITH LIPOFILLING;  Surgeon: Irene Limbo, MD;  Location: [facility];  Service: Plastics;  Laterality: Right;  LIPOSUCTION WITH LIPOFILLING  . MASTECTOMY    . MASTECTOMY MODIFIED RADICAL Right 07/28/2015  . MASTECTOMY MODIFIED RADICAL Right 07/28/2015   Procedure: RIGHT MODIFIED RADICAL MASTECTOMY;  Surgeon: Alphonsa Overall, MD;  Location: [facility];  Service: General;  Laterality: Right;  . PORT-A-CATH REMOVAL  07/28/2015  . PORT-A-CATH REMOVAL  07/28/2015   Procedure: REMOVAL PORT-A-CATH;  Surgeon: Alphonsa Overall, MD;  Location: [facility];  Service: General;;  . PORTACATH PLACEMENT N/A 02/10/2015   Procedure: INSERTION PORT-A-CATH WITH ULTRA SOUND, left subclavian,;  Surgeon: Alphonsa Overall, MD;  Location: [facility];  Service: General;  Laterality: N/A;  . PORTACATH PLACEMENT N/A 04/04/2017   Procedure: POWER PORT PLACEMENT;  Surgeon: Alphonsa Overall, MD;  [facility]: [facility];  Service: General;  Laterality: N/A;  . REMOVAL OF TISSUE EXPANDER AND PLACEMENT OF IMPLANT Right 09/20/2016   Procedure: REMOVAL OF RIGHT TISSUE EXPANDER WITH PLACEMENT OF RIGHT SILICONE BREAST IMPLANTS LIPOFILLING FROM ABDOMEN TO RIGHT CHEST;  Surgeon: Irene Limbo, MD;  Location: [facility];  Service: Plastics;  Laterality: Right;  PLACEMENT OF RIGHT SILICONE BREAST IMPLANTS LIPOFILLING FROM ABDOMEN TO RIGHT CHEST  . ROBOTIC ASSITED PARTIAL NEPHRECTOMY Left 12/29/2015   Procedure: XI ROBOTIC ASSITED PARTIAL NEPHRECTOMY;  Surgeon: Cleon Gustin, MD;  Location: [facility];   Service: Urology;  Laterality: Left;  . TISSUE EXPANDER PLACEMENT Right 06/03/2016   Procedure: PLACEMENT RIGHT TISSUE EXPANDER;  Surgeon: Irene Limbo, MD;  Location: [facility];  Service: Plastics;  Laterality: Right;  . TONSILLECTOMY    . TUBAL LIGATION    . wisdome teeth extraction     Social History   Occupational History  . Not on file  Tobacco Use  . Smoking status: Never Smoker  . Smokeless tobacco: Never Used  Substance and Sexual Activity  . Alcohol use: No  . Drug use: No  . Sexual activity: Not on file

## 2018-01-13 NOTE — Telephone Encounter (Signed)
Scheduled appt per 4/2 sch message- scheduled bone scan with Peggy in central radiology and added lab appt per 4/2 sch message - left message with appt date and time.

## 2018-01-14 ENCOUNTER — Ambulatory Visit: Payer: 59

## 2018-01-14 ENCOUNTER — Ambulatory Visit: Payer: 59 | Admitting: Hematology and Oncology

## 2018-01-14 ENCOUNTER — Other Ambulatory Visit: Payer: 59

## 2018-01-15 ENCOUNTER — Ambulatory Visit (INDEPENDENT_AMBULATORY_CARE_PROVIDER_SITE_OTHER): Payer: 59 | Admitting: Internal Medicine

## 2018-01-15 ENCOUNTER — Other Ambulatory Visit: Payer: 59

## 2018-01-15 ENCOUNTER — Encounter: Payer: Self-pay | Admitting: Internal Medicine

## 2018-01-15 ENCOUNTER — Other Ambulatory Visit (INDEPENDENT_AMBULATORY_CARE_PROVIDER_SITE_OTHER): Payer: 59

## 2018-01-15 VITALS — BP 120/86 | HR 129 | Ht 65.5 in | Wt 137.2 lb

## 2018-01-15 DIAGNOSIS — J9611 Chronic respiratory failure with hypoxia: Secondary | ICD-10-CM | POA: Diagnosis not present

## 2018-01-15 DIAGNOSIS — J700 Acute pulmonary manifestations due to radiation: Secondary | ICD-10-CM | POA: Diagnosis not present

## 2018-01-15 DIAGNOSIS — R058 Other specified cough: Secondary | ICD-10-CM

## 2018-01-15 DIAGNOSIS — R05 Cough: Secondary | ICD-10-CM

## 2018-01-15 DIAGNOSIS — J90 Pleural effusion, not elsewhere classified: Secondary | ICD-10-CM | POA: Diagnosis not present

## 2018-01-15 LAB — SEDIMENTATION RATE: Sed Rate: 113 mm/hr — ABNORMAL HIGH (ref 0–20)

## 2018-01-15 MED ORDER — FAMOTIDINE 20 MG PO TABS: ORAL_TABLET | ORAL | 11 refills | Status: AC

## 2018-01-15 NOTE — Patient Instructions (Addendum)
Pantoprazole (protonix) 40 mg   Take  30-60 min before first meal of the day and Pepcid (famotidine)  20 mg one @  bedtime until return to office - this is the best way to tell whether stomach acid is contributing to your problem.    GERD (REFLUX)  is an extremely common cause of respiratory symptoms just like yours , many times with no obvious heartburn at all.    It can be treated with medication, but also with lifestyle changes including elevation of the head of your bed (ideally with 6 inch  bed blocks),  Smoking cessation, avoidance of late meals, excessive alcohol, and avoid fatty foods, chocolate, peppermint, colas, red wine, and acidic juices such as orange juice.  NO MINT OR MENTHOL PRODUCTS SO NO COUGH DROPS   USE SUGARLESS CANDY INSTEAD (Jolley ranchers or Stover's or Life Savers) or even ice chips will also do - the key is to swallow to prevent all throat clearing. NO OIL BASED VITAMINS - use powdered substitutes.    Please remember to go to the lab department downstairs in the basement  for your tests - we will call you with the results when they are available.  02 2lpm 24/7 but increase to 4lpm with activity   Please schedule a follow up office visit in 4 weeks, sooner if needed

## 2018-01-15 NOTE — Progress Notes (Signed)
Subjective:     Patient ID: Phyllis Pruitt, female   DOB: 1970/11/10,     MRN: [id number]  HPI  61 yobf never smoker s/p admit:  Admit date: 12/29/2017 Discharge date: 01/07/2018    Home Health:yes Equipment/Devices:pleurex.   Discharge Condition:stable.  CODE STATUS: full code.  Diet recommendation: Heart Healthy /  Brief/Interim Summary: Patient is a 47 year old African-American female with past medical history of stage IV breast cancer with recurrent left-sided pleural effusion .She was admitted for worsening shortness of breath. She was found to have acute hypoxemic respiratory failure on presentation. Patient is status post thoracocentesis and underwent left-sided Pleurx catheter placement by IR. Discharge Diagnoses:  Principal Problem:   Acute respiratory failure with hypoxia (HCC) Active Problems:   Breast cancer of upper-outer quadrant of right female breast ([facility])   Pleural effusion on left   Tachycardia   Loculated pleural effusion   Radiation pneumonitis ([facility])  Acute respiratory failure with hypoxia Secondary to pleural effusion, pneumonia and hydropneumothorax. Patient remains stable. Oxygenating well with supplemental oxygen. She was taken off of oxygen this morning and saturating in the mid 90s on room air at rest. Discharge with oxygen.   Left-sided complex/loculated malignant pleural effusion She is status post Pleurx catheter placement. Patient continues to have hydropneumothorax. She has trapped lung which is the reason for the appearance on imaging studies. Patient not a surgical candidate per pulmonology. Pleurx catheter has been drained daily for the last 4 days. Patient was initially started on vancomycin and cefepime. She was taken off of vancomycin 3 days prior to d/c. Remained on cefepime and last dose to be administered 01/08/18  No positive cultures noted. Her metastatic cancer is also contributing. Plan for discharge home with Pleurx  catheter. Patient will need home health.  Radiation pneumonitis Started on steroids by pulmonology which is being continued. Currently on prednisone. This will need to be tapered slowly per pulmonology.  Sinus tachycardia Most likely due to respiratory issues. Echocardiogram showed normal systolic function. TSH was normal recently. much improved on discharge. 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 This was diagnosed at [facility] [place] in [place]. She mentioned that her right arm seemed more swollen. She had been on Xarelto previously. Switched over to Eliquis per patient preference. Doppler studies done one day prior to d/c  findings suggestive of chronic thrombus. Will not to recommend any changes to treatment at this time. Keep arm elevated as much as possible. continue with eliquis on discharge.   Right breast cancer Followed by Dr. [name]. Has been getting immunotherapy. Based on his last note her disease appears to be getting worse which indicates poor prognosis. Patient however wants to pursue all options for now.   Essential hypertension Better controlled.   Anemia of chronic disease Hemoglobin has been stable. No evidence for overt bleeding. Continue to check periodically.  Anxiety  Xanax as needed.     01/15/2018 1st [place] [facility] office visit/ Wert   Chief Complaint  Patient presents with  . Consult    Referred by Elvina Sidle after pt was admitted to the hospital x10 days due to fluid in lungs. Pt has complaints of SOB that is worse with exertion, cough, and chest tightness. Pt states she was on O2 4 days before admission to the hospital.  DME: [facility], 2L   Last 12/01/17  RT completed Last drain was 50 cc 01/13/18 husband drains vs 125 cc    Sleeping in  45 degrees in recliner since around 12/28/17  When leaves home uses w/c Crossing the room 02 sats drops even 2lpm  Oxycodone every 5-6h for pain, not sob  Assoc  cough is dry / gen sense of chest tightness esp at hs/ assoc overt HB   No obvious day to day or daytime variability or assoc excess/ purulent sputum or mucus plugs or hemoptysis   subjective wheeze or overt sinus   symptoms. No unusual exposure hx or h/o childhood pna/ asthma or knowledge of premature birth.    Also denies any obvious fluctuation of symptoms with weather or environmental changes or other aggravating or alleviating factors except as outlined above   Current Allergies, Complete Past Medical History, Past Surgical History, Family History, and Social History were reviewed in [employer] record.  ROS  The following are not active complaints unless bolded Hoarseness, sore throat, dysphagia, dental problems, itching, sneezing,  nasal congestion or discharge of excess mucus or purulent secretions, ear ache,   fever, chills, sweats, unintended wt loss or wt gain, classically pleuritic or exertional cp,  orthopnea pnd or leg swelling, presyncope, palpitations, abdominal pain, anorexia, nausea, vomiting, diarrhea  or change in bowel habits or change in bladder habits, change in stools or change in urine, dysuria, hematuria,  rash, arthralgias, visual complaints, headache, numbness, weakness or ataxia or problems with walking or coordination,  change in mood/affect or memory.        Current Meds  Medication Sig  . ALPRAZolam (XANAX) 0.25 MG tablet Take 1 tablet (0.25 mg total) by mouth 2 (two) times daily as needed for anxiety.  Marland Kitchen apixaban (ELIQUIS) 5 MG TABS tablet Take 1 tablet (5 mg total) by mouth 2 (two) times daily.  Marland Kitchen docusate sodium (COLACE) 100 MG capsule Take 1 capsule (100 mg total) by mouth 2 (two) times daily.  . feeding supplement, ENSURE ENLIVE, (ENSURE ENLIVE) LIQD Take 237 mLs by mouth 2 (two) times daily as needed (If pt requests.).  Marland Kitchen gabapentin (NEURONTIN) 300 MG capsule Take 1 capsule (300 mg total) by mouth 3 (three) times daily.  Marland Kitchen levalbuterol  (XOPENEX) 0.63 MG/3ML nebulizer solution Take 3 mLs (0.63 mg total) by nebulization every 6 (six) hours as needed for wheezing or shortness of breath.  . lidocaine-prilocaine (EMLA) cream Apply to affected area once (Patient taking differently: Apply 1 application topically daily as needed. Pain cream for port)  . Multiple Vitamins-Minerals (MULTIVITAMIN) tablet Take 1 tablet by mouth daily.  Marland Kitchen oxyCODONE (OXY IR/ROXICODONE) 5 MG immediate release tablet Take 1 tablet (5 mg total) by mouth every 4 (four) hours as needed for severe pain.  . pantoprazole (PROTONIX) 40 MG tablet Take 1 tablet (40 mg total) by mouth daily.  . potassium chloride SA (K-DUR,KLOR-CON) 20 MEQ tablet TAKE 1 TABLET(20 MEQ) BY MOUTH DAILY           Review of Systems     Objective:   Physical Exam  w/c bound somber bf nad    Wt Readings from Last 3 Encounters:  01/15/18 137 lb 3.2 oz (62.2 kg)  01/13/18 135 lb (61.2 kg)  01/12/18 132 lb (59.9 kg)     Vital signs reviewed - Note on arrival 02 sats  98% on 2lpm      HEENT: nl dentition, turbinates bilaterally, and oropharynx.     NECK :  without JVD/Nodes/TM/ nl carotid upstrokes bilaterally   LUNGS: no acc muscle use,  Nl contour chest with decreased bs on L  with dullness about a third up   CV:  RRR  no s3 or murmur or increase in P2, and  No def pitting edema, wearing tight elastic hose   ABD:  soft and nontender with nl inspiratory excursion.. No bruits or organomegaly appreciated, bowel sounds nl  MS:    ext warm without deformities, calf tenderness, cyanosis or clubbing No obvious joint restrictions   SKIN: warm and dry without lesions    NEURO:  alert, approp, nl sensorium with  no obvious motor or cerebellar deficits apparent.      I personally reviewed images and agree with radiology impression as follows:  CXR:   01/12/18 No change in a moderate left pleural effusion with a pleural drainage catheter in place. No pneumothorax is  identified.  Some improvement in hazy airspace opacities in the right chest.  Labs ordered 01/15/2018   ESR   Assessment:

## 2018-01-16 ENCOUNTER — Encounter (HOSPITAL_COMMUNITY): Payer: 59

## 2018-01-16 ENCOUNTER — Encounter: Payer: Self-pay | Admitting: Internal Medicine

## 2018-01-16 ENCOUNTER — Other Ambulatory Visit: Payer: Self-pay | Admitting: Radiation Oncology

## 2018-01-16 ENCOUNTER — Other Ambulatory Visit: Payer: 59

## 2018-01-16 ENCOUNTER — Telehealth: Payer: Self-pay | Admitting: Internal Medicine

## 2018-01-16 DIAGNOSIS — J9611 Chronic respiratory failure with hypoxia: Secondary | ICD-10-CM | POA: Insufficient documentation

## 2018-01-16 DIAGNOSIS — R05 Cough: Secondary | ICD-10-CM | POA: Insufficient documentation

## 2018-01-16 DIAGNOSIS — R058 Other specified cough: Secondary | ICD-10-CM | POA: Insufficient documentation

## 2018-01-16 MED ORDER — LEVOFLOXACIN 500 MG PO TABS: 500.0000 mg | ORAL_TABLET | Freq: Every day | ORAL | 0 refills | Status: AC

## 2018-01-16 NOTE — Telephone Encounter (Signed)
lmtcb for pt.  

## 2018-01-16 NOTE — Telephone Encounter (Signed)
I spoke to Phyllis Pruitt's husband.  He reports no cough, no belly pain, no dyspnea, no diarrhea.  Fever has been gradually worsening.  I explained that I think it is reasonable for her to take an antibiotic but if she has any worsening: development of cough, dyspnea, or other symptoms then they need to go to the ER immediately.  Will call in a prescription for Levaquin 500mg  daily x 5 days.

## 2018-01-16 NOTE — Progress Notes (Signed)
Spoke with pt and notified of results per Dr. Wert. Pt verbalized understanding and denied any questions. 

## 2018-01-16 NOTE — Telephone Encounter (Signed)
Patient was on the phone with me when Caryl Pina attempted to call. Patient was here to see MW yesterday and now is running a fever of 101. Patient has cancer and had talked about possible prednisone with MW but he was going to confer with oncology first. Patient and spouse now asking if she can have an antibiotic.   BQ please advise.

## 2018-01-16 NOTE — Assessment & Plan Note (Signed)
D/c on 02 01/07/18   As of 01/15/2018 rec 2lpm 24/7  - ok to increase to 4lpm with activity

## 2018-01-16 NOTE — Assessment & Plan Note (Signed)
Lab Results  Component Value Date   ESRSEDRATE 113 (H) 01/15/2018     Sed rate is non specific but in the setting of recent RT is c/w component of radiation pneumonitis and pt does report being much better while on prednisone than off so I would consider restarting at 40 mg daily and slowly weaning over 4-6 weeks guided by clinical picture and perhaps by ES ( a level < 40 would suggest this is responding if correlating with clinical improvement)  - defer final call to oncology.

## 2018-01-16 NOTE — Assessment & Plan Note (Signed)
The noct cough may be related to GERD > rec max acid suppression/ diet x 4 week trial  And then f/u here to regroup    I had an extended discussion with the patient/husband(very supportive) reviewing all relevant studies completed to date and  lasting 25 minutes of a 40  minute office visit with extremely complex pt new to me with    severe non-specific but potentially very serious refractory respiratory symptoms of uncertain and potentially multiple  etiologies.   Each maintenance medication was reviewed in detail including most importantly the difference between maintenance and prns and under what circumstances the prns are to be triggered using an action plan format that is not reflected in the computer generated alphabetically organized AVS.    Please see AVS for specific instructions unique to this office visit that I personally wrote and verbalized to the the pt in detail and then reviewed with pt  by my nurse highlighting any changes in therapy/plan of care  recommended at today's visit.

## 2018-01-16 NOTE — Assessment & Plan Note (Addendum)
ECHO 3/19 >> EF 55 to 60% Lt pleural fluid 3/20 >> glucose 30, WBC 791 (68% neutrophils) CT chest 3/22 >> mod Lt effusion, 35% Lt PTX, RUL and superior segment RLL GGO, metastatic deposits in liver, lytic/sclerotic bone lesions    L pleurex placed 12/29/17   This problem appears about the same clinically  and the amt of drainage is declining but the underlying tumor will likely progress and nothing else to offer at this point due to severity of loculations  unless Dr [name] wants to seek second opinion from T surgery

## 2018-01-20 ENCOUNTER — Telehealth: Payer: Self-pay

## 2018-01-20 NOTE — Telephone Encounter (Signed)
Received vm from pt regarding cancelling appt for tomorrow's infusion. Pt is currently admitted in [place] for a few days. Pt seeing Dr.Mcknight (oncology) in [place].Notified Dr.Gudena and is aware.

## 2018-01-20 NOTE — Progress Notes (Deleted)
This case was discussed in our multidisciplinary supportive care conference: She has significant right brachial plexus area pain and dysfunction: - Refer to Spiritual Care to have detailed discussion around goals of care based on patient beliefs and personal preferences embedded in [place] visit

## 2018-01-21 ENCOUNTER — Ambulatory Visit: Payer: 59

## 2018-01-21 ENCOUNTER — Other Ambulatory Visit: Payer: 59

## 2018-01-21 ENCOUNTER — Ambulatory Visit: Payer: 59 | Admitting: Hematology and Oncology

## 2018-01-27 ENCOUNTER — Encounter: Payer: Self-pay | Admitting: *Deleted

## 2018-01-27 NOTE — Progress Notes (Signed)
0930 Returned call to [place] in [place] from [place] requesting treatment summary and last office visit note for Dr. Johnsie Kindred I spoke with [facility] RN.  Shona Simpson PA-C made aware of the request. [phone] The above mentioned information was send to fax. number [phone].

## 2018-02-04 ENCOUNTER — Telehealth: Payer: Self-pay

## 2018-02-04 NOTE — Telephone Encounter (Signed)
Received vm from pt husband. Called pt husband back with no answer. Went straight to vm. Left message with phone number to call back.

## 2018-02-05 ENCOUNTER — Telehealth: Payer: Self-pay

## 2018-02-05 NOTE — Telephone Encounter (Signed)
Received call from pt husband today to report that Nivedita has passed away last 01-20-23. Pt husband was very emotional over the phone. Gave pt husband comfort and consolation. Remo Lipps states that the pt wanted to seek options at [facility] [place] in [place] a few weeks ago, and was admitted for a few days prior to passing.   Pt husband and family are very grateful for Dr.Gudena's care and the cancer center staff. Will cancel any future appts and told husband to call if he has any further questions/concerns.

## 2018-02-11 DEATH — deceased

## 2018-02-16 ENCOUNTER — Ambulatory Visit: Payer: 59 | Admitting: Internal Medicine

## 2018-02-23 ENCOUNTER — Ambulatory Visit: Payer: 59 | Admitting: Internal Medicine

## 2019-03-21 IMAGING — MR MR LUMBAR SPINE WO/W CM
6 of 16 series · 17 of 48 positions shown · IV contrast (multihance)
Comparison: CT scans dated 11/04/2017 and PET-CT dated 07/28/2017

CLINICAL DATA: Back pain.  Metastatic breast cancer.

EXAM:
MRI THORACIC AND LUMBAR SPINE WITHOUT AND WITH CONTRAST
TECHNIQUE: Multiplanar and multiecho pulse sequences of the thoracic and lumbar
spine were obtained without and with intravenous contrast.
CONTRAST:  13mL MULTIHANCE GADOBENATE DIMEGLUMINE 529 MG/ML IV SOLN

[Series 5: T1 · sagittal · 3.0mm · 0.66mm/px · 2 of 13 slices shown (1 of 2)]
[im 1/13]
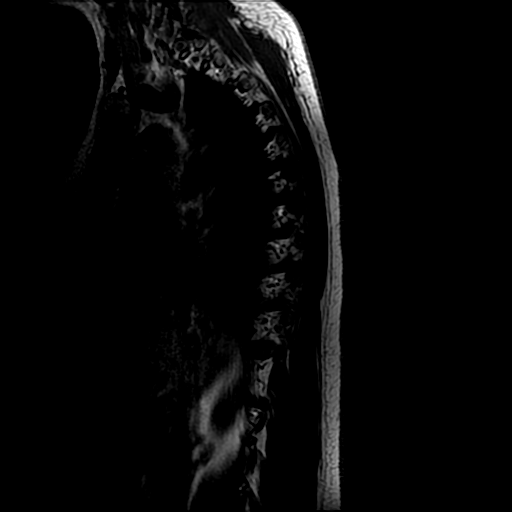
[im 13/13]
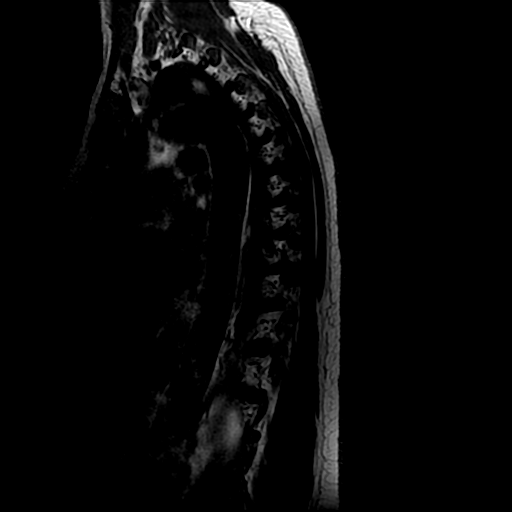

[Series 7: T2 · sagittal · 3.0mm · 0.66mm/px · 2 of 13 slices shown (1 of 4)]
[im 1/13]
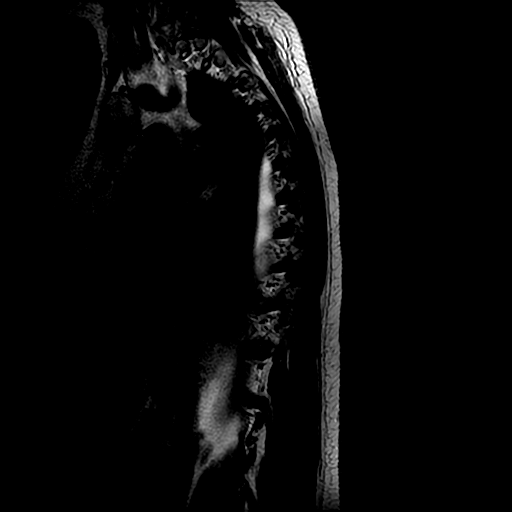
[im 13/13]
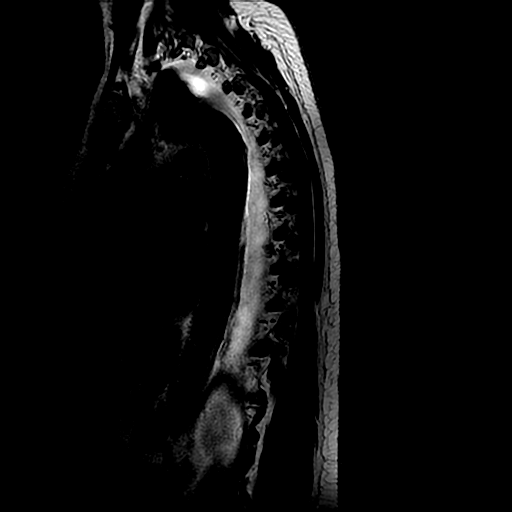

[Series 8: T2 · axial · 4.0mm · 0.39mm/px · z∈[-256,-56]mm · 5 of 43 slices shown (2 of 4)]
[im 1/43]
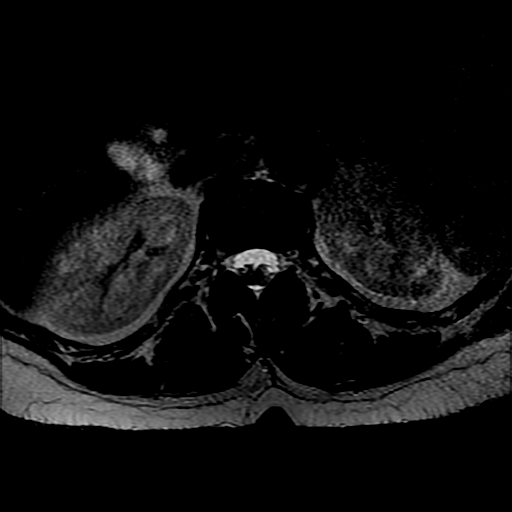
[im 11/43]
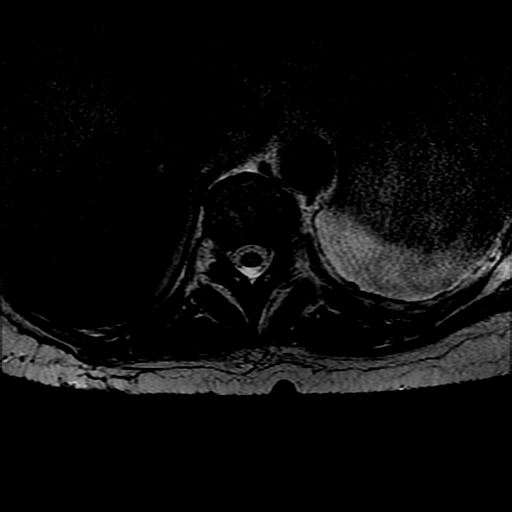
[im 22/43]
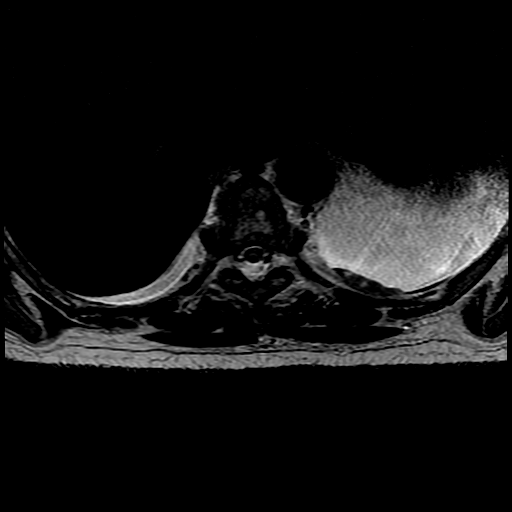
[im 32/43]
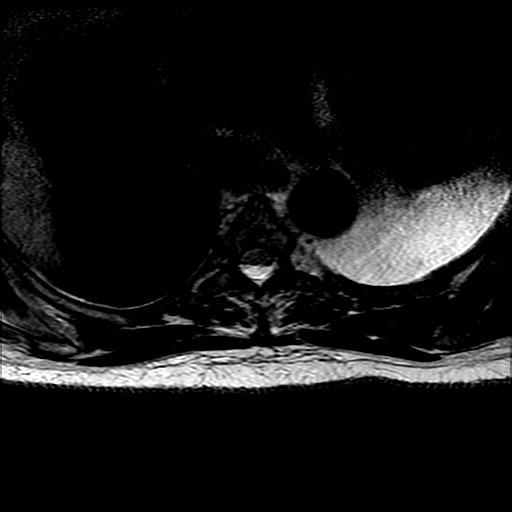
[im 43/43]
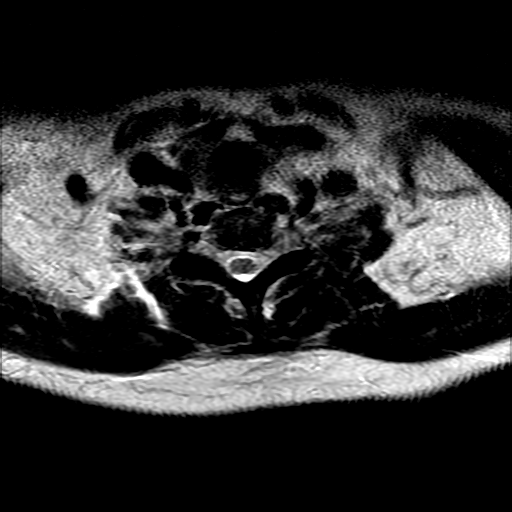

[Series 10: T1 · axial · non-contrast · 4.0mm · 0.39mm/px · z∈[-256,-202]mm · 2 of 43 slices shown (2 of 2)]
[im 1/43]
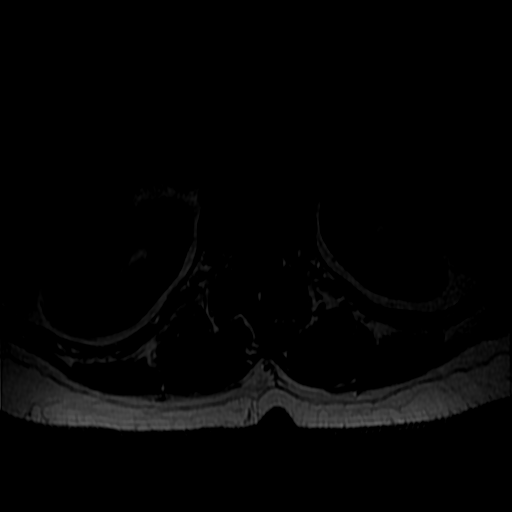
[im 11/43]
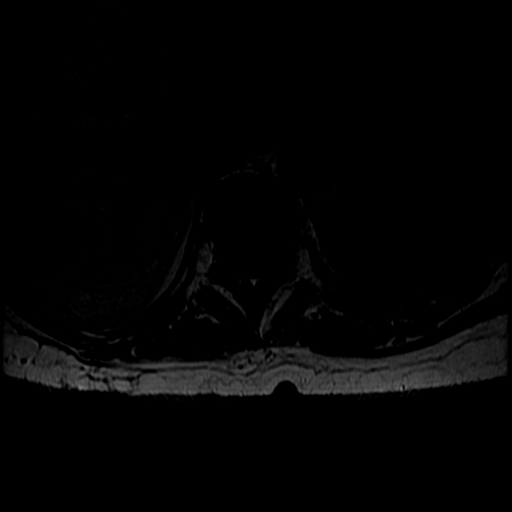

[Series 14: T2 · sagittal · 4.0mm · 0.51mm/px · 1 of 12 slices shown (3 of 4)]
[im 1/12]
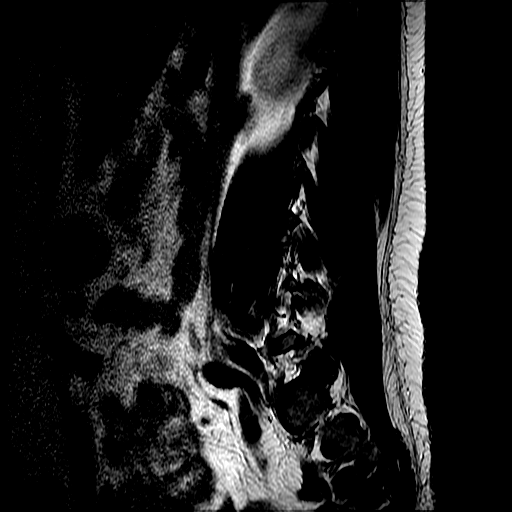

[Series 16: T2 · axial · 4.0mm · 0.39mm/px · z∈[-462,-251]mm · 5 of 39 slices shown (4 of 4)]
[im 1/39]
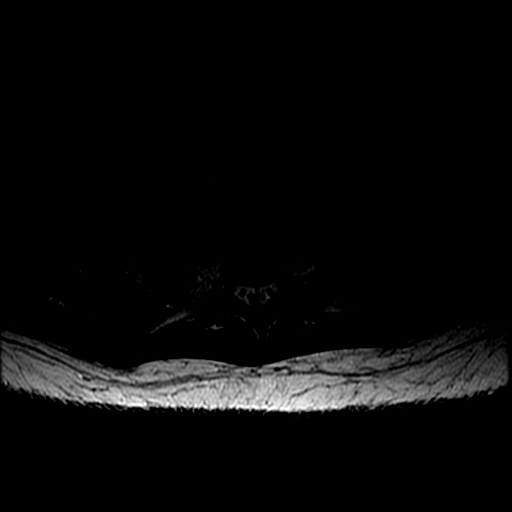
[im 10/39]
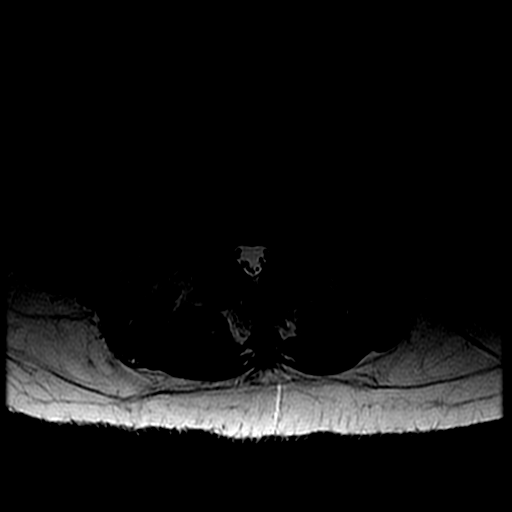
[im 20/39]
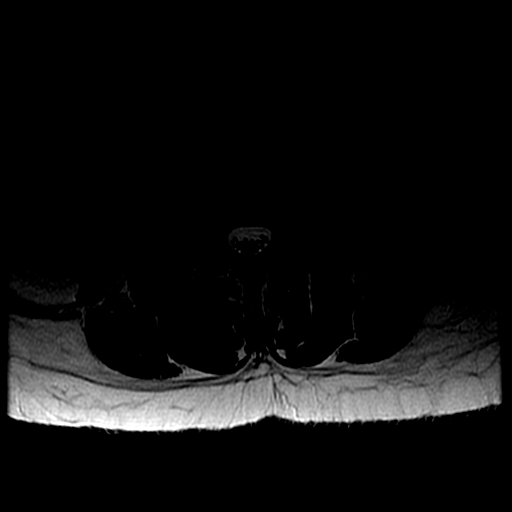
[im 29/39]
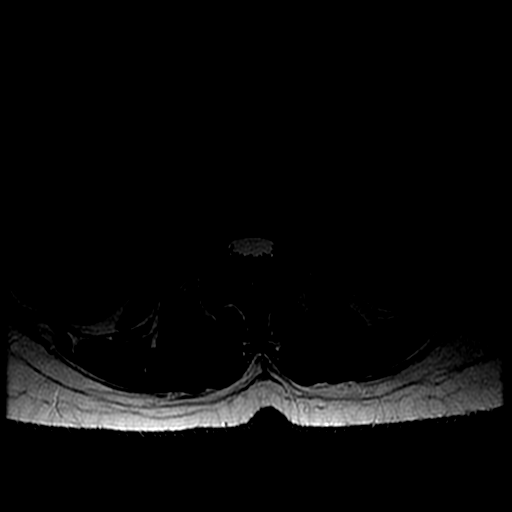
[im 39/39]
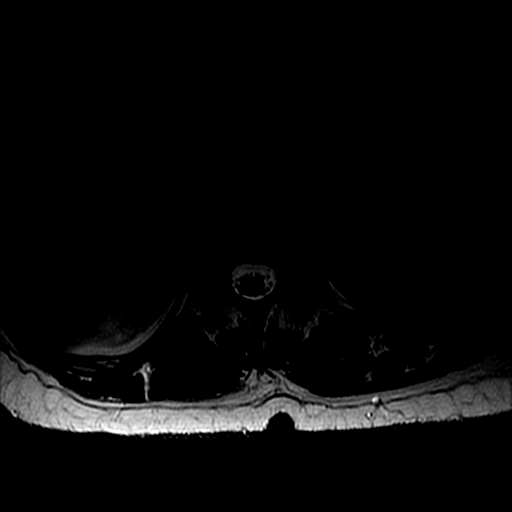

[17 of 48 positions shown; findings below may reference images not displayed]

FINDINGS: MRI THORACIC SPINE FINDINGS

Alignment:  Physiologic.

Vertebrae: There is metastatic disease involving the entire thoracic
spine with the most extensive metastases being and T6 through T9. At
those levels there is more extensive involvement of the vertebral
bodies and posterior elements than at other levels. However, there
is no discrete pathologic fracture. After contrast administration
there is epidural enhancement posteriorly at T5 through T8 without
mass effect upon the thecal sac. There is no spinal cord
compression.

Cord: No mass lesion or myelopathy or spinal cord compression.

Paraspinal and other soft tissues: Tiny right pleural effusion.
Moderate left effusion, increased since the prior CT scan of
11/04/2017. Radiation fibrosis in the upper lobe of the right lung.

Disc levels:

The discs throughout the thoracic spine appear normal. No spinal or
foraminal stenosis.

MRI LUMBAR SPINE FINDINGS

Segmentation:  Standard.

Alignment:  Physiologic.

Vertebrae: Metastatic disease throughout the lumbar spine. No
pathologic fractures. Enhancing tumor involves the second and third
sacral segments with a tumor mass extending into the posterior
aspect of the distal sacral canal at S3 and S4 asymmetric to the
left.

Conus medullaris: Extends to the L1-2 level and appears normal.

Paraspinal and other soft tissues: Negative.

Disc levels:

L1-2 and L2-3: Normal discs. Metastatic disease in the vertebra. No
extension of tumor into the spinal canal.

L3-4: Tiny broad-based disc bulge. Metastatic disease in the
vertebral body and posterior elements. Slight facet arthritis
bilaterally.

L4-5: Tiny broad-based disc bulge without neural impingement.
Metastatic disease infiltrates the left superior facet of L5. No
extension into the spinal canal.

L5-S1 focal disc protrusion paracentral and to the left with a
slight mass effect upon the left S1 nerve. No tumor extension into
the spinal canal. Enhancing tumor in the spinous process of L5.

Sacrum: Soft tissue mass involving the posterior aspect of the S3
segment of the sacrum extending into the left side of the sacral
canal.
IMPRESSION: 1. Diffuse metastatic disease involving the entire thoracic and
lumbar spine without pathologic fractures.
2. Soft tissue mass in the posterior aspect of the S3 and S4
segments of the sacrum does extend into the left side of the sacral
spinal canal.
3. Small focal disc protrusion at L5-S1 to the left with a slight
mass effect upon the left S1 nerve root sleeve.
4. Tumor is most extensive in the midthoracic spine particularly
involving the posterior elements from T5-T8. Abnormal epidural
enhancement posteriorly at those levels probably represents tumor in
the spinal canal. However, there is no mass effect upon the thecal
sac or spinal cord.

## 2023-08-11 ENCOUNTER — Encounter: Payer: Self-pay | Admitting: Genetic Counselor

## 2023-08-11 ENCOUNTER — Telehealth: Payer: Self-pay | Admitting: Genetic Counselor

## 2023-08-11 NOTE — Telephone Encounter (Signed)
LM on VM that we had some updated results on genetic testing that his wife Kenyada had in 2016.  Asked that he please call me back and left CB instructions.
